# Patient Record
Sex: Female | Born: 1945 | Race: White | Hispanic: No
Health system: Southern US, Community
[De-identification: ages and names within clinical notes are randomized; demographics above are authoritative.]

## PROBLEM LIST (undated history)

## (undated) DIAGNOSIS — I4581 Long QT syndrome: Secondary | ICD-10-CM

## (undated) DIAGNOSIS — I10 Essential (primary) hypertension: Secondary | ICD-10-CM

## (undated) DIAGNOSIS — K219 Gastro-esophageal reflux disease without esophagitis: Secondary | ICD-10-CM

## (undated) DIAGNOSIS — I719 Aortic aneurysm of unspecified site, without rupture: Secondary | ICD-10-CM

## (undated) DIAGNOSIS — Z950 Presence of cardiac pacemaker: Secondary | ICD-10-CM

## (undated) DIAGNOSIS — E039 Hypothyroidism, unspecified: Secondary | ICD-10-CM

## (undated) DIAGNOSIS — I498 Other specified cardiac arrhythmias: Secondary | ICD-10-CM

## (undated) DIAGNOSIS — R001 Bradycardia, unspecified: Secondary | ICD-10-CM

## (undated) DIAGNOSIS — R7401 Elevation of levels of liver transaminase levels: Secondary | ICD-10-CM

## (undated) DIAGNOSIS — I099 Rheumatic heart disease, unspecified: Secondary | ICD-10-CM

## (undated) DIAGNOSIS — F419 Anxiety disorder, unspecified: Secondary | ICD-10-CM

## (undated) HISTORY — PX: CARDIAC ELECTROPHYSIOLOGY STUDY AND ABLATION: SHX1294

## (undated) HISTORY — DX: Gastro-esophageal reflux disease without esophagitis: K21.9

## (undated) HISTORY — DX: Elevation of levels of liver transaminase levels: R74.01

## (undated) HISTORY — PX: OOPHORECTOMY: SHX86

## (undated) HISTORY — PX: TONSILLECTOMY: SUR1361

## (undated) HISTORY — PX: SALPINGECTOMY: SHX328

## (undated) HISTORY — DX: Long QT syndrome: I45.81

## (undated) HISTORY — DX: Essential (primary) hypertension: I10

## (undated) HISTORY — DX: Hypothyroidism, unspecified: E03.9

## (undated) HISTORY — DX: Aortic aneurysm of unspecified site, without rupture: I71.9

## (undated) HISTORY — PX: CHOLECYSTECTOMY: SHX55

## (undated) HISTORY — DX: Other specified cardiac arrhythmias: I49.8

## (undated) HISTORY — DX: Bradycardia, unspecified: R00.1

## (undated) HISTORY — PX: APPENDECTOMY: SHX54

## (undated) HISTORY — DX: Rheumatic heart disease, unspecified: I09.9

## (undated) HISTORY — DX: Anxiety disorder, unspecified: F41.9

---

## 1999-10-20 HISTORY — PX: MITRAL VALVE REPLACEMENT: SHX147

## 2003-10-22 ENCOUNTER — Other Ambulatory Visit: Payer: Self-pay

## 2004-06-10 ENCOUNTER — Other Ambulatory Visit: Payer: Self-pay

## 2007-11-20 HISTORY — PX: PACEMAKER INSERTION: SHX728

## 2007-12-01 ENCOUNTER — Other Ambulatory Visit: Payer: Self-pay

## 2007-12-01 ENCOUNTER — Emergency Department: Payer: Self-pay | Admitting: Emergency Medicine

## 2007-12-13 ENCOUNTER — Inpatient Hospital Stay (HOSPITAL_COMMUNITY): Admission: EM | Admit: 2007-12-13 | Discharge: 2007-12-16 | Payer: Self-pay | Admitting: Emergency Medicine

## 2007-12-13 ENCOUNTER — Ambulatory Visit: Payer: Self-pay | Admitting: Cardiology

## 2007-12-14 ENCOUNTER — Encounter (INDEPENDENT_AMBULATORY_CARE_PROVIDER_SITE_OTHER): Payer: Self-pay | Admitting: Emergency Medicine

## 2007-12-21 ENCOUNTER — Ambulatory Visit: Payer: Self-pay | Admitting: Internal Medicine

## 2007-12-29 ENCOUNTER — Ambulatory Visit: Payer: Self-pay | Admitting: Internal Medicine

## 2008-01-31 ENCOUNTER — Ambulatory Visit: Payer: Self-pay | Admitting: Internal Medicine

## 2008-02-01 ENCOUNTER — Observation Stay (HOSPITAL_COMMUNITY): Admission: EM | Admit: 2008-02-01 | Discharge: 2008-02-03 | Payer: Self-pay | Admitting: Emergency Medicine

## 2008-02-01 ENCOUNTER — Ambulatory Visit: Payer: Self-pay | Admitting: Internal Medicine

## 2008-02-06 ENCOUNTER — Ambulatory Visit: Payer: Self-pay | Admitting: Internal Medicine

## 2008-02-08 ENCOUNTER — Ambulatory Visit: Payer: Self-pay

## 2008-02-10 ENCOUNTER — Ambulatory Visit: Payer: Self-pay | Admitting: Internal Medicine

## 2008-02-20 ENCOUNTER — Ambulatory Visit: Payer: Self-pay | Admitting: Internal Medicine

## 2008-07-09 ENCOUNTER — Ambulatory Visit: Payer: Self-pay | Admitting: Internal Medicine

## 2008-08-28 ENCOUNTER — Ambulatory Visit: Payer: Self-pay | Admitting: Internal Medicine

## 2008-08-28 LAB — CONVERTED CEMR LAB
Basophils Absolute: 0 10*3/uL (ref 0.0–0.1)
CO2: 31 meq/L (ref 19–32)
Calcium: 9.1 mg/dL (ref 8.4–10.5)
Chloride: 102 meq/L (ref 96–112)
Glucose, Bld: 82 mg/dL (ref 70–99)
Hemoglobin: 14 g/dL (ref 12.0–15.0)
INR: 2.8 — ABNORMAL HIGH (ref 0.8–1.0)
Lymphocytes Relative: 24.6 % (ref 12.0–46.0)
MCHC: 34.7 g/dL (ref 30.0–36.0)
Monocytes Relative: 9.6 % (ref 3.0–12.0)
Neutro Abs: 3.3 10*3/uL (ref 1.4–7.7)
Neutrophils Relative %: 65.6 % (ref 43.0–77.0)
Platelets: 151 10*3/uL (ref 150–400)
Potassium: 3.6 meq/L (ref 3.5–5.1)
Prothrombin Time: 29.9 s — ABNORMAL HIGH (ref 10.9–13.3)
RDW: 13.2 % (ref 11.5–14.6)
Sodium: 139 meq/L (ref 135–145)

## 2008-09-03 ENCOUNTER — Ambulatory Visit (HOSPITAL_COMMUNITY): Admission: RE | Admit: 2008-09-03 | Discharge: 2008-09-03 | Payer: Self-pay | Admitting: Internal Medicine

## 2008-09-03 ENCOUNTER — Encounter: Payer: Self-pay | Admitting: Cardiology

## 2008-09-03 ENCOUNTER — Ambulatory Visit: Payer: Self-pay | Admitting: Internal Medicine

## 2008-09-17 ENCOUNTER — Ambulatory Visit: Payer: Self-pay | Admitting: Internal Medicine

## 2008-10-19 DIAGNOSIS — Z45018 Encounter for adjustment and management of other part of cardiac pacemaker: Secondary | ICD-10-CM | POA: Insufficient documentation

## 2008-10-19 HISTORY — DX: Encounter for adjustment and management of other part of cardiac pacemaker: Z45.018

## 2008-11-28 ENCOUNTER — Encounter: Payer: Self-pay | Admitting: Internal Medicine

## 2010-06-16 ENCOUNTER — Encounter (INDEPENDENT_AMBULATORY_CARE_PROVIDER_SITE_OTHER): Payer: Self-pay | Admitting: *Deleted

## 2010-07-18 ENCOUNTER — Encounter: Payer: Self-pay | Admitting: Internal Medicine

## 2010-07-31 ENCOUNTER — Ambulatory Visit: Payer: Self-pay | Admitting: Internal Medicine

## 2010-07-31 ENCOUNTER — Encounter: Payer: Self-pay | Admitting: Cardiology

## 2010-07-31 DIAGNOSIS — I10 Essential (primary) hypertension: Secondary | ICD-10-CM

## 2010-07-31 DIAGNOSIS — Z950 Presence of cardiac pacemaker: Secondary | ICD-10-CM

## 2010-07-31 DIAGNOSIS — I509 Heart failure, unspecified: Secondary | ICD-10-CM | POA: Insufficient documentation

## 2010-07-31 DIAGNOSIS — R0602 Shortness of breath: Secondary | ICD-10-CM | POA: Insufficient documentation

## 2010-07-31 HISTORY — DX: Essential (primary) hypertension: I10

## 2010-07-31 HISTORY — DX: Heart failure, unspecified: I50.9

## 2010-07-31 HISTORY — DX: Presence of cardiac pacemaker: Z95.0

## 2010-07-31 HISTORY — DX: Shortness of breath: R06.02

## 2010-08-04 LAB — CONVERTED CEMR LAB
BUN: 9 mg/dL (ref 6–23)
Chloride: 102 meq/L (ref 96–112)
Glucose, Bld: 98 mg/dL (ref 70–99)
Potassium: 3.3 meq/L — ABNORMAL LOW (ref 3.5–5.3)
Pro B Natriuretic peptide (BNP): 35.1 pg/mL (ref 0.0–100.0)
Sodium: 140 meq/L (ref 135–145)
TSH: 2.906 microintl units/mL (ref 0.350–4.500)

## 2010-08-05 ENCOUNTER — Telehealth: Payer: Self-pay | Admitting: Internal Medicine

## 2010-08-14 ENCOUNTER — Encounter: Payer: Self-pay | Admitting: Internal Medicine

## 2010-08-14 ENCOUNTER — Ambulatory Visit: Payer: Self-pay

## 2010-10-30 ENCOUNTER — Telehealth (INDEPENDENT_AMBULATORY_CARE_PROVIDER_SITE_OTHER): Payer: Self-pay | Admitting: *Deleted

## 2010-11-13 ENCOUNTER — Ambulatory Visit: Admit: 2010-11-13 | Payer: Self-pay | Admitting: Internal Medicine

## 2010-11-18 NOTE — Progress Notes (Signed)
Summary: metoprolol difficulties   Phone Note Call from Patient   Caller: Patient Summary of Call: Patient could not tolerate the Metoprolol causing her to be really tired and could not get breath lying down, sitting down and just can't get comfortable.  She stopped the metoprolol and now put herself back on HCTZ.  BP reading 117/71 with heart rate of 74.  She can't tolerate the atenolol or metoprolol. What do you recommend? Initial call taken by: Bishop Dublin, CMA,  August 05, 2010 9:42 AM  Follow-up for Phone Call        try inderal la 60,  and if not tolerated try nadoldol 20 thanks steve Follow-up by: Nathen May, MD, [facility],  August 05, 2010 6:16 PM     Appended Document: metoprolol difficulties Notified patient need to start taking Inderal La 60 mg one tablet once daily.  She will go pick up at pharmacy.

## 2010-11-18 NOTE — Cardiovascular Report (Signed)
Summary: Certified Letter - Delivered  Certified Letter - Delivered   Imported By: Debby Freiberg 08/25/2010 14:30:53  _____________________________________________________________________  External Attachment:    Type:   Image     Comment:   External Document

## 2010-11-18 NOTE — Assessment & Plan Note (Signed)
Summary: 10:00 APPT/AMD  Medications Added WARFARIN SODIUM 7.5 MG TABS (WARFARIN SODIUM) take as directed AMBIEN 10 MG TABS (ZOLPIDEM TARTRATE) 1- 1 1/2 tablet once daily FUROSEMIDE 40 MG TABS (FUROSEMIDE) Take one tablet by mouth daily. METOPROLOL TARTRATE 25 MG TABS (METOPROLOL TARTRATE) Take one tablet by mouth twice a day      Allergies Added:   Visit Type:  Follow-up Primary Provider:  Dr. Laymond Purser  CC:  c/o shortness of breath.  .  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    She has been lost to followup.  And her old records from [facility] were reviewed. Echo cardiogram demonstrated normal prosthetic valve function in 2010.  She was seen last week again, no repeat echo was performed  Current Medications (verified): 1)  Levoxyl 50 Mcg Tabs (Levothyroxine Sodium) .Marland Kitchen.. 1 Tab By Mouth Once Daily 2)  Protonix 40 Mg Tbec (Pantoprazole Sodium) .Marland Kitchen.. 1 Tab By Mouth Once Daily 3)  Warfarin Sodium 7.5 Mg Tabs (Warfarin Sodium) .... Take As Directed 4)  Hydrochlorothiazide 25 Mg Tabs (Hydrochlorothiazide) .... Take One Tablet By Mouth Daily. 5)  Taztia Xt 300 Mg Xr24h-Cap (Diltiazem Hcl Er Beads) .Marland Kitchen.. 1 Tab By Mouth Once Daily 6)  Atacand 8 Mg Tabs (Candesartan Cilexetil) .... Take 1/2 Tab By Mouth Once Daily 7)  Klor-Con M20 20 Meq Cr-Tabs (Potassium Chloride Crys Cr) .Marland Kitchen.. 1 Tab By Mouth Once Daily 8)  Ambien 10 Mg Tabs (Zolpidem Tartrate) .Marland Kitchen.. 1- 1 1/2 Tablet Once Daily 9)  Furosemide 20 Mg Tabs (Furosemide) .... As Needed  Allergies (verified): 1)  ! Atenolol  Past History:  Past Medical History: Last updated: 12/10/2008 1. Rheumatic heart  disease with mitral valve replacement, tricuspid       regurgitation moderate-to-severe, significant left atrial       enlargement.  2. Atrial arrhythmias with a prior history of atrial flutter ablation  3. Hypertension.  4. Bradycardia status post pacemaker implantation.  5. Long QT.  6. Hypothyroidism  Past Surgical History: Last updated: 12/10/2008 Dual-chamber pacemaker implantation with rapid ventricular   pacing.  --- 2/09  Family History: Last updated: 12/10/2008 Really noncontributory to the patient's current medical   condition.   Vital Signs:  Patient profile:   65 year old female Height:      60 inches Weight:      152 pounds BMI:     29.79 Pulse rate:   94 / minute BP sitting:   138 / 74  (left arm) Cuff size:   regular  Vitals Entered By: Bishop Dublin, CMA (July 31, 2010 9:42 AM)  Physical Exam  General:  Well developed, well nourished,middle the older Caucasian female in no acute distress. Head:  normal HEENT Neck:  supple without thyromegaly; JVP 6-7 cm Chest Wall:  mild kyphosis with well-healed device pocket Lungs:  clear to auscultation Heart:  mechanical S1-2 to 3/6 systolic murmur no RV lift Abdomen:  soft nontender without hepatomegaly or HJR Msk:  Back normal, normal gait. Muscle strength and tone normal. Extremities:  no clubbing or cyanosis 1-2+ peripheral edema Neurologic:  grossly normal motor and sensory function Skin:  warm and dry Psych:  somewhat flat affect   PPM Specifications Following MD:  Sherryl Manges, MD     PPM Vendor:  [employer]     PPM Model Number:  ADDRL1     PPM Serial Number:  [id number] [facility] PPM DOI:  12/15/2007     PPM Implanting MD:  Sherryl Manges, MD  Lead 1    Location: RA     DOI: 12/15/2007     Model #: 4098     Serial #: [id number]     Status: active Lead 2    Location: RV     DOI: 12/15/2007     Model #: 6213     Serial #: [id number]     Status: active  Magnet Response Rate:  BOL 85 ERI  65  Indications:   Tachy-brady syndrome   PPM Follow Up Remote Check?  No Battery Voltage:  2.79 V     Battery Est. Longevity:  13.5 years     Pacer Dependent:  No       PPM Device Measurements Atrium  Amplitude: 2.0 mV, Impedance: 407 ohms,  Right Ventricle  Amplitude: 4.0 mV, Impedance: 388 ohms, Threshold: 0.625 V at 0.4 msec  Episodes MS Episodes:  270,754     Percent Mode Switch:  28.5%     Coumadin:  Yes Ventricular High Rate:  14     Atrial Pacing:  6.5%     Ventricular Pacing:  0.2%  Parameters Mode:  DDI     Lower Rate Limit:  60     Paced AV Delay:  240     Next Remote Date:  10/30/2010     Next Cardiology Appt Due:  07/20/2011 Tech Comments:  A-fib/flutter.  12 VHR episodes the longest 4:43 minutes, + coumadin.  Carelink transmissions every 3 months.  ROV 1 year with Dr. Graciela Husbands. Altha Harm, LPN  July 31, 2010 10:19 AM   Impression & Recommendations:  Problem # 1:  SHORTNESS OF BREATH (ICD-786.05) the patient had shortness of breath in the context of mitral valve disease previously normal left ventricular function and atrial arrhythmias with a with somewhat rapid ventricular response as detected by her device with a median heart rate of about 95. It is not clear to me whether this is probably congestive are not. She also suffers from obesity. It is likely multifactorial.  I will plan to increase her diuretics based on evidence of volume overload. We will check her BNP. We will also repeat her ultrasound looking for both evidence of right ventricular dysfunction left ventricular dysfunction Her updated medication list for this problem includes:    Hydrochlorothiazide 25 Mg Tabs (Hydrochlorothiazide) .Marland Kitchen... Take one tablet by mouth daily.    Taztia Xt 300 Mg Xr24h-cap (Diltiazem hcl er beads) .Marland Kitchen... 1 tab by mouth once daily    Atacand 8 Mg Tabs (Candesartan cilexetil) .Marland Kitchen... Take 1/2 tab by mouth once daily    Furosemide 40 Mg Tabs (Furosemide) .Marland Kitchen... Take one tablet by mouth daily.     Metoprolol Tartrate 25 Mg Tabs (Metoprolol tartrate) .Marland Kitchen... Take one tablet by mouth twice a day  Orders: Echocardiogram (Echo)  Her updated medication list for this problem includes:    Hydrochlorothiazide 25 Mg Tabs (Hydrochlorothiazide) .Marland Kitchen... Take one tablet by mouth daily.    Taztia Xt 300 Mg Xr24h-cap (Diltiazem hcl er beads) .Marland Kitchen... 1 tab by mouth once daily    Atacand 8 Mg Tabs (Candesartan cilexetil) .Marland Kitchen... Take 1/2 tab by mouth once daily    Furosemide 40 Mg Tabs (Furosemide) .Marland Kitchen... Take one tablet by mouth daily.  Metoprolol Tartrate 25 Mg Tabs (Metoprolol tartrate) .Marland Kitchen... Take one tablet by mouth twice a day  Problem # 2:  CONGESTIVE HEART FAILURE (ICD-428.0) as above.   Her updated medication list for this problem includes:    Warfarin Sodium 7.5 Mg Tabs (Warfarin sodium) .Marland Kitchen... Take as directed    Hydrochlorothiazide 25 Mg Tabs (Hydrochlorothiazide) .Marland Kitchen... Take one tablet by mouth daily.    Taztia Xt 300 Mg Xr24h-cap (Diltiazem hcl er beads) .Marland Kitchen... 1 tab by mouth once daily    Atacand 8 Mg Tabs (Candesartan cilexetil) .Marland Kitchen... Take 1/2 tab by mouth once daily    Furosemide 40 Mg Tabs (Furosemide) .Marland Kitchen... Take one tablet by mouth daily.    Metoprolol Tartrate 25 Mg Tabs (Metoprolol tartrate) .Marland Kitchen... 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 The first is can we further control heart rate. To that and we'll add metoprolol. I reviewed with her extensively her atenolol "allergy" history. It sounds like it was related to bradycardia which is now controlled by her pacemaker  The second is whether my continued for cardiomyopathy given the median heart rate in the high 90s. We'll check the echo as noted. The 30s who is whether she would be a candidate for ablation of the arrhythmia. She also has a history of atrial fibrillation. It may be worth having her seen by the with the team at  [facility] Her updated medication list for this problem includes:    Warfarin Sodium 7.5 Mg Tabs (Warfarin sodium) .Marland Kitchen... Take as directed    Metoprolol Tartrate 25 Mg Tabs (Metoprolol tartrate) .Marland Kitchen... 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 Take as directed    Taztia Xt 300 Mg Xr24h-cap (Diltiazem hcl er beads) .Marland Kitchen... 1 tab by mouth once daily    Metoprolol Tartrate 25 Mg Tabs (Metoprolol tartrate) .Marland Kitchen... Take one tablet by mouth twice a day  Other Orders: T-Basic Metabolic Panel [phone])  Patient Instructions: 1)  Your physician recommends that you have  lab work today:(BNP/BMP/TSH) 2)  Your physician has recommended you make the following change in your medication: INCREASE furosemide 40mg  daily, START metoprolol 25mg  two times a day  3)  Your physician has requested that you have an echocardiogram.  Echocardiography is a painless test that uses sound waves to create images of your heart. It provides your doctor with information about the size and shape of your heart and how well your heart's chambers and valves are working.  This procedure takes approximately one hour. There are no restrictions for this procedure. Prescriptions: METOPROLOL TARTRATE 25 MG TABS (METOPROLOL TARTRATE) Take one tablet by mouth twice a day  #60 x 6   Entered by:   Benedict Needy, RN   Authorized by:   Nathen May, MD, [facility]   Signed by:   Benedict Needy, RN on 07/31/2010   Method used:   Electronically to        [facility]  [address] [phone]* (retail)  [address]       [place], [place]   [address]       Ph: [phone] or [phone]       Fax: [phone]   RxID:   [id number] FUROSEMIDE 40 MG TABS (FUROSEMIDE) Take one tablet by mouth daily.  #30 x 6   Entered by:   Benedict Needy, RN   Authorized by:   Nathen May, MD, [facility]   Signed by:   Benedict Needy, RN on 07/31/2010   Method used:   Electronically to        [facility]  [address] [phone]* (retail)       [address]       [place], [place]  [address]       Ph: [phone] or [phone]       Fax: [phone]   RxID:   [id number]

## 2010-11-18 NOTE — Letter (Signed)
Summary: Device-Delinquent Check  [place]  [address], [place] [address]   Phone: [phone]  Fax: [phone]     June 16, 2010 MRN: [id number]   [facility] [address] [place], [place]  [address]   Dear Ms. Finck,  According to our records, you have not had your implanted device checked in the recommended period of time.  We are unable to determine appropriate device function without checking your device on a regular basis.  Please call our office to schedule an appointment, with Dr. Graciela Husbands,  as soon as possible.  If you are having your device checked by another physician, please call us so that we may update our records.  Thank you,  Altha Harm, LPN  June 16, 2010 4:21 PM  [facility]  certified mail

## 2010-11-19 ENCOUNTER — Telehealth: Payer: Self-pay | Admitting: Internal Medicine

## 2010-11-19 ENCOUNTER — Encounter: Payer: Self-pay | Admitting: Internal Medicine

## 2010-11-19 ENCOUNTER — Encounter (INDEPENDENT_AMBULATORY_CARE_PROVIDER_SITE_OTHER): Payer: PRIVATE HEALTH INSURANCE

## 2010-11-19 DIAGNOSIS — I495 Sick sinus syndrome: Secondary | ICD-10-CM

## 2010-11-20 NOTE — Progress Notes (Signed)
Summary: pacer check   Phone Note Call from Patient Call back at Home Phone [phone]   Caller: Patient Call For: Gunnar Fusi Summary of Call: pt never received her machine to check her pacer over the phone. She recently had a message on her machine wanting her to check her pacer but never received anything through the mail. please advise. Initial call taken by: Lysbeth Galas CMA,  October 30, 2010 3:37 PM  Follow-up for Phone Call        Transmitter reordered and appt. rescheduled 10/14/11, patient aware. Follow-up by: Altha Harm, LPN,  October 30, 2010 5:08 PM

## 2010-11-26 NOTE — Progress Notes (Signed)
Summary: pt has problems with bp   Phone Note Call from Patient   Caller: Patient [phone] Reason for Call: Talk to Nurse Summary of Call: pt having problems with bp Initial call taken by: Glynda Jaeger,  November 19, 2010 10:34 AM  Follow-up for Phone Call        Pt. states she went to see her PCP yesterday for high B/P. Pt's PCP recommended to take one Diltaizem 120 mg one time only. Pt's B/P went down to 90/59. According to pt. she was taken DiltAZEM 120 MG ONCE A DAY. Dr. Graciela Husbands d/c it because medication was making her heart rate to be low. Pt. states also her PCP mention to her that her intrinsic heart rate is low, and the pacer is pacing. Pt. states her heart rate sometime goes up to 80 or 90 beats/min now that she is not taken Ditiazem dailty. Pt. also sates she received a transmitter for her pacer. She does not know how to use it. Christy at [facility] recommneded for pt. to call [facility] for instructions how to use it at [phone]. Pt. aware. Follow-up by: Ollen Gross, RN, BSN,  November 19, 2010 11:30 AM

## 2010-12-04 ENCOUNTER — Telehealth: Payer: Self-pay | Admitting: Internal Medicine

## 2010-12-16 ENCOUNTER — Encounter: Payer: PRIVATE HEALTH INSURANCE | Admitting: Internal Medicine

## 2010-12-16 NOTE — Cardiovascular Report (Signed)
Summary: Office Visit Remote   Office Visit Remote   Imported By: Roderic Ovens 12/12/2010 11:18:57  _____________________________________________________________________  External Attachment:    Type:   Image     Comment:   External Document

## 2010-12-23 ENCOUNTER — Encounter: Payer: Self-pay | Admitting: Internal Medicine

## 2010-12-23 ENCOUNTER — Encounter (INDEPENDENT_AMBULATORY_CARE_PROVIDER_SITE_OTHER): Payer: Medicare Other | Admitting: Internal Medicine

## 2010-12-23 DIAGNOSIS — R0602 Shortness of breath: Secondary | ICD-10-CM

## 2010-12-23 DIAGNOSIS — I079 Rheumatic tricuspid valve disease, unspecified: Secondary | ICD-10-CM | POA: Insufficient documentation

## 2010-12-23 DIAGNOSIS — I4892 Unspecified atrial flutter: Secondary | ICD-10-CM

## 2010-12-23 DIAGNOSIS — I495 Sick sinus syndrome: Secondary | ICD-10-CM

## 2010-12-23 HISTORY — DX: Rheumatic tricuspid valve disease, unspecified: I07.9

## 2010-12-30 NOTE — Assessment & Plan Note (Signed)
Summary: PACER/Per Lela/AMD  Medications Added FUROSEMIDE 40 MG TABS (FUROSEMIDE) Take one tablet by mouth daily,prn      Allergies Added:   Visit Type:  Follow-up Primary Provider:  Dr. Laymond Purser  CC:  c/o fatigue in afternoon and back pain. 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  She has had a prior atrial flutter ablation with recurrent atrial arrhythmias ; recent echo (2011) demonstrated normal left ventricular function with now normal (as compared to moderate reduction) RV function and new moderate to severe tricuspid regurgitation..    She also had evidence at her last visit of atrial tachycardia for which beta blockers were prescribed. The previously identified "atenolol allergy" was apparently related to bradycardia.  , she simply spoke to her doctor at [facility]. This was discontinued. She remained on diltiazem. About 2 months ago she noted that her heart rate was slower. She was concerned.  Blood work at that visit showed K. 3.3 mEq/L         TSH    2.906 uIU/mL           B Natriuretic peptice 35.1 pg/mL                Current Medications (verified): 1)  Levoxyl 50 Mcg Tabs (Levothyroxine Sodium) .Marland Kitchen.. 1 Tab By Mouth Once Daily 2)  Protonix 40 Mg Tbec (Pantoprazole Sodium) .Marland Kitchen.. 1 Tab By Mouth Once Daily 3)  Warfarin Sodium 7.5 Mg Tabs (Warfarin Sodium) .... Take As Directed 4)  Hydrochlorothiazide 25 Mg Tabs (Hydrochlorothiazide) .... Take One Tablet By Mouth Daily. 5)  Atacand 8 Mg Tabs (Candesartan Cilexetil) .... Take 1/2 Tab By Mouth Once Daily 6)  Klor-Con M20 20 Meq Cr-Tabs (Potassium Chloride Crys Cr) .Marland Kitchen.. 1 Tab By Mouth Once Daily 7)  Ambien 10 Mg Tabs (Zolpidem Tartrate) .Marland Kitchen.. 1- 1 1/2 Tablet Once Daily 8)  Furosemide 40  Mg Tabs (Furosemide) .... Take One Tablet By Mouth Daily,prn  Allergies (verified): 1)  ! Atenolol  Past History:  Past Medical History: Last updated: 12/10/2008 1. Rheumatic heart disease with mitral valve replacement, tricuspid       regurgitation moderate-to-severe, significant left atrial       enlargement.  2. Atrial arrhythmias with a prior history of atrial flutter ablation  3. Hypertension.  4. Bradycardia status post pacemaker implantation.  5. Long QT.  6. Hypothyroidism  Past Surgical History: Last updated: 12/10/2008 Dual-chamber pacemaker implantation with rapid ventricular   pacing.  --- 2/09  Family History: Last updated: 12/10/2008 Really noncontributory to the patient's current medical   condition.   Vital Signs:  Patient profile:   65 year old female Height:      60 inches Weight:      149.75 pounds BMI:     29.35 Pulse rate:   68 / minute BP sitting:   128 / 68  (left arm) Cuff size:   large  Vitals Entered By: Lysbeth Galas CMA (December 23, 2010 9:28 AM)  Physical Exam  General:  The patient was alert and oriented in no acute distress. HEENT Normal.  Neck veins were flat, carotids were brisk.  Lungs were clear.  Heart sounds were regular with a total of 6 murmur at the left lower sternal border Abdomen was soft with active bowel  sounds. There is no clubbing cyanosis or edema. Skin Warm and dry    PPM Specifications Following MD:  Sherryl Manges, MD     PPM Vendor:  [employer]     PPM Model Number:  ADDRL1     PPM Serial Number:  [id number] PPM DOI:  12/15/2007     PPM Implanting MD:  Sherryl Manges, MD  Lead 1    Location: RA     DOI: 12/15/2007     Model #: 4098     Serial #: [id number]     Status: active Lead 2    Location: RV     DOI: 12/15/2007     Model #: 6213     Serial #: [id number]     Status: active  Magnet Response Rate:  BOL 85 ERI  65  Indications:  Tachy-brady syndrome   PPM Follow Up Pacer Dependent:  No       Episodes Coumadin:  Yes  Parameters Mode:  DDI     Lower Rate Limit:  60     Paced AV Delay:  240     Impression & Recommendations:  Problem # 1:  ATRIAL TACHYCARDIA   CL 320 MS (ICD-427.32) Or atrial tachycardia reverted to sinus rhythm about 2 months ago. She is holding sinus rhythm at this time.  she never consummated the beta blockers as she spoke to her physician at [facility]. I've asked her to follow up with him primarily for these issues as it very difficult to be involved in her multiple physicians engaged in the same problem management  The following medications were removed from the medication list:    Inderal La 60 Mg Xr24h-cap (Propranolol hcl) ..... One tablet once daily Her updated medication list for this problem includes:    Warfarin Sodium 7.5 Mg Tabs (Warfarin sodium) .Marland Kitchen... Take as directed  Problem # 2:  MITRAL STENOSIS, RHEUMATIC S/P VALVE REPLACEMENT (ICD-394.0) this is stable  Problem # 3:  SHORTNESS OF BREATH (ICD-786.05) This is relatively stable. The following medications were removed from the medication list:    Taztia Xt 300 Mg Xr24h-cap (Diltiazem hcl er beads) .Marland Kitchen... 1 tab by mouth once daily    Inderal La 60 Mg Xr24h-cap (Propranolol hcl) ..... One tablet once daily Her updated medication list for this problem includes:    Hydrochlorothiazide 25 Mg Tabs (Hydrochlorothiazide) .Marland Kitchen... Take one tablet by mouth daily.    Atacand 8 Mg Tabs (Candesartan cilexetil) .Marland Kitchen... Take 1/2 tab by mouth once daily    Furosemide 40 Mg Tabs (Furosemide) .Marland Kitchen... Take one tablet by mouth daily,prn  Problem # 4:  TRICUSPID REGURGITATION (ICD-397.0) This appears to be worse. Her right ventricular function appears to be better. It sounds like there are potentially 2 mechanisms contributing to this, the first woul pulmonary pressure issues and right ventricular dysfunction previously identified and now improved as well as the pacemaker lead. We will give the patient a copy of her echo. She'll  be following up with her physician at [facility]. The following medications were removed from the medication list:    Inderal La 60 Mg Xr24h-cap (Propranolol hcl) ..... One tablet once daily Her updated medication list for this problem includes:    Hydrochlorothiazide 25 Mg Tabs (Hydrochlorothiazide) .Marland Kitchen... Take one tablet by mouth daily.    Atacand 8 Mg Tabs (Candesartan cilexetil) .Marland Kitchen... Take 1/2 tab by mouth once daily    Furosemide 40 Mg Tabs (Furosemide) .Marland Kitchen... Take one tablet by mouth daily,prn  Problem # 5:  BRADYCARDIA (ICD-427.81) she is pacing currently at her lower rate limit in the atrium. She does not paced in the ventricle. The following medications were removed from the medication list:    Taztia Xt 300 Mg Xr24h-cap (Diltiazem hcl er beads) .Marland Kitchen... 1 tab by mouth once daily    Inderal La 60 Mg Xr24h-cap (Propranolol hcl) ..... One tablet once daily Her updated medication list for this problem includes:    Warfarin Sodium 7.5 Mg Tabs (Warfarin sodium) .Marland Kitchen... Take as directed  Problem # 6:  PACEMAKER, PERMANENT (ICD-V45.01) Device parameters and data were reviewed and no changes were made  Problem # 7:  UNSPECIFIED ESSENTIAL HYPERTENSION (ICD-401.9) BO well contorlled on atacacne and diuretics  The following medications were removed from the medication list:    Taztia Xt 300 Mg Xr24h-cap (Diltiazem hcl er beads) .Marland Kitchen... 1 tab by mouth once daily    Inderal La 60 Mg Xr24h-cap (Propranolol hcl) ..... One tablet once daily Her updated medication list for this problem includes:    Hydrochlorothiazide 25 Mg Tabs (Hydrochlorothiazide) .Marland Kitchen... Take one tablet by mouth daily.    Atacand 8 Mg Tabs (Candesartan cilexetil) .Marland Kitchen... Take 1/2 tab by mouth once daily    Furosemide 40 Mg Tabs (Furosemide) .Marland Kitchen... Take one tablet by mouth daily,prn  Patient Instructions: 1)  Your physician recommends that you schedule a follow-up appointment in: 1 year 2)  Your physician recommends that you continue on your  current medications as directed. Please refer to the Current Medication list given to you today.

## 2010-12-30 NOTE — Progress Notes (Signed)
Summary: Pacemaker   Phone Note Call from Patient Call back at Home Phone [phone]   Caller: Self Call For: [name] [name] Summary of Call: Pt states that her pulse is running at the speed that her pacemaker is set for.  She has questions about whether or not this is okay and is questioning whether this will cause the battery to burn out faster. Initial call taken by: Harlon Flor,  December 04, 2010 2:11 PM  Follow-up for Phone Call        left message for patient to call us . Follow-up by: Altha Harm, LPN,  December 05, 2010 8:31 AM

## 2011-03-03 NOTE — Assessment & Plan Note (Signed)
[facility]                                 ON-CALL NOTE   NAME:[name] [name], [name] [name]                     MRN:          [id number]  DATE:12/19/2007                            DOB:          1946-06-17    This dictation was cancelled by the dictator, as it was dictated on the  wrong patient.   PRIMARY CARDIOLOGIST:  Jesse Sans. Daleen Squibb, MD, [facility]   ELECTROPHYSIOLOGIST:  Duke Salvia, MD, [facility]   ATTENDING PHYSICIAN TODAY:  Bevelyn Buckles. Bensimhon, MD   I received a phone call from [name] [name] at [phone] secondary to  hypertension.  The patient normally takes Tiazac along with Diovan HCTZ  160/12.5 mg.  She stats that her blood pressure was fine and had been  doing well with the exception of recent admission to hospital in  [place].  At that time the patient was started on Tiazac at home.  She  also states that they did a number of tests at [facility] to include a  cardiac stress test and multiple labs, all of  which had come back  negative.  The patient was recently discharged approximately 3 days  prior to this phone call.   The patient has noticed despite taking her medications that her  hypertension remained, and she took her blood pressure prior to calling  secondary to some lightheadedness and dizziness, and was found to be  elevated at 185/105.  When I spoke with the patient, I asked her to  retake it and it was 179/102.  I went over other mediations with the  patient.  She states that she has multiple doses of Diovan 320/25, she  has 80/12.5, she has 160/12.5 and 150 mg, all of Diovan.  The patient  also took a dose of her Tiazac as well this morning.   I advised the patient to take a dose of 80/12.5 mg of Diovan this  evening and to rest tonight.  If her blood pressure remained elevated,  she is to give me a call back in approximately 3 hours.  The patient  also is taking Coumadin and I advised the patient to please call me back  if her blood  pressure remains elevated secondary to concerns about  bleeding with an elevated blood pressure.  She verbalized understanding.  The patient also is to have a follow-up appointment previously scheduled  on March 9 with Dr. Daleen Squibb.  I have called the office to leave a message  to have them call her to reschedule that appointment to be seen sooner  than later if her blood pressure remains elevated.   CANCELLED DICTATION      Bettey Mare. Lyman Bishop, NP  Electronically Signed      Bevelyn Buckles. Bensimhon, MD  Electronically Signed   KML/MedQ  DD: 12/19/2007  DT: 12/19/2007  Job #: [id number]

## 2011-03-03 NOTE — H&P (Signed)
NAME:  [name] [name], [name] [name]            ACCOUNT NO.:  [id number]   MEDICAL RECORD NO.:  [id number]          PATIENT TYPE:  INP   LOCATION:  6525                         FACILITY:  [facility]   PHYSICIAN:  Bevelyn Buckles. Bensimhon, MDDATE OF BIRTH:  03-26-1946   DATE OF ADMISSION:  02/01/2008  DATE OF DISCHARGE:                              HISTORY & PHYSICAL   PRIMARY CARE PHYSICIAN:  Dr. Maris Berger.   HISTORY:  Ms. Dymek is a 65 year old white female who called our  service at approximately 7 p.m. tonight secondary to tachypalpitations  associated with lightheadedness respectively.  She states she did not  take her diltiazem yesterday as prescribed.  She did see Dr. Graciela Husbands in  the office where he interrogated her pacemaker, which showed episodes of  most likely atrial tachycardias.  Her devices reprogrammed with a mode  switch at 140 with plans to track this and make a decision whether to  pursue further drug therapy or catheter ablation.  However since leaving  the office, the patient has described increased tachypalpitation which  had worsened throughout this afternoon.  She describes frequent skipping  at the beat, heart rate that goes really fast.  She began checking her  blood pressure.  It seems around 5 o'clock or 6 o'clock and her blood  pressure by a wrist device was reading approximately 220/120.  She  states that her heart rate has varied between the 70s and 140s.  With  this episode, she becomes very anxious and feels some lightheaded.   When I spoke with her on the phone, I asked that her blood pressure  device is ever being corroborated with a manual method.  She says that  no, but her blood pressure was never been that high before.  She was not  having any neurological deficits over the phone and I suggested 2  options, either that she comes to emergency room for further evaluation,  or that she take an additional Tiazac this evening.  Recheck her blood  pressure and pulse  with in an hour or two and if they had not improved  that she would need to come to the emergency room for further  evaluation.  I suggested the EMS for transport.  Apparently, the patient  drove to the hospital and prior to coming to the hospital she did take  an extra Tiazac.  On arrival to the emergency room, her blood pressure  is like 150/80 and pulse was 60.  While hooked up to the monitor in 3  hours, she continued to complaint of skipping beat, however, telemetry  continued to show normal sinus rhythm.  She was very concerned because  she did not understand why her pacemaker was not controlling the fast  heartbeat.  She was also concerned about the palpation in her neck that  her clothes when brushing her abdomen caused irritation, increased gas,  multiple general concerns.   PAST MEDICAL HISTORY:  Allergies include ATENOLOL with dystonic  reaction.   MEDICATIONS PRIOR TO ADMISSION:  1. Tiazac 240 mg daily.  2. Hydrochlorothiazide 25 daily.  3. Levothyroxine 50 mcg daily.  4. Ambien 10 nightly.  5. Protonix 40 mg nightly.  6. Clarinex 5 mg as needed.  7. Darvocet as needed.  8. Coumadin 5 mg 1-1/2 tablet daily.   Her history is notable for rheumatic heart disease and status post St.  Jude mitral valve replacement in 2002.  Her last echocardiogram on  December 14, 2007, showed EF of 55% to 65% without wall motion  abnormality, left atrial enlargement, moderate-to-severe TR.  She has a  history of brady-tachy, SVT, atrial fibrillation and atrial flutter.  She underwent an atrial flutter ablation in 2002, shows that the history  of hyperthyroidism.  Last TSH in February 2009, was within normal  limits.  She also has the history of hypertension, GERD.  She had a  Medtronic adaptor DDD pacer placed in 2009 by Dr. Graciela Husbands.   SOCIAL HISTORY:  She lives in [place] alone.  She is here with her  boyfriend.  She has 2 daughters.  She is a retired Neurosurgeon, on  disability.  Denies  tobacco, alcohol, drugs, specific diet or exercise  program.   FAMILY HISTORY:  Remained unchanged.   REVIEW OF SYSTEMS:  As above.  Other points are negative.   PHYSICAL EXAMINATION:  GENERAL:  Well-nourished, well-developed,  pleasant white female in no apparent distress.  VITAL SIGNS:  Blood pressure manually by myself was 146/70, her device,  right after I took her blood pressure, read 123/66, pulse was 60 and  regular, respirations 20 and regular.  HEENT:  Unremarkable.  NECK:  Supple without thyromegaly, adenopathy, JVD, or carotid bruits.  She does have a prominent carotid impulse.  HEART:  PMI not displaced.  Regular rate and rhythm.  Normal S1 and S2.  She does have a significant 2/3 TR murmur.  All pulses are symmetric and  intact without abdominal or femoral bruits.  LUNGS:  Clear to auscultation.  SKIN:  Integument.  ABDOMEN:  Soft.  Bowel sounds present without organomegaly, masses, or  tenderness.  EXTREMITIES:  Unremarkable.  MUSCULOSKELETAL AND NEURO:  Unremarkable.   Labs are pending.  EKG in the emergency room shows normal sinus rhythm,  possible LVH.  No old EKGs are available for comparison.   IMPRESSION:  1. Tachypalpitations associated with lightheadedness.  2. Possible hypertension.  However, unclear of the accuracy of her      home monitoring device.  3. Anxiety.  4. Possible port inside, into her cardiac history.   DISPOSITION:  Dr. Gala Romney reviewed the patient's history and spoke  with and examined the patient and agrees with the above.  We will bring  her in for observation for further evidence of tachypalpitations with  known history of SVT.  We will increase her Tiazac to 360 mg daily and  continue her other medications.  While she is in the emergency room, Dr.  Gala Romney takes considerable time, reassuring the patient in regards to  different types of dysrhythmias, her tricuspid regurgitation murmur and  why she has a prominent carotid  pulsations and tried to  reassure her in regards to her multiple general questions and concerns.  While she is in the hospital, we will try to also do some additional  teachings that she may have a better insight.  Dr. Graciela Husbands will review in  the morning to make further recommendations.      Joellyn Rued, PA-C      Bevelyn Buckles. Bensimhon, MD  Electronically Signed    EW/MEDQ  D:  02/01/2008  T:  02/02/2008  Job:  [id number]   cc:   Duke Salvia, MD, [facility]  Madolyn Frieze Jens Som, MD, [facility]  Rowena Sistasis

## 2011-03-03 NOTE — Assessment & Plan Note (Signed)
[facility]                         ELECTROPHYSIOLOGY OFFICE NOTE   NAME:Correa, Nadirah                     MRN:          [id number]  DATE:09/17/2008                            DOB:          [birth year]    Mrs. Giammona is seen following a recent cardioversion.  Unfortunately,  she did not hold sinus rhythm.   She has a history of rheumatic heart disease with biatrial enlargement  status post mitral valve replacement and her PACs have been quite  symptomatic.  She was on amiodarone in the past.  She underwent flutter  ablation in the past and this was done prior to [place] having known her.  I  do not know the details of this procedure, I think it was done at [facility].   In any case, she is quite symptomatic.  Her heart rate has been much  better controlled on the Tiazac.  She is concerned that her Atacand has  been associated with increasing frequency of her palpitations.   Her medications include Atacand 4, Tiazac 300, hydrochlorothiazide 25,  Protonix 40, Levoxyl, and potassium.   On examination today, her blood pressure is well controlled at 130/70,  her pulse was 72, her weight was 149.  Her lungs were clear.  Her heart  sounds were regular with a crisp mechanical S1.  The abdomen was soft.  The extremities had trace edema.   Interrogation of her pacemaker demonstrated she is in atrial  fibrillation about 30% of the time with a controlled ventricular  response.   Review of her echo demonstrated significant biatrial enlargement with an  LA dimension of 48 and the RA described as normal but with moderate-to-  severe tricuspid regurgitation in her echo at the time of her pacemaker  implantation.   IMPRESSION:  1. Rheumatic heart disease with mitral valve replacement, tricuspid      regurgitation moderate-to-severe, significant left atrial      enlargement.  2. Atrial arrhythmias with a prior history of atrial flutter ablation      at [facility]  now with recurrent atrial arrhythmias - symptomatic.  3. Hypertension.  4. Bradycardia status post pacemaker implantation.  5. Long QT.   Mrs. Klare has moderately symptomatic palpitations.  She has had a  prior atrial flutter ablation.  Her mechanism of her tachypalpitations  is not entirely clear, but with her atriapathy, I am  less sanguine that  we are going to be able to eliminate her atrial arrhythmias with a  catheter ablation.  Hence my initial approach would be to consider  antiarrhythmic drug therapy.  With QT prolongation, she is not a  candidate for Tikosyn.  With her atriapathy, I do not think she will be  candidate for anything else besides the amiodarone as I am not a big fan  of dronedarone at this point still.   We have reviewed the above.  She thinks that the Atacand may be  aggravating her palpitations and so we will discontinue the Atacand for  2 weeks and see what impact that has on her symptoms.  In the event that  there are none,  we will resume the Atacand to begin her on amiodarone at  200 mg twice daily, check her amiodarone labs including PFTs TSH, LFTs,  and plan to stop her Tiazac.   I will see her again in 6 weeks' time.     Duke Salvia, MD, [facility]  Electronically Signed    SCK/MedQ  DD: 09/17/2008  DT: 09/18/2008  Job #: [id number]

## 2011-03-03 NOTE — Assessment & Plan Note (Signed)
[facility]                         ELECTROPHYSIOLOGY OFFICE NOTE   NAME:[name] [name], [name] [name]                     MRN:          [id number]  DATE:02/20/2008                            DOB:          [birth year]    HISTORY OF PRESENT ILLNESS:  Ms. Minnis continues to have a variety of  complaints, the first is dyspnea which she thinks is now more related to  abdominal bloating and she is taking some gas pills with resolution.  She has had no significant peripheral edema.   She has had some spells also where she feels like she is shaking.  This  turned out to correlate in time with an episode of atrial fibrillation  detected by her monitor.  She had eaten some crackers and got better.  She wonders whether she is hypoglycemic.  There is a strong family  history of diabetes.   She has occasional peripheral edema.   MEDICATIONS:  1. Furosemide p.r.n.  2. Coumadin 5.  3. Synthroid 50.  4. Cardizem 240.  5. Prilosec.  6. HCTZ 25.  7. Ambien.  8. Potassium.   PHYSICAL EXAMINATION:  VITAL SIGNS:  Blood pressure 144/80, pulse 70.  LUNGS:  Clear.  HEART:  Sounds were regular with mechanical S1.  EXTREMITIES:  Without edema.   Interrogation of her pacemaker was utilized to try to identify  correlations less with the above.  Notably also, she is in sinus rhythm  at 99.9+ percent of the time.   IMPRESSION:  1. Dyspnea, probably noncardiac, improved with the use of de-gas      medications.  2. Atrial fibrillation, paroxysmal, with some degree of rapid      ventricular response.  3. Status post mechanical mitral valve.  4. Normal left ventricular function by Myoview scanning.  I am not      impressed that Ms. Slates has cardiac dyspnea.  There is no      evidence of right-sided volume overload, so I think the      likely contribution of pericardial fluid is sufficiently small.  At      this point, we will not pursue an      echo.  We will see her  again in one months' time to see if we can      do any further correlations between her arrhythmia and her symptoms      and re-program her device.     Duke Salvia, MD, [facility]  Electronically Signed    SCK/MedQ  DD: 02/20/2008  DT: 02/20/2008  Job #: [id number]

## 2011-03-03 NOTE — Discharge Summary (Signed)
NAME:  [name] [name], [name] [name]            ACCOUNT NO.:  [id number]   MEDICAL RECORD NO.:  [id number]          PATIENT TYPE:  OBV   LOCATION:  6525                         FACILITY:  [facility]   PHYSICIAN:  Maple Mirza, PA   DATE OF BIRTH:  09-09-1946   DATE OF ADMISSION:  02/01/2008  DATE OF DISCHARGE:  02/03/2008                               DISCHARGE SUMMARY   ALLERGIES:  SHE HAS ANAPHYLAXIS WITH ATENOLOL   FINAL DIAGNOSIS:  1. Admitted with tachy palpitations/lightheadedness.  2. Recurrent atrial tachycardia, paroxysmal, symptomatic.  3. Medtronic Adapta dual-chamber pacemaker implanted February 2009      with reprogramming from AAIR to DDDR to DDIR this admission.  4. Relative hypokalemia on admission.  Hydrochlorothiazide switched to      Maxzide at discharge.  5. History of fevers and flushing and chills.  Blood cultures sent and      they are pending.  However, the preliminary report shows negative      growth to date from over a 48-hour.  6. Active complaint of chronic abdominal pain with concurrent      constipation.  The patient also complains of abdominal bloating and      persistent gas formation.  The patient is on Protonix.   SECONDARY DIAGNOSES:  1. History of atrial arrhythmias.      a.     Atrial flutter ablated 2002.      b.     Paroxysmal atrial fibrillation.      c.     Long RP tachycardia noted in EP lab during pacemaker       implantation.      d.     Rate and rhythm control on Cardizem 360 mg daily.  2. History of rheumatic heart disease status post mitral valve      replacement, St. Jude mitral valve 2002 on chronic Coumadin      therapy.  3. Medtronic Adapta dual-chamber pacemaker implanted for tachybrady      syndrome/SVT February 2009.  4. Hypertension.  5. Treated hypothyroidism.  6 . Abdominal bloating and gas formation.  1. Chronic constipation bowel movements two to three times a week.  2. Insomnia.  The patient has been on Ambien for the last 6  years.  3. Moderate to severe tricuspid regurgitation   PROCEDURES:  1. No procedures this admission except for reprogramming the patient's      pacemaker.  The AV response was activated to address a very      prolonged PR delay.  This occurs with modest exertion secondary to      decremental AV conduction.  2. Blood cultures were sent.  The patient complaining of hot flashes,      night sweats and chills.  Preliminary culture results negative      after 48 hours.  The patient has been afebrile this admission.   BRIEF HISTORY:  Ms. [name] [name] is a 65 year old female admitted  April 15 complaining of tachy palpitation and lightheadedness.  For the  24-hour period prior to this complaint, she had not taken her diltiazem  having run out of it.  She has a history of atrial arrhythmias and is  possibly experiencing these.  She did see Dr. Graciela Husbands in the office  recently.  The device was interrogated and reprogrammed at that time.  However, her symptoms have become worse through the day preceding this  admission.  The patient's diltiazem was increased from 240-360 mg daily  on admission to help control atrial dysrhythmias.  On admission, she was  found to be mildly hypokalemic with a potassium of 3.4.  This was  replenished, and the patient's hydrochlorothiazide which was unopposed  with any potassium supplementation was changed to Maxzide.  Once again,  her diltiazem was increased from 240-360 mg daily.  The patient is  complaining also of chronic bloating and gaseousness with abdominal pain  noted on the left lower quadrant.  She also complains of flushing fevers  and chills which seemed to be subjective, but blood cultures were sent  and the results as above showed no growth after 48 hours.  The patient  has been afebrile to measurement here this hospitalization.  Dr. Graciela Husbands  saw the patient the morning after her admission.  He called for  interrogation of the device, and this was  reprogrammed for a prolonged  PR delay.  He also began the patient on Maxzide.  Because the patient  has an indwelling prosthetic valve, he had low suspicion for her  complaint of fevers, chills with obtaining blood work.  He also obtained  a magnesium which was 2.0.  During the overnight, April 16, the patient  did have a paroxysmal bout of atrial tachycardia.  The pacemaker  followed the ectopic rhythm.  The burst lasted about 1-2-3 seconds.  The  patient was sleeping at the time, but when she was awakened by nursing  staff, could feel palpitations.  With regard to the patient's  constipation, she says that eating prunes or drinking prune juice does  help facilitate bowel movements, although she claims she has two-to-  three a week.  Prior to discharge April 17, she was seen by Dr. Graciela Husbands.  He noted the recurrent atrial tachycardia with pacemaker tracking the  ectopic focus.  He reprogrammed the device from DDR to DDIR . He ordered  a Myoview scan as an outpatient and a GI evaluation.   DISCHARGE MEDICATIONS:  1. Maxzide 37.5/25 one tablet daily.  She is to stop taking      hydrochlorothiazide.  2. Diltiazem 360 mg daily.  A new dose.  3. Levothyroxine 50 mcg daily.  4. Protonix 40 mg daily.  5. Coumadin 7.5 mg daily except Wednesday 10 mg.  6. Ambien 10 mg 1/2 tablet in the early evening before retiring and      1/2 tablet when she awakens in the middle of the night.  7. Tylenol 325 mg 1-2 tablets as needed.  8. Excedrin migraine as needed.  9. ProAir one inhalation as needed.   FOLLOW-UP APPOINTMENTS:  1. To see Dr. Leone Payor at [facility] at [facility],      [place], 3 o'clock on Monday April 20.  2. She also follows up with [facility], [address] stress test Wednesday, April 22, 8 o'clock.  She is asked      to eat nothing after midnight Tuesday April 21.  She is to see Dr.      Graciela Husbands, Monday, May 4 at 8:45.  she is to bring her  insurance card  to Dr. Marvell Fuller office.   LABORATORY DATA:  This admission:  Complete blood count April 15, white  cells 7.6, hemoglobin 15.1, hematocrit 45.6 platelets 215.  Serum  electrolytes:  Sodium 139, potassium 3.4.  This was replenished,  chloride 102,  carbonate 26, BUN is 11, creatinine 0.73 and glucose 92, protime on  admission 32, INR 3.0, alkaline phosphatase 104, SGOT 26, SGPT 16, total  cholesterol 175, triglycerides 123, HDL cholesterol 33, LDL cholesterol  117.  Thyroid-stimulating hormone mildly elevated 6.889.  T3 32.5 within  normal limits.  T4 1.82 mildly elevated.      Maple Mirza, PA     GM/MEDQ  D:  02/03/2008  T:  02/03/2008  Job:  [id number]   cc:   Duke Salvia, MD, [facility]  Iva Boop, MD,FACG  Dr. Maris Berger, [place]

## 2011-03-03 NOTE — Cardiovascular Report (Signed)
NAME:  WALLIS, SPIZZIRRI            ACCOUNT NO.:  [id number]   MEDICAL RECORD NO.:  [id number]         PATIENT TYPE:  COIB   LOCATION:                               FACILITY:  [facility]   PHYSICIAN:  Bevelyn Buckles. Bensimhon, MDDATE OF BIRTH:  [birth year]   DATE OF PROCEDURE:  DATE OF DISCHARGE:                            CARDIAC CATHETERIZATION   PATIENT IDENTIFICATION:  Ms. Nilson is a very pleasant 65 year old  woman with a history of rheumatic heart disease status post mitral valve  replacement.  She has had persistent atrial arrhythmias.  She was seen  by Dr. Graciela Husbands and set up for a TE-guided cardioversion for her atrial  flutter.  TE was requested due to subtherapeutic INR several weeks ago.   DESCRIPTION OF THE PROCEDURE:  The risks and indication of the procedure  explained.  Consent was signed and placed on the chart.  TE showed  normal LV function with normal function of the mitral valve prosthesis.  There was severe tricuspid regurgitation.  There is no left atrial  appendage clot.   After further sedation was achieved by anesthesia, direct current  cardioversion was performed using a single biphasic 150-joule  synchronized shock with prompt conversion to an atrial paced rhythm.  There is no apparent complications.      Bevelyn Buckles. Bensimhon, MD  Electronically Signed     DRB/MEDQ  D:  09/03/2008  T:  09/04/2008  Job:  [id number]

## 2011-03-03 NOTE — Assessment & Plan Note (Signed)
[facility]                                 ON-CALL NOTE   NAME:[name] [name], [name] [name]                     MRN:          [id number]  DATE:02/01/2008                            DOB:          [birth year]    PRIMARY CARDIOLOGIST:  Dr. Graciela Husbands.   SUMMARY AND HISTORY:  Ms. Kaman calls on February 01, 2008 at  approximately 6 p.m. stating that since her pacemaker was interrogated  and reprogrammed yesterday she has continued to have intermittent tachy  palpitations today and especially this afternoon she feels that they are  worse and they are associated with lightheadedness.  She states that  sometimes her heart rate has been up to 140.  She just started checking  her blood pressure this afternoon and she states her blood pressure was  over 200/100, and she states that that is unusual for her.  She denies  any chest discomfort, shortness of breath or neurological symptoms.   I gave Ms. Santino 2 options; 1 to present to the [facility] Emergency  Room for further evaluation of her possible hypertension and her tachy  palpitations; or that she could try taking a Tiazac 240 mg that she has  at home and to rest and reevaluate her heart rate and blood pressure in  approximately 1-2 hours, given the understanding if they remain  elevated, that she would need to come to the emergency room for further  evaluation.  Ms. Skaff elects to proceed to [facility] Emergency Room  at this time.  I have instructed her the safest possible mode of  transportation would be to call 911 for transportation.  I have informed  her that I will notify the emergency room of her pending arrival so that  they may call us on her arrival.      Joellyn Rued, PA-C  Electronically Signed      Bevelyn Buckles. Bensimhon, MD  Electronically Signed   EW/MedQ  DD: 02/01/2008  DT: 02/01/2008  Job #: [id number]

## 2011-03-03 NOTE — Discharge Summary (Signed)
NAME:  [name] [name], CAVENDISH            ACCOUNT NO.:  [id number]   MEDICAL RECORD NO.:  [id number]          PATIENT TYPE:  INP   LOCATION:  3707                         FACILITY:  [facility]   PHYSICIAN:  Theodore Demark, PA-C   DATE OF BIRTH:  July 01, 1946   DATE OF ADMISSION:  12/13/2007  DATE OF DISCHARGE:  12/16/2007                               DISCHARGE SUMMARY   PROCEDURES:  1. 2-D echocardiogram.  2. Insertion of a Medtronic Adapta dual lead permanent pacemaker.   PRIMARY FINAL DISCHARGE DIAGNOSIS:  Tachy-brady syndrome.   SECONDARY DIAGNOSES:  1. Paroxysmal atrial fibrillation.  2. Status post St. Jude mitral valve replacement secondary to      rheumatic heart disease.  3. History of atrial flutter status post ablation at [place].  4. Hypertension.  5. Hypothyroidism.  6. Chronic anticoagulation with Coumadin.  7. Allergy or intolerance to atenolol.  8. Preserved left ventricular function with an EF of 55-65%, no wall      motion abnormalities and peak arterial pressure 52 by      echocardiogram this admission with moderate to severe tricuspid      regurgitation in mitral valve area by pressure half-time 4.49 cm2,      mean transmittal gradient 3 mmHg.   TIME OF DISCHARGE:  Thirty-nine minutes.   HOSPITAL COURSE:  [name] [name] is a 65 year old female who had a mitral  valve replacement in 2002 secondary to rheumatic heart disease.  At  approximately the same time, she had a heart catheterization which  showed no obstructive disease and atrial flutter with ablation.  She has  done well since then.  However, over the last 2 months.  She would have  episodic tachy palpitations.  She was symptomatic with these.  There was  concern for paroxysmal atrial fibrillation with tachy-brady syndrome, so  she was admitted for further evaluation.   Her INR was 1.5 on admission and she was started on heparin until her  INR was therapeutic at two.  She was in sinus rhythm on admission with  a  heart rate in the 50s and a QTC of almost 500.  She was continued on her  home dose of Cardizem.  However, overnight she was going into a  junctional rhythm with prolonged pauses greater than 3 seconds,  ventricular escape beats and she was symptomatic with these with  dizziness and weakness.  An EP consult was called.   Dr. Graciela Husbands saw Ms. Vear Clock and contacted her physicians at [facility].  It was agreed that a permanent pacemaker was the best option and  on December 15, 2007, a Medtronic Adapta dual lead permanent pacemaker  in DDD mode was implanted.  She tolerated the procedure well.   The next day, her INR was therapeutic at 2.6.  Her chest x-ray is  pending, but if this is without significant abnormality, she is  tentatively considered stable for discharge with close outpatient follow-  up.   DISCHARGE INSTRUCTIONS:  Her activity level is to be increased  gradually.  She is to increase her arm motion per activity sheet  instructions.  She  is to call our office for any problems with her  incision.  She has a follow-up appoint with Dr. Graciela Husbands on March 2 at 9:45  and is to get a wound check and pacer check in the pacer clinic on March  16 at 9:40.  She is to see Dr. Graciela Husbands again on June 2, 10:30.  She is to  see Dr. Laymond Purser as needed.  She is to follow up with her physicians at  [facility] as scheduled.   DISCHARGE MEDICATIONS:  1. Tiazac 240 mg daily.  2. HCTZ 25 mg a day.  3. Levothyroxine 50 mcg daily  4. Ambien 10 mg q.h.s.  5. Protonix 40 mg a day.  6. Clarinex 5 mg daily p.r.n.  7. Darvocet p.r.n.  8. Coumadin 5 mg 1/2 tablet daily or as directed.      Theodore Demark, PA-C     RB/MEDQ  D:  12/16/2007  T:  12/17/2007  Job:  [id number]   cc:   Nicholes Rough, [place] Dr. Maris Berger  [facility], [facility] Dr. Freida Busman Henderlighter

## 2011-03-03 NOTE — Discharge Summary (Signed)
NAME:  [name] [name], [name] [name]            ACCOUNT NO.:  [id number]   MEDICAL RECORD NO.:  [id number]          PATIENT TYPE:  INP   LOCATION:  3707                         FACILITY:  [facility]   PHYSICIAN:  Thomas C. Wall, MD, FACCDATE OF BIRTH:  May 10, 1946   DATE OF ADMISSION:  12/13/2007  DATE OF DISCHARGE:  12/16/2007                               DISCHARGE SUMMARY   ALLERGIES:  This patient has allergy to ATENOLOL, reaction seems like  anaphylaxis.   TIME TO PREPARE THIS AND DISCUSSION WITH THE FAMILY:  Greater than 40  minutes.   FINAL DIAGNOSES:  1. Discharging day #1, status post implant of a Medtronic ADAPTA L      dual-chamber permanent pacemaker.  2. The patient had pace-terminated supraventricular tachycardia during      the procedure.  3. On telemetry this admission, prolonged pauses with absent atrial      impulse (asystole), some greater than 3 seconds, some up to 5      seconds.   PLAN:  1. If the SVT returns, the patient will need followup with Dr. Graciela Husbands      for SVT ablation.  2. Pacemaker implanted for tachybrady syndrome.   SECONDARY DIAGNOSES:  1. History of mitral regurgitation, status post mechanical mitral      valve replacement.  2. Hypertension.  3. Gastroesophageal reflux disease.  4. History of atrial fibrillation/atrial flutter, status post ablation      of atrial flutter, 2002.  5. Hypothyroidism.  6. Previous amiodarone therapy discontinued after ablation procedure.  7. History of rheumatic heart disease.   PROCEDURE:  December 15, 2007, implant of a dual-chamber Medtronic  ADAPTA pacemaker.   HISTORY AND PHYSICAL:  Ms. Bricco is a 65 year old female.  She has a  history of rheumatic heart disease.  She had her mitral valve replaced.  She has a history of atrial flutter; this was ablated.  She also has a  history of atrial fibrillation.  At the time of the ablation, the  patient was taken off amiodarone.  The patient also has a history of  hypertension and hypothyroidism.   The patient was admitted with complaints of palpitations and dizziness.  Over the past 2 months, she has had increasing episodes of very fast  rate, probably as many as 3-4 times a week.  She does occasionally get  chest pain and lightheaded with a fast rate.  She gets especially dizzy  when her heart rate is greater than 140-150.  The patient has had  numerous left heart catheterizations with no obstructive coronary  disease.   During her time here at [facility] in the emergency room, the patient  has had normal sinus rhythm in the 50s and 60s and no complaints.   HOSPITAL COURSE:  The patient was admitted to [facility],  February 24, with palpitations, dizziness and chest pain with fast heart  rates.  She was taking Cardizem 240 mg at home.  She was admitted to  [facility] and started on IV Cardizem at 2 mg per hour.  He was  also started on IV heparin for a  subtherapeutic INR.  Her cardiac  enzymes were also cycled and they were negative x3.  She had a 2-D  echocardiogram on February 25 which showed ejection fraction of 55% to  65%, no left ventricular regional wall motion abnormalities, no evidence  of aortic stenosis.  The aortic root was normal size.  There was a  bileaflet mechanical mitral valve prosthesis with trivial mitral  regurgitation, right ventricular systolic function mildly reduced, no  pericardial effusion.  She was found on telemetry, February 25, to be  bradycardic with pauses greater than 3 seconds, some up to 5-7 seconds;  Cardizem was stopped, Electrophysiology consult obtained and the plans  for implant of dual-chamber pacemaker made for February 26, when this  was implanted.  She has had no postprocedural complications.  After the  implantation, her Cardizem was restarted at 120 mg after 12 hours to go  up to 240 mg.  Of note, during the procedure, the patient had SVT which  was A-paced terminated.  The plan  is that if she has recurrent  tachycardia even on Cardizem, that she would present for SVT ablation  with [facility] [place].   DISCHARGE MEDICATIONS:  The patient was on the following medication:  1. Tiazac 240 mg daily.  2. Hydrochlorothiazide 25 mg daily.  3. Levothyroxine 50 mcg daily.  4. Ambien 10 mg at bedtime.  5. Protonix 40 mg daily.  6. Clarinex 5 mg as needed.  7. Darvocet as needed.  8. Coumadin 5 mg one and one-half tablet daily.   FOLLOWUP:  1. She has close followup.  She will present to Dr. Laymond Purser' office      on Monday, March 2, to check her pro time.  2. She will see Dr. Graciela Husbands on that time as well, March 2 at 9:45, to      check her incision, [facility], [facility] [address], Monday, March 16, at 9:40 to recheck her incision.  3. See Dr. Graciela Husbands to reinterrogate the device, Tuesday, June 2, at      10:30.   BLOOD WORK THIS ADMISSION:  On the day of discharge, hemoglobin 13.9,  hematocrit 41.1, platelets are 129,000 and white cell count is 4.7.  Serum electrolytes this admission:  Sodium 137, potassium 3.4, chloride  103, bicarbonate 26, BUN 6, creatinine 0.73, glucose is 79.  Her pro  time on the day of discharge is 28.4, INR 2.6.  Alkaline phosphatase  this admission 67, SGOT 24, SGPT is 19.  I study 0.02 then 0.02 then  0.02.  TSH this admission 3.852.      Maple Mirza, PA      Maisie Fus C. Daleen Squibb, MD, [facility]  Electronically Signed   GM/MEDQ  D:  12/16/2007  T:  12/17/2007  Job:  [id number]   cc:   Duke Salvia, MD, [facility], [place] Sistasis, Rowena MD  [place], [place] Hinderliter, Hessie Diener MD

## 2011-03-03 NOTE — Assessment & Plan Note (Signed)
[name] [name]                         ELECTROPHYSIOLOGY OFFICE NOTE   NAME:Kryder, Colin                     MRN:          [id number]  DATE:12/21/2007                            DOB:          1945-12-20    Mrs. [name] [name] is seen for pacemaker implantation for tachybrady  syndrome, in the context of prior mitral valve surgery, with mitral  valve replacement and a history of flutter ablation and PAF.  She also  had a pace-terminable supraventricular tachycardia, identified during  the EP study; which was not terminated by ventricular pacing,  interestingly.   In any case, she has a little bit of oozing on her incision.  I held  pressure on that for 3-4 minutes.  She remains on her Coumadin.   She would like to establish her care with [place], and I will arrange for her  to see Dr. Olga Millers to follow her for her mitral valve disease,  and I will continue to follow her for arrhythmic issues.     Duke Salvia, MD, [facility]  Electronically Signed    SCK/MedQ  DD: 12/21/2007  DT: 12/21/2007  Job #: [id number]

## 2011-03-03 NOTE — Progress Notes (Signed)
[facility]                  [facility] OFFICE NOTE   NAME:[name] [name]                     MRN:          [id number]  DATE:01/31/2008                            DOB:          [birth year]    [name] [name] was seen following pacemaker implantation for tachybrady  syndrome, in the context of mitral valve replacement and known atrial  tachycardia, history of atrial flutter, prior ablation and atrial  fibrillation.   She is doing pretty well.  She notes some increase in heart rate  activity with exertion.   CURRENT MEDICATIONS:  Her current medications include Coumadin,  Synthroid, Cardizem, Prilosec, hydrochlorothiazide.   EXAMINATION:  VITAL SIGNS:  Her blood pressure is 156/80, her pulse is  72.  Heart sounds are regular.  EXTREMITIES:  Without edema.   Interrogation of her pacemaker demonstrated normal pacemaker function  with her Medtronic device.  She is 56% atrial paced.  She has multiple  episodes of likely atrial tachycardia, states that probably 30% to 40%  of her heartbeats are faster than the 140 bend.  Her device is re-  programmed today to DDDR with mode switch at 140.  We will need to track  and see how much of this that she is having, what it is that we collect  in terms of electrograms and then make a decision as to whether we  pursue further drug therapy and/or catheter ablation.   We will plan to see her again in 2 months' time.     Duke Salvia, MD, [facility]  Electronically Signed    SCK/MedQ  DD: 01/31/2008  DT: 01/31/2008  Job #: [id number]

## 2011-03-03 NOTE — Op Note (Signed)
[name] [name], [name] [name]            ACCOUNT NO.:  [id number]   MEDICAL RECORD NO.:  [id number]          PATIENT TYPE:  INP   LOCATION:  3707                         FACILITY:  [facility]   PHYSICIAN:  Duke Salvia, MD, FACCDATE OF BIRTH:  06-23-1946   DATE OF PROCEDURE:  12/15/2007  DATE OF DISCHARGE:                               OPERATIVE REPORT   PREOPERATIVE DIAGNOSIS:  Tachybrady syndrome with recurrent presyncope.   POSTOPERATIVE DIAGNOSIS:  1. Tachybrady syndrome with recurrent presyncope.  2. Identified one-to-one long RP tachycardia terminated with atrial      pacing, and not terminated with ventricular pacing.   PROCEDURE:  Dual-chamber pacemaker implantation with rapid ventricular  pacing.   DESCRIPTION OF PROCEDURE:  Following obtaining informed consent, the  patient was brought to the electrophysiology laboratory and placed on  the fluoroscopic table in the supine position.  After routine prep and  draping of the left upper chest, lidocaine was infiltrated in the  prepectoral subclavicular region.  Incision was made and carried down to  a layer of the prepectoral fascia using electrocautery, and sharp  dissection.  A pocket was formed, similarly, hemostasis was obtained.   Thereafter attention was turned to gain access to the extrathoracic left  subclavian vein which was accomplished with mild difficulty, and because  of that; I ended up to double wiring.  Sequentially a 7-French sheath  was placed which was then passed.  A Medtronic [id number], a 52-cm, active  fixation, ventricular lead and a 5076, a 45-cm fixation atrial lead,  serial number [id number] in the V and [id number] in the a.  The  ventricular lead was marked with a tie.  Under fluoroscopic guidance it  was manipulated to the right ventricular septum where the bipolar R wave  was 12.1 with a pacing impedance of 1309 ohms, and a threshold of 0.9 V  at 0.5 msec.  Current of threshold was 1.7 MA.  There was  diaphragmatic  pacing at 10 volts in the current of injury was brisk pacing threshold  ultimately decreased.   The bipolar P-wave was 2 mV with the pacing impedance of 798 ohms, and a  threshold of 1 V at 0.4 msec.  The current of injury was, again, brisk;  and there was no diaphragmatic pacing with 10 volts.  Once the leads  were place.  We tried to terminate the aforementioned long RP  tachycardia.  Ventricular pacing had no impact on the tachycardia.  Atrial pacing resulted in termination of the tachycardia with a very  prolonged sinus pause of greater than 5 seconds.  We then began atrial  pacing and gradually the sinus rate resumed.   The leads were then attached to a Medtronic Adapta pulse generator  serial number [id number].  Ventricular pacing then pacing with  intrinsic conduction were identified.  The pocket was copiously  irrigated with antibiotic containing saline solution.  Because the  patient's INR was on the way up from 1.7 to 2.3 today, and not knowing  where it would be tomorrow, I ended up using Surgicel prophylactically  on the anterior posterior and cephalad aspects of  the pocket.   The leads of the pulse generator were then placed in the pocket and  secured with prepectoral fascia, and the wound was closed in two layers  in a normal fashion.  The wound was washed and dried, and a benzoin and  Steri-Strip dressing was applied.  Needle counts, sponge counts, and  instrument counts were correct at the end of procedure.   The patient tolerated the procedure without apparent complication.      Duke Salvia, MD, [facility]  Electronically Signed     SCK/MEDQ  D:  12/15/2007  T:  12/16/2007  Job:  [id number]   cc:   [address] [place]   [place]  [id number] Andrey Campanile, MD

## 2011-03-03 NOTE — Letter (Signed)
July 09, 2008    Shelle Iron, MD  [address] [place], [place] [address]   RE:  OREOLUWA, GILMER  MRN:  [id number]  /  DOB:  Nov 30, 1945   Dear Dr. Laymond Purser,   Mrs. Driver comes in today bringing with her a plethora of data from  testing that has been done over the last 6 months.  She has been having  problems with shortness of breath, some cough, and some pleuritic chest  pain.  She saw Dr. Emogene Morgan last week who thought that she was in  atrial fibrillation.  She had with her a report from studies done in  August describing a triangular-shaped consolidation in the right lower  lobe and a 4.1 ascending aortic aneurysm.  She does not recall therapy  directed at the former diagnostics directed at the latter.  She does  complain of the shortness of breath as noted and I should note that she  had an antecedent sinus infection.   Her current medications include Atacand 8 just started by Dr.  Emogene Morgan, hydrochlorothiazide 25, Cartia 300, Coumadin, Protonix, and  Levoxyl.   PHYSICAL EXAMINATION:  VITAL SIGNS:  Her blood pressure is 142/83 with a  pulse of 87, although at home her blood pressure has been in 90s/60s  with some lightheadedness.  LUNGS:  Clear to my examination.  There may be some few crackles on  pacer of the right.  HEART:  Sounds were regular.  NECK:  Veins were flat.  EXTREMITIES:  Without edema, although apparently they have been swollen.   Interrogation of her Medtronic adaptive pulse generator demonstrates an  R-wave of 5.6 with impedance of 460 and threshold at 0.75 of 0.4.  She  was then out of rhythm for the last 3 weeks or so and attempts were made  to pace terminate her atrial flutter and get her back into sinus rhythm,  which was unsuccessful.   IMPRESSION:  1. Atrial arrhythmias - recurrent now with flutter notably previous      flutter ablation and also atrial tachycardia as well as      fibrillation.  2. Rheumatic heart disease with  moderate to severe tricuspid      regurgitation, bileaflet mechanical mitral valve prosthesis.  3. Right lower lobe consolidation, question cause, without fever.  4. Some dizziness on Atacand.   Dr. Laymond Purser,  Mrs. Doucet probably needs to be cardioverted as this  may be contributing to some of her shortness of breath.  I will plan to  do that but I would like first to have some clarity on what the process  is in her right lower lung that seems to be associated with some  pleuritic pain, but no fever.  I have asked her to follow up with you  about that.  I will afford to hearing from her once when she has a plan  for that.   Thanks very much for allowing me to participate in her care.    Sincerely,      Duke Salvia, MD, [facility]  Electronically Signed    SCK/MedQ  DD: 07/09/2008  DT: 07/10/2008  Job #: [id number]   CC:    Andrey Campanile, MD

## 2011-03-03 NOTE — Assessment & Plan Note (Signed)
[facility]                         GASTROENTEROLOGY OFFICE NOTE   NAME:[name], [name]                     MRN:          [id number]  DATE:02/06/2008                            DOB:          05-22-1946    REQUESTING PHYSICIAN:  Duke Salvia, MD, [facility].   REASON FOR CONSULTATION:  Chronic abdominal pain, bloating,  constipation.   ASSESSMENT:  This 65 year old white woman with chronic problems of  bloating, diffuse abdominal pain, and constipation difficulties.  This  sounds most like constipation-predominant irritable bowel syndrome, but  it has been a long time since she has had a colonoscopy, so colorectal  neoplasia ought to be excluded as well as diverticular disease.  The  situation is complicated by mitral valve replacement, need for Coumadin,  and inability to come off anticoagulation except for a very short period  of time.  This causes an increased risk of a possible embolic event  related to holding Coumadin.   RECOMMENDATIONS/PLANS:  1. MiraLax on a daily basis, start out twice a day and then see if she      can go to once a day.  2. Schedule colonoscopy for reasons as outlined above.  Risks,      benefits and indications described to the patient, and she      understands and agrees to proceed, including the difficulties with      requiring a Lovenox window, but I think that is the most sensible      approach, and we will request Dr. Odessa Fleming opinion about this and      coordinate that through the Coumadin clinic.  3. Further plans pending that.  I suspect she will have an improvement      in her bloating and constipation symptoms once the MiraLax works,      and I would anticipate an improvement in upper abdominal      discomfort, as she seems to get relief of that when she defecates.      I think the primary problem is the constipation.  Should that not      prove to be the case, an upper GI endoscopy could be needed.   HISTORY:   A 64 year old white woman with problems as outlined above for  years and years.  She moves her bowels about twice a week but only when  she drinks hot coffee, eats hot prunes, and takes a suppository at  times.  She gets relief of the symptoms described above when she has  that period.  She feels full and bloated after meals but definitely  feels better when she has had a bowel movement.  She has had some sort  of pressure in the substernal area, persistent for a few months.  Cardiac workup has been negative.  She does not describe any rectal  bleeding, melena, or hematochezia.  She has had a colonoscopy as well as  an upper GI endoscopy in [place] over 25 years ago and then a  subsequent upper endoscopy in [place] within the past few years.  She had previously been followed for many things in [place] but has  been moving her care to [place] due to the distance.   PAST MEDICAL HISTORY:  1. Recurrent atrial tachycardia, treated with Medtronic dual-chamber      pacemaker in February, 2009.  2. Chronic recurrent abdominal pain, constipation, gaseousness, and a      diagnosis of irritable bowel syndrome.  3. 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 Jude      mitral valve, 2002, on chronic Coumadin.  4. Atrial flutter ablation in 2002.  5. Hypertension.  6. Hypothyroidism.  7. Chronic insomnia.  8. Moderate-to-severe tricuspid regurgitation.  9. Past history of ulcers versus gastritis 25 years ago.  10.Anxiety.  11.Additional surgical history includes cholecystectomy in 1995, an      oophorectomy in 1984, and tonsillectomy at age 10.   ALLERGIES:  She is allergic to ATENOLOL.   MEDICATIONS:  1. Levoxyl 50 mcg daily.  2. Cartia XT 360 mg daily.  3. Triamterene/HCTZ 37.5/12.5 daily.  4. Protonix 40 mg daily.  5. Coumadin as directed.  6. Rectal suppositories p.r.n.  7. Ambien p.r.n. at bedtime.   FAMILY HISTORY:  Sister and brother have had colon  polyps.  A brother  has had Crohn's disease.  There is diabetes, heart disease, irritable  bowel syndrome.   SOCIAL HISTORY:  She is disabled.  She lists that she is single, two  daughters.  Drinks two caffeinated beverages a day.  No drug use.  No  smokeless tobacco.   REVIEW OF SYSTEMS:  All other systems are negative.   PHYSICAL EXAMINATION:  A well-developed, petite white woman.  She is in  no acute distress.  Height 5 feet.  Weight 144 pounds.  Blood pressure 144/70, pulse 64 and  regular.  HEENT:  Eyes are anicteric.  ENT shows denture (she recently had her  teeth pulled and dentures placed, apparently not placed on a Lovenox  window).  Otherwise, no oral lesions.  NECK:  Supple.  No thyromegaly or mass.  CHEST:  Clear, resonant.  HEART:  S1 and S2.  There is a metallic S1 heard.  I do not hear a  murmur.  There is no jugular venous distention.  She has a regular rate  and rhythm.  There is a pacemaker in the left upper chest.  ABDOMEN:  Soft and nontender, no organomegaly or mass.  Bowel sounds are  present.  RECTAL:  Deferred until colonoscopy.  LYMPHATIC:  No supraclavicular or adenopathy detected.  EXTREMITIES:  No peripheral edema, clubbing or cyanosis.  SKIN:  Tanned, warm, and dry.  No acute rash.  NEURO:  Cranial nerves II-XII intact.  Grossly nonfocal.  PSYCH:  Appropriate mood and affect.   I reviewed the recent hospital discharge summary.  She had a normal CBC  on her April 15-17 admission.  She had a mildly elevated TSH but normal  T3.  TSH was 6.889.  Her INR was 3.  LFTs normal.   I appreciate the opportunity to care for this patient.     Iva Boop, MD,FACG  Electronically Signed    CEG/MedQ  DD: 02/06/2008  DT: 02/06/2008  Job #: [id number]   cc:   Duke Salvia, MD, [facility]  Maris Berger, M.D.

## 2011-03-03 NOTE — Assessment & Plan Note (Signed)
[facility]                                 ON-CALL NOTE   NAME:Grayson, Lattie                     MRN:          [id number]  DATE:12/19/2007                            DOB:          Oct 20, 1945    PATIENT NAME:  [name] [name].  DOB:  10/29/1945   PRIMARY CARDIOLOGIST:  Maisie Fus C. Daleen Squibb, MD, [facility]   ELECTROPHYSIOLOGIST:  Duke Salvia, MD, [facility]   ATTENDING PHYSICIAN:  Bevelyn Buckles. Bensimhon, MD   I received a phone call from [facility] concerning her blood  pressure.  Her blood pressure was found to be elevated at 185/105 per  home BP device.  The patient was quite anxious.  A friend who was with  her made the phone call, Mr. Thresa Ross.  The patient was concerned  that she needed to take an additional dose of her medication.  The  patient also took a Xanax and she states that she often has panic  attacks and Xanax tends to help, but she was concerned about her blood  pressure.  The patient does take HCTZ 25 mg in the morning along with  Tiazac.  I have advised the patient to take additional 25 mg if HCTZ  tonight,  to rest and allow her Xanax to help her as well, and to call  me back if her blood pressure remained elevated.   I called the patient back approximately one hour later to check her  blood pressure.  Mr. Donata Duff was with her and he took her blood pressure  again for me.  Her blood pressure had come down to 156/78.  The patient  was feeling much better.  I advised the patient to rest overnight, to  recheck her blood pressure in the morning, and if it remained elevated,  to please call our office.      Bettey Mare. Lyman Bishop, NP  Electronically Signed      Bevelyn Buckles. Bensimhon, MD  Electronically Signed   KML/MedQ  DD: 12/19/2007  DT: 12/19/2007  Job #: [id number]

## 2011-03-03 NOTE — Assessment & Plan Note (Signed)
[facility] OFFICE NOTE   NAME:[name] [name], [name] [name]                     MRN:          [id number]  DATE:02/10/2008                            DOB:          02-08-1946    [name] [name] is a pleasant 65 year old female who has a history of  mitral valve replacement in 2002 secondary to a history of rheumatic  fever, history of atrial fibrillation/ flutter status post ablation,  atrial arrhythmias, status post pacemaker placement, and hypothyroidism  who presents for evaluation of dyspnea.  She was recently admitted to  [facility] in February.  At that time, she was admitted with  SVT but was also having bradycardia.  She had an echocardiogram at that  time that showed an ejection fraction of 55-65%, a bileaflet mechanical  mitral valve prosthesis with trivial mitral regurgitation, and mildly  reduced RV function.  She had a pacemaker placed at that time.  She was  readmitted to [facility] on February 01, 2008 due to recurrent  tachy palpitations.  At that time, her Cardizem was increased from 240  to 360.  She was also scheduled to have a Myoview which performed on  February 08, 2008.  Her LV function was normal with an estimated ejection  fraction of 75%.  There was no ischemia or scar.  Since then, she  continues to have dyspnea.  She describes it feeling as she can't take  a deep breath.  This does not change with the supine position nor does  it change with exertion.  It is present continuously.  She wonders  whether it  may be a bloated feeling.  She has not had chest pain or  syncope.  She does state that she occasionally has mild pedal edema, but  this improves overnight.   MEDICATIONS:  1. Maxzide 25 mg daily.  2. Levoxyl 50 mcg daily.  3. Ambien 12.5 mg daily.  4. Coumadin.  5. Protonix.  6. Provera p.r.n.  7. Albuterol p.r.n.  8. Cardizem 360 mg p.o. daily.   PHYSICAL EXAMINATION:  VITAL  SIGNS:  Blood pressure of 151/80, pulse 80.  She weighs 143 pounds.  HEENT:  Normal.  NECK:  Supple.  CHEST:  Clear.  CARDIOVASCULAR:  Regular rate and rhythm.  There is a crisp mechanical  valve sound and a 2/6 systolic murmur.  ABDOMEN:  No tenderness.  EXTREMITIES:  No edema.   DIAGNOSES:  1. Dyspnea.  The etiology of this is unclear.  Note, her recent      echocardiogram showed normal functioning mitral valve and normal      left ventricular function by report.  Her Myoview is normal.  I do      not find her significantly volume overloaded on examination.  We      will not pursue this further at this point.  I have asked her to      follow up with her primary care physician concerning this issue.  2. Status post mitral valve replacement.  She will continue on      Coumadin.  We will arrange for to be followed in our Coumadin      clinic at her request.  3. Hypothyroidism.  This is being managed per her primary care      physician.  4. Hypertension.  Blood pressure appears to be reasonable at present,      and this will need to be tracked and her medications can be      adjusted as indicated.  5. History of tachy palpitations/atrial arrhythmias.  She will      continue on the Cardizem and she will follow up with Dr. Graciela Husbands for      further management of this issue.  This has been scheduled for Feb 20, 2008 by her report.  6. History of anxiety.     Madolyn Frieze Jens Som, MD, [facility]  Electronically Signed    BSC/MedQ  DD: 02/10/2008  DT: 02/10/2008  Job #: [id number]

## 2011-03-03 NOTE — H&P (Signed)
NAME:  [name] [name], [name] [name]            ACCOUNT NO.:  [id number]   MEDICAL RECORD NO.:  [id number]          PATIENT TYPE:  EMS   LOCATION:  [facility]                         FACILITY:  [facility]   PHYSICIAN:  Vernice Jefferson, MD          DATE OF BIRTH:  Jun 07, 1946   DATE OF ADMISSION:  12/13/2007  DATE OF DISCHARGE:                              HISTORY & PHYSICAL   CHIEF COMPLAINT:  Palpitations and dizziness.   HISTORY OF PRESENT ILLNESS:  The patient is a 65 year old white female  with history of mitral valve replacement secondary to rheumatic heart  disease, status post atrial flutter ablation with paroxysmal atrial  fibrillation according to her outside physician notes at [facility],  hypertension, hypothyroidism, who comes in with complaints of  palpitations and dizziness.  She reports that over the past 2 months,  she has had increasing episodes of a very fast heart rate.  She reports  probably as many as 3-4 times a week.  With these fast heart rates, the  patient noticed that she does occasionally get chest pain and  occasionally gets lightheaded.  Especially gets dizzy when her heart  rate goes from 140-150.  The patient has had a previous left heart  catheterization numerous times with no obstructive coronary disease.  The last one was in 2002.  Since she has been in the ED, the patient has  had a normal sinus rhythm in the 50-60s and has had no complaints.   PAST MEDICAL HISTORY:  1. Status post mitral valve replacement (St. Jude) in 2002 with a      history of left ventricular systolic function.  2. Paroxysmal atrial fibrillation.  3. History of atrial flutter status post flutter ablation.  4. Hypertension.  5. Hypothyroidism.   SOCIAL HISTORY:  She has 2 daughters.  She lives in [place].  Nonsmoker.  Nondrinker.  Not an IV drug user.  Former Neurosurgeon, now on  disability.   FAMILY HISTORY:  Really noncontributory to the patient's current medical  condition.   CURRENT MEDICATIONS:  1.  Coumadin.  2. Protonix.  3. Hydrochlorothiazide.  4. Levothyroxine.  5. Ambien.  6. Darvocet.   ALLERGIES:  ATENOLOL.  The patient did not have an allergic reaction,  but did have a dystonic reaction reportedly.   REVIEW OF SYSTEMS:  Negative 11 point review of systems except for the  ones dictated above in the HPI.   PHYSICAL EXAMINATION:  VITAL SIGNS:  Blood pressure 173/47, heart rate  55, temperature 98.2, respirations 20.  GENERAL:  Well-developed, well-nourished white female in no acute  distress.  HEENT:  Moist mucous membranes.  Sclerae are clear.  No conjunctival  pallor.  NECK:  Supple with full range of motion.  No jugular venous distention.  CARDIOVASCULAR:  Regular rate and rhythm with a good mechanical click.  No murmurs.  LUNGS:  Clear to auscultation bilaterally.  No wheezes, rales or  rhonchi.  ABDOMEN:  Soft, nontender and nondistended.  Normoactive bowel sounds.  EXTREMITIES:  No peripheral edema.  Pulses are 2+ bilaterally.  NEURO:  Alert and oriented x 3.  Cranial nerves II-XII are grossly  intact.   DIAGNOSTICS:  1. Chest x-ray demonstrates cardiomegaly without any edema.  No acute      infiltrates.  2. EKG here demonstrates a sinus bradycardia with occasional PACs and      nonspecific ST/T wave abnormality.  QT interval is prolonged to      almost 500.   LABORATORY DATA:  Hemoglobin 16.3, BUN 9, creatinine 1.0, PT/INR 1.5,  cardiac biomarkers are negative.   IMPRESSION:  1. Symptomatic paroxysmal atrial fibrillation.  2. Status post mitral valve replacement on Coumadin, but      subtherapeutic on her current dosing.  3. Hypothyroidism.   PLAN:  The patient needs to be further evaluated for tachy-brady  syndrome and may consider rhythm control with possible pacemaker  placement.  Given her symptoms, she states are more apparent when she  becomes bradycardic, I think this may be an option for her.  We will  hold Coumadin now and just initiate  heparin for her mitral valve and  cycle biomarkers.  Given her prior negative catheterization, I doubt  that her chest pain is an acute coronary syndrome, probably more likely  just due to the increased rate.  We will check a TSH in the morning as  well.      Vernice Jefferson, MD  Electronically Signed     JT/MEDQ  D:  12/13/2007  T:  12/14/2007  Job:  [id number]

## 2011-03-26 ENCOUNTER — Encounter: Payer: PRIVATE HEALTH INSURANCE | Admitting: *Deleted

## 2011-03-31 ENCOUNTER — Encounter: Payer: Self-pay | Admitting: *Deleted

## 2011-04-06 ENCOUNTER — Encounter: Payer: Self-pay | Admitting: Internal Medicine

## 2011-05-07 ENCOUNTER — Encounter: Payer: Self-pay | Admitting: Internal Medicine

## 2011-07-10 LAB — CBC
HCT: 41.1
HCT: 44.5
HCT: 44.6
Hemoglobin: 13.6
Hemoglobin: 13.9
Hemoglobin: 15
Hemoglobin: 15.1 — ABNORMAL HIGH
MCHC: 33.8
MCHC: 34
RBC: 4.52
RBC: 4.7
RBC: 5.05
RDW: 13.9
WBC: 5.3

## 2011-07-10 LAB — COMPREHENSIVE METABOLIC PANEL
ALT: 19
Albumin: 3.7
Alkaline Phosphatase: 67
BUN: 6
Chloride: 103
Glucose, Bld: 79
Potassium: 3.4 — ABNORMAL LOW
Sodium: 137
Total Bilirubin: 1
Total Protein: 6.3

## 2011-07-10 LAB — PROTIME-INR
INR: 1.8 — ABNORMAL HIGH
INR: 2.3 — ABNORMAL HIGH
INR: 2.6 — ABNORMAL HIGH
Prothrombin Time: 21.6 — ABNORMAL HIGH
Prothrombin Time: 26.3 — ABNORMAL HIGH

## 2011-07-10 LAB — LIPID PANEL
Cholesterol: 180
HDL: 33 — ABNORMAL LOW
LDL Cholesterol: 128 — ABNORMAL HIGH
Total CHOL/HDL Ratio: 5.5
Triglycerides: 94

## 2011-07-10 LAB — I-STAT 8, (EC8 V) (CONVERTED LAB)
Acid-Base Excess: 4 — ABNORMAL HIGH
Bicarbonate: 25.8 — ABNORMAL HIGH
HCT: 48 — ABNORMAL HIGH
Operator id: 294501
Sodium: 139
TCO2: 27
pCO2, Ven: 30.8 — ABNORMAL LOW

## 2011-07-10 LAB — POCT CARDIAC MARKERS
Myoglobin, poc: 39.8
Operator id: 294501
Troponin i, poc: <0.05

## 2011-07-10 LAB — DIFFERENTIAL
Eosinophils Relative: 0
Lymphocytes Relative: 27
Monocytes Absolute: 0.4
Monocytes Relative: 9
Neutro Abs: 3.3

## 2011-07-10 LAB — CK TOTAL AND CKMB (NOT AT ARMC): CK, MB: 1.2

## 2011-07-10 LAB — CARDIAC PANEL(CRET KIN+CKTOT+MB+TROPI)
Relative Index: INVALID
Total CK: 60
Troponin I: 0.02

## 2011-07-10 LAB — TROPONIN I: Troponin I: 0.02

## 2011-07-10 LAB — HEPARIN LEVEL (UNFRACTIONATED): Heparin Unfractionated: 0.67

## 2011-07-14 LAB — CBC
HCT: 45.6
Hemoglobin: 15.1 — ABNORMAL HIGH
MCV: 88.8
RDW: 14.6

## 2011-07-14 LAB — LIPID PANEL
HDL: 33 — ABNORMAL LOW
Triglycerides: 123
VLDL: 25

## 2011-07-14 LAB — COMPREHENSIVE METABOLIC PANEL
Alkaline Phosphatase: 104
BUN: 11
Chloride: 102
Glucose, Bld: 92
Potassium: 3.4 — ABNORMAL LOW
Total Bilirubin: 0.6
Total Protein: 7.6

## 2011-07-14 LAB — CULTURE, BLOOD (ROUTINE X 2)

## 2011-07-14 LAB — PROTIME-INR
Prothrombin Time: 32 — ABNORMAL HIGH
Prothrombin Time: 33.4 — ABNORMAL HIGH
Prothrombin Time: 40.7 — ABNORMAL HIGH

## 2011-07-14 LAB — T4, FREE: Free T4: 1.82 — ABNORMAL HIGH

## 2011-07-21 LAB — PROTIME-INR: INR: 2.4 — ABNORMAL HIGH

## 2011-07-21 LAB — BASIC METABOLIC PANEL
BUN: 11
Calcium: 9.6
GFR calc non Af Amer: >60
Glucose, Bld: 104 — ABNORMAL HIGH
Potassium: 3.9

## 2011-07-21 LAB — CBC
HCT: 43.2
Platelets: 165
RDW: 14.2

## 2011-11-06 ENCOUNTER — Encounter: Payer: Self-pay | Admitting: *Deleted

## 2011-11-20 DIAGNOSIS — I219 Acute myocardial infarction, unspecified: Secondary | ICD-10-CM

## 2011-11-20 HISTORY — DX: Acute myocardial infarction, unspecified: I21.9

## 2012-02-23 ENCOUNTER — Telehealth: Payer: Self-pay | Admitting: Internal Medicine

## 2012-02-23 NOTE — Telephone Encounter (Signed)
02-23-12 pt had rtn mail past due letter dated 11-06-11, called pt 02-12-12 906am, n/a, unable to leave message, called contact number and its not working, called again 02-23-12 @ 1013am still n/a and unable to leave message/mt

## 2012-04-15 ENCOUNTER — Telehealth: Payer: Self-pay | Admitting: Internal Medicine

## 2012-04-15 NOTE — Telephone Encounter (Signed)
04-15-12 lmm @413pm  for pt to set up past due pacer ck/mt

## 2012-06-14 LAB — PACEMAKER DEVICE OBSERVATION

## 2012-06-15 ENCOUNTER — Telehealth: Payer: Self-pay | Admitting: Internal Medicine

## 2012-06-15 NOTE — Telephone Encounter (Signed)
Pt has been having device checked in [place], yesterday she was told to have an ablation, her ov notes states ahr suspended and wants to talk to nurse

## 2012-06-15 NOTE — Telephone Encounter (Signed)
Paper work received. I have left a message for the patient to call.

## 2012-06-15 NOTE — Telephone Encounter (Signed)
F/u   Patient will be faxing over test results that she had done on yesterday . Asking for a call back to review.

## 2012-06-16 NOTE — Telephone Encounter (Signed)
F/u   Pt would like to speak with Nurse HM again to ask more questions. She can be reached after 3pm

## 2012-06-16 NOTE — Telephone Encounter (Signed)
I spoke with the patient. She states that she has been followed by a cardiologist at [facility]. She was seen by a Dr. Johnney Killian (?) on Tuesday this week. She was told that they had suspended her graphics in order to preserve the life her PPM battery. The patient also states that she was told she would need another ablation and is being referred to someone in [facility]. She states she does not want another ablation done. She also explained she sees a doctor in [place] for the same thing and has for many years. It is quite unclear to me what the patient is wanting [place] to help her with. I advised I would forward her interrogation paperwork to the device clinic so they may call and try to explain some things to her. I stated that may ease her mind some, but other than that, she is very vague on what she wants from Dr. Graciela Husbands. I will forward to Kristin/ Gunnar Fusi in the device clinic to document once they have spoken with her. She is agreeable with speaking with the device clinic.

## 2012-06-17 NOTE — Telephone Encounter (Signed)
Spoke w/pt and answered questions. Pt is calling [facility] to make appt with her dr in regards to atrial flutter.

## 2012-06-27 ENCOUNTER — Encounter: Payer: Self-pay | Admitting: Internal Medicine

## 2014-06-08 DIAGNOSIS — I519 Heart disease, unspecified: Secondary | ICD-10-CM

## 2014-06-08 DIAGNOSIS — I2510 Atherosclerotic heart disease of native coronary artery without angina pectoris: Secondary | ICD-10-CM

## 2014-06-08 HISTORY — DX: Heart disease, unspecified: I51.9

## 2014-06-08 HISTORY — DX: Atherosclerotic heart disease of native coronary artery without angina pectoris: I25.10

## 2015-07-27 DIAGNOSIS — Z7901 Long term (current) use of anticoagulants: Secondary | ICD-10-CM | POA: Diagnosis not present

## 2015-07-27 DIAGNOSIS — Z952 Presence of prosthetic heart valve: Secondary | ICD-10-CM | POA: Diagnosis not present

## 2015-08-22 DIAGNOSIS — H2512 Age-related nuclear cataract, left eye: Secondary | ICD-10-CM | POA: Diagnosis not present

## 2015-08-22 DIAGNOSIS — H2511 Age-related nuclear cataract, right eye: Secondary | ICD-10-CM | POA: Diagnosis not present

## 2015-08-22 DIAGNOSIS — H18412 Arcus senilis, left eye: Secondary | ICD-10-CM | POA: Diagnosis not present

## 2015-08-22 DIAGNOSIS — H18411 Arcus senilis, right eye: Secondary | ICD-10-CM | POA: Diagnosis not present

## 2015-08-27 DIAGNOSIS — R69 Illness, unspecified: Secondary | ICD-10-CM | POA: Diagnosis not present

## 2015-08-27 DIAGNOSIS — E039 Hypothyroidism, unspecified: Secondary | ICD-10-CM | POA: Diagnosis not present

## 2015-08-27 DIAGNOSIS — G4700 Insomnia, unspecified: Secondary | ICD-10-CM | POA: Diagnosis not present

## 2015-08-27 DIAGNOSIS — I10 Essential (primary) hypertension: Secondary | ICD-10-CM | POA: Diagnosis not present

## 2015-09-09 DIAGNOSIS — Z952 Presence of prosthetic heart valve: Secondary | ICD-10-CM | POA: Diagnosis not present

## 2015-09-09 DIAGNOSIS — Z7901 Long term (current) use of anticoagulants: Secondary | ICD-10-CM | POA: Diagnosis not present

## 2015-09-11 DIAGNOSIS — I2510 Atherosclerotic heart disease of native coronary artery without angina pectoris: Secondary | ICD-10-CM | POA: Diagnosis not present

## 2015-09-11 DIAGNOSIS — R011 Cardiac murmur, unspecified: Secondary | ICD-10-CM | POA: Diagnosis not present

## 2015-09-11 DIAGNOSIS — I10 Essential (primary) hypertension: Secondary | ICD-10-CM | POA: Diagnosis not present

## 2015-09-11 DIAGNOSIS — Z8679 Personal history of other diseases of the circulatory system: Secondary | ICD-10-CM | POA: Diagnosis not present

## 2015-09-11 DIAGNOSIS — I4891 Unspecified atrial fibrillation: Secondary | ICD-10-CM | POA: Diagnosis not present

## 2015-09-11 DIAGNOSIS — I712 Thoracic aortic aneurysm, without rupture: Secondary | ICD-10-CM | POA: Diagnosis not present

## 2015-09-11 DIAGNOSIS — I110 Hypertensive heart disease with heart failure: Secondary | ICD-10-CM | POA: Diagnosis not present

## 2015-09-11 DIAGNOSIS — E876 Hypokalemia: Secondary | ICD-10-CM | POA: Diagnosis not present

## 2015-09-11 DIAGNOSIS — I509 Heart failure, unspecified: Secondary | ICD-10-CM | POA: Diagnosis not present

## 2015-09-11 DIAGNOSIS — R0600 Dyspnea, unspecified: Secondary | ICD-10-CM | POA: Diagnosis not present

## 2015-09-11 DIAGNOSIS — I350 Nonrheumatic aortic (valve) stenosis: Secondary | ICD-10-CM | POA: Diagnosis not present

## 2015-09-11 DIAGNOSIS — I480 Paroxysmal atrial fibrillation: Secondary | ICD-10-CM | POA: Diagnosis not present

## 2015-09-11 DIAGNOSIS — R079 Chest pain, unspecified: Secondary | ICD-10-CM | POA: Diagnosis not present

## 2015-09-11 DIAGNOSIS — Z7901 Long term (current) use of anticoagulants: Secondary | ICD-10-CM | POA: Diagnosis not present

## 2015-09-11 DIAGNOSIS — Z79899 Other long term (current) drug therapy: Secondary | ICD-10-CM | POA: Diagnosis not present

## 2015-09-11 DIAGNOSIS — R0789 Other chest pain: Secondary | ICD-10-CM | POA: Diagnosis not present

## 2015-09-12 DIAGNOSIS — R072 Precordial pain: Secondary | ICD-10-CM | POA: Diagnosis not present

## 2015-09-12 DIAGNOSIS — I25110 Atherosclerotic heart disease of native coronary artery with unstable angina pectoris: Secondary | ICD-10-CM | POA: Diagnosis not present

## 2015-09-12 DIAGNOSIS — I480 Paroxysmal atrial fibrillation: Secondary | ICD-10-CM | POA: Diagnosis not present

## 2015-09-12 DIAGNOSIS — Z952 Presence of prosthetic heart valve: Secondary | ICD-10-CM | POA: Diagnosis not present

## 2015-09-12 DIAGNOSIS — Z955 Presence of coronary angioplasty implant and graft: Secondary | ICD-10-CM | POA: Diagnosis not present

## 2015-09-12 DIAGNOSIS — I10 Essential (primary) hypertension: Secondary | ICD-10-CM | POA: Diagnosis not present

## 2015-09-12 DIAGNOSIS — R0789 Other chest pain: Secondary | ICD-10-CM | POA: Diagnosis not present

## 2015-09-12 DIAGNOSIS — I2510 Atherosclerotic heart disease of native coronary artery without angina pectoris: Secondary | ICD-10-CM | POA: Diagnosis not present

## 2015-09-13 DIAGNOSIS — I082 Rheumatic disorders of both aortic and tricuspid valves: Secondary | ICD-10-CM | POA: Diagnosis not present

## 2015-09-13 DIAGNOSIS — R0789 Other chest pain: Secondary | ICD-10-CM | POA: Diagnosis not present

## 2015-09-13 DIAGNOSIS — I480 Paroxysmal atrial fibrillation: Secondary | ICD-10-CM | POA: Diagnosis not present

## 2015-09-13 DIAGNOSIS — Z950 Presence of cardiac pacemaker: Secondary | ICD-10-CM | POA: Diagnosis not present

## 2015-09-13 DIAGNOSIS — R079 Chest pain, unspecified: Secondary | ICD-10-CM | POA: Diagnosis not present

## 2015-09-13 DIAGNOSIS — I10 Essential (primary) hypertension: Secondary | ICD-10-CM | POA: Diagnosis not present

## 2015-09-13 DIAGNOSIS — E876 Hypokalemia: Secondary | ICD-10-CM | POA: Diagnosis not present

## 2015-09-13 DIAGNOSIS — Z955 Presence of coronary angioplasty implant and graft: Secondary | ICD-10-CM | POA: Diagnosis not present

## 2015-09-13 DIAGNOSIS — I25110 Atherosclerotic heart disease of native coronary artery with unstable angina pectoris: Secondary | ICD-10-CM | POA: Diagnosis not present

## 2015-09-13 DIAGNOSIS — I517 Cardiomegaly: Secondary | ICD-10-CM | POA: Diagnosis not present

## 2015-09-13 DIAGNOSIS — I501 Left ventricular failure: Secondary | ICD-10-CM | POA: Diagnosis not present

## 2015-09-13 DIAGNOSIS — I2510 Atherosclerotic heart disease of native coronary artery without angina pectoris: Secondary | ICD-10-CM | POA: Diagnosis not present

## 2015-09-13 HISTORY — PX: CORONARY ANGIOPLASTY WITH STENT PLACEMENT: SHX49

## 2015-09-14 DIAGNOSIS — I428 Other cardiomyopathies: Secondary | ICD-10-CM | POA: Diagnosis not present

## 2015-09-14 DIAGNOSIS — I10 Essential (primary) hypertension: Secondary | ICD-10-CM | POA: Diagnosis not present

## 2015-09-14 DIAGNOSIS — R072 Precordial pain: Secondary | ICD-10-CM | POA: Diagnosis not present

## 2015-09-14 DIAGNOSIS — I350 Nonrheumatic aortic (valve) stenosis: Secondary | ICD-10-CM | POA: Diagnosis not present

## 2015-09-14 DIAGNOSIS — I480 Paroxysmal atrial fibrillation: Secondary | ICD-10-CM | POA: Diagnosis not present

## 2015-09-14 DIAGNOSIS — I25118 Atherosclerotic heart disease of native coronary artery with other forms of angina pectoris: Secondary | ICD-10-CM | POA: Diagnosis not present

## 2015-09-14 DIAGNOSIS — I482 Chronic atrial fibrillation: Secondary | ICD-10-CM | POA: Diagnosis not present

## 2015-09-14 DIAGNOSIS — Z952 Presence of prosthetic heart valve: Secondary | ICD-10-CM | POA: Diagnosis not present

## 2015-09-14 DIAGNOSIS — Z955 Presence of coronary angioplasty implant and graft: Secondary | ICD-10-CM | POA: Diagnosis not present

## 2015-09-14 DIAGNOSIS — I25110 Atherosclerotic heart disease of native coronary artery with unstable angina pectoris: Secondary | ICD-10-CM | POA: Diagnosis not present

## 2015-09-20 DIAGNOSIS — I519 Heart disease, unspecified: Secondary | ICD-10-CM | POA: Diagnosis not present

## 2015-09-20 DIAGNOSIS — I099 Rheumatic heart disease, unspecified: Secondary | ICD-10-CM | POA: Diagnosis not present

## 2015-09-20 DIAGNOSIS — I10 Essential (primary) hypertension: Secondary | ICD-10-CM | POA: Diagnosis not present

## 2015-09-20 DIAGNOSIS — I480 Paroxysmal atrial fibrillation: Secondary | ICD-10-CM | POA: Diagnosis not present

## 2015-09-20 DIAGNOSIS — I2510 Atherosclerotic heart disease of native coronary artery without angina pectoris: Secondary | ICD-10-CM | POA: Diagnosis not present

## 2015-10-17 DIAGNOSIS — H2510 Age-related nuclear cataract, unspecified eye: Secondary | ICD-10-CM | POA: Insufficient documentation

## 2015-10-17 HISTORY — DX: Age-related nuclear cataract, unspecified eye: H25.10

## 2015-10-23 DIAGNOSIS — E039 Hypothyroidism, unspecified: Secondary | ICD-10-CM | POA: Diagnosis not present

## 2015-10-23 DIAGNOSIS — I509 Heart failure, unspecified: Secondary | ICD-10-CM | POA: Diagnosis not present

## 2015-10-23 DIAGNOSIS — Z950 Presence of cardiac pacemaker: Secondary | ICD-10-CM | POA: Diagnosis not present

## 2015-10-23 DIAGNOSIS — I252 Old myocardial infarction: Secondary | ICD-10-CM | POA: Diagnosis not present

## 2015-10-23 DIAGNOSIS — I25810 Atherosclerosis of coronary artery bypass graft(s) without angina pectoris: Secondary | ICD-10-CM | POA: Diagnosis not present

## 2015-10-23 DIAGNOSIS — Z79899 Other long term (current) drug therapy: Secondary | ICD-10-CM | POA: Diagnosis not present

## 2015-10-23 DIAGNOSIS — I110 Hypertensive heart disease with heart failure: Secondary | ICD-10-CM | POA: Diagnosis not present

## 2015-10-23 DIAGNOSIS — H2511 Age-related nuclear cataract, right eye: Secondary | ICD-10-CM | POA: Diagnosis not present

## 2015-10-23 DIAGNOSIS — I4891 Unspecified atrial fibrillation: Secondary | ICD-10-CM | POA: Diagnosis not present

## 2015-10-23 DIAGNOSIS — Z7901 Long term (current) use of anticoagulants: Secondary | ICD-10-CM | POA: Diagnosis not present

## 2015-10-24 DIAGNOSIS — H2512 Age-related nuclear cataract, left eye: Secondary | ICD-10-CM | POA: Diagnosis not present

## 2015-10-31 DIAGNOSIS — H2511 Age-related nuclear cataract, right eye: Secondary | ICD-10-CM | POA: Diagnosis not present

## 2015-11-14 DIAGNOSIS — Z7901 Long term (current) use of anticoagulants: Secondary | ICD-10-CM | POA: Diagnosis not present

## 2015-11-14 DIAGNOSIS — Z952 Presence of prosthetic heart valve: Secondary | ICD-10-CM | POA: Diagnosis not present

## 2015-11-15 DIAGNOSIS — I498 Other specified cardiac arrhythmias: Secondary | ICD-10-CM | POA: Diagnosis not present

## 2015-11-15 DIAGNOSIS — Z45010 Encounter for checking and testing of cardiac pacemaker pulse generator [battery]: Secondary | ICD-10-CM | POA: Diagnosis not present

## 2015-11-27 DIAGNOSIS — I252 Old myocardial infarction: Secondary | ICD-10-CM | POA: Diagnosis not present

## 2015-11-27 DIAGNOSIS — E039 Hypothyroidism, unspecified: Secondary | ICD-10-CM | POA: Diagnosis not present

## 2015-11-27 DIAGNOSIS — Z888 Allergy status to other drugs, medicaments and biological substances status: Secondary | ICD-10-CM | POA: Diagnosis not present

## 2015-11-27 DIAGNOSIS — M1990 Unspecified osteoarthritis, unspecified site: Secondary | ICD-10-CM | POA: Diagnosis not present

## 2015-11-27 DIAGNOSIS — I110 Hypertensive heart disease with heart failure: Secondary | ICD-10-CM | POA: Diagnosis not present

## 2015-11-27 DIAGNOSIS — I509 Heart failure, unspecified: Secondary | ICD-10-CM | POA: Diagnosis not present

## 2015-11-27 DIAGNOSIS — Z7901 Long term (current) use of anticoagulants: Secondary | ICD-10-CM | POA: Diagnosis not present

## 2015-11-27 DIAGNOSIS — H2512 Age-related nuclear cataract, left eye: Secondary | ICD-10-CM | POA: Diagnosis not present

## 2015-11-27 DIAGNOSIS — Z79899 Other long term (current) drug therapy: Secondary | ICD-10-CM | POA: Diagnosis not present

## 2015-11-27 DIAGNOSIS — I25810 Atherosclerosis of coronary artery bypass graft(s) without angina pectoris: Secondary | ICD-10-CM | POA: Diagnosis not present

## 2015-11-28 DIAGNOSIS — H25043 Posterior subcapsular polar age-related cataract, bilateral: Secondary | ICD-10-CM | POA: Diagnosis not present

## 2015-12-04 DIAGNOSIS — N390 Urinary tract infection, site not specified: Secondary | ICD-10-CM | POA: Diagnosis not present

## 2015-12-04 DIAGNOSIS — H25043 Posterior subcapsular polar age-related cataract, bilateral: Secondary | ICD-10-CM | POA: Diagnosis not present

## 2016-01-06 DIAGNOSIS — E7800 Pure hypercholesterolemia, unspecified: Secondary | ICD-10-CM | POA: Diagnosis not present

## 2016-01-06 DIAGNOSIS — R69 Illness, unspecified: Secondary | ICD-10-CM | POA: Diagnosis not present

## 2016-01-06 DIAGNOSIS — K219 Gastro-esophageal reflux disease without esophagitis: Secondary | ICD-10-CM | POA: Diagnosis not present

## 2016-01-06 DIAGNOSIS — I10 Essential (primary) hypertension: Secondary | ICD-10-CM | POA: Diagnosis not present

## 2016-01-12 DIAGNOSIS — Z952 Presence of prosthetic heart valve: Secondary | ICD-10-CM | POA: Diagnosis not present

## 2016-01-12 DIAGNOSIS — Z7901 Long term (current) use of anticoagulants: Secondary | ICD-10-CM | POA: Diagnosis not present

## 2016-03-09 DIAGNOSIS — Z952 Presence of prosthetic heart valve: Secondary | ICD-10-CM | POA: Diagnosis not present

## 2016-03-09 DIAGNOSIS — Z7901 Long term (current) use of anticoagulants: Secondary | ICD-10-CM | POA: Diagnosis not present

## 2016-05-04 DIAGNOSIS — Z950 Presence of cardiac pacemaker: Secondary | ICD-10-CM | POA: Diagnosis not present

## 2016-05-18 DIAGNOSIS — Z952 Presence of prosthetic heart valve: Secondary | ICD-10-CM | POA: Diagnosis not present

## 2016-05-18 DIAGNOSIS — Z7901 Long term (current) use of anticoagulants: Secondary | ICD-10-CM | POA: Diagnosis not present

## 2016-07-03 DIAGNOSIS — Z7901 Long term (current) use of anticoagulants: Secondary | ICD-10-CM | POA: Diagnosis not present

## 2016-07-03 DIAGNOSIS — Z952 Presence of prosthetic heart valve: Secondary | ICD-10-CM | POA: Diagnosis not present

## 2016-07-20 DIAGNOSIS — Z7901 Long term (current) use of anticoagulants: Secondary | ICD-10-CM | POA: Diagnosis not present

## 2016-07-20 DIAGNOSIS — G4700 Insomnia, unspecified: Secondary | ICD-10-CM | POA: Diagnosis not present

## 2016-07-20 DIAGNOSIS — Z952 Presence of prosthetic heart valve: Secondary | ICD-10-CM | POA: Diagnosis not present

## 2016-07-20 DIAGNOSIS — Z9181 History of falling: Secondary | ICD-10-CM | POA: Diagnosis not present

## 2016-07-20 DIAGNOSIS — E039 Hypothyroidism, unspecified: Secondary | ICD-10-CM | POA: Diagnosis not present

## 2016-07-20 DIAGNOSIS — R69 Illness, unspecified: Secondary | ICD-10-CM | POA: Diagnosis not present

## 2016-07-20 DIAGNOSIS — Z79899 Other long term (current) drug therapy: Secondary | ICD-10-CM | POA: Diagnosis not present

## 2016-07-20 DIAGNOSIS — Z1389 Encounter for screening for other disorder: Secondary | ICD-10-CM | POA: Diagnosis not present

## 2016-07-20 DIAGNOSIS — K219 Gastro-esophageal reflux disease without esophagitis: Secondary | ICD-10-CM | POA: Diagnosis not present

## 2016-08-03 DIAGNOSIS — Z950 Presence of cardiac pacemaker: Secondary | ICD-10-CM | POA: Diagnosis not present

## 2016-08-17 DIAGNOSIS — Z952 Presence of prosthetic heart valve: Secondary | ICD-10-CM | POA: Diagnosis not present

## 2016-08-17 DIAGNOSIS — Z7901 Long term (current) use of anticoagulants: Secondary | ICD-10-CM | POA: Diagnosis not present

## 2016-09-02 DIAGNOSIS — J329 Chronic sinusitis, unspecified: Secondary | ICD-10-CM | POA: Diagnosis not present

## 2016-09-29 DIAGNOSIS — Z7901 Long term (current) use of anticoagulants: Secondary | ICD-10-CM | POA: Diagnosis not present

## 2016-09-29 DIAGNOSIS — Z952 Presence of prosthetic heart valve: Secondary | ICD-10-CM | POA: Diagnosis not present

## 2016-11-05 DIAGNOSIS — Z7901 Long term (current) use of anticoagulants: Secondary | ICD-10-CM | POA: Diagnosis not present

## 2016-11-05 DIAGNOSIS — Z952 Presence of prosthetic heart valve: Secondary | ICD-10-CM | POA: Diagnosis not present

## 2016-11-09 DIAGNOSIS — Z950 Presence of cardiac pacemaker: Secondary | ICD-10-CM | POA: Diagnosis not present

## 2016-12-01 DIAGNOSIS — I110 Hypertensive heart disease with heart failure: Secondary | ICD-10-CM | POA: Diagnosis not present

## 2016-12-01 DIAGNOSIS — K219 Gastro-esophageal reflux disease without esophagitis: Secondary | ICD-10-CM | POA: Diagnosis not present

## 2016-12-01 DIAGNOSIS — Z0000 Encounter for general adult medical examination without abnormal findings: Secondary | ICD-10-CM | POA: Diagnosis not present

## 2016-12-01 DIAGNOSIS — Z7901 Long term (current) use of anticoagulants: Secondary | ICD-10-CM | POA: Diagnosis not present

## 2016-12-01 DIAGNOSIS — I509 Heart failure, unspecified: Secondary | ICD-10-CM | POA: Diagnosis not present

## 2016-12-01 DIAGNOSIS — I4891 Unspecified atrial fibrillation: Secondary | ICD-10-CM | POA: Diagnosis not present

## 2016-12-01 DIAGNOSIS — E876 Hypokalemia: Secondary | ICD-10-CM | POA: Diagnosis not present

## 2016-12-01 DIAGNOSIS — E7800 Pure hypercholesterolemia, unspecified: Secondary | ICD-10-CM | POA: Diagnosis not present

## 2016-12-01 DIAGNOSIS — G4700 Insomnia, unspecified: Secondary | ICD-10-CM | POA: Diagnosis not present

## 2016-12-01 DIAGNOSIS — I712 Thoracic aortic aneurysm, without rupture: Secondary | ICD-10-CM | POA: Diagnosis not present

## 2016-12-01 DIAGNOSIS — R69 Illness, unspecified: Secondary | ICD-10-CM | POA: Diagnosis not present

## 2017-01-04 DIAGNOSIS — Z952 Presence of prosthetic heart valve: Secondary | ICD-10-CM | POA: Diagnosis not present

## 2017-01-04 DIAGNOSIS — Z7901 Long term (current) use of anticoagulants: Secondary | ICD-10-CM | POA: Diagnosis not present

## 2017-01-07 DIAGNOSIS — M8580 Other specified disorders of bone density and structure, unspecified site: Secondary | ICD-10-CM | POA: Diagnosis not present

## 2017-01-07 DIAGNOSIS — I10 Essential (primary) hypertension: Secondary | ICD-10-CM | POA: Diagnosis not present

## 2017-01-07 DIAGNOSIS — R69 Illness, unspecified: Secondary | ICD-10-CM | POA: Diagnosis not present

## 2017-01-07 DIAGNOSIS — Z79899 Other long term (current) drug therapy: Secondary | ICD-10-CM | POA: Diagnosis not present

## 2017-01-07 DIAGNOSIS — Z952 Presence of prosthetic heart valve: Secondary | ICD-10-CM | POA: Diagnosis not present

## 2017-01-07 DIAGNOSIS — G4700 Insomnia, unspecified: Secondary | ICD-10-CM | POA: Diagnosis not present

## 2017-01-07 DIAGNOSIS — E039 Hypothyroidism, unspecified: Secondary | ICD-10-CM | POA: Diagnosis not present

## 2017-02-08 DIAGNOSIS — Z950 Presence of cardiac pacemaker: Secondary | ICD-10-CM | POA: Diagnosis not present

## 2017-02-15 DIAGNOSIS — Z7901 Long term (current) use of anticoagulants: Secondary | ICD-10-CM | POA: Diagnosis not present

## 2017-02-15 DIAGNOSIS — Z952 Presence of prosthetic heart valve: Secondary | ICD-10-CM | POA: Diagnosis not present

## 2017-03-22 DIAGNOSIS — Z952 Presence of prosthetic heart valve: Secondary | ICD-10-CM | POA: Diagnosis not present

## 2017-03-22 DIAGNOSIS — Z7901 Long term (current) use of anticoagulants: Secondary | ICD-10-CM | POA: Diagnosis not present

## 2017-04-02 DIAGNOSIS — J329 Chronic sinusitis, unspecified: Secondary | ICD-10-CM | POA: Diagnosis not present

## 2017-04-02 DIAGNOSIS — Z6829 Body mass index (BMI) 29.0-29.9, adult: Secondary | ICD-10-CM | POA: Diagnosis not present

## 2017-04-25 DIAGNOSIS — Z7901 Long term (current) use of anticoagulants: Secondary | ICD-10-CM | POA: Diagnosis not present

## 2017-04-25 DIAGNOSIS — Z952 Presence of prosthetic heart valve: Secondary | ICD-10-CM | POA: Diagnosis not present

## 2017-05-25 DIAGNOSIS — Z7901 Long term (current) use of anticoagulants: Secondary | ICD-10-CM | POA: Diagnosis not present

## 2017-05-25 DIAGNOSIS — Z952 Presence of prosthetic heart valve: Secondary | ICD-10-CM | POA: Diagnosis not present

## 2017-06-28 DIAGNOSIS — Z79899 Other long term (current) drug therapy: Secondary | ICD-10-CM | POA: Diagnosis not present

## 2017-06-28 DIAGNOSIS — K219 Gastro-esophageal reflux disease without esophagitis: Secondary | ICD-10-CM | POA: Diagnosis not present

## 2017-06-28 DIAGNOSIS — G4700 Insomnia, unspecified: Secondary | ICD-10-CM | POA: Diagnosis not present

## 2017-06-28 DIAGNOSIS — Z0000 Encounter for general adult medical examination without abnormal findings: Secondary | ICD-10-CM | POA: Diagnosis not present

## 2017-06-28 DIAGNOSIS — Z7901 Long term (current) use of anticoagulants: Secondary | ICD-10-CM | POA: Diagnosis not present

## 2017-06-28 DIAGNOSIS — Z6828 Body mass index (BMI) 28.0-28.9, adult: Secondary | ICD-10-CM | POA: Diagnosis not present

## 2017-06-28 DIAGNOSIS — E039 Hypothyroidism, unspecified: Secondary | ICD-10-CM | POA: Diagnosis not present

## 2017-06-28 DIAGNOSIS — R69 Illness, unspecified: Secondary | ICD-10-CM | POA: Diagnosis not present

## 2017-07-06 DIAGNOSIS — Z952 Presence of prosthetic heart valve: Secondary | ICD-10-CM | POA: Diagnosis not present

## 2017-07-06 DIAGNOSIS — Z7901 Long term (current) use of anticoagulants: Secondary | ICD-10-CM | POA: Diagnosis not present

## 2017-07-07 DIAGNOSIS — Z6828 Body mass index (BMI) 28.0-28.9, adult: Secondary | ICD-10-CM | POA: Diagnosis not present

## 2017-07-07 DIAGNOSIS — J0100 Acute maxillary sinusitis, unspecified: Secondary | ICD-10-CM | POA: Diagnosis not present

## 2017-07-16 DIAGNOSIS — Z6828 Body mass index (BMI) 28.0-28.9, adult: Secondary | ICD-10-CM | POA: Diagnosis not present

## 2017-07-16 DIAGNOSIS — J40 Bronchitis, not specified as acute or chronic: Secondary | ICD-10-CM | POA: Diagnosis not present

## 2017-07-16 DIAGNOSIS — I10 Essential (primary) hypertension: Secondary | ICD-10-CM | POA: Diagnosis not present

## 2017-08-12 DIAGNOSIS — Z7901 Long term (current) use of anticoagulants: Secondary | ICD-10-CM | POA: Diagnosis not present

## 2017-08-12 DIAGNOSIS — Z952 Presence of prosthetic heart valve: Secondary | ICD-10-CM | POA: Diagnosis not present

## 2017-11-15 DIAGNOSIS — Z952 Presence of prosthetic heart valve: Secondary | ICD-10-CM | POA: Diagnosis not present

## 2017-11-15 DIAGNOSIS — Z7901 Long term (current) use of anticoagulants: Secondary | ICD-10-CM | POA: Diagnosis not present

## 2017-12-13 DIAGNOSIS — Z7901 Long term (current) use of anticoagulants: Secondary | ICD-10-CM | POA: Diagnosis not present

## 2017-12-13 DIAGNOSIS — I719 Aortic aneurysm of unspecified site, without rupture: Secondary | ICD-10-CM | POA: Diagnosis not present

## 2017-12-13 DIAGNOSIS — Z6828 Body mass index (BMI) 28.0-28.9, adult: Secondary | ICD-10-CM | POA: Diagnosis not present

## 2017-12-13 DIAGNOSIS — G4700 Insomnia, unspecified: Secondary | ICD-10-CM | POA: Diagnosis not present

## 2017-12-13 DIAGNOSIS — R69 Illness, unspecified: Secondary | ICD-10-CM | POA: Diagnosis not present

## 2017-12-13 DIAGNOSIS — Z79899 Other long term (current) drug therapy: Secondary | ICD-10-CM | POA: Diagnosis not present

## 2017-12-13 DIAGNOSIS — E039 Hypothyroidism, unspecified: Secondary | ICD-10-CM | POA: Diagnosis not present

## 2017-12-13 DIAGNOSIS — I10 Essential (primary) hypertension: Secondary | ICD-10-CM | POA: Diagnosis not present

## 2017-12-13 DIAGNOSIS — Z950 Presence of cardiac pacemaker: Secondary | ICD-10-CM | POA: Diagnosis not present

## 2017-12-13 DIAGNOSIS — M545 Low back pain: Secondary | ICD-10-CM | POA: Diagnosis not present

## 2017-12-27 DIAGNOSIS — Z7901 Long term (current) use of anticoagulants: Secondary | ICD-10-CM | POA: Diagnosis not present

## 2017-12-27 DIAGNOSIS — Z952 Presence of prosthetic heart valve: Secondary | ICD-10-CM | POA: Diagnosis not present

## 2017-12-28 DIAGNOSIS — Z950 Presence of cardiac pacemaker: Secondary | ICD-10-CM | POA: Diagnosis not present

## 2018-01-12 DIAGNOSIS — I712 Thoracic aortic aneurysm, without rupture: Secondary | ICD-10-CM | POA: Diagnosis not present

## 2018-01-24 DIAGNOSIS — Z6827 Body mass index (BMI) 27.0-27.9, adult: Secondary | ICD-10-CM | POA: Diagnosis not present

## 2018-01-24 DIAGNOSIS — M546 Pain in thoracic spine: Secondary | ICD-10-CM | POA: Diagnosis not present

## 2018-01-24 DIAGNOSIS — S22078A Other fracture of T9-T10 vertebra, initial encounter for closed fracture: Secondary | ICD-10-CM | POA: Diagnosis not present

## 2018-02-18 DIAGNOSIS — Z952 Presence of prosthetic heart valve: Secondary | ICD-10-CM | POA: Diagnosis not present

## 2018-02-18 DIAGNOSIS — Z7901 Long term (current) use of anticoagulants: Secondary | ICD-10-CM | POA: Diagnosis not present

## 2018-02-22 DIAGNOSIS — R0781 Pleurodynia: Secondary | ICD-10-CM | POA: Diagnosis not present

## 2018-02-22 DIAGNOSIS — R05 Cough: Secondary | ICD-10-CM | POA: Diagnosis not present

## 2018-02-22 DIAGNOSIS — R0602 Shortness of breath: Secondary | ICD-10-CM | POA: Diagnosis not present

## 2018-02-22 DIAGNOSIS — Z7952 Long term (current) use of systemic steroids: Secondary | ICD-10-CM | POA: Diagnosis not present

## 2018-02-22 DIAGNOSIS — I252 Old myocardial infarction: Secondary | ICD-10-CM | POA: Diagnosis not present

## 2018-02-22 DIAGNOSIS — M546 Pain in thoracic spine: Secondary | ICD-10-CM | POA: Diagnosis not present

## 2018-02-22 DIAGNOSIS — Z79899 Other long term (current) drug therapy: Secondary | ICD-10-CM | POA: Diagnosis not present

## 2018-02-22 DIAGNOSIS — Z950 Presence of cardiac pacemaker: Secondary | ICD-10-CM | POA: Diagnosis not present

## 2018-03-07 DIAGNOSIS — Z6827 Body mass index (BMI) 27.0-27.9, adult: Secondary | ICD-10-CM | POA: Diagnosis not present

## 2018-03-07 DIAGNOSIS — M546 Pain in thoracic spine: Secondary | ICD-10-CM | POA: Diagnosis not present

## 2018-03-07 DIAGNOSIS — S22079A Unspecified fracture of T9-T10 vertebra, initial encounter for closed fracture: Secondary | ICD-10-CM | POA: Diagnosis not present

## 2018-03-07 DIAGNOSIS — M8000XS Age-related osteoporosis with current pathological fracture, unspecified site, sequela: Secondary | ICD-10-CM | POA: Diagnosis not present

## 2018-03-25 DIAGNOSIS — Z7901 Long term (current) use of anticoagulants: Secondary | ICD-10-CM | POA: Diagnosis not present

## 2018-03-25 DIAGNOSIS — Z952 Presence of prosthetic heart valve: Secondary | ICD-10-CM | POA: Diagnosis not present

## 2018-05-24 DIAGNOSIS — R69 Illness, unspecified: Secondary | ICD-10-CM | POA: Diagnosis not present

## 2018-05-24 DIAGNOSIS — Z6827 Body mass index (BMI) 27.0-27.9, adult: Secondary | ICD-10-CM | POA: Diagnosis not present

## 2018-05-24 DIAGNOSIS — R300 Dysuria: Secondary | ICD-10-CM | POA: Diagnosis not present

## 2018-05-29 DIAGNOSIS — Z952 Presence of prosthetic heart valve: Secondary | ICD-10-CM | POA: Diagnosis not present

## 2018-05-29 DIAGNOSIS — Z7901 Long term (current) use of anticoagulants: Secondary | ICD-10-CM | POA: Diagnosis not present

## 2018-07-14 DIAGNOSIS — E039 Hypothyroidism, unspecified: Secondary | ICD-10-CM | POA: Diagnosis not present

## 2018-07-14 DIAGNOSIS — Z6826 Body mass index (BMI) 26.0-26.9, adult: Secondary | ICD-10-CM | POA: Diagnosis not present

## 2018-07-14 DIAGNOSIS — I10 Essential (primary) hypertension: Secondary | ICD-10-CM | POA: Diagnosis not present

## 2018-07-14 DIAGNOSIS — Z0000 Encounter for general adult medical examination without abnormal findings: Secondary | ICD-10-CM | POA: Diagnosis not present

## 2018-07-14 DIAGNOSIS — Z79899 Other long term (current) drug therapy: Secondary | ICD-10-CM | POA: Diagnosis not present

## 2018-07-14 DIAGNOSIS — Z7901 Long term (current) use of anticoagulants: Secondary | ICD-10-CM | POA: Diagnosis not present

## 2018-07-14 DIAGNOSIS — Z950 Presence of cardiac pacemaker: Secondary | ICD-10-CM | POA: Diagnosis not present

## 2018-07-14 DIAGNOSIS — K219 Gastro-esophageal reflux disease without esophagitis: Secondary | ICD-10-CM | POA: Diagnosis not present

## 2018-09-19 DIAGNOSIS — Z6828 Body mass index (BMI) 28.0-28.9, adult: Secondary | ICD-10-CM | POA: Diagnosis not present

## 2018-09-19 DIAGNOSIS — Z23 Encounter for immunization: Secondary | ICD-10-CM | POA: Diagnosis not present

## 2018-09-19 DIAGNOSIS — L259 Unspecified contact dermatitis, unspecified cause: Secondary | ICD-10-CM | POA: Diagnosis not present

## 2018-09-19 DIAGNOSIS — Z2821 Immunization not carried out because of patient refusal: Secondary | ICD-10-CM | POA: Diagnosis not present

## 2018-10-06 DIAGNOSIS — J329 Chronic sinusitis, unspecified: Secondary | ICD-10-CM | POA: Diagnosis not present

## 2018-10-06 DIAGNOSIS — J40 Bronchitis, not specified as acute or chronic: Secondary | ICD-10-CM | POA: Diagnosis not present

## 2018-12-12 DIAGNOSIS — I10 Essential (primary) hypertension: Secondary | ICD-10-CM | POA: Diagnosis not present

## 2018-12-12 DIAGNOSIS — Z79899 Other long term (current) drug therapy: Secondary | ICD-10-CM | POA: Diagnosis not present

## 2018-12-12 DIAGNOSIS — G4700 Insomnia, unspecified: Secondary | ICD-10-CM | POA: Diagnosis not present

## 2018-12-12 DIAGNOSIS — E039 Hypothyroidism, unspecified: Secondary | ICD-10-CM | POA: Diagnosis not present

## 2018-12-12 DIAGNOSIS — Z7901 Long term (current) use of anticoagulants: Secondary | ICD-10-CM | POA: Diagnosis not present

## 2018-12-12 DIAGNOSIS — Z6827 Body mass index (BMI) 27.0-27.9, adult: Secondary | ICD-10-CM | POA: Diagnosis not present

## 2019-04-04 DIAGNOSIS — Z6827 Body mass index (BMI) 27.0-27.9, adult: Secondary | ICD-10-CM | POA: Diagnosis not present

## 2019-04-04 DIAGNOSIS — B029 Zoster without complications: Secondary | ICD-10-CM | POA: Diagnosis not present

## 2019-08-31 DIAGNOSIS — Z0000 Encounter for general adult medical examination without abnormal findings: Secondary | ICD-10-CM | POA: Diagnosis not present

## 2019-08-31 DIAGNOSIS — Z2821 Immunization not carried out because of patient refusal: Secondary | ICD-10-CM | POA: Diagnosis not present

## 2019-08-31 DIAGNOSIS — Z6828 Body mass index (BMI) 28.0-28.9, adult: Secondary | ICD-10-CM | POA: Diagnosis not present

## 2019-08-31 DIAGNOSIS — Z7901 Long term (current) use of anticoagulants: Secondary | ICD-10-CM | POA: Diagnosis not present

## 2019-08-31 DIAGNOSIS — Z1331 Encounter for screening for depression: Secondary | ICD-10-CM | POA: Diagnosis not present

## 2019-08-31 DIAGNOSIS — I10 Essential (primary) hypertension: Secondary | ICD-10-CM | POA: Diagnosis not present

## 2019-08-31 DIAGNOSIS — R69 Illness, unspecified: Secondary | ICD-10-CM | POA: Diagnosis not present

## 2019-09-28 DIAGNOSIS — H52209 Unspecified astigmatism, unspecified eye: Secondary | ICD-10-CM | POA: Diagnosis not present

## 2019-09-28 DIAGNOSIS — H52223 Regular astigmatism, bilateral: Secondary | ICD-10-CM | POA: Diagnosis not present

## 2019-10-09 DIAGNOSIS — J329 Chronic sinusitis, unspecified: Secondary | ICD-10-CM | POA: Diagnosis not present

## 2019-10-09 DIAGNOSIS — Z6828 Body mass index (BMI) 28.0-28.9, adult: Secondary | ICD-10-CM | POA: Diagnosis not present

## 2019-10-24 DIAGNOSIS — J40 Bronchitis, not specified as acute or chronic: Secondary | ICD-10-CM | POA: Diagnosis not present

## 2019-11-09 DIAGNOSIS — Z952 Presence of prosthetic heart valve: Secondary | ICD-10-CM | POA: Diagnosis not present

## 2019-11-09 DIAGNOSIS — Z7901 Long term (current) use of anticoagulants: Secondary | ICD-10-CM | POA: Diagnosis not present

## 2019-11-15 DIAGNOSIS — J40 Bronchitis, not specified as acute or chronic: Secondary | ICD-10-CM | POA: Diagnosis not present

## 2019-12-06 DIAGNOSIS — I719 Aortic aneurysm of unspecified site, without rupture: Secondary | ICD-10-CM | POA: Diagnosis not present

## 2019-12-06 DIAGNOSIS — Z952 Presence of prosthetic heart valve: Secondary | ICD-10-CM | POA: Diagnosis not present

## 2019-12-06 DIAGNOSIS — R05 Cough: Secondary | ICD-10-CM | POA: Diagnosis not present

## 2019-12-09 DIAGNOSIS — Z7901 Long term (current) use of anticoagulants: Secondary | ICD-10-CM | POA: Diagnosis not present

## 2019-12-09 DIAGNOSIS — Z952 Presence of prosthetic heart valve: Secondary | ICD-10-CM | POA: Diagnosis not present

## 2019-12-11 DIAGNOSIS — I700 Atherosclerosis of aorta: Secondary | ICD-10-CM | POA: Diagnosis not present

## 2019-12-11 DIAGNOSIS — J90 Pleural effusion, not elsewhere classified: Secondary | ICD-10-CM | POA: Diagnosis not present

## 2019-12-11 DIAGNOSIS — I712 Thoracic aortic aneurysm, without rupture: Secondary | ICD-10-CM | POA: Diagnosis not present

## 2019-12-12 DIAGNOSIS — E782 Mixed hyperlipidemia: Secondary | ICD-10-CM | POA: Insufficient documentation

## 2019-12-12 HISTORY — DX: Mixed hyperlipidemia: E78.2

## 2019-12-20 DIAGNOSIS — I420 Dilated cardiomyopathy: Secondary | ICD-10-CM | POA: Diagnosis not present

## 2019-12-20 DIAGNOSIS — I2510 Atherosclerotic heart disease of native coronary artery without angina pectoris: Secondary | ICD-10-CM | POA: Diagnosis not present

## 2019-12-20 DIAGNOSIS — Z952 Presence of prosthetic heart valve: Secondary | ICD-10-CM | POA: Diagnosis not present

## 2019-12-20 DIAGNOSIS — I350 Nonrheumatic aortic (valve) stenosis: Secondary | ICD-10-CM | POA: Diagnosis not present

## 2019-12-20 DIAGNOSIS — R0602 Shortness of breath: Secondary | ICD-10-CM | POA: Diagnosis not present

## 2020-01-08 DIAGNOSIS — Z7901 Long term (current) use of anticoagulants: Secondary | ICD-10-CM | POA: Diagnosis not present

## 2020-01-08 DIAGNOSIS — Z952 Presence of prosthetic heart valve: Secondary | ICD-10-CM | POA: Diagnosis not present

## 2020-02-07 DIAGNOSIS — Z7901 Long term (current) use of anticoagulants: Secondary | ICD-10-CM | POA: Diagnosis not present

## 2020-02-07 DIAGNOSIS — Z952 Presence of prosthetic heart valve: Secondary | ICD-10-CM | POA: Diagnosis not present

## 2020-02-16 DIAGNOSIS — E039 Hypothyroidism, unspecified: Secondary | ICD-10-CM | POA: Diagnosis not present

## 2020-02-16 DIAGNOSIS — I10 Essential (primary) hypertension: Secondary | ICD-10-CM | POA: Diagnosis not present

## 2020-03-08 DIAGNOSIS — Z952 Presence of prosthetic heart valve: Secondary | ICD-10-CM | POA: Diagnosis not present

## 2020-03-08 DIAGNOSIS — Z7901 Long term (current) use of anticoagulants: Secondary | ICD-10-CM | POA: Diagnosis not present

## 2020-04-07 DIAGNOSIS — Z952 Presence of prosthetic heart valve: Secondary | ICD-10-CM | POA: Diagnosis not present

## 2020-04-07 DIAGNOSIS — Z7901 Long term (current) use of anticoagulants: Secondary | ICD-10-CM | POA: Diagnosis not present

## 2020-05-07 DIAGNOSIS — Z952 Presence of prosthetic heart valve: Secondary | ICD-10-CM | POA: Diagnosis not present

## 2020-05-07 DIAGNOSIS — Z7901 Long term (current) use of anticoagulants: Secondary | ICD-10-CM | POA: Diagnosis not present

## 2020-06-03 DIAGNOSIS — I10 Essential (primary) hypertension: Secondary | ICD-10-CM | POA: Diagnosis not present

## 2020-06-03 DIAGNOSIS — J40 Bronchitis, not specified as acute or chronic: Secondary | ICD-10-CM | POA: Diagnosis not present

## 2020-06-03 DIAGNOSIS — E039 Hypothyroidism, unspecified: Secondary | ICD-10-CM | POA: Diagnosis not present

## 2020-06-03 DIAGNOSIS — Z79899 Other long term (current) drug therapy: Secondary | ICD-10-CM | POA: Diagnosis not present

## 2020-07-31 DIAGNOSIS — N3091 Cystitis, unspecified with hematuria: Secondary | ICD-10-CM | POA: Diagnosis not present

## 2020-07-31 DIAGNOSIS — K5909 Other constipation: Secondary | ICD-10-CM | POA: Diagnosis not present

## 2020-08-20 DIAGNOSIS — Z7901 Long term (current) use of anticoagulants: Secondary | ICD-10-CM | POA: Diagnosis not present

## 2020-08-20 DIAGNOSIS — Z952 Presence of prosthetic heart valve: Secondary | ICD-10-CM | POA: Diagnosis not present

## 2020-10-18 DIAGNOSIS — Z952 Presence of prosthetic heart valve: Secondary | ICD-10-CM | POA: Diagnosis not present

## 2020-10-18 DIAGNOSIS — Z7901 Long term (current) use of anticoagulants: Secondary | ICD-10-CM | POA: Diagnosis not present

## 2020-12-06 DIAGNOSIS — M62838 Other muscle spasm: Secondary | ICD-10-CM | POA: Diagnosis not present

## 2020-12-06 DIAGNOSIS — E039 Hypothyroidism, unspecified: Secondary | ICD-10-CM | POA: Diagnosis not present

## 2020-12-06 DIAGNOSIS — Z0000 Encounter for general adult medical examination without abnormal findings: Secondary | ICD-10-CM | POA: Diagnosis not present

## 2020-12-06 DIAGNOSIS — Z7901 Long term (current) use of anticoagulants: Secondary | ICD-10-CM | POA: Diagnosis not present

## 2020-12-06 DIAGNOSIS — Z952 Presence of prosthetic heart valve: Secondary | ICD-10-CM | POA: Diagnosis not present

## 2020-12-06 DIAGNOSIS — Z79899 Other long term (current) drug therapy: Secondary | ICD-10-CM | POA: Diagnosis not present

## 2020-12-16 ENCOUNTER — Encounter: Payer: Self-pay | Admitting: General Practice

## 2020-12-28 DIAGNOSIS — J80 Acute respiratory distress syndrome: Secondary | ICD-10-CM | POA: Diagnosis not present

## 2020-12-28 DIAGNOSIS — I350 Nonrheumatic aortic (valve) stenosis: Secondary | ICD-10-CM | POA: Diagnosis not present

## 2020-12-28 DIAGNOSIS — R0609 Other forms of dyspnea: Secondary | ICD-10-CM | POA: Diagnosis not present

## 2020-12-28 DIAGNOSIS — I509 Heart failure, unspecified: Secondary | ICD-10-CM | POA: Diagnosis not present

## 2020-12-28 DIAGNOSIS — I25118 Atherosclerotic heart disease of native coronary artery with other forms of angina pectoris: Secondary | ICD-10-CM | POA: Diagnosis not present

## 2020-12-28 DIAGNOSIS — R0789 Other chest pain: Secondary | ICD-10-CM | POA: Diagnosis not present

## 2020-12-28 DIAGNOSIS — Z952 Presence of prosthetic heart valve: Secondary | ICD-10-CM | POA: Diagnosis not present

## 2020-12-28 DIAGNOSIS — I208 Other forms of angina pectoris: Secondary | ICD-10-CM | POA: Diagnosis not present

## 2020-12-28 DIAGNOSIS — Z954 Presence of other heart-valve replacement: Secondary | ICD-10-CM | POA: Diagnosis not present

## 2020-12-28 DIAGNOSIS — Z8619 Personal history of other infectious and parasitic diseases: Secondary | ICD-10-CM | POA: Diagnosis not present

## 2020-12-28 DIAGNOSIS — R069 Unspecified abnormalities of breathing: Secondary | ICD-10-CM | POA: Diagnosis not present

## 2020-12-28 DIAGNOSIS — Z8616 Personal history of COVID-19: Secondary | ICD-10-CM | POA: Diagnosis not present

## 2020-12-28 DIAGNOSIS — Z955 Presence of coronary angioplasty implant and graft: Secondary | ICD-10-CM | POA: Diagnosis not present

## 2020-12-28 DIAGNOSIS — I517 Cardiomegaly: Secondary | ICD-10-CM | POA: Diagnosis not present

## 2020-12-28 DIAGNOSIS — E039 Hypothyroidism, unspecified: Secondary | ICD-10-CM | POA: Diagnosis not present

## 2020-12-28 DIAGNOSIS — I252 Old myocardial infarction: Secondary | ICD-10-CM | POA: Diagnosis not present

## 2020-12-28 DIAGNOSIS — I444 Left anterior fascicular block: Secondary | ICD-10-CM | POA: Diagnosis not present

## 2020-12-28 DIAGNOSIS — E785 Hyperlipidemia, unspecified: Secondary | ICD-10-CM | POA: Diagnosis not present

## 2020-12-28 DIAGNOSIS — R1310 Dysphagia, unspecified: Secondary | ICD-10-CM | POA: Diagnosis not present

## 2020-12-28 DIAGNOSIS — R609 Edema, unspecified: Secondary | ICD-10-CM | POA: Diagnosis not present

## 2020-12-28 DIAGNOSIS — R0602 Shortness of breath: Secondary | ICD-10-CM | POA: Diagnosis not present

## 2020-12-28 DIAGNOSIS — K219 Gastro-esophageal reflux disease without esophagitis: Secondary | ICD-10-CM | POA: Diagnosis not present

## 2020-12-28 DIAGNOSIS — R0902 Hypoxemia: Secondary | ICD-10-CM | POA: Diagnosis not present

## 2020-12-28 DIAGNOSIS — Z950 Presence of cardiac pacemaker: Secondary | ICD-10-CM | POA: Diagnosis not present

## 2020-12-28 DIAGNOSIS — I4510 Unspecified right bundle-branch block: Secondary | ICD-10-CM | POA: Diagnosis not present

## 2020-12-28 DIAGNOSIS — I110 Hypertensive heart disease with heart failure: Secondary | ICD-10-CM | POA: Diagnosis not present

## 2020-12-28 DIAGNOSIS — I4892 Unspecified atrial flutter: Secondary | ICD-10-CM | POA: Diagnosis not present

## 2020-12-28 DIAGNOSIS — Z7901 Long term (current) use of anticoagulants: Secondary | ICD-10-CM | POA: Diagnosis not present

## 2020-12-31 DIAGNOSIS — Z952 Presence of prosthetic heart valve: Secondary | ICD-10-CM | POA: Diagnosis not present

## 2020-12-31 DIAGNOSIS — Z7901 Long term (current) use of anticoagulants: Secondary | ICD-10-CM | POA: Diagnosis not present

## 2021-01-07 DIAGNOSIS — Z7901 Long term (current) use of anticoagulants: Secondary | ICD-10-CM | POA: Diagnosis not present

## 2021-01-07 DIAGNOSIS — Z09 Encounter for follow-up examination after completed treatment for conditions other than malignant neoplasm: Secondary | ICD-10-CM | POA: Diagnosis not present

## 2021-01-07 DIAGNOSIS — I509 Heart failure, unspecified: Secondary | ICD-10-CM | POA: Diagnosis not present

## 2021-01-07 DIAGNOSIS — R9430 Abnormal result of cardiovascular function study, unspecified: Secondary | ICD-10-CM | POA: Diagnosis not present

## 2021-01-14 ENCOUNTER — Ambulatory Visit: Payer: Medicare Other | Admitting: Cardiology

## 2021-01-14 DIAGNOSIS — U071 COVID-19: Secondary | ICD-10-CM | POA: Diagnosis not present

## 2021-01-14 DIAGNOSIS — Z7901 Long term (current) use of anticoagulants: Secondary | ICD-10-CM | POA: Diagnosis not present

## 2021-01-14 DIAGNOSIS — I2510 Atherosclerotic heart disease of native coronary artery without angina pectoris: Secondary | ICD-10-CM | POA: Diagnosis not present

## 2021-01-14 DIAGNOSIS — Z950 Presence of cardiac pacemaker: Secondary | ICD-10-CM | POA: Diagnosis not present

## 2021-01-14 DIAGNOSIS — I350 Nonrheumatic aortic (valve) stenosis: Secondary | ICD-10-CM | POA: Diagnosis not present

## 2021-01-14 DIAGNOSIS — E782 Mixed hyperlipidemia: Secondary | ICD-10-CM | POA: Diagnosis not present

## 2021-01-14 DIAGNOSIS — I420 Dilated cardiomyopathy: Secondary | ICD-10-CM | POA: Diagnosis not present

## 2021-01-14 DIAGNOSIS — Z952 Presence of prosthetic heart valve: Secondary | ICD-10-CM | POA: Diagnosis not present

## 2021-01-14 DIAGNOSIS — Z789 Other specified health status: Secondary | ICD-10-CM | POA: Diagnosis not present

## 2021-01-14 DIAGNOSIS — I509 Heart failure, unspecified: Secondary | ICD-10-CM | POA: Diagnosis not present

## 2021-01-16 DIAGNOSIS — K5909 Other constipation: Secondary | ICD-10-CM | POA: Diagnosis not present

## 2021-01-23 DIAGNOSIS — I509 Heart failure, unspecified: Secondary | ICD-10-CM | POA: Diagnosis not present

## 2021-01-30 DIAGNOSIS — I420 Dilated cardiomyopathy: Secondary | ICD-10-CM | POA: Diagnosis not present

## 2021-01-30 DIAGNOSIS — I350 Nonrheumatic aortic (valve) stenosis: Secondary | ICD-10-CM | POA: Diagnosis not present

## 2021-01-30 DIAGNOSIS — Z952 Presence of prosthetic heart valve: Secondary | ICD-10-CM | POA: Diagnosis not present

## 2021-01-30 DIAGNOSIS — I2510 Atherosclerotic heart disease of native coronary artery without angina pectoris: Secondary | ICD-10-CM | POA: Diagnosis not present

## 2021-01-30 DIAGNOSIS — I509 Heart failure, unspecified: Secondary | ICD-10-CM | POA: Diagnosis not present

## 2021-01-30 DIAGNOSIS — Z7901 Long term (current) use of anticoagulants: Secondary | ICD-10-CM | POA: Diagnosis not present

## 2021-01-30 DIAGNOSIS — Z45018 Encounter for adjustment and management of other part of cardiac pacemaker: Secondary | ICD-10-CM | POA: Diagnosis not present

## 2021-02-02 DIAGNOSIS — Z952 Presence of prosthetic heart valve: Secondary | ICD-10-CM | POA: Diagnosis not present

## 2021-02-02 DIAGNOSIS — Z7901 Long term (current) use of anticoagulants: Secondary | ICD-10-CM | POA: Diagnosis not present

## 2021-02-13 DIAGNOSIS — E871 Hypo-osmolality and hyponatremia: Secondary | ICD-10-CM | POA: Diagnosis not present

## 2021-03-07 DIAGNOSIS — R0789 Other chest pain: Secondary | ICD-10-CM | POA: Diagnosis not present

## 2021-03-07 DIAGNOSIS — I130 Hypertensive heart and chronic kidney disease with heart failure and stage 1 through stage 4 chronic kidney disease, or unspecified chronic kidney disease: Secondary | ICD-10-CM | POA: Diagnosis not present

## 2021-03-07 DIAGNOSIS — Z882 Allergy status to sulfonamides status: Secondary | ICD-10-CM | POA: Diagnosis not present

## 2021-03-07 DIAGNOSIS — M40204 Unspecified kyphosis, thoracic region: Secondary | ICD-10-CM | POA: Diagnosis not present

## 2021-03-07 DIAGNOSIS — I509 Heart failure, unspecified: Secondary | ICD-10-CM | POA: Diagnosis not present

## 2021-03-07 DIAGNOSIS — I350 Nonrheumatic aortic (valve) stenosis: Secondary | ICD-10-CM | POA: Diagnosis not present

## 2021-03-07 DIAGNOSIS — R0600 Dyspnea, unspecified: Secondary | ICD-10-CM | POA: Diagnosis not present

## 2021-03-07 DIAGNOSIS — I352 Nonrheumatic aortic (valve) stenosis with insufficiency: Secondary | ICD-10-CM | POA: Diagnosis not present

## 2021-03-07 DIAGNOSIS — I517 Cardiomegaly: Secondary | ICD-10-CM | POA: Diagnosis not present

## 2021-03-07 DIAGNOSIS — I110 Hypertensive heart disease with heart failure: Secondary | ICD-10-CM | POA: Diagnosis not present

## 2021-03-07 DIAGNOSIS — Z955 Presence of coronary angioplasty implant and graft: Secondary | ICD-10-CM | POA: Diagnosis not present

## 2021-03-07 DIAGNOSIS — I099 Rheumatic heart disease, unspecified: Secondary | ICD-10-CM | POA: Diagnosis not present

## 2021-03-07 DIAGNOSIS — I359 Nonrheumatic aortic valve disorder, unspecified: Secondary | ICD-10-CM | POA: Diagnosis not present

## 2021-03-07 DIAGNOSIS — R0602 Shortness of breath: Secondary | ICD-10-CM | POA: Diagnosis not present

## 2021-03-07 DIAGNOSIS — M549 Dorsalgia, unspecified: Secondary | ICD-10-CM | POA: Diagnosis not present

## 2021-03-07 DIAGNOSIS — I2510 Atherosclerotic heart disease of native coronary artery without angina pectoris: Secondary | ICD-10-CM | POA: Diagnosis not present

## 2021-03-07 DIAGNOSIS — I060 Rheumatic aortic stenosis: Secondary | ICD-10-CM | POA: Diagnosis not present

## 2021-03-07 DIAGNOSIS — I252 Old myocardial infarction: Secondary | ICD-10-CM | POA: Diagnosis not present

## 2021-03-07 DIAGNOSIS — I5023 Acute on chronic systolic (congestive) heart failure: Secondary | ICD-10-CM | POA: Diagnosis not present

## 2021-03-07 DIAGNOSIS — I519 Heart disease, unspecified: Secondary | ICD-10-CM | POA: Diagnosis not present

## 2021-03-07 DIAGNOSIS — I480 Paroxysmal atrial fibrillation: Secondary | ICD-10-CM | POA: Diagnosis not present

## 2021-03-07 DIAGNOSIS — E785 Hyperlipidemia, unspecified: Secondary | ICD-10-CM | POA: Diagnosis not present

## 2021-03-07 DIAGNOSIS — I219 Acute myocardial infarction, unspecified: Secondary | ICD-10-CM | POA: Diagnosis not present

## 2021-03-07 DIAGNOSIS — Z952 Presence of prosthetic heart valve: Secondary | ICD-10-CM | POA: Diagnosis not present

## 2021-03-07 DIAGNOSIS — E079 Disorder of thyroid, unspecified: Secondary | ICD-10-CM | POA: Diagnosis not present

## 2021-03-07 DIAGNOSIS — J811 Chronic pulmonary edema: Secondary | ICD-10-CM | POA: Diagnosis not present

## 2021-03-07 DIAGNOSIS — I5021 Acute systolic (congestive) heart failure: Secondary | ICD-10-CM | POA: Diagnosis not present

## 2021-03-07 DIAGNOSIS — R778 Other specified abnormalities of plasma proteins: Secondary | ICD-10-CM | POA: Diagnosis not present

## 2021-03-07 DIAGNOSIS — Z20822 Contact with and (suspected) exposure to covid-19: Secondary | ICD-10-CM | POA: Diagnosis not present

## 2021-03-07 DIAGNOSIS — R54 Age-related physical debility: Secondary | ICD-10-CM | POA: Diagnosis not present

## 2021-03-07 DIAGNOSIS — I77810 Thoracic aortic ectasia: Secondary | ICD-10-CM | POA: Diagnosis not present

## 2021-03-07 DIAGNOSIS — I5A Non-ischemic myocardial injury (non-traumatic): Secondary | ICD-10-CM | POA: Diagnosis not present

## 2021-03-07 DIAGNOSIS — G8929 Other chronic pain: Secondary | ICD-10-CM | POA: Diagnosis not present

## 2021-03-07 DIAGNOSIS — N189 Chronic kidney disease, unspecified: Secondary | ICD-10-CM | POA: Diagnosis not present

## 2021-03-07 DIAGNOSIS — Z954 Presence of other heart-valve replacement: Secondary | ICD-10-CM | POA: Diagnosis not present

## 2021-03-07 DIAGNOSIS — I700 Atherosclerosis of aorta: Secondary | ICD-10-CM | POA: Diagnosis not present

## 2021-03-07 DIAGNOSIS — K219 Gastro-esophageal reflux disease without esophagitis: Secondary | ICD-10-CM | POA: Diagnosis not present

## 2021-03-07 DIAGNOSIS — G4700 Insomnia, unspecified: Secondary | ICD-10-CM | POA: Diagnosis not present

## 2021-03-07 DIAGNOSIS — Z7901 Long term (current) use of anticoagulants: Secondary | ICD-10-CM | POA: Diagnosis not present

## 2021-03-07 DIAGNOSIS — I5020 Unspecified systolic (congestive) heart failure: Secondary | ICD-10-CM | POA: Diagnosis not present

## 2021-03-07 DIAGNOSIS — E039 Hypothyroidism, unspecified: Secondary | ICD-10-CM | POA: Diagnosis not present

## 2021-03-07 DIAGNOSIS — M7989 Other specified soft tissue disorders: Secondary | ICD-10-CM | POA: Diagnosis not present

## 2021-03-14 DIAGNOSIS — Z952 Presence of prosthetic heart valve: Secondary | ICD-10-CM | POA: Diagnosis not present

## 2021-03-31 ENCOUNTER — Encounter: Payer: Self-pay | Admitting: *Deleted

## 2021-03-31 ENCOUNTER — Encounter: Payer: Self-pay | Admitting: Cardiology

## 2021-04-03 DIAGNOSIS — Z7901 Long term (current) use of anticoagulants: Secondary | ICD-10-CM | POA: Diagnosis not present

## 2021-04-03 DIAGNOSIS — Z952 Presence of prosthetic heart valve: Secondary | ICD-10-CM | POA: Diagnosis not present

## 2021-05-03 DIAGNOSIS — Z952 Presence of prosthetic heart valve: Secondary | ICD-10-CM | POA: Diagnosis not present

## 2021-05-03 DIAGNOSIS — Z7901 Long term (current) use of anticoagulants: Secondary | ICD-10-CM | POA: Diagnosis not present

## 2021-05-13 DIAGNOSIS — R001 Bradycardia, unspecified: Secondary | ICD-10-CM | POA: Insufficient documentation

## 2021-05-13 DIAGNOSIS — F419 Anxiety disorder, unspecified: Secondary | ICD-10-CM | POA: Insufficient documentation

## 2021-05-13 DIAGNOSIS — K219 Gastro-esophageal reflux disease without esophagitis: Secondary | ICD-10-CM | POA: Insufficient documentation

## 2021-05-13 DIAGNOSIS — I498 Other specified cardiac arrhythmias: Secondary | ICD-10-CM | POA: Insufficient documentation

## 2021-05-13 DIAGNOSIS — I4581 Long QT syndrome: Secondary | ICD-10-CM | POA: Insufficient documentation

## 2021-05-13 DIAGNOSIS — I712 Thoracic aortic aneurysm, without rupture, unspecified: Secondary | ICD-10-CM | POA: Insufficient documentation

## 2021-05-13 DIAGNOSIS — I719 Aortic aneurysm of unspecified site, without rupture: Secondary | ICD-10-CM | POA: Insufficient documentation

## 2021-05-13 DIAGNOSIS — I099 Rheumatic heart disease, unspecified: Secondary | ICD-10-CM | POA: Insufficient documentation

## 2021-05-13 DIAGNOSIS — E039 Hypothyroidism, unspecified: Secondary | ICD-10-CM | POA: Insufficient documentation

## 2021-05-13 HISTORY — DX: Thoracic aortic aneurysm, without rupture, unspecified: I71.20

## 2021-05-16 ENCOUNTER — Ambulatory Visit: Payer: Medicare Other | Admitting: Cardiology

## 2021-05-26 ENCOUNTER — Ambulatory Visit: Payer: Medicare Other | Admitting: Cardiology

## 2021-06-19 DIAGNOSIS — Z7901 Long term (current) use of anticoagulants: Secondary | ICD-10-CM | POA: Diagnosis not present

## 2021-06-19 DIAGNOSIS — E785 Hyperlipidemia, unspecified: Secondary | ICD-10-CM | POA: Diagnosis not present

## 2021-06-19 DIAGNOSIS — E039 Hypothyroidism, unspecified: Secondary | ICD-10-CM | POA: Diagnosis not present

## 2021-06-19 DIAGNOSIS — I10 Essential (primary) hypertension: Secondary | ICD-10-CM | POA: Diagnosis not present

## 2021-06-23 DIAGNOSIS — Z952 Presence of prosthetic heart valve: Secondary | ICD-10-CM | POA: Diagnosis not present

## 2021-06-23 DIAGNOSIS — Z7901 Long term (current) use of anticoagulants: Secondary | ICD-10-CM | POA: Diagnosis not present

## 2021-07-21 DIAGNOSIS — I060 Rheumatic aortic stenosis: Secondary | ICD-10-CM | POA: Diagnosis not present

## 2021-07-21 DIAGNOSIS — J90 Pleural effusion, not elsewhere classified: Secondary | ICD-10-CM | POA: Diagnosis not present

## 2021-07-21 DIAGNOSIS — Z87891 Personal history of nicotine dependence: Secondary | ICD-10-CM | POA: Diagnosis not present

## 2021-07-21 DIAGNOSIS — I272 Pulmonary hypertension, unspecified: Secondary | ICD-10-CM | POA: Diagnosis not present

## 2021-07-21 DIAGNOSIS — Z79899 Other long term (current) drug therapy: Secondary | ICD-10-CM | POA: Diagnosis not present

## 2021-07-21 DIAGNOSIS — I252 Old myocardial infarction: Secondary | ICD-10-CM | POA: Diagnosis not present

## 2021-07-21 DIAGNOSIS — I700 Atherosclerosis of aorta: Secondary | ICD-10-CM | POA: Diagnosis not present

## 2021-07-21 DIAGNOSIS — I517 Cardiomegaly: Secondary | ICD-10-CM | POA: Diagnosis not present

## 2021-07-21 DIAGNOSIS — I110 Hypertensive heart disease with heart failure: Secondary | ICD-10-CM | POA: Diagnosis not present

## 2021-07-21 DIAGNOSIS — J9811 Atelectasis: Secondary | ICD-10-CM | POA: Diagnosis not present

## 2021-07-21 DIAGNOSIS — Z952 Presence of prosthetic heart valve: Secondary | ICD-10-CM | POA: Diagnosis not present

## 2021-07-21 DIAGNOSIS — I7121 Aneurysm of the ascending aorta, without rupture: Secondary | ICD-10-CM | POA: Diagnosis not present

## 2021-07-21 DIAGNOSIS — Z955 Presence of coronary angioplasty implant and graft: Secondary | ICD-10-CM | POA: Diagnosis not present

## 2021-07-21 DIAGNOSIS — I2510 Atherosclerotic heart disease of native coronary artery without angina pectoris: Secondary | ICD-10-CM | POA: Diagnosis not present

## 2021-07-21 DIAGNOSIS — M47814 Spondylosis without myelopathy or radiculopathy, thoracic region: Secondary | ICD-10-CM | POA: Diagnosis not present

## 2021-07-21 DIAGNOSIS — Z20822 Contact with and (suspected) exposure to covid-19: Secondary | ICD-10-CM | POA: Diagnosis not present

## 2021-07-21 DIAGNOSIS — R109 Unspecified abdominal pain: Secondary | ICD-10-CM | POA: Diagnosis not present

## 2021-07-21 DIAGNOSIS — N3289 Other specified disorders of bladder: Secondary | ICD-10-CM | POA: Diagnosis not present

## 2021-07-21 DIAGNOSIS — I5022 Chronic systolic (congestive) heart failure: Secondary | ICD-10-CM | POA: Diagnosis not present

## 2021-07-21 DIAGNOSIS — Z7901 Long term (current) use of anticoagulants: Secondary | ICD-10-CM | POA: Diagnosis not present

## 2021-07-21 DIAGNOSIS — M47816 Spondylosis without myelopathy or radiculopathy, lumbar region: Secondary | ICD-10-CM | POA: Diagnosis not present

## 2021-07-21 DIAGNOSIS — J449 Chronic obstructive pulmonary disease, unspecified: Secondary | ICD-10-CM | POA: Diagnosis not present

## 2021-07-21 DIAGNOSIS — I088 Other rheumatic multiple valve diseases: Secondary | ICD-10-CM | POA: Diagnosis not present

## 2021-07-21 DIAGNOSIS — J984 Other disorders of lung: Secondary | ICD-10-CM | POA: Diagnosis not present

## 2021-07-21 DIAGNOSIS — J45901 Unspecified asthma with (acute) exacerbation: Secondary | ICD-10-CM | POA: Diagnosis not present

## 2021-07-21 DIAGNOSIS — R0902 Hypoxemia: Secondary | ICD-10-CM | POA: Diagnosis not present

## 2021-07-21 DIAGNOSIS — E039 Hypothyroidism, unspecified: Secondary | ICD-10-CM | POA: Diagnosis not present

## 2021-07-21 DIAGNOSIS — I959 Hypotension, unspecified: Secondary | ICD-10-CM | POA: Diagnosis not present

## 2021-07-21 DIAGNOSIS — I480 Paroxysmal atrial fibrillation: Secondary | ICD-10-CM | POA: Diagnosis not present

## 2021-07-21 DIAGNOSIS — R0602 Shortness of breath: Secondary | ICD-10-CM | POA: Diagnosis not present

## 2021-07-21 DIAGNOSIS — E785 Hyperlipidemia, unspecified: Secondary | ICD-10-CM | POA: Diagnosis not present

## 2021-07-21 DIAGNOSIS — Z950 Presence of cardiac pacemaker: Secondary | ICD-10-CM | POA: Diagnosis not present

## 2021-07-22 DIAGNOSIS — I083 Combined rheumatic disorders of mitral, aortic and tricuspid valves: Secondary | ICD-10-CM | POA: Diagnosis not present

## 2021-07-22 DIAGNOSIS — J984 Other disorders of lung: Secondary | ICD-10-CM | POA: Diagnosis not present

## 2021-07-22 DIAGNOSIS — J90 Pleural effusion, not elsewhere classified: Secondary | ICD-10-CM | POA: Diagnosis not present

## 2021-07-22 DIAGNOSIS — I480 Paroxysmal atrial fibrillation: Secondary | ICD-10-CM | POA: Diagnosis not present

## 2021-07-22 DIAGNOSIS — M47816 Spondylosis without myelopathy or radiculopathy, lumbar region: Secondary | ICD-10-CM | POA: Diagnosis not present

## 2021-07-22 DIAGNOSIS — R0902 Hypoxemia: Secondary | ICD-10-CM | POA: Diagnosis not present

## 2021-07-22 DIAGNOSIS — N3289 Other specified disorders of bladder: Secondary | ICD-10-CM | POA: Diagnosis not present

## 2021-07-22 DIAGNOSIS — I110 Hypertensive heart disease with heart failure: Secondary | ICD-10-CM | POA: Diagnosis not present

## 2021-07-22 DIAGNOSIS — Z950 Presence of cardiac pacemaker: Secondary | ICD-10-CM | POA: Diagnosis not present

## 2021-07-22 DIAGNOSIS — R0602 Shortness of breath: Secondary | ICD-10-CM | POA: Diagnosis not present

## 2021-07-22 DIAGNOSIS — M47814 Spondylosis without myelopathy or radiculopathy, thoracic region: Secondary | ICD-10-CM | POA: Diagnosis not present

## 2021-07-22 DIAGNOSIS — Z7901 Long term (current) use of anticoagulants: Secondary | ICD-10-CM | POA: Diagnosis not present

## 2021-07-22 DIAGNOSIS — I509 Heart failure, unspecified: Secondary | ICD-10-CM | POA: Diagnosis not present

## 2021-07-22 DIAGNOSIS — I350 Nonrheumatic aortic (valve) stenosis: Secondary | ICD-10-CM | POA: Diagnosis not present

## 2021-07-22 DIAGNOSIS — I2510 Atherosclerotic heart disease of native coronary artery without angina pectoris: Secondary | ICD-10-CM | POA: Diagnosis not present

## 2021-07-22 DIAGNOSIS — E039 Hypothyroidism, unspecified: Secondary | ICD-10-CM | POA: Diagnosis not present

## 2021-07-23 DIAGNOSIS — Z87891 Personal history of nicotine dependence: Secondary | ICD-10-CM | POA: Diagnosis not present

## 2021-07-23 DIAGNOSIS — I2510 Atherosclerotic heart disease of native coronary artery without angina pectoris: Secondary | ICD-10-CM | POA: Diagnosis not present

## 2021-07-23 DIAGNOSIS — Z954 Presence of other heart-valve replacement: Secondary | ICD-10-CM | POA: Diagnosis not present

## 2021-07-23 DIAGNOSIS — J449 Chronic obstructive pulmonary disease, unspecified: Secondary | ICD-10-CM | POA: Diagnosis not present

## 2021-07-23 DIAGNOSIS — I252 Old myocardial infarction: Secondary | ICD-10-CM | POA: Diagnosis not present

## 2021-07-23 DIAGNOSIS — I350 Nonrheumatic aortic (valve) stenosis: Secondary | ICD-10-CM | POA: Diagnosis not present

## 2021-07-23 DIAGNOSIS — K219 Gastro-esophageal reflux disease without esophagitis: Secondary | ICD-10-CM | POA: Diagnosis not present

## 2021-07-23 DIAGNOSIS — Z955 Presence of coronary angioplasty implant and graft: Secondary | ICD-10-CM | POA: Diagnosis not present

## 2021-07-23 DIAGNOSIS — I77819 Aortic ectasia, unspecified site: Secondary | ICD-10-CM | POA: Diagnosis not present

## 2021-07-23 DIAGNOSIS — Z950 Presence of cardiac pacemaker: Secondary | ICD-10-CM | POA: Diagnosis not present

## 2021-07-23 DIAGNOSIS — Z7901 Long term (current) use of anticoagulants: Secondary | ICD-10-CM | POA: Diagnosis not present

## 2021-07-23 DIAGNOSIS — I5022 Chronic systolic (congestive) heart failure: Secondary | ICD-10-CM | POA: Diagnosis not present

## 2021-07-23 DIAGNOSIS — R0902 Hypoxemia: Secondary | ICD-10-CM | POA: Diagnosis not present

## 2021-07-29 DIAGNOSIS — Z7901 Long term (current) use of anticoagulants: Secondary | ICD-10-CM | POA: Diagnosis not present

## 2021-07-29 DIAGNOSIS — Z952 Presence of prosthetic heart valve: Secondary | ICD-10-CM | POA: Diagnosis not present

## 2021-07-29 DIAGNOSIS — E782 Mixed hyperlipidemia: Secondary | ICD-10-CM | POA: Diagnosis not present

## 2021-07-29 DIAGNOSIS — I2510 Atherosclerotic heart disease of native coronary artery without angina pectoris: Secondary | ICD-10-CM | POA: Diagnosis not present

## 2021-07-29 DIAGNOSIS — I5022 Chronic systolic (congestive) heart failure: Secondary | ICD-10-CM | POA: Diagnosis not present

## 2021-07-29 DIAGNOSIS — Z45018 Encounter for adjustment and management of other part of cardiac pacemaker: Secondary | ICD-10-CM | POA: Diagnosis not present

## 2021-07-29 DIAGNOSIS — I350 Nonrheumatic aortic (valve) stenosis: Secondary | ICD-10-CM | POA: Diagnosis not present

## 2021-08-17 DIAGNOSIS — Z7901 Long term (current) use of anticoagulants: Secondary | ICD-10-CM | POA: Diagnosis not present

## 2021-08-17 DIAGNOSIS — Z952 Presence of prosthetic heart valve: Secondary | ICD-10-CM | POA: Diagnosis not present

## 2021-08-20 ENCOUNTER — Other Ambulatory Visit: Payer: Self-pay

## 2021-08-20 ENCOUNTER — Ambulatory Visit
Admission: RE | Admit: 2021-08-20 | Discharge: 2021-08-20 | Disposition: A | Discharge status: Home / Self Care | Payer: Self-pay | Source: Ambulatory Visit | Attending: Cardiovascular Disease | Admitting: Cardiovascular Disease

## 2021-08-20 DIAGNOSIS — I350 Nonrheumatic aortic (valve) stenosis: Secondary | ICD-10-CM

## 2021-08-22 ENCOUNTER — Other Ambulatory Visit: Payer: Self-pay

## 2021-08-22 ENCOUNTER — Encounter: Payer: Self-pay | Admitting: Cardiovascular Disease

## 2021-08-22 ENCOUNTER — Ambulatory Visit: Payer: Medicare Other | Admitting: Cardiovascular Disease

## 2021-08-22 VITALS — BP 128/52 | HR 68 | Ht 58.5 in | Wt 123.0 lb

## 2021-08-22 DIAGNOSIS — I7121 Aneurysm of the ascending aorta, without rupture: Secondary | ICD-10-CM | POA: Diagnosis not present

## 2021-08-22 DIAGNOSIS — I060 Rheumatic aortic stenosis: Secondary | ICD-10-CM

## 2021-08-22 DIAGNOSIS — I350 Nonrheumatic aortic (valve) stenosis: Secondary | ICD-10-CM | POA: Diagnosis not present

## 2021-08-22 DIAGNOSIS — Z952 Presence of prosthetic heart valve: Secondary | ICD-10-CM | POA: Diagnosis not present

## 2021-08-22 NOTE — Progress Notes (Addendum)
Cardiology Office Note:    Date:  08/24/2021   ID:  [name] [name], DOB 06/02/1946, MRN [id number]  PCP:  Ronita Hipps, MD   [facility] Providers Cardiologist:  None     Referring MD: Ronita Hipps, MD   Chief Complaint  Patient presents with   Shortness of Breath    History of Present Illness:    [name] [name] is a 75 y.o. female presents for evaluation of critical aortic stenosis, referred by Dr Quillian Quince.  She had rheumatic fever at age 9 and went on to develop rheumatic heart disease. She ultimately underwent mechanical mitral valve replacement approximately 20 years ago at [facility]. She has tolerated long-term warfarin therapy well without any recent bleeding problems. She underwent permanent pacemaker placement (dual-chamber) in 2009 by Dr Caryl Comes.  The patient is here with family members today.  She complains of shortness of breath and fatigue with low-level activity.  She denies orthopnea, PND, or leg swelling.  She describes progressive shortness of breath and generalized fatigue over the past year, now with limitation during low-level activities.  She can only walk short distances without feeling breathless.  The patient's recent echocardiogram from July 22, 2021 demonstrates severe global LV systolic dysfunction with LVEF 30 to 35%, severe calcification and restriction of the aortic valve leaflets, mild to moderate aortic valve insufficiency, and critical aortic stenosis with a mean transvalvular gradient of 53 mmHg, peak systolic velocity of 4.5 m/s, and calculated aortic valve area less than 0.5 cm.  She actually underwent TAVR evaluation at [facility], but did not follow through with completing her work-up there.  She had CT angiography studies done earlier this year.  She appears to have access for transfemoral TAVR.  The patient reports a remote bleeding problem on chronic oral anticoagulation but has not had problems in many years.  She seems to be tolerating  warfarin well.  Past Medical History:  Diagnosis Date   Anxiety    Aortic aneurysm [facility])    Atrial arrhythmia    With prior history of atrial flutter ablation   Bradycardia    s/p pacemaker implantation   CONGESTIVE HEART FAILURE 07/31/2010   Qualifier: Diagnosis of  By: Caryl Comes, MD, Remus Blake    GERD (gastroesophageal reflux disease)    Hypertension    Hypothyroidism    Long Q-T syndrome    Myocardial infarct ([place]) 11/20/2011   Stent placement for 100% blockage of LAD   PACEMAKER, 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   Qualifier: Diagnosis of  By: Caryl Comes, MD, Remus Blake    Rheumatic heart disease    With mitral valve replacement, tricuspid regurgitation moderate to severe, significant left atrial enlargment   Shortness of breath 07/31/2010   Qualifier: Diagnosis of  By: Caryl Comes, MD, Remus Blake    TRICUSPID REGURGITATION 12/23/2010   Qualifier: Diagnosis of  By: Caryl Comes, MD, Remus Blake    Unspecified essential hypertension 07/31/2010   Qualifier: Diagnosis of  By: Caryl Comes, MD, [facility], Mack Guise     Past Surgical History:  Procedure Laterality Date   APPENDECTOMY     CARDIAC ELECTROPHYSIOLOGY STUDY AND ABLATION     Stent and pacemaker placement   CHOLECYSTECTOMY     CORONARY ANGIOPLASTY WITH STENT PLACEMENT Left 09/13/2015   Dr. Gwenith Spitz at [facility], LAD stent   [place]  11/20/2007   Dual chamber pacemaker implantation with rapid ventricular pacing   SALPINGECTOMY Bilateral    TONSILLECTOMY  Current Medications: Current Meds  Medication Sig   albuterol (VENTOLIN HFA) 108 (90 Base) MCG/ACT inhaler Inhale 1 puff into the lungs every 6 (six) hours as needed for shortness of breath.   ALPRAZolam (XANAX) 1 MG tablet Take 1 mg by mouth in the morning and at bedtime.   atorvastatin (LIPITOR) 80 MG tablet Take 80 mg by mouth daily.   baclofen (LIORESAL) 20 MG tablet Take 1 tablet by mouth every 8 (eight) hours as needed  for muscle spasms.   candesartan (ATACAND) 8 MG tablet Take 8 mg by mouth daily.   enoxaparin (LOVENOX) 60 MG/0.6ML injection Inject 60 mg into the skin every 12 (twelve) hours.   furosemide (LASIX) 40 MG tablet Take 40 mg by mouth daily as needed for edema or fluid.   hydrochlorothiazide 25 MG tablet Take 25 mg by mouth daily.     levothyroxine (SYNTHROID) 75 MCG tablet Take by mouth.   nitroGLYCERIN (NITROSTAT) 0.4 MG SL tablet Place 0.4 mg under the tongue every 5 (five) minutes as needed for chest pain.   potassium chloride SA (K-DUR,KLOR-CON) 20 MEQ tablet Take 20 mEq by mouth daily.     warfarin (COUMADIN) 2 MG tablet Take 2 mg by mouth daily. Take daily in addition to the 5mg    warfarin (COUMADIN) 5 MG tablet Take 5 mg by mouth daily.   zolpidem (AMBIEN) 10 MG tablet Take 10 mg by mouth as needed for sleep.     Allergies:   Atenolol and Metoprolol succinate [metoprolol]   Social History   Socioeconomic History   Marital status: Single    Spouse name: Not on file   Number of children: Not on file   Years of education: Not on file   Highest education level: Not on file  Occupational History   Not on file  Tobacco Use   Smoking status: Never   Smokeless tobacco: Not on file  Substance and Sexual Activity   Alcohol use: Never   Drug use: Never   Sexual activity: Not on file  Other Topics Concern   Not on file  Social History Narrative   Not on file   Social Determinants of Health   Financial Resource Strain: Not on file  Food Insecurity: Not on file  Transportation Needs: Not on file  Physical Activity: Not on file  Stress: Not on file  Social Connections: Not on file     Family History: The patient's family history includes Heart attack in her paternal grandmother; Heart disease in her father; Leukemia in her mother; Stroke in her maternal grandmother and mother.  ROS:   Please see the history of present illness.    Positive for back pain, fatigue. All other  systems reviewed and are negative.  EKGs/Labs/Other Studies Reviewed:    The following studies were reviewed today: Echo: SUMMARY  The left ventricular size is normal.  There is moderate concentric left ventricular hypertrophy.  Left ventricular systolic function is moderately reduced.  LV ejection fraction = 35-40%.  Abnormal (paradoxical) septal motion consistent with RV pacemaker.  There are regional wall motion abnormalities as specified below.  There is severe hypokinesis and thinning of the anterior,  anteroseptal, inferoseptal territory from mid to apex.  The right ventricle is normal in size and function.  The left atrium is severely dilated.  Diffuse thickening of the aortic valve with restricted cusp opening.  There is critically severe aortic stenosis.  There is moderate aortic regurgitation.  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  There is moderate tricuspid regurgitation. Moderate pulmonary  hypertension.  The aortic Sinus(es) of Valsalva are borderline dilated.  The IVC is normal in size with an inspiratory collapse of greater then  50%, suggesting normal right atrial pressure.  There is no pericardial effusion.  Compared to prior study dated 12/30/20, AS is now critically severe.   -  FINDINGS:  LEFT VENTRICLE  The left ventricular size is normal. Upper septal hypertrophy (sigmoid  septum), normal variant. There is moderate concentric left ventricular  hypertrophy. Left ventricular systolic function is moderately reduced.  LV ejection fraction = 35-40%. LV Global L Strain =-8.3%. There is  severe [Grade III] diastolic dysfunction [restrictive filling  pattern], with elevated left atrial pressure. Mitral inflow  deceleration timeAbnormal<150 msec. Consistent with restrictive  physiology.. Abnormal (paradoxical) septal motion consistent with RV  pacemaker. There are regional wall motion abnormalities as specified  below. There is severe hypokinesis and thinning of  the anterior,  anteroseptal, inferoseptal territory from mid to apex.   -  RIGHT VENTRICLE  The right ventricle is normal in size and function.   LEFT ATRIUM  The left atrium is severely dilated. LAVI 111.0 ml/m2.   RIGHT ATRIUM  Right atrial size is normal.  -  AORTIC VALVE  The aortic valve is trileaflet. Diffuse thickening of the aortic valve  with restricted cusp opening. There is critically severe aortic  stenosis. There is moderate aortic regurgitation. The calculated  aortic valve area using the continuity equation is 0.4 cm2. Aortic  valve mean pressure gradient is 53 mmHg. The peak aortic valve  velocity 449 cm/s.  -  MITRAL VALVE  There is no mitral regurgitation noted. The mean gradient across the  mitral valve is 3.5 mmHg. The heart rate for the mean mitral valve  gradient is 61 BPM. 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  -  TRICUSPID VALVE  Structurally normal tricuspid valve. There is moderate tricuspid  regurgitation. Estimated right ventricular systolic pressure is 50  mmHg. Moderate pulmonary hypertension.  -  PULMONIC VALVE  Structurally normal pulmonic valve. Trace pulmonic valvular  regurgitation.  -  ARTERIES  The aortic Sinus(es) of Valsalva are borderline dilated. The ascending  aorta is normal size. Mild pulmonary artery dilation. Measures 3.2 cm.   -  VENOUS  Pulmonary venous flow pattern not well visualized. The IVC is normal  in size with an inspiratory collapse of greater then 50%, suggesting  normal right atrial pressure.  -  EFFUSION  There is no pericardial effusion.  -  -   MMode/2D Measurements & Calculations  IVSd: 1.7 cm       LA dim: 6.0 cm    ESV(MOD-sp4):  Ao sinus diam:  LVIDd: 5.2 cm      EDV(MOD-sp4):     75.2 ml        3.8 cm  LVPWd: 1.3 cm      121.0 ml          EDV(MOD-sp2):  LVIDs: 3.7 cm                        169.0 ml                                       ESV(MOD-sp2):  113.0 ml           _______________________________________________________________________  asc Aorta Diam:    LVOT diam: 2.0 cm SV(MOD-sp4):   EF A4C: 37.9 %  3.6 cm                               45.8 ml                                       SI(MOD-sp4):                                       29.8 ml/m2          _______________________________________________________________________  LA area A2:        LA area A4:       LA ESV (BP):   LA ESV Index  40.9 cm2           41.2 cm2          171.0 ml       (A2C): 109.1 ml/m2           _______________________________________________________________________  LA ESV Index       LA ESV Index (BP):LA vol:        LA vol index:  (A4C): 111.0 ml/m2 111.0 ml/m2       183.4 ml       119.4 ml/m2           _______________________________________________________________________  SV A4C: 45.8 ml   Doppler Measurements & Calculations  MV E max vel:       MV V2 max:       MV P1/2t max IN:[id number]):  138.0 cm/sec        166.8 cm/sec     162.4 cm/sec     45.3 ml  MV A max vel:       MV max PG:       MV P1/2t:        Ao V2 max:  59.2 cm/sec         11.1 mmHg        58.5 msec        491.2 cm/sec  MV E/A: 2.3         MV V2 mean:      MV dec time:     Ao max PG:  Med Peak E' Vel:    77.0 cm/sec      0.12 sec         96.5 mmHg  4.8 cm/sec          MV mean PG:                       Ao V2 mean:  Lat Peak E' Vel:    3.0 mmHg                          352.9 cm/sec  12.5 cm/sec         MV V2 VTI:                        Ao mean PG:  E/Lat E`: 11.0      37.9 cm  57.4 mmHg  E/Med E`: 28.8      MVA(P1/2t):                       Ao V2 VTI:                      3.8 cm2                           144.2 cm                      MVA(VTI): 1.2 cm2                 AVA (VTI):                                                        0.31 cm2          _______________________________________________________________________  AI max vel:         LV V1 VTI:       TR max  vel:      RAP systole:  401.4 cm/sec        14.7 cm          342.1 cm/sec     3.0 mmHg  AI dec slope:                        TR max PG:  335.9 cm/sec2                        46.8 mmHg  AI P1/2t:                            RVSP(TR):  350.0 msec                           49.8 mmHg           _______________________________________________________________________  AS Dimensionless    AVAi(VTI)        SV index(LVOT):  Index (VTI): 0.10   cm^2/m^2:                                       29.5 ml/m2                      0.20 cm2   CTA Chest/Abd/Pelvis ([facility] study) - 03/09/21:  Great vessels: The great vessels are moderately tortuous, however normal in caliber and patent.   Thoracic aorta: Ectasia of the ascending thoracic aorta measuring up to 4 x 3.7 cm. The ascending thoracic aorta is normal in caliber. Atherosclerotic calcifications of the aortic valve and thoracic aorta.   Pulmonary arteries/veins: Main pulmonary artery is enlarged measuring up to 4 cm. Limited evaluation of the pulmonary veins due to contrast bolus.   Heart: Left atrial and left ventricular enlargement. Aortic valve and mitral valve calcifications. Left chest wall AICD with leads in the right atrium and right ventricle. Coronary atherosclerosis. No pericardial effusion.   Abdominal aorta: Atherosclerotic calcifications of the abdominal aorta which  is normal in caliber. Celiac, SMA and IMA are patent with mild ostial narrowing due to atherosclerosis. Single bilateral renal arteries with moderate left and mild right ostial narrowing due to atherosclerosis.   Right common iliac artery: 7.27mm  Right external iliac artery: 6.23mm  Right common femoral artery: 8.4 mm.  Right profunda femoral and SFA: patent  Left common iliac artery: 9.29mm  Left external iliac artery: 7.48mm  Left common femoral artery: 8.1 mm  Left profunda femoral/SFA: patent    Cardiac CTA (03/09/21 - [facility]): Impressions:  - Calcified trileaflet aortic valve  with restricted leaflet mobility.  Pre-TAVR measurements as below  - Well seated mechanical mitral valve with normal leaflet mobility  - Severely reduced left ventricular systolic function with aneurysmal  anteroapical segments  - Coronary artery disease without evidence of significant stenosis  including mid-LAD stent that appears patent  - Dilated ascending aorta and main pulmonary artery    Indication: 75 year old woman with history of rheumatic mitral  regurgitation s/p mechanical MVR and now with severe aortic stenosis;  pre-TAVR evaluation  Contrast Used (ml): 75   Study Quality: good   Findings:  AORTIC MEASUREMENTS:   Measurement phase: 100 msec  Aortic annular major and orthogonal minor axis (mm) - 27.7 x 19.7 (mean  23.3)  Aortic annular perimeter (mm) - 76  Aortic annular area (square cm) - 4.3  Aortic annular calcification: Mild calcification at the right and left  coronary cusps  Sub-annular calcification: No calcification     Right coronary sinus of valsalva (mm) - 30  Left coronary sinus of valsalva (mm) - 33.2  Non-coronary sinus of valsalva (mm) - 35.1   Mean sinotubular junction diameter (mm): 31.7  Sinotubular junction height from annular plane (mm): 21   Distance from base of right coronary artery to annular plane (mm) - 14.4  Distance from base of left coronary artery to annular plane (mm) - 11.7   Optimal fluoroscopic projection angle - LAO 7 CAU 4   Aortic Valve:    Trileaflet aortic valve with Moderate calcification with restricted  leaflet mobility.    Mitral Valve:  A 27 mm [employer] Jude bileaflet mechanical valve is well seated in the mitral  position. There is normal excursion of the mechanical valve leaflets.   Coronary Arteries:  The coronary arteries arise in a normal position. There is co-dominance.   Left main: The left main coronary artery is a large size vessel that  bifurcates into the LAD and LCx. There is no evidence of plaque or   stenosis.   LAD: The left anterior descending artery is a normal size vessel that  gives off 3 diagonal branches. There is eccentric calcified plaque in the  proximal and mid segments without evidence of significant stenosis. The  mid LAD stent appears patent.   LCX: The left circumflex artery is a normal size vessel that gives off 2  obtuse marginal branches and a posterior lateral branch. There is  eccentric calcified plaque in the proximal and mid segments including  proximal OM1. There is no evidence of significant stenosis.   RCA: The right coronary artery is a dominant vessel that gives off a PDA  and PL branches. There is diffuse eccentric calcified plaque without  evidence of significant stenosis.    Cardiac Chambers:  The left ventricle is qualitatively normal in size with severely reduced  systolic function. The mid to apical septal, apical, and apical inferior  segments are thin, dyskinetic, and aneurysmal.  The right ventricle is qualitatively normal in size with probably normal  systolic function.  The left and right atria are qualitatively dilated in size.  Device leads are visualized terminating in the right atrium and right  ventricle.   Pulmonary veins:  2 left and 2 right pulmonary veins return normally to the left atrium.   Great Vessels:  The proximal ascending aorta is mildly dilated measuring 4.2 cm in maximal  dimension. The main pulmonary artery is dilated.    EKG:  EKG is ordered today.  The ekg ordered today demonstrates AV sequential pacing  Recent Labs: 08/22/2021: BUN 18; Creatinine, Ser 1.25; Potassium 3.8; Sodium 143  Recent Lipid Panel    Component Value Date/Time   CHOL  02/02/2008 0435    175        ATP III CLASSIFICATION:  <200     mg/dL   Desirable  200-239  mg/dL   Borderline High  >=240    mg/dL   High   TRIG 123 02/02/2008 0435   HDL 33 (L) 02/02/2008 0435   CHOLHDL 5.3 02/02/2008 0435   VLDL 25 02/02/2008 0435   LDLCALC (H)  02/02/2008 0435    117        Total Cholesterol/HDL:CHD Risk Coronary Heart Disease Risk Table                     Men   Women  1/2 Average Risk   3.4   3.3     Risk Assessment/Calculations:    CHA2DS2-VASc Score = 5   This indicates a 7.2% annual risk of stroke. The patient's score is based upon: CHF History: 1 HTN History: 1 Diabetes History: 0 Stroke History: 0 Vascular Disease History: 1 Age Score: 1 Gender Score: 1         Physical Exam:    VS:  BP (!) 128/52   Pulse 68   Ht 4' 10.5" (1.486 m)   Wt 123 lb (55.8 kg)   SpO2 90%   BMI 25.27 kg/m     Wt Readings from Last 3 Encounters:  08/22/21 123 lb (55.8 kg)  03/14/21 119 lb (54 kg)  12/23/10 149 lb 12 oz (67.9 kg)     GEN:  Well nourished, well developed in no acute distress HEENT: Normal NECK: No JVD; No carotid bruits LYMPHATICS: No lymphadenopathy CARDIAC: RRR, 3/6 harsh systolic murmur at the RUSB, 2/6 diastolic decrescendo murmur RESPIRATORY:  Clear to auscultation without rales, wheezing or rhonchi  ABDOMEN: Soft, non-tender, non-distended MUSCULOSKELETAL:  No edema; No deformity  SKIN: Warm and dry NEUROLOGIC:  Alert and oriented x 3 PSYCHIATRIC:  Normal affect   ________________________  STS score Procedure: Isolated AVR Risk of Mortality: 4.169% Renal Failure: 2.054% Permanent Stroke: 0.725% Prolonged Ventilation: 13.176% DSW Infection: 0.143% Reoperation: 4.706% Morbidity or Mortality: 17.897% Short Length of Stay: 14.359% Long Length of Stay: 9.243%  ASSESSMENT:    1. Rheumatic aortic stenosis   2. Aneurysm of ascending aorta without rupture   3. H/O mitral valve replacement with mechanical valve    PLAN:    In order of problems listed above:  The patient has severe, stage D1 aortic stenosis.  I personally reviewed her echo images with findings outlined above in the HPI.  She has very severe aortic stenosis with New York Heart Association functional class III  symptoms of exertional dyspnea.  Outside CTA studies suggest that she has suitable access for transfemoral TAVR.  The natural history of aortic  stenosis is reviewed with the patient and her family members at length today.  TAVR would be a reasonable treatment option in this patient with previous cardiac surgery.  Further work-up will include updated CTA studies with a gated cardiac scan as well as a CTA of the chest, abdomen, and pelvis.  Cardiac catheterization will need to be considered.  However, in this patient with mechanical mitral prosthesis, it might be reasonable to defer her catheterization until the TAVR procedure is done.  This way she would only need to interrupt anticoagulation once.  Of note, the patient is not having any anginal chest pain.  All of this is reviewed in detail with the patient and her family members today.  I demonstrated a procedural animation of a transfemoral TAVR and reviewed the procedural steps, potential complications, and expected recovery with them.  We also discussed the findings of ascending aortic aneurysm which appears to be less than 4.5 cm in maximal diameter and will not require any surgical intervention at this time. As above, no indication for aortic surgery at this time. Continued surveillance is appropriate. Continue warfarin. Will need bridging with heparin at the time of TAVR (mechanical mitral valve, hx rheumatic disease, atrial fibrillation).    Medication Adjustments/Labs and Tests Ordered: Current medicines are reviewed at length with the patient today.  Concerns regarding medicines are outlined above.  Orders Placed This Encounter  Procedures   Basic metabolic panel   EKG 12-Lead    No orders of the defined types were placed in this encounter.   Patient Instructions  Medication Instructions:  Your physician recommends that you continue on your current medications as directed. Please refer to the Current Medication list given to you  today.  *If you need a refill on your cardiac medications before your next appointment, please call your pharmacy*   Lab Work: BMET today   If you have labs (blood work) drawn today and your tests are completely normal, you will receive your results only by: MyChart Message (if you have MyChart) OR A paper copy in the mail If you have any lab test that is abnormal or we need to change your treatment, we will call you to review the results.   Testing/Procedures: None   Follow-Up:  The structural team will in contact with next steps :1}    Other Instructions     Signed, Tonny Bollman, MD  08/24/2021 5:07 PM    [facility]

## 2021-08-22 NOTE — Patient Instructions (Signed)
Medication Instructions:  Your physician recommends that you continue on your current medications as directed. Please refer to the Current Medication list given to you today.  *If you need a refill on your cardiac medications before your next appointment, please call your pharmacy*   Lab Work: BMET today   If you have labs (blood work) drawn today and your tests are completely normal, you will receive your results only by: MyChart Message (if you have MyChart) OR A paper copy in the mail If you have any lab test that is abnormal or we need to change your treatment, we will call you to review the results.   Testing/Procedures: None   Follow-Up:  The structural team will in contact with next steps :1}    Other Instructions

## 2021-08-23 LAB — BASIC METABOLIC PANEL
BUN/Creatinine Ratio: 14 (ref 12–28)
BUN: 18 mg/dL (ref 8–27)
CO2: 27 mmol/L (ref 20–29)
Calcium: 9.1 mg/dL (ref 8.7–10.3)
Chloride: 100 mmol/L (ref 96–106)
Creatinine, Ser: 1.25 mg/dL — ABNORMAL HIGH (ref 0.57–1.00)
Glucose: 83 mg/dL (ref 70–99)
Potassium: 3.8 mmol/L (ref 3.5–5.2)
Sodium: 143 mmol/L (ref 134–144)
eGFR: 45 mL/min/{1.73_m2} — ABNORMAL LOW (ref >59)

## 2021-08-24 ENCOUNTER — Encounter: Payer: Self-pay | Admitting: Cardiovascular Disease

## 2021-08-26 ENCOUNTER — Emergency Department (HOSPITAL_COMMUNITY): Payer: Medicare Other

## 2021-08-26 ENCOUNTER — Inpatient Hospital Stay (HOSPITAL_COMMUNITY)
Admission: EM | Admit: 2021-08-26 | Discharge: 2021-08-30 | DRG: 193 | Disposition: A | Discharge status: Home / Self Care | Payer: Medicare Other | Attending: Student | Admitting: Student

## 2021-08-26 ENCOUNTER — Encounter (HOSPITAL_COMMUNITY): Payer: Self-pay | Admitting: Family Medicine

## 2021-08-26 ENCOUNTER — Other Ambulatory Visit: Payer: Self-pay

## 2021-08-26 DIAGNOSIS — Z950 Presence of cardiac pacemaker: Secondary | ICD-10-CM | POA: Diagnosis present

## 2021-08-26 DIAGNOSIS — R7989 Other specified abnormal findings of blood chemistry: Secondary | ICD-10-CM | POA: Diagnosis not present

## 2021-08-26 DIAGNOSIS — M549 Dorsalgia, unspecified: Secondary | ICD-10-CM | POA: Diagnosis present

## 2021-08-26 DIAGNOSIS — I499 Cardiac arrhythmia, unspecified: Secondary | ICD-10-CM | POA: Diagnosis not present

## 2021-08-26 DIAGNOSIS — R791 Abnormal coagulation profile: Secondary | ICD-10-CM | POA: Diagnosis not present

## 2021-08-26 DIAGNOSIS — I252 Old myocardial infarction: Secondary | ICD-10-CM

## 2021-08-26 DIAGNOSIS — I10 Essential (primary) hypertension: Secondary | ICD-10-CM | POA: Diagnosis present

## 2021-08-26 DIAGNOSIS — I719 Aortic aneurysm of unspecified site, without rupture: Secondary | ICD-10-CM | POA: Diagnosis present

## 2021-08-26 DIAGNOSIS — J09X2 Influenza due to identified novel influenza A virus with other respiratory manifestations: Secondary | ICD-10-CM | POA: Diagnosis not present

## 2021-08-26 DIAGNOSIS — I498 Other specified cardiac arrhythmias: Secondary | ICD-10-CM | POA: Diagnosis present

## 2021-08-26 DIAGNOSIS — N179 Acute kidney failure, unspecified: Secondary | ICD-10-CM | POA: Diagnosis not present

## 2021-08-26 DIAGNOSIS — J101 Influenza due to other identified influenza virus with other respiratory manifestations: Secondary | ICD-10-CM

## 2021-08-26 DIAGNOSIS — Z806 Family history of leukemia: Secondary | ICD-10-CM

## 2021-08-26 DIAGNOSIS — R0902 Hypoxemia: Secondary | ICD-10-CM

## 2021-08-26 DIAGNOSIS — F419 Anxiety disorder, unspecified: Secondary | ICD-10-CM | POA: Diagnosis present

## 2021-08-26 DIAGNOSIS — I071 Rheumatic tricuspid insufficiency: Secondary | ICD-10-CM | POA: Diagnosis not present

## 2021-08-26 DIAGNOSIS — I082 Rheumatic disorders of both aortic and tricuspid valves: Secondary | ICD-10-CM | POA: Diagnosis not present

## 2021-08-26 DIAGNOSIS — Z955 Presence of coronary angioplasty implant and graft: Secondary | ICD-10-CM

## 2021-08-26 DIAGNOSIS — K219 Gastro-esophageal reflux disease without esophagitis: Secondary | ICD-10-CM | POA: Diagnosis present

## 2021-08-26 DIAGNOSIS — Z8249 Family history of ischemic heart disease and other diseases of the circulatory system: Secondary | ICD-10-CM

## 2021-08-26 DIAGNOSIS — I472 Ventricular tachycardia, unspecified: Secondary | ICD-10-CM | POA: Diagnosis not present

## 2021-08-26 DIAGNOSIS — Z888 Allergy status to other drugs, medicaments and biological substances status: Secondary | ICD-10-CM

## 2021-08-26 DIAGNOSIS — F22 Delusional disorders: Secondary | ICD-10-CM | POA: Diagnosis not present

## 2021-08-26 DIAGNOSIS — I495 Sick sinus syndrome: Secondary | ICD-10-CM | POA: Diagnosis present

## 2021-08-26 DIAGNOSIS — I4510 Unspecified right bundle-branch block: Secondary | ICD-10-CM | POA: Diagnosis present

## 2021-08-26 DIAGNOSIS — E876 Hypokalemia: Secondary | ICD-10-CM | POA: Diagnosis present

## 2021-08-26 DIAGNOSIS — R0602 Shortness of breath: Secondary | ICD-10-CM | POA: Diagnosis not present

## 2021-08-26 DIAGNOSIS — I4892 Unspecified atrial flutter: Secondary | ICD-10-CM | POA: Diagnosis not present

## 2021-08-26 DIAGNOSIS — J9601 Acute respiratory failure with hypoxia: Secondary | ICD-10-CM | POA: Diagnosis present

## 2021-08-26 DIAGNOSIS — Z7901 Long term (current) use of anticoagulants: Secondary | ICD-10-CM

## 2021-08-26 DIAGNOSIS — E440 Moderate protein-calorie malnutrition: Secondary | ICD-10-CM | POA: Diagnosis not present

## 2021-08-26 DIAGNOSIS — Z79899 Other long term (current) drug therapy: Secondary | ICD-10-CM

## 2021-08-26 DIAGNOSIS — N1831 Chronic kidney disease, stage 3a: Secondary | ICD-10-CM | POA: Diagnosis present

## 2021-08-26 DIAGNOSIS — I5023 Acute on chronic systolic (congestive) heart failure: Secondary | ICD-10-CM | POA: Diagnosis not present

## 2021-08-26 DIAGNOSIS — I517 Cardiomegaly: Secondary | ICD-10-CM | POA: Diagnosis not present

## 2021-08-26 DIAGNOSIS — E871 Hypo-osmolality and hyponatremia: Secondary | ICD-10-CM | POA: Diagnosis present

## 2021-08-26 DIAGNOSIS — E039 Hypothyroidism, unspecified: Secondary | ICD-10-CM | POA: Diagnosis not present

## 2021-08-26 DIAGNOSIS — Z20822 Contact with and (suspected) exposure to covid-19: Secondary | ICD-10-CM | POA: Diagnosis not present

## 2021-08-26 DIAGNOSIS — I959 Hypotension, unspecified: Secondary | ICD-10-CM | POA: Diagnosis not present

## 2021-08-26 DIAGNOSIS — I130 Hypertensive heart and chronic kidney disease with heart failure and stage 1 through stage 4 chronic kidney disease, or unspecified chronic kidney disease: Secondary | ICD-10-CM | POA: Diagnosis present

## 2021-08-26 DIAGNOSIS — I2721 Secondary pulmonary arterial hypertension: Secondary | ICD-10-CM | POA: Diagnosis not present

## 2021-08-26 DIAGNOSIS — N183 Chronic kidney disease, stage 3 unspecified: Secondary | ICD-10-CM

## 2021-08-26 DIAGNOSIS — D61818 Other pancytopenia: Secondary | ICD-10-CM | POA: Diagnosis not present

## 2021-08-26 DIAGNOSIS — Z743 Need for continuous supervision: Secondary | ICD-10-CM | POA: Diagnosis not present

## 2021-08-26 DIAGNOSIS — I480 Paroxysmal atrial fibrillation: Secondary | ICD-10-CM | POA: Diagnosis present

## 2021-08-26 DIAGNOSIS — Z952 Presence of prosthetic heart valve: Secondary | ICD-10-CM

## 2021-08-26 DIAGNOSIS — R0789 Other chest pain: Secondary | ICD-10-CM | POA: Diagnosis not present

## 2021-08-26 DIAGNOSIS — I248 Other forms of acute ischemic heart disease: Secondary | ICD-10-CM | POA: Diagnosis not present

## 2021-08-26 DIAGNOSIS — I350 Nonrheumatic aortic (valve) stenosis: Secondary | ICD-10-CM

## 2021-08-26 DIAGNOSIS — I2510 Atherosclerotic heart disease of native coronary artery without angina pectoris: Secondary | ICD-10-CM | POA: Diagnosis present

## 2021-08-26 DIAGNOSIS — I471 Supraventricular tachycardia: Secondary | ICD-10-CM | POA: Diagnosis present

## 2021-08-26 DIAGNOSIS — I509 Heart failure, unspecified: Secondary | ICD-10-CM | POA: Diagnosis not present

## 2021-08-26 DIAGNOSIS — Z5320 Procedure and treatment not carried out because of patient's decision for unspecified reasons: Secondary | ICD-10-CM | POA: Diagnosis not present

## 2021-08-26 DIAGNOSIS — R6889 Other general symptoms and signs: Secondary | ICD-10-CM | POA: Diagnosis not present

## 2021-08-26 DIAGNOSIS — Z9189 Other specified personal risk factors, not elsewhere classified: Secondary | ICD-10-CM | POA: Diagnosis not present

## 2021-08-26 DIAGNOSIS — G8929 Other chronic pain: Secondary | ICD-10-CM | POA: Diagnosis present

## 2021-08-26 DIAGNOSIS — I351 Nonrheumatic aortic (valve) insufficiency: Secondary | ICD-10-CM | POA: Diagnosis not present

## 2021-08-26 DIAGNOSIS — I110 Hypertensive heart disease with heart failure: Secondary | ICD-10-CM | POA: Diagnosis not present

## 2021-08-26 DIAGNOSIS — Z6824 Body mass index (BMI) 24.0-24.9, adult: Secondary | ICD-10-CM

## 2021-08-26 DIAGNOSIS — Z79890 Hormone replacement therapy (postmenopausal): Secondary | ICD-10-CM

## 2021-08-26 DIAGNOSIS — R079 Chest pain, unspecified: Secondary | ICD-10-CM | POA: Diagnosis not present

## 2021-08-26 DIAGNOSIS — Z823 Family history of stroke: Secondary | ICD-10-CM

## 2021-08-26 HISTORY — DX: Chronic kidney disease, stage 3 unspecified: N18.30

## 2021-08-26 HISTORY — DX: Nonrheumatic aortic (valve) stenosis: I35.0

## 2021-08-26 HISTORY — DX: Hypoxemia: R09.02

## 2021-08-26 HISTORY — DX: Acute on chronic systolic (congestive) heart failure: I50.23

## 2021-08-26 HISTORY — DX: Influenza due to other identified influenza virus with other respiratory manifestations: J10.1

## 2021-08-26 HISTORY — DX: Long term (current) use of anticoagulants: Z79.01

## 2021-08-26 LAB — PROTIME-INR
INR: 3.8 — ABNORMAL HIGH (ref 0.8–1.2)
Prothrombin Time: 37.4 seconds — ABNORMAL HIGH (ref 11.4–15.2)

## 2021-08-26 LAB — BASIC METABOLIC PANEL
Anion gap: 13 (ref 5–15)
BUN: 34 mg/dL — ABNORMAL HIGH (ref 8–23)
CO2: 24 mmol/L (ref 22–32)
Calcium: 8.6 mg/dL — ABNORMAL LOW (ref 8.9–10.3)
Chloride: 93 mmol/L — ABNORMAL LOW (ref 98–111)
Creatinine, Ser: 1.4 mg/dL — ABNORMAL HIGH (ref 0.44–1.00)
GFR, Estimated: 39 mL/min — ABNORMAL LOW (ref >60)
Glucose, Bld: 124 mg/dL — ABNORMAL HIGH (ref 70–99)
Potassium: 3.2 mmol/L — ABNORMAL LOW (ref 3.5–5.1)
Sodium: 130 mmol/L — ABNORMAL LOW (ref 135–145)

## 2021-08-26 LAB — TROPONIN I (HIGH SENSITIVITY): Troponin I (High Sensitivity): 41 ng/L — ABNORMAL HIGH (ref <18)

## 2021-08-26 LAB — CBC WITH DIFFERENTIAL/PLATELET
Abs Immature Granulocytes: 0.01 10*3/uL (ref 0.00–0.07)
Basophils Absolute: 0 10*3/uL (ref 0.0–0.1)
Basophils Relative: 0 %
Eosinophils Absolute: 0 10*3/uL (ref 0.0–0.5)
Eosinophils Relative: 0 %
HCT: 39.8 % (ref 36.0–46.0)
Hemoglobin: 12.3 g/dL (ref 12.0–15.0)
Immature Granulocytes: 0 %
Lymphocytes Relative: 12 %
Lymphs Abs: 0.4 10*3/uL — ABNORMAL LOW (ref 0.7–4.0)
MCH: 27.8 pg (ref 26.0–34.0)
MCHC: 30.9 g/dL (ref 30.0–36.0)
MCV: 90 fL (ref 80.0–100.0)
Monocytes Absolute: 0.6 10*3/uL (ref 0.1–1.0)
Monocytes Relative: 16 %
Neutro Abs: 2.5 10*3/uL (ref 1.7–7.7)
Neutrophils Relative %: 72 %
Platelets: 111 10*3/uL — ABNORMAL LOW (ref 150–400)
RBC: 4.42 MIL/uL (ref 3.87–5.11)
RDW: 15.8 % — ABNORMAL HIGH (ref 11.5–15.5)
WBC: 3.4 10*3/uL — ABNORMAL LOW (ref 4.0–10.5)
nRBC: 0 % (ref 0.0–0.2)

## 2021-08-26 LAB — RESP PANEL BY RT-PCR (FLU A&B, COVID) ARPGX2
Influenza A by PCR: POSITIVE — AB
Influenza B by PCR: NEGATIVE
SARS Coronavirus 2 by RT PCR: NEGATIVE

## 2021-08-26 LAB — BRAIN NATRIURETIC PEPTIDE: B Natriuretic Peptide: 1295.2 pg/mL — ABNORMAL HIGH (ref 0.0–100.0)

## 2021-08-26 NOTE — ED Triage Notes (Signed)
Pt BIB EMS due to SOB. Pt got diagnosed with the flu today. Pt is also having chest pain. Pt states her pacemaker fired multiple times. Pt received albuterol via EMS. Pt was statting at 83% RA and is on 4L statting at 93%

## 2021-08-26 NOTE — Progress Notes (Signed)
ANTICOAGULATION CONSULT NOTE - Initial Consult  Pharmacy Consult for Warfarin  Indication: St Jude Mechanical MVR, history of afib/flutter  Allergies  Allergen Reactions   Atenolol     REACTION: low pulse   Metoprolol Succinate [Metoprolol]     Patient Measurements: Height: 4\' 10"  (147.3 cm) Weight: 54.4 kg (120 lb) IBW/kg (Calculated) : 40.9  Vital Signs: Temp: 99 F (37.2 C) (11/08 2139) Temp Source: Oral (11/08 2139) BP: 112/58 (11/08 2345) Pulse Rate: 78 (11/08 2345)  Labs: Recent Labs    08/26/21 2149  HGB 12.3  HCT 39.8  PLT 111*  LABPROT 37.4*  INR 3.8*  CREATININE 1.40*  TROPONINIHS 41*    Estimated Creatinine Clearance: 25.8 mL/min (A) (by C-G formula based on SCr of 1.4 mg/dL (H)).   Medical History: Past Medical History:  Diagnosis Date   Anxiety    Aortic aneurysm [facility])    Atrial arrhythmia    With prior history of atrial flutter ablation   Bradycardia    s/p pacemaker implantation   CONGESTIVE HEART FAILURE 07/31/2010   Qualifier: Diagnosis of  By: 08/02/2010, MD, Graciela Husbands    GERD (gastroesophageal reflux disease)    Hypertension    Hypothyroidism    Long Q-T syndrome    Myocardial infarct (HCC) 11/20/2011   Stent placement for 100% blockage of LAD   PACEMAKER, 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   Qualifier: Diagnosis of  By: 08/02/2010, MD, Graciela Husbands    Rheumatic heart disease    With mitral valve replacement, tricuspid regurgitation moderate to severe, significant left atrial enlargment   Shortness of breath 07/31/2010   Qualifier: Diagnosis of  By: 08/02/2010, MD, Graciela Husbands    TRICUSPID REGURGITATION 12/23/2010   Qualifier: Diagnosis of  By: 02/22/2011, MD, Graciela Husbands    Unspecified essential hypertension 07/31/2010   Qualifier: Diagnosis of  By: 08/02/2010, MD, Graciela Husbands      Assessment: 75 y/o F with significant cardiac history presents to the ED with shortness of breath. She is under consideration for TAVR. On  warfarin PTA for mechanical MVR and history of afib/flutter. INR is above goal at 3.8. Hgb good. Plts 111.   Goal of Therapy:  INR 2.5-3.5  Monitor platelets by anticoagulation protocol: Yes   Plan:  No warfarin tonight Daily PT/INR Resume warfarin as INR allows  67, PharmD, BCPS Clinical Pharmacist Phone: [phone]

## 2021-08-26 NOTE — H&P (Addendum)
History and Physical    [name] [name] LFY:[id number] DOB: 1946/09/10 DOA: 08/26/2021  PCP: Marylen Ponto, MD   Patient coming from: Home  Chief Complaint: SOB, cough, chest pressure.  HPI: [name] [name] is a 75 y.o. female with medical history significant for CKD3 aortic stenosis, a-fib on coumadin, HTN, HFrEF who presents with chest pressure and shortness of breath.  She reports that for the last 4 days she has had cough with chest congestion and increasing shortness of breath.  She also reports having fever at home.  Went to her PCP today and was diagnosed with influenza and started on Tamiflu of which she took 1 dose.  After going home she had worsening shortness of breath and chest tightness in the substernal region so 911 was called.  She was found to be hypoxic with oxygen saturation in the mid 80s and was started on oxygen by nasal cannula.  She reports she saw her cardiac surgeon last week for aortic valve stenosis evaluation and discussion of possible surgical intervention in the near future.  She has not been around any known sick contacts.  She has not taken any over-the-counter cough or cold medications.  She did take Tylenol for fever the last few days intermittently. Denies tobacco alcohol or illicit drug use  ED Course: Patient has had tachypnea with a T-max of 99 degrees.  Chest x-ray shows cardiomegaly with vascular congestion.  BNP is elevated at 1095.2 troponin elevated at 41.  Sodium 130 potassium 3.2 chloride 93 bicarb 24 glucose 124 creatinine 1.40 BUN 34 calcium 8.6 CBC unremarkable.  INR 3.8.  Cardiology was consulted to assist with medication management with severe aortic stenosis.  Hospitalist service asked to admit for further management  Review of Systems:  General: Reports subjective fever, chills. Denies weight loss, night sweats.  Denies dizziness. Reports decreased appetite HENT: Denies head trauma, headache, denies change in hearing, tinnitus.  Denies nasa  bleeding.  Denies sore throat.  Denies difficulty swallowing Eyes: Denies blurry vision, pain in eye, drainage.  Denies discoloration of eyes. Neck: Denies pain.  Denies swelling.  Denies pain with movement. Cardiovascular: Reports chest pressure and palpitations. Has chronic edema.  Denies orthopnea Respiratory: reports shortness of breath, cough.  Denies wheezing.  Denies sputum production Gastrointestinal: Denies abdominal pain, swelling.  Denies nausea, vomiting, diarrhea.  Denies melena.  Denies hematemesis. Musculoskeletal: Denies limitation of movement.  Denies deformity or swelling.  Denies pain.  Denies arthralgias or myalgias. Genitourinary: Denies pelvic pain.  Denies urinary frequency or hesitancy.  Denies dysuria.  Skin: Denies rash.  Denies petechiae, purpura, ecchymosis. Neurological: Denies syncope.  Denies seizure activity.  Denies slurred speech, drooping face.  Denies visual change. Psychiatric: Denies depression, anxiety. Denies hallucinations.  Past Medical History:  Diagnosis Date   Anxiety    Aortic aneurysm [facility])    Atrial arrhythmia    With prior history of atrial flutter ablation   Bradycardia    s/p pacemaker implantation   CONGESTIVE HEART FAILURE 07/31/2010   Qualifier: Diagnosis of  By: Graciela Husbands, MD, Susie Cassette    GERD (gastroesophageal reflux disease)    Hypertension    Hypothyroidism    Long Q-T syndrome    Myocardial infarct (HCC) 11/20/2011   Stent placement for 100% blockage of LAD   PACEMAKER, 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   Qualifier: Diagnosis of  By: Graciela Husbands, MD, Susie Cassette    Rheumatic heart disease    With mitral valve replacement, tricuspid regurgitation moderate to severe,  significant left atrial enlargment   Shortness of breath 07/31/2010   Qualifier: Diagnosis of  By: Caryl Comes, MD, Remus Blake    TRICUSPID REGURGITATION 12/23/2010   Qualifier: Diagnosis of  By: Caryl Comes, MD, Remus Blake    Unspecified essential  hypertension 07/31/2010   Qualifier: Diagnosis of  By: Caryl Comes, MD, [facility], Mack Guise     Past Surgical History:  Procedure Laterality Date   APPENDECTOMY     CARDIAC ELECTROPHYSIOLOGY STUDY AND ABLATION     Stent and pacemaker placement   CHOLECYSTECTOMY     CORONARY ANGIOPLASTY WITH STENT PLACEMENT Left 09/13/2015   Dr. Gwenith Spitz at [facility], LAD stent   [place]  11/20/2007   Dual chamber pacemaker implantation with rapid ventricular pacing   SALPINGECTOMY Bilateral    TONSILLECTOMY      Social History  reports that she has never smoked. She has never used smokeless tobacco. She reports that she does not drink alcohol and does not use drugs.  Allergies  Allergen Reactions   Atenolol     REACTION: low pulse   Metoprolol Succinate [Metoprolol]     Family History  Problem Relation Age of Onset   Leukemia Mother    Stroke Mother    Heart disease Father    Stroke Maternal Grandmother    Heart attack Paternal Grandmother      Prior to Admission medications   Medication Sig Start Date End Date Taking? Authorizing Provider  albuterol (VENTOLIN HFA) 108 (90 Base) MCG/ACT inhaler Inhale 1 puff into the lungs every 6 (six) hours as needed for shortness of breath. 03/21/14   [provider]  ALPRAZolam Duanne Moron) 1 MG tablet Take 1 mg by mouth in the morning and at bedtime.    [provider]  atorvastatin (LIPITOR) 80 MG tablet Take 80 mg by mouth daily. 11/27/11   [provider]  baclofen (LIORESAL) 20 MG tablet Take 1 tablet by mouth every 8 (eight) hours as needed for muscle spasms. 01/03/21   [provider]  candesartan (ATACAND) 8 MG tablet Take 8 mg by mouth daily. 01/06/13   [provider]  DILT-XR 240 MG 24 hr capsule Take 240 mg by mouth 2 (two) times daily. 07/20/21   [provider]  enoxaparin (LOVENOX) 60 MG/0.6ML injection Inject 60 mg into the skin every 12 (twelve) hours.    [provider]  furosemide (LASIX) 40 MG tablet Take 40 mg by mouth daily as needed for edema or fluid.    [provider]  hydrochlorothiazide 25 MG tablet Take 25 mg by mouth daily.      [provider]  levothyroxine (SYNTHROID) 75 MCG tablet Take 75 mcg by mouth daily. 03/12/21 04/11/21  [provider]  levothyroxine (SYNTHROID) 75 MCG tablet Take by mouth. 12/07/20   [provider]  nitroGLYCERIN (NITROSTAT) 0.4 MG SL tablet Place 0.4 mg under the tongue every 5 (five) minutes as needed for chest pain. 01/14/21   [provider]  potassium chloride SA (K-DUR,KLOR-CON) 20 MEQ tablet Take 20 mEq by mouth daily.      [provider]  warfarin (COUMADIN) 2 MG tablet Take 2 mg by mouth daily. Take daily in addition to the 5mg     [provider]  warfarin (COUMADIN) 5 MG tablet Take 5 mg by mouth daily. 05/09/14   [provider]  zolpidem (AMBIEN) 10 MG tablet Take 10 mg by mouth as needed for  sleep. 11/27/11   [provider]    Physical Exam: Vitals:   08/26/21 2200 08/26/21 2215 08/26/21 2230 08/26/21 2315  BP: (!) 133/56 91/63 (!) 118/41 117/61  Pulse: (!) 43 75 65 88  Resp: (!) 21 (!) 23 (!) 24 (!) 21  Temp:      TempSrc:      SpO2: 98% 93% 92% 93%  Weight:      Height:        Constitutional: NAD, calm, comfortable Vitals:   08/26/21 2200 08/26/21 2215 08/26/21 2230 08/26/21 2315  BP: (!) 133/56 91/63 (!) 118/41 117/61  Pulse: (!) 43 75 65 88  Resp: (!) 21 (!) 23 (!) 24 (!) 21  Temp:      TempSrc:      SpO2: 98% 93% 92% 93%  Weight:      Height:       General: WDWN, Alert and oriented x3.  Eyes: EOMI, PERRL, conjunctivae normal.  Sclera nonicteric HENT:  [place]/AT, external ears normal.  Nares patent without epistasis.  Mucous membranes are moist. Normal dentition.  Neck: Soft, normal range of motion, supple, no masses, no thyromegaly.  Trachea midline Respiratory: clear to auscultation bilaterally,  no wheezing, no crackles. Normal respiratory effort. No accessory muscle use.  Cardiovascular: Irregular rhythm with normal rate. 4/6 systolic murmurs. No rubs / gallops. Has lower extremity edema on right more than left. 2+ pedal pulses. JVD present Abdomen: Soft, no tenderness, nondistended, no rebound or guarding.  No masses palpated. Bowel sounds normoactive Musculoskeletal: FROM. no cyanosis. No joint deformity upper and lower extremities. Normal muscle tone.  Skin: Warm, dry, intact no rashes, lesions, ulcers. No induration Neurologic: CN 2-12 grossly intact.  Normal speech.  Sensation intact,. Strength 5/5 in all extremities.   Psychiatric: Normal judgment and insight.  Normal mood.    Labs on Admission: I have personally reviewed following labs and imaging studies  CBC: Recent Labs  Lab 08/26/21 2149  WBC 3.4*  NEUTROABS 2.5  HGB 12.3  HCT 39.8  MCV 90.0  PLT 111*    Basic Metabolic Panel: Recent Labs  Lab 08/22/21 1340 08/26/21 2149  NA 143 130*  K 3.8 3.2*  CL 100 93*  CO2 27 24  GLUCOSE 83 124*  BUN 18 34*  CREATININE 1.25* 1.40*  CALCIUM 9.1 8.6*    GFR: Estimated Creatinine Clearance: 25.8 mL/min (A) (by C-G formula based on SCr of 1.4 mg/dL (H)).  Liver Function Tests: No results for input(s): AST, ALT, ALKPHOS, BILITOT, PROT, ALBUMIN in the last 168 hours.  Urine analysis: No results found for: COLORURINE, APPEARANCEUR, [place], [place], GLUCOSEU, [place], [place], [place], PROTEINUR, UROBILINOGEN, NITRITE, LEUKOCYTESUR  Radiological Exams on Admission: DG Chest Port 1 View  Result Date: 08/26/2021 CLINICAL DATA:  Shortness of breath. EXAM: PORTABLE CHEST 1 VIEW COMPARISON:  Chest radiograph dated 07/21/2021. FINDINGS: Cardiomegaly with vascular congestion. Bilateral mid to lower lung field densities may represent atelectasis, or vascular congestion. Developing infiltrate is not excluded clinical correlation recommended no pneumothorax. Median  sternotomy wires and mechanical cardiac valve. Atherosclerotic calcification of the aorta. Left pectoral pacemaker device. No acute osseous pathology. IMPRESSION: Cardiomegaly with vascular congestion. Developing infiltrate is not excluded. Electronically Signed   By: Anner Crete M.D.   On: 08/26/2021 23:11    EKG: Independently reviewed.  EKG shows atrial fibrillation with LVH by voltage criteria.  No acute ST elevation or depression.  Pacer noted.  QTc 437  Assessment/Plan Principal Problem:   Influenza A Ms.  [name] is admitted to cardiac telemetry floor.  She took first dose of Tamiflu today at 5 PM.  Continue Tamiflu twice daily for 5 days Albuterol MDI as needed for shortness of breath, cough, wheeze Incentive spirometer every 2 hours while awake for pulmonary toilet  Active Problems:   Acute on chronic HFrEF (heart failure with reduced ejection fraction)  Diurese with Lasix 40 mg IV twice daily monitor daily weights. Monitor I&Os Pt had echocardiogram with cardiology recently that showed an EF of 35 to 40% with generalized hypokinesis and aortic stenosis Cardiology been consulted by the ER physician to evaluate and help with fluid management and medication adjustments    Hypoxemia Secondary to influenza.  Oxygen by nasal cannula to keep O2 sat between 92 to 96%    Aortic stenosis Chronic.  Patient is been seen by cardiothoracic surgery and was planning on valve replacement in the near future    Essential hypertension Continue diltiazem HCTZ and ARB therapy.  Monitor blood pressure     Hypokalemia Supplemental potassium given tonight and continue daily potassium.  Recheck electrolytes in am.      CKD 3 Stable. Check renal function with labs in am    Atrial arrhythmia Chronic.  Pacemaker in place    PACEMAKER, PERMANENT Chronic    Anticoagulant long-term use INR supratherapeutic at 3.8 today.  Pharmacy consulted for Coumadin management.  Hold Coumadin at this time.   Recheck INR in morning    DVT prophylaxis: Pt is on coumadin for anticoagulation. INR supratherapeutic today and pharmacy consulted.  Code Status:   Full Code  Family Communication:  Diagnosis and plan discussed with patient.  She verbalized understanding agrees with plan.  Further recommendations to follow as clinical indicated Disposition Plan:   Patient is from:  Home  Anticipated DC to:  Home  Anticipated DC date:  Anticipate 2 midnight or more stay in the hospital  Consults called:  Cardiology consulted by ER physician  Admission status:  Inpatient   Claudean Severance Ogechi Kuehnel MD [facility]  How to contact the [facility] Attending or Consulting provider 7A - 7P or covering provider during after hours 7P -7A, for this patient?   Check the care team in [facility] and look for a) attending/consulting [facility] provider listed and b) the [facility] team listed Log into [email or web address] and use [place]'s universal password to access. If you do not have the password, please contact the hospital operator. Locate the [facility] provider you are looking for under [facility] and page to a number that you can be directly reached. If you still have difficulty reaching the provider, please page the [facility] (Director on Call) for the Hospitalists listed on amion for assistance.  08/26/2021, 11:43 PM

## 2021-08-26 NOTE — ED Provider Notes (Signed)
[facility] EMERGENCY DEPARTMENT Provider Note   CSN: [id number] Arrival date & time: 08/26/21  2136     History Chief Complaint  Patient presents with   Shortness of Breath    [name] [name] is a 75 y.o. female.  75 yo F with a chief complaints of cough shortness of breath fever going on for about 4 days now.  Saw her family doctor today was diagnosed with the flu.  Was started on Tamiflu.  Unfortunately the patient's difficulty breathing is worsened and she called 911.  Found to be hypoxic with a oxygen saturation in the mid 80s placed on 2 L of oxygen with improvement.  The history is provided by the patient.  Shortness of Breath Severity:  Moderate Onset quality:  Gradual Duration:  4 days Timing:  Constant Progression:  Worsening Chronicity:  New Associated symptoms: cough and fever   Associated symptoms: no chest pain, no headaches, no vomiting and no wheezing       Past Medical History:  Diagnosis Date   Anxiety    Aortic aneurysm (HCC)    Atrial arrhythmia    With prior history of atrial flutter ablation   Bradycardia    s/p pacemaker implantation   CONGESTIVE HEART FAILURE 07/31/2010   Qualifier: Diagnosis of  By: Graciela Husbands, MD, Susie Cassette    GERD (gastroesophageal reflux disease)    Hypertension    Hypothyroidism    Long Q-T syndrome    Myocardial infarct (HCC) 11/20/2011   Stent placement for 100% blockage of LAD   PACEMAKER, 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   Qualifier: Diagnosis of  By: Graciela Husbands, MD, Susie Cassette    Rheumatic heart disease    With mitral valve replacement, tricuspid regurgitation moderate to severe, significant left atrial enlargment   TRICUSPID REGURGITATION 12/23/2010   Qualifier: Diagnosis of  By: Graciela Husbands, MD, Susie Cassette    Unspecified essential hypertension 07/31/2010   Qualifier: Diagnosis of  By: Graciela Husbands, MD, Susie Cassette     Patient Active Problem List   Diagnosis Date Noted   Influenza  A 08/26/2021   Acute on chronic HFrEF (heart failure with reduced ejection fraction) (HCC) 08/26/2021   Hypoxemia 08/26/2021   Aortic stenosis 08/26/2021   Anticoagulant long-term use 08/26/2021   CKD (chronic kidney disease) stage 3, GFR 30-59 ml/min (HCC) 08/26/2021   Anxiety 05/13/2021   Aortic aneurysm (HCC) 05/13/2021   Atrial arrhythmia 05/13/2021   Bradycardia 05/13/2021   GERD (gastroesophageal reflux disease) 05/13/2021   Hypothyroidism 05/13/2021   Long Q-T syndrome 05/13/2021   Rheumatic heart disease 05/13/2021   Myocardial infarct (HCC) 11/20/2011   TRICUSPID REGURGITATION 12/23/2010   Essential hypertension 07/31/2010   CONGESTIVE HEART FAILURE 07/31/2010   SHORTNESS OF BREATH 07/31/2010   PACEMAKER, 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 Josefa Half at [facility], LAD stent   OOPHORECTOMY     PACEMAKER INSERTION  11/20/2007   Dual chamber pacemaker implantation with rapid ventricular pacing   SALPINGECTOMY Bilateral    TONSILLECTOMY       OB History   No obstetric history on file.     Family History  Problem Relation Age of Onset   Leukemia Mother    Stroke Mother    Heart  disease Father    Stroke Maternal Grandmother    Heart attack Paternal Grandmother     Social History   Tobacco Use   Smoking status: Never   Smokeless tobacco: Never  Substance Use Topics   Alcohol use: Never   Drug use: Never    Home Medications Prior to Admission medications   Medication Sig Start Date End Date Taking? Authorizing Provider  albuterol (VENTOLIN HFA) 108 (90 Base) MCG/ACT inhaler Inhale 1 puff into the lungs every 6 (six) hours as needed for shortness of breath. 03/21/14   [provider]  ALPRAZolam Duanne Moron) 1 MG tablet Take 1 mg by  mouth in the morning and at bedtime.    [provider]  atorvastatin (LIPITOR) 80 MG tablet Take 80 mg by mouth daily. 11/27/11   [provider]  baclofen (LIORESAL) 20 MG tablet Take 1 tablet by mouth every 8 (eight) hours as needed for muscle spasms. 01/03/21   [provider]  candesartan (ATACAND) 8 MG tablet Take 8 mg by mouth daily. 01/06/13   [provider]  DILT-XR 240 MG 24 hr capsule Take 240 mg by mouth 2 (two) times daily. 07/20/21   [provider]  enoxaparin (LOVENOX) 60 MG/0.6ML injection Inject 60 mg into the skin every 12 (twelve) hours.    [provider]  furosemide (LASIX) 40 MG tablet Take 40 mg by mouth daily as needed for edema or fluid.    [provider]  hydrochlorothiazide 25 MG tablet Take 25 mg by mouth daily.      [provider]  levothyroxine (SYNTHROID) 75 MCG tablet Take 75 mcg by mouth daily. 03/12/21 04/11/21  [provider]  levothyroxine (SYNTHROID) 75 MCG tablet Take by mouth. 12/07/20   [provider]  nitroGLYCERIN (NITROSTAT) 0.4 MG SL tablet Place 0.4 mg under the tongue every 5 (five) minutes as needed for chest pain. 01/14/21   [provider]  potassium chloride SA (K-DUR,KLOR-CON) 20 MEQ tablet Take 20 mEq by mouth daily.      [provider]  warfarin (COUMADIN) 2 MG tablet Take 2 mg by mouth daily. Take daily in addition to the 5mg     [provider]  warfarin (COUMADIN) 5 MG tablet Take 5 mg by mouth daily. 05/09/14   [provider]  zolpidem (AMBIEN) 10 MG tablet Take 10 mg by mouth as needed for sleep. 11/27/11   [provider]    Allergies    Atenolol and Metoprolol succinate [metoprolol]  Review of Systems   Review of Systems  Constitutional:  Positive for chills and fever.  HENT:  Negative for congestion and rhinorrhea.   Eyes:  Negative for redness and visual disturbance.  Respiratory:  Positive for cough  and shortness of breath. Negative for wheezing.   Cardiovascular:  Negative for chest pain and palpitations.  Gastrointestinal:  Negative for nausea and vomiting.  Genitourinary:  Negative for dysuria and urgency.  Musculoskeletal:  Negative for arthralgias and myalgias.  Skin:  Negative for pallor and wound.  Neurological:  Negative for dizziness and headaches.   Physical Exam Updated Vital Signs BP (!) 102/55 (BP Location: Right Arm)   Pulse 64   Temp 98.3 F (36.8 C) (Oral)   Resp 18   Ht 4\' 10"  (1.473 m)   Wt 54.4 kg   SpO2 95%   BMI 25.08 kg/m   Physical Exam Vitals and nursing note reviewed.  Constitutional:      General: She  is not in acute distress.    Appearance: She is well-developed. She is not diaphoretic.  HENT:     Head: Normocephalic and atraumatic.  Eyes:     Pupils: Pupils are equal, round, and reactive to light.  Neck:     Vascular: JVD (to the angle of the jaw) present.  Cardiovascular:     Rate and Rhythm: Normal rate and regular rhythm.     Heart sounds: No murmur heard.   No friction rub. No gallop.  Pulmonary:     Effort: Pulmonary effort is normal.     Breath sounds: Examination of the right-upper field reveals rhonchi. Examination of the left-upper field reveals rhonchi. Examination of the right-middle field reveals rhonchi. Examination of the left-middle field reveals rhonchi. Examination of the right-lower field reveals rhonchi. Examination of the left-lower field reveals rhonchi. Rhonchi present. No wheezing or rales.  Abdominal:     General: There is no distension.     Palpations: Abdomen is soft.     Tenderness: There is no abdominal tenderness.  Musculoskeletal:        General: No tenderness.     Cervical back: Normal range of motion and neck supple.  Skin:    General: Skin is warm and dry.  Neurological:     Mental Status: She is alert and oriented to person, place, and time.  Psychiatric:        Behavior: Behavior normal.    ED  Results / Procedures / Treatments   Labs (all labs ordered are listed, but only abnormal results are displayed) Labs Reviewed  RESP PANEL BY RT-PCR (FLU A&B, COVID) ARPGX2 - Abnormal; Notable for the following components:      Result Value   Influenza A by PCR POSITIVE (*)    All other components within normal limits  CBC WITH DIFFERENTIAL/PLATELET - Abnormal; Notable for the following components:   WBC 3.4 (*)    RDW 15.8 (*)    Platelets 111 (*)    Lymphs Abs 0.4 (*)    All other components within normal limits  BASIC METABOLIC PANEL - Abnormal; Notable for the following components:   Sodium 130 (*)    Potassium 3.2 (*)    Chloride 93 (*)    Glucose, Bld 124 (*)    BUN 34 (*)    Creatinine, Ser 1.40 (*)    Calcium 8.6 (*)    GFR, Estimated 39 (*)    All other components within normal limits  BRAIN NATRIURETIC PEPTIDE - Abnormal; Notable for the following components:   B Natriuretic Peptide 1,295.2 (*)    All other components within normal limits  PROTIME-INR - Abnormal; Notable for the following components:   Prothrombin Time 37.4 (*)    INR 3.8 (*)    All other components within normal limits  PROTIME-INR - Abnormal; Notable for the following components:   Prothrombin Time 38.6 (*)    INR 4.0 (*)    All other components within normal limits  BASIC METABOLIC PANEL - Abnormal; Notable for the following components:   Sodium 131 (*)    Potassium 3.2 (*)    Chloride 94 (*)    Glucose, Bld 117 (*)    BUN 32 (*)    Creatinine, Ser 1.37 (*)    Calcium 8.2 (*)    GFR, Estimated 41 (*)    All other components within normal limits  CBC - Abnormal; Notable for the following components:   WBC 2.9 (*)  Hemoglobin 11.4 (*)    HCT 35.2 (*)    RDW 15.7 (*)    Platelets 96 (*)    All other components within normal limits  TROPONIN I (HIGH SENSITIVITY) - Abnormal; Notable for the following components:   Troponin I (High Sensitivity) 41 (*)    All other components within normal  limits  TROPONIN I (HIGH SENSITIVITY) - Abnormal; Notable for the following components:   Troponin I (High Sensitivity) 41 (*)    All other components within normal limits  MAGNESIUM    EKG EKG Interpretation  Date/Time:  Tuesday August 26 2021 21:43:41 EST Ventricular Rate:  93 PR Interval:    QRS Duration: 134 QT Interval:  444 QTC Calculation: 437 R Axis:   45 Text Interpretation: Atrial fibrillation Ventricular tachycardia, unsustained Aberrant conduction of SV complex(es) LVH with IVCD and secondary repol abnrm Anterior ST elevation, probably due to LVH frequent pvc Confirmed by Deno Etienne [phone]) on 08/26/2021 9:51:25 PM  Radiology DG Chest Port 1 View  Result Date: 08/26/2021 CLINICAL DATA:  Shortness of breath. EXAM: PORTABLE CHEST 1 VIEW COMPARISON:  Chest radiograph dated 07/21/2021. FINDINGS: Cardiomegaly with vascular congestion. Bilateral mid to lower lung field densities may represent atelectasis, or vascular congestion. Developing infiltrate is not excluded clinical correlation recommended no pneumothorax. Median sternotomy wires and mechanical cardiac valve. Atherosclerotic calcification of the aorta. Left pectoral pacemaker device. No acute osseous pathology. IMPRESSION: Cardiomegaly with vascular congestion. Developing infiltrate is not excluded. Electronically Signed   By: Anner Crete M.D.   On: 08/26/2021 23:11    Procedures Procedures   Medications Ordered in ED Medications  atorvastatin (LIPITOR) tablet 80 mg (80 mg Oral Given 08/27/21 0847)  nitroGLYCERIN (NITROSTAT) SL tablet 0.4 mg (has no administration in time range)  ALPRAZolam (XANAX) tablet 1 mg (1 mg Oral Given 08/27/21 0151)  levothyroxine (SYNTHROID) tablet 75 mcg (75 mcg Oral Given 08/27/21 0647)  albuterol (PROVENTIL) (2.5 MG/3ML) 0.083% nebulizer solution 2.5 mg (has no administration in time range)  furosemide (LASIX) injection 40 mg (40 mg Intravenous Given 08/27/21 0850)  sodium chloride  flush (NS) 0.9 % injection 3 mL (3 mLs Intravenous Given 08/27/21 0851)  sodium chloride flush (NS) 0.9 % injection 3 mL (3 mLs Intravenous Given 08/27/21 0152)  0.9 %  sodium chloride infusion (has no administration in time range)  acetaminophen (TYLENOL) tablet 650 mg (650 mg Oral Given 08/27/21 0848)    Or  acetaminophen (TYLENOL) suppository 650 mg ( Rectal See Alternative 08/27/21 0848)  ondansetron (ZOFRAN) tablet 4 mg (has no administration in time range)    Or  ondansetron (ZOFRAN) injection 4 mg (has no administration in time range)  oseltamivir (TAMIFLU) capsule 30 mg (30 mg Oral Given 08/27/21 0205)  feeding supplement (ENSURE ENLIVE / ENSURE PLUS) liquid 237 mL (237 mLs Oral Given 08/27/21 1453)  benzonatate (TESSALON) capsule 200 mg (200 mg Oral Given 08/27/21 0846)  baclofen (LIORESAL) tablet 10 mg (has no administration in time range)  potassium chloride SA (KLOR-CON) CR tablet 40 mEq (40 mEq Oral Given 08/27/21 0850)  pantoprazole (PROTONIX) EC tablet 40 mg (40 mg Oral Given 08/27/21 1143)  potassium chloride SA (KLOR-CON) CR tablet 40 mEq (40 mEq Oral Given 08/27/21 0151)    ED Course  I have reviewed the triage vital signs and the nursing notes.  Pertinent labs & imaging results that were available during my care of the patient were reviewed by me and considered in my medical decision making (see chart for  details).    MDM Rules/Calculators/A&P                           75 yo F with a significant past medical history of severe aortic stenosis comes in with a chief complaints of shortness of breath cough and fever.  Was diagnosed with influenza this morning started on Tamiflu.  Worsening difficulty breathing newly hypoxic arrived by EMS.  Will obtain blood work chest x-ray discussed with medicine.  I did discuss the case with the cardiology fellow, Dr. Conley Canal will come and evaluate the patient.   Discussed with medicine for admission.  CRITICAL CARE Performed by: Cecilio Asper   Total critical care time: 35 minutes  Critical care time was exclusive of separately billable procedures and treating other patients.  Critical care was necessary to treat or prevent imminent or life-threatening deterioration.  Critical care was time spent personally by me on the following activities: development of treatment plan with patient and/or surrogate as well as nursing, discussions with consultants, evaluation of patient's response to treatment, examination of patient, obtaining history from patient or surrogate, ordering and performing treatments and interventions, ordering and review of laboratory studies, ordering and review of radiographic studies, pulse oximetry and re-evaluation of patient's condition.  The patients results and plan were reviewed and discussed.   Any x-rays performed were independently reviewed by myself.   Differential diagnosis were considered with the presenting HPI.  Medications  atorvastatin (LIPITOR) tablet 80 mg (80 mg Oral Given 08/27/21 0847)  nitroGLYCERIN (NITROSTAT) SL tablet 0.4 mg (has no administration in time range)  ALPRAZolam (XANAX) tablet 1 mg (1 mg Oral Given 08/27/21 0151)  levothyroxine (SYNTHROID) tablet 75 mcg (75 mcg Oral Given 08/27/21 0647)  albuterol (PROVENTIL) (2.5 MG/3ML) 0.083% nebulizer solution 2.5 mg (has no administration in time range)  furosemide (LASIX) injection 40 mg (40 mg Intravenous Given 08/27/21 0850)  sodium chloride flush (NS) 0.9 % injection 3 mL (3 mLs Intravenous Given 08/27/21 0851)  sodium chloride flush (NS) 0.9 % injection 3 mL (3 mLs Intravenous Given 08/27/21 0152)  0.9 %  sodium chloride infusion (has no administration in time range)  acetaminophen (TYLENOL) tablet 650 mg (650 mg Oral Given 08/27/21 0848)    Or  acetaminophen (TYLENOL) suppository 650 mg ( Rectal See Alternative 08/27/21 0848)  ondansetron (ZOFRAN) tablet 4 mg (has no administration in time range)    Or  ondansetron  (ZOFRAN) injection 4 mg (has no administration in time range)  oseltamivir (TAMIFLU) capsule 30 mg (30 mg Oral Given 08/27/21 0205)  feeding supplement (ENSURE ENLIVE / ENSURE PLUS) liquid 237 mL (237 mLs Oral Given 08/27/21 1453)  benzonatate (TESSALON) capsule 200 mg (200 mg Oral Given 08/27/21 0846)  baclofen (LIORESAL) tablet 10 mg (has no administration in time range)  potassium chloride SA (KLOR-CON) CR tablet 40 mEq (40 mEq Oral Given 08/27/21 0850)  pantoprazole (PROTONIX) EC tablet 40 mg (40 mg Oral Given 08/27/21 1143)  potassium chloride SA (KLOR-CON) CR tablet 40 mEq (40 mEq Oral Given 08/27/21 0151)    Vitals:   08/27/21 0106 08/27/21 0338 08/27/21 0804 08/27/21 1132  BP: 126/61 (!) 95/47 (!) 97/55 (!) 102/55  Pulse: 72 66 63 64  Resp: 20 20 20 18   Temp: 98.9 F (37.2 C) 98.9 F (37.2 C) 99.2 F (37.3 C) 98.3 F (36.8 C)  TempSrc: Oral Oral Oral Oral  SpO2: 93% 91% 97% 95%  Weight:  Height:        Final diagnoses:  Influenza due to identified novel influenza A virus with other respiratory manifestations  Acute respiratory failure with hypoxia ([place])  Acute on chronic systolic congestive heart failure ([place])    Admission/ observation were discussed with the admitting physician, patient and/or family and they are comfortable with the plan.    Final Clinical Impression(s) / ED Diagnoses Final diagnoses:  Influenza due to identified novel influenza A virus with other respiratory manifestations  Acute respiratory failure with hypoxia ([place])  Acute on chronic systolic congestive heart failure [facility])    Rx / DC Orders ED Discharge Orders     None        Deno Etienne, DO 08/27/21 1503

## 2021-08-27 ENCOUNTER — Encounter (HOSPITAL_COMMUNITY): Payer: Self-pay | Admitting: Family Medicine

## 2021-08-27 DIAGNOSIS — Z7901 Long term (current) use of anticoagulants: Secondary | ICD-10-CM

## 2021-08-27 DIAGNOSIS — I071 Rheumatic tricuspid insufficiency: Secondary | ICD-10-CM

## 2021-08-27 DIAGNOSIS — R5381 Other malaise: Secondary | ICD-10-CM

## 2021-08-27 DIAGNOSIS — I10 Essential (primary) hypertension: Secondary | ICD-10-CM

## 2021-08-27 DIAGNOSIS — Z952 Presence of prosthetic heart valve: Secondary | ICD-10-CM

## 2021-08-27 DIAGNOSIS — Z950 Presence of cardiac pacemaker: Secondary | ICD-10-CM

## 2021-08-27 DIAGNOSIS — I959 Hypotension, unspecified: Secondary | ICD-10-CM

## 2021-08-27 DIAGNOSIS — J9601 Acute respiratory failure with hypoxia: Secondary | ICD-10-CM

## 2021-08-27 DIAGNOSIS — I5023 Acute on chronic systolic (congestive) heart failure: Secondary | ICD-10-CM | POA: Diagnosis not present

## 2021-08-27 DIAGNOSIS — J101 Influenza due to other identified influenza virus with other respiratory manifestations: Secondary | ICD-10-CM | POA: Diagnosis not present

## 2021-08-27 DIAGNOSIS — I350 Nonrheumatic aortic (valve) stenosis: Secondary | ICD-10-CM

## 2021-08-27 DIAGNOSIS — I351 Nonrheumatic aortic (valve) insufficiency: Secondary | ICD-10-CM

## 2021-08-27 DIAGNOSIS — I480 Paroxysmal atrial fibrillation: Secondary | ICD-10-CM

## 2021-08-27 DIAGNOSIS — E876 Hypokalemia: Secondary | ICD-10-CM

## 2021-08-27 DIAGNOSIS — E871 Hypo-osmolality and hyponatremia: Secondary | ICD-10-CM

## 2021-08-27 DIAGNOSIS — N1831 Chronic kidney disease, stage 3a: Secondary | ICD-10-CM

## 2021-08-27 LAB — CBC
HCT: 35.2 % — ABNORMAL LOW (ref 36.0–46.0)
Hemoglobin: 11.4 g/dL — ABNORMAL LOW (ref 12.0–15.0)
MCH: 29 pg (ref 26.0–34.0)
MCHC: 32.4 g/dL (ref 30.0–36.0)
MCV: 89.6 fL (ref 80.0–100.0)
Platelets: 96 10*3/uL — ABNORMAL LOW (ref 150–400)
RBC: 3.93 MIL/uL (ref 3.87–5.11)
RDW: 15.7 % — ABNORMAL HIGH (ref 11.5–15.5)
WBC: 2.9 10*3/uL — ABNORMAL LOW (ref 4.0–10.5)
nRBC: 0 % (ref 0.0–0.2)

## 2021-08-27 LAB — BASIC METABOLIC PANEL
Anion gap: 10 (ref 5–15)
BUN: 32 mg/dL — ABNORMAL HIGH (ref 8–23)
CO2: 27 mmol/L (ref 22–32)
Calcium: 8.2 mg/dL — ABNORMAL LOW (ref 8.9–10.3)
Chloride: 94 mmol/L — ABNORMAL LOW (ref 98–111)
Creatinine, Ser: 1.37 mg/dL — ABNORMAL HIGH (ref 0.44–1.00)
GFR, Estimated: 41 mL/min — ABNORMAL LOW (ref >60)
Glucose, Bld: 117 mg/dL — ABNORMAL HIGH (ref 70–99)
Potassium: 3.2 mmol/L — ABNORMAL LOW (ref 3.5–5.1)
Sodium: 131 mmol/L — ABNORMAL LOW (ref 135–145)

## 2021-08-27 LAB — PROTIME-INR
INR: 4 — ABNORMAL HIGH (ref 0.8–1.2)
Prothrombin Time: 38.6 seconds — ABNORMAL HIGH (ref 11.4–15.2)

## 2021-08-27 LAB — MAGNESIUM: Magnesium: 1.9 mg/dL (ref 1.7–2.4)

## 2021-08-27 LAB — TROPONIN I (HIGH SENSITIVITY): Troponin I (High Sensitivity): 41 ng/L — ABNORMAL HIGH (ref <18)

## 2021-08-27 MED ORDER — ALPRAZOLAM 0.5 MG PO TABS
1.0000 mg | ORAL_TABLET | Freq: Two times a day (BID) | ORAL | Status: DC | PRN
  Administered 2021-08-27 – 2021-08-29 (×4): 1 mg via ORAL
  Filled 2021-08-27 (×5): qty 2

## 2021-08-27 MED ORDER — ENSURE ENLIVE PO LIQD
237.0000 mL | Freq: Two times a day (BID) | ORAL | Status: DC
  Administered 2021-08-27 – 2021-08-28 (×4): 237 mL via ORAL

## 2021-08-27 MED ORDER — SODIUM CHLORIDE 0.9% FLUSH
3.0000 mL | Freq: Two times a day (BID) | INTRAVENOUS | Status: DC
  Administered 2021-08-27 – 2021-08-30 (×5): 3 mL via INTRAVENOUS

## 2021-08-27 MED ORDER — DILTIAZEM HCL ER COATED BEADS 120 MG PO CP24: 120.0000 mg | ORAL_CAPSULE | Freq: Two times a day (BID) | ORAL | Status: DC

## 2021-08-27 MED ORDER — DILTIAZEM HCL ER COATED BEADS 240 MG PO CP24
240.0000 mg | ORAL_CAPSULE | Freq: Two times a day (BID) | ORAL | Status: DC
  Administered 2021-08-27: 240 mg via ORAL
  Filled 2021-08-27 (×2): qty 1
  Filled 2021-08-27 (×2): qty 2

## 2021-08-27 MED ORDER — POTASSIUM CHLORIDE CRYS ER 20 MEQ PO TBCR
40.0000 meq | EXTENDED_RELEASE_TABLET | Freq: Every day | ORAL | Status: DC
  Administered 2021-08-27: 40 meq via ORAL
  Filled 2021-08-27 (×2): qty 2

## 2021-08-27 MED ORDER — HYDROCHLOROTHIAZIDE 25 MG PO TABS: 25.0000 mg | ORAL_TABLET | Freq: Every day | ORAL | Status: DC

## 2021-08-27 MED ORDER — FUROSEMIDE 10 MG/ML IJ SOLN
40.0000 mg | Freq: Two times a day (BID) | INTRAMUSCULAR | Status: AC
  Administered 2021-08-27 – 2021-08-28 (×4): 40 mg via INTRAVENOUS
  Filled 2021-08-27 (×4): qty 4

## 2021-08-27 MED ORDER — BACLOFEN 10 MG PO TABS
10.0000 mg | ORAL_TABLET | Freq: Two times a day (BID) | ORAL | Status: DC | PRN
  Administered 2021-08-28: 10 mg via ORAL
  Filled 2021-08-27 (×3): qty 1

## 2021-08-27 MED ORDER — SODIUM CHLORIDE 0.9% FLUSH
3.0000 mL | INTRAVENOUS | Status: DC | PRN
  Administered 2021-08-27: 3 mL via INTRAVENOUS

## 2021-08-27 MED ORDER — IRBESARTAN 150 MG PO TABS
75.0000 mg | ORAL_TABLET | Freq: Every day | ORAL | Status: DC
  Filled 2021-08-27: qty 1

## 2021-08-27 MED ORDER — OSELTAMIVIR PHOSPHATE 30 MG PO CAPS
30.0000 mg | ORAL_CAPSULE | Freq: Every day | ORAL | Status: DC
  Administered 2021-08-27 – 2021-08-30 (×3): 30 mg via ORAL
  Filled 2021-08-27 (×5): qty 1

## 2021-08-27 MED ORDER — PANTOPRAZOLE SODIUM 40 MG PO TBEC
40.0000 mg | DELAYED_RELEASE_TABLET | Freq: Every day | ORAL | Status: DC
  Administered 2021-08-27 – 2021-08-30 (×4): 40 mg via ORAL
  Filled 2021-08-27 (×4): qty 1

## 2021-08-27 MED ORDER — NITROGLYCERIN 0.4 MG SL SUBL: 0.4000 mg | SUBLINGUAL_TABLET | SUBLINGUAL | Status: DC | PRN

## 2021-08-27 MED ORDER — ONDANSETRON HCL 4 MG/2ML IJ SOLN: 4.0000 mg | Freq: Four times a day (QID) | INTRAMUSCULAR | Status: DC | PRN

## 2021-08-27 MED ORDER — ALBUTEROL SULFATE (2.5 MG/3ML) 0.083% IN NEBU
2.5000 mg | INHALATION_SOLUTION | RESPIRATORY_TRACT | Status: DC | PRN
  Administered 2021-08-28 – 2021-08-30 (×3): 2.5 mg via RESPIRATORY_TRACT
  Filled 2021-08-27 (×3): qty 3

## 2021-08-27 MED ORDER — ADULT MULTIVITAMIN W/MINERALS CH
1.0000 | ORAL_TABLET | Freq: Every day | ORAL | Status: DC
  Administered 2021-08-27 – 2021-08-28 (×2): 1 via ORAL
  Filled 2021-08-27 (×4): qty 1

## 2021-08-27 MED ORDER — ACETAMINOPHEN 325 MG PO TABS
650.0000 mg | ORAL_TABLET | Freq: Four times a day (QID) | ORAL | Status: DC | PRN
  Administered 2021-08-27: 650 mg via ORAL
  Filled 2021-08-27 (×2): qty 2

## 2021-08-27 MED ORDER — ATORVASTATIN CALCIUM 80 MG PO TABS
80.0000 mg | ORAL_TABLET | Freq: Every day | ORAL | Status: DC
  Administered 2021-08-27 – 2021-08-29 (×3): 80 mg via ORAL
  Filled 2021-08-27 (×5): qty 1

## 2021-08-27 MED ORDER — ONDANSETRON HCL 4 MG PO TABS: 4.0000 mg | ORAL_TABLET | Freq: Four times a day (QID) | ORAL | Status: DC | PRN

## 2021-08-27 MED ORDER — BENZONATATE 100 MG PO CAPS
200.0000 mg | ORAL_CAPSULE | Freq: Three times a day (TID) | ORAL | Status: DC | PRN
  Administered 2021-08-27 – 2021-08-28 (×5): 200 mg via ORAL
  Filled 2021-08-27 (×5): qty 2

## 2021-08-27 MED ORDER — POTASSIUM CHLORIDE CRYS ER 20 MEQ PO TBCR
40.0000 meq | EXTENDED_RELEASE_TABLET | Freq: Once | ORAL | Status: AC
  Administered 2021-08-27: 40 meq via ORAL
  Filled 2021-08-27: qty 2

## 2021-08-27 MED ORDER — SODIUM CHLORIDE 0.9 % IV SOLN: 250.0000 mL | INTRAVENOUS | Status: DC | PRN

## 2021-08-27 MED ORDER — POTASSIUM CHLORIDE CRYS ER 20 MEQ PO TBCR: 20.0000 meq | EXTENDED_RELEASE_TABLET | Freq: Every day | ORAL | Status: DC

## 2021-08-27 MED ORDER — LEVOTHYROXINE SODIUM 75 MCG PO TABS
75.0000 ug | ORAL_TABLET | Freq: Every day | ORAL | Status: DC
Start: 2021-08-27 — End: 2021-08-30
  Administered 2021-08-27 – 2021-08-30 (×4): 75 ug via ORAL
  Filled 2021-08-27 (×6): qty 1

## 2021-08-27 MED ORDER — ACETAMINOPHEN 650 MG RE SUPP: 650.0000 mg | Freq: Four times a day (QID) | RECTAL | Status: DC | PRN

## 2021-08-27 NOTE — Progress Notes (Signed)
SATURATION QUALIFICATIONS: (This note is used to comply with regulatory documentation for home oxygen)  Patient Saturations on Room Air at Rest = 83%  Patient Saturations on Room Air while Ambulating = 86%  Patient Saturations on 3 Liters of oxygen while Ambulating = 90%  Please briefly explain why patient needs home oxygen: Pt's sats decrease to 80s% when on RA but remains >/= 90% on 3L of O2.   Raymond Gurney, PT, DPT Acute Rehabilitation Services  Pager: [phone] Office: [phone]

## 2021-08-27 NOTE — Progress Notes (Signed)
ANTICOAGULATION CONSULT NOTE - Follow Up Consult  Pharmacy Consult for Coumadin Indication: Mech MVR + afib,  Allergies  Allergen Reactions   Atenolol     REACTION: low pulse   Metoprolol Succinate [Metoprolol]     Patient Measurements: Height: 4\' 10"  (147.3 cm) Weight: 54.4 kg (120 lb) IBW/kg (Calculated) : 40.9  Vital Signs: Temp: 99.2 F (37.3 C) (11/09 0804) Temp Source: Oral (11/09 0804) BP: 97/55 (11/09 0804) Pulse Rate: 63 (11/09 0804)  Labs: Recent Labs    08/26/21 2149 08/27/21 0001 08/27/21 0155  HGB 12.3  --  11.4*  HCT 39.8  --  35.2*  PLT 111*  --  96*  LABPROT 37.4*  --  38.6*  INR 3.8*  --  4.0*  CREATININE 1.40*  --  1.37*  TROPONINIHS 41* 41*  --     Estimated Creatinine Clearance: 26.3 mL/min (A) (by C-G formula based on SCr of 1.37 mg/dL (H)).    Assessment: Anticoag: Mech MVR + afib, INR 4 up today. (INR goal 2.5-3.5)  - Hgb 12.3>11.4. Plts only 96 - PTA Coumadin 7mg /d, admit INR 3.8  Goal of Therapy:  INR 2.5-3.5 Monitor platelets by anticoagulation protocol: Yes   Plan:  No warfarin tonight Daily PT/INR Resume warfarin as INR declines   [name] [name] S. 13/09/22, PharmD, BCPS Clinical Staff Pharmacist [email or web address]  , [name] 08/27/2021,10:27 AM

## 2021-08-27 NOTE — Consult Note (Signed)
Cardiology Consultation:   Patient ID: [name] [name] MRN: [id number]; DOB: 05-Sep-1946  Admit date: 08/26/2021 Date of Consult: 08/27/2021  PCP:  Ronita Hipps, MD   [place] Providers Cardiologist:  None        Patient Profile:   [name] [name] is a 75 y.o. 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 Deno Etienne.  History of Present Illness:   [name] [name] reports that for the last few days she has had worsening cough with associated shortness of breath.  She further endorses just feeling generally unwell and had a subjective fever at home.  Yesterday she went to her PCP and was found to be flu positive and was started on Tamiflu.  She returned home but given the severity of her symptoms she called EMS who found her to be hypoxic.  She was brought in for evaluation.  Of note the patient has known Multi valvular disease secondary to rheumatic heart disease.  She was last seen in cardiology clinic on 08/22/2021 and during that visit the patient and her cardiologist elected to pursue TAVR evaluation.  On exam today the patient endorses SOB, nonproductive cough but with chest congestion, fevers, general malaise, and chest tightness.  In the ED her VS were T 90 56F, HR 89, BP 128/67, RR 23, satting 93% on 4 L [place].  Labs were notable for leukopenia 3.4, thrombocytopenia 111, sodium 130, potassium 3.2, BUN 34, creatinine 1.4, INR 3.8, troponin 41, BNP 1295.  EKG showed frequent NSVT and PVCs.  Based on the patient's hypoxia and electrolyte derangements, she was admitted to family medicine for influenza treatment.  Cardiology is consulted regarding her aortic stenosis.   Past Medical History:  Diagnosis Date   Anxiety    Aortic aneurysm [facility])    Atrial arrhythmia    With prior  history of atrial flutter ablation   Bradycardia    s/p pacemaker implantation   CONGESTIVE HEART FAILURE 07/31/2010   Qualifier: Diagnosis of  By: Caryl Comes, MD, Remus Blake    GERD (gastroesophageal reflux disease)    Hypertension    Hypothyroidism    Long Q-T syndrome    Myocardial infarct ([place]) 11/20/2011   Stent placement for 100% blockage of LAD   PACEMAKER, 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   Qualifier: Diagnosis of  By: Caryl Comes, MD, Remus Blake    Rheumatic heart disease    With mitral valve replacement, tricuspid regurgitation moderate to severe, significant left atrial enlargment   Shortness of breath 07/31/2010   Qualifier: Diagnosis of  By: Caryl Comes, MD, Remus Blake    TRICUSPID REGURGITATION 12/23/2010   Qualifier: Diagnosis of  By: Caryl Comes, MD, Remus Blake    Unspecified essential hypertension 07/31/2010   Qualifier: Diagnosis of  By: Caryl Comes, MD, [facility], Mack Guise     Past Surgical History:  Procedure Laterality Date   APPENDECTOMY     CARDIAC ELECTROPHYSIOLOGY STUDY AND ABLATION     Stent and pacemaker placement   CHOLECYSTECTOMY     CORONARY ANGIOPLASTY WITH STENT PLACEMENT Left 09/13/2015   Dr. Gwenith Spitz at [facility], LAD stent   [place]  11/20/2007   Dual chamber pacemaker implantation with rapid ventricular pacing   SALPINGECTOMY Bilateral    TONSILLECTOMY  Home Medications:  Prior to Admission medications   Medication Sig Start Date End Date Taking? Authorizing Provider  albuterol (VENTOLIN HFA) 108 (90 Base) MCG/ACT inhaler Inhale 1 puff into the lungs every 6 (six) hours as needed for shortness of breath. 03/21/14   [provider]  ALPRAZolam Duanne Moron) 1 MG tablet Take 1 mg by mouth in the morning and at bedtime.    [provider]  atorvastatin (LIPITOR) 80 MG tablet Take 80 mg by mouth daily. 11/27/11   [provider]  baclofen (LIORESAL) 20 MG tablet Take 1 tablet by mouth every  8 (eight) hours as needed for muscle spasms. 01/03/21   [provider]  candesartan (ATACAND) 8 MG tablet Take 8 mg by mouth daily. 01/06/13   [provider]  DILT-XR 240 MG 24 hr capsule Take 240 mg by mouth 2 (two) times daily. 07/20/21   [provider]  enoxaparin (LOVENOX) 60 MG/0.6ML injection Inject 60 mg into the skin every 12 (twelve) hours.    [provider]  furosemide (LASIX) 40 MG tablet Take 40 mg by mouth daily as needed for edema or fluid.    [provider]  hydrochlorothiazide 25 MG tablet Take 25 mg by mouth daily.      [provider]  levothyroxine (SYNTHROID) 75 MCG tablet Take 75 mcg by mouth daily. 03/12/21 04/11/21  [provider]  levothyroxine (SYNTHROID) 75 MCG tablet Take by mouth. 12/07/20   [provider]  nitroGLYCERIN (NITROSTAT) 0.4 MG SL tablet Place 0.4 mg under the tongue every 5 (five) minutes as needed for chest pain. 01/14/21   [provider]  potassium chloride SA (K-DUR,KLOR-CON) 20 MEQ tablet Take 20 mEq by mouth daily.      [provider]  warfarin (COUMADIN) 2 MG tablet Take 2 mg by mouth daily. Take daily in addition to the 5mg     [provider]  warfarin (COUMADIN) 5 MG tablet Take 5 mg by mouth daily. 05/09/14   [provider]  zolpidem (AMBIEN) 10 MG tablet Take 10 mg by mouth as needed for sleep. 11/27/11   [provider]    Inpatient Medications: Scheduled Meds:  atorvastatin  80 mg Oral Daily   diltiazem  240 mg Oral BID   feeding supplement  237 mL Oral BID BM   furosemide  40 mg Intravenous BID   hydrochlorothiazide  25 mg Oral Daily   irbesartan  75 mg Oral Daily   levothyroxine  75 mcg Oral Daily   oseltamivir  30 mg Oral Daily   potassium chloride SA  20 mEq Oral Daily   sodium chloride flush  3 mL Intravenous Q12H   Continuous Infusions:  sodium chloride     PRN Meds: sodium chloride, acetaminophen **OR**  acetaminophen, albuterol, ALPRAZolam, baclofen, benzonatate, nitroGLYCERIN, ondansetron **OR** ondansetron (ZOFRAN) IV, sodium chloride flush  Allergies:    Allergies  Allergen Reactions   Atenolol     REACTION: low pulse   Metoprolol Succinate [Metoprolol]     Social History:   Social History   Socioeconomic History   Marital status: Single    Spouse name: Not on file   Number of children: Not on file   Years of education: Not on file   Highest education level: Not on file  Occupational History   Not on file  Tobacco Use   Smoking status: Never   Smokeless tobacco: Never  Substance and Sexual Activity   Alcohol use: Never  Drug use: Never   Sexual activity: Not on file  Other Topics Concern   Not on file  Social History Narrative   Not on file   Social Determinants of Health   Financial Resource Strain: Not on file  Food Insecurity: Not on file  Transportation Needs: Not on file  Physical Activity: Not on file  Stress: Not on file  Social Connections: Not on file  Intimate Partner Violence: Not on file    Family History:    Family History  Problem Relation Age of Onset   Leukemia Mother    Stroke Mother    Heart disease Father    Stroke Maternal Grandmother    Heart attack Paternal Grandmother      ROS:  Please see the history of present illness.   All other ROS reviewed and negative.     Physical Exam/Data:   Vitals:   08/26/21 2330 08/26/21 2345 08/27/21 0106 08/27/21 0338  BP: (!) 119/58 (!) 112/58 126/61 (!) 95/47  Pulse: 63 78 72 66  Resp: (!) 24 (!) 23 20 20   Temp:   98.9 F (37.2 C) 98.9 F (37.2 C)  TempSrc:   Oral Oral  SpO2: 93% 92% 93% 91%  Weight:      Height:       No intake or output data in the 24 hours ending 08/27/21 0644 Last 3 Weights 08/26/2021 08/22/2021 03/14/2021  Weight (lbs) 120 lb 123 lb 119 lb  Weight (kg) 54.432 kg 55.792 kg 53.978 kg     Body mass index is 25.08 kg/m.  General: Chronically ill-appearing  female in mild distress HEENT: normal, atraumatic, normocephalic Neck: JVD to mandible Vascular: Distal pulses 2+ bilaterally Cardiac: Mechanical click, RRR; systolic murmur heard best at LUSB, no rubs or gallops Lungs: Diffuse rhonchi and bronchial breath sounds bilaterally, faint expiratory wheezes, no rales Abd: soft, nontender, no hepatomegaly  Ext: no edema Musculoskeletal:  No deformities, BUE and BLE strength normal and equal Skin: warm and dry, varicose veins on legs Neuro: Grossly normal Psych:  Normal affect   EKG:  The EKG was personally reviewed and demonstrates:        Telemetry:  Telemetry was personally reviewed and demonstrates: Frequent ectopy with occasional pacing  Relevant CV Studies:  TTE 08/14/10:  Study Conclusions    - Left ventricle: The cavity size was normal. There was mild     concentric hypertrophy. Systolic function was normal. The     estimated ejection fraction was in the range of 55% to 65%. Wall     motion was normal; there were no regional wall motion     abnormalities.   - Aortic valve: Mild to moderate regurgitation. Peak gradient: 55mm     Hg (S). Valve area: 1.5cm^2(VTI).   - Mitral valve: Bio-Prosthetic mitral valve. No regurgitation. Mean     gradient: 57mm Hg (D).   - Left atrium: The atrium was moderately dilated.   - Right ventricle: Systolic function was normal.   - Tricuspid valve: Moderate to severe regurgitation.   - Pulmonary arteries: Systolic pressure was elevated consistent with     at least mild to moderate pulmonary HTN. PA peak pressure: 50mm Hg     (S).   Laboratory Data:  High Sensitivity Troponin:   Recent Labs  Lab 08/26/21 2149 08/27/21 0001  TROPONINIHS 41* 41*     Chemistry Recent Labs  Lab 08/22/21 1340 08/26/21 2149 08/27/21 0155  NA 143 130* 131*  K 3.8 3.2*  3.2*  CL 100 93* 94*  CO2 27 24 27   GLUCOSE 83 124* 117*  BUN 18 34* 32*  CREATININE 1.25* 1.40* 1.37*  CALCIUM 9.1 8.6* 8.2*   GFRNONAA  --  39* 41*  ANIONGAP  --  13 10    No results for input(s): PROT, ALBUMIN, AST, ALT, ALKPHOS, BILITOT in the last 168 hours. Lipids No results for input(s): CHOL, TRIG, HDL, LABVLDL, LDLCALC, CHOLHDL in the last 168 hours.  Hematology Recent Labs  Lab 08/26/21 2149 08/27/21 0155  WBC 3.4* 2.9*  RBC 4.42 3.93  HGB 12.3 11.4*  HCT 39.8 35.2*  MCV 90.0 89.6  MCH 27.8 29.0  MCHC 30.9 32.4  RDW 15.8* 15.7*  PLT 111* 96*   Thyroid No results for input(s): TSH, FREET4 in the last 168 hours.  BNP Recent Labs  Lab 08/26/21 2149  BNP 1,295.2*    DDimer No results for input(s): DDIMER in the last 168 hours.   Radiology/Studies:  DG Chest Port 1 View  Result Date: 08/26/2021 CLINICAL DATA:  Shortness of breath. EXAM: PORTABLE CHEST 1 VIEW COMPARISON:  Chest radiograph dated 07/21/2021. FINDINGS: Cardiomegaly with vascular congestion. Bilateral mid to lower lung field densities may represent atelectasis, or vascular congestion. Developing infiltrate is not excluded clinical correlation recommended no pneumothorax. Median sternotomy wires and mechanical cardiac valve. Atherosclerotic calcification of the aorta. Left pectoral pacemaker device. No acute osseous pathology. IMPRESSION: Cardiomegaly with vascular congestion. Developing infiltrate is not excluded. Electronically Signed   By: Anner Crete M.D.   On: 08/26/2021 23:11     Assessment and Plan:   [name] [name] is a 75 y.o. 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 Deno Etienne.  #Severe AS #Moderate AR :: Patient has known severe AS and is currently being worked up for TAVR.  She currently has URI symptoms and this tested positive for influenza A.  Although her severe aortic stenosis is not helping, I do not  believe that her AAS is contributing to her clinical picture at this point in time.  She actually appears euvolemic on my physical exam.  Her elevated JVD is the result of severe TR and not necessarily volume overload.  Furthermore, I hear no crackles in her lungs only bronchial breath sounds and rhonchi.  I do agree that ultimately she needs her aortic valve replaced, but this hospitalization as she recovers from the flu and an AKI is not the appropriate setting for this.  Additionally, I would not subject her to prolonged pre-TAVR imaging studies either given that she is actively fighting influenza.  I recommend having the patient follow-up with cardiology upon discharge to ensure that her previously scheduled TAVR evaluation can continue when she feels better.  #Rheumatic MV Disease s/p MVR :: INR was slightly supratherapeutic at 3.8 on admission.  For mechanical mitral valve with goal INR to be 2.5-3.5.  Please follow-up on daily INRs and treat accordingly. -Daily INR while in-house -Continue warfarin per pharmacy   Risk Assessment/Risk Scores:                For questions or updates, please contact [place] Please consult [email or web address] for contact info under    Signed, Hershal Coria, MD  08/27/2021 6:44 AM

## 2021-08-27 NOTE — Evaluation (Signed)
Physical Therapy Evaluation Patient Details Name: [name] [name] MRN: [id number] DOB: 10-26-1945 Today's Date: 08/27/2021  History of Present Illness  Pt is a 75 y/o female admitted 08/26/2021 for influenza treatment (including hypoxia) and evaluation of severe aortic stenosis PMH includes rheumatic MV/AV disease s/p mechanical MVR (on warfarin), moderate AR, mod-severe TR, PPM, hypothyroidism, aortic aneurysm, and GERD   Clinical Impression  Pt presents with condition above and deficits mentioned below, see PT Problem List. PTA, she was independent, using furniture for support with mobility intermittently, and living alone in a mobile home with 5 STE. Currently, pt displays balance deficits, decreased activity tolerance/aerobic endurance, and mild lower extremity weakness. Pt needed x1 standing rest break to ambulate a household distance this date, needing UE support more consistently as she fatigued. At baseline, pt reports she can walk as long as she wants without fatigue. Pt would benefit from working with mobility specialists to improve her endurance. Expect pt will quickly return to her baseline as her endurance improved. Pt would benefit from further acute PT to maximize her return to baseline. See General Comments below in regards to SpO2 levels during session.     Recommendations for follow up therapy are one component of a multi-disciplinary discharge planning process, led by the attending physician.  Recommendations may be updated based on patient status, additional functional criteria and insurance authorization.  Follow Up Recommendations No PT follow up    Assistance Recommended at Discharge Intermittent Supervision/Assistance  Functional Status Assessment Patient has had a recent decline in their functional status and demonstrates the ability to make significant improvements in function in a reasonable and predictable amount of time.  Equipment Recommendations  None recommended  by PT    Recommendations for Other Services       Precautions / Restrictions Precautions Precautions: Other (comment);Fall Precaution Comments: watch 02 Restrictions Weight Bearing Restrictions: No      Mobility  Bed Mobility Overal bed mobility: Modified Independent             General bed mobility comments: Pt able to transition supine > sit L EOB with use of bed rails, HOB elevated, without assistance.    Transfers Overall transfer level: Needs assistance Equipment used: None Transfers: Sit to/from Stand Sit to Stand: Supervision           General transfer comment: Supervision for safety, no LOB.    Ambulation/Gait Ambulation/Gait assistance: Min guard Gait Distance (Feet): 150 Feet Assistive device: 1 person hand held assist;None Gait Pattern/deviations: Step-through pattern;Decreased stride length;Narrow base of support Gait velocity: reduced Gait velocity interpretation: <1.8 ft/sec, indicate of risk for recurrent falls   General Gait Details: Pt alternating between no UE support and 1 UE support on wall rail or furniture, more so as she fatigued. x1 standing rest break due to feeling "weak". Mild unsteadiness with poor narrow feet placement intermittently, but no LOB, min guard for safety.  Stairs            Wheelchair Mobility    Modified Rankin (Stroke Patients Only)       Balance Overall balance assessment: Mild deficits observed, not formally tested                                           Pertinent Vitals/Pain Pain Assessment: No/denies pain    Home Living Family/patient expects to be discharged to:: Private residence Living  Arrangements: Alone Available Help at Discharge: Family;Available PRN/intermittently Type of Home: Mobile home Home Access: Stairs to enter Entrance Stairs-Rails: Right;Left;Can reach both Entrance Stairs-Number of Steps: 5   Home Layout: One level Home Equipment: None Additional  Comments: low energy, often sleeps in recliner, used to enjoy gardening    Prior Function Prior Level of Function : Independent/Modified Independent             Mobility Comments: "sometimes I furniture surf" ADLs Comments: indpendent     Hand Dominance   Dominant Hand: Right    Extremity/Trunk Assessment   Upper Extremity Assessment Upper Extremity Assessment: Defer to OT evaluation    Lower Extremity Assessment Lower Extremity Assessment: Generalized weakness (MMT scores of 4 to 5 grossly; denies numbness/tingling)    Cervical / Trunk Assessment Cervical / Trunk Assessment: Kyphotic  Communication   Communication: No difficulties  Cognition Arousal/Alertness: Awake/alert Behavior During Therapy: WFL for tasks assessed/performed Overall Cognitive Status: Within Functional Limits for tasks assessed                                          General Comments General comments (skin integrity, edema, etc.): SpO2 as low as 83% on RA at rest, as low as 86% on RA with gait, >/= 90% on 3L O2 via [place] with gait and at rest    Exercises     Assessment/Plan    PT Assessment Patient needs continued PT services  PT Problem List Decreased strength;Decreased activity tolerance;Decreased balance;Decreased mobility;Cardiopulmonary status limiting activity       PT Treatment Interventions DME instruction;Gait training;Stair training;Functional mobility training;Therapeutic activities;Therapeutic exercise;Balance training;Neuromuscular re-education;Patient/family education    PT Goals (Current goals can be found in the Care Plan section)  Acute Rehab PT Goals Patient Stated Goal: to improve PT Goal Formulation: With patient Time For Goal Achievement: 09/10/21 Potential to Achieve Goals: Good    Frequency Min 3X/week   Barriers to discharge        Co-evaluation               AM-PAC PT "6 Clicks" Mobility  Outcome Measure Help needed turning from  your back to your side while in a flat bed without using bedrails?: None Help needed moving from lying on your back to sitting on the side of a flat bed without using bedrails?: None Help needed moving to and from a bed to a chair (including a wheelchair)?: A Little Help needed standing up from a chair using your arms (e.g., wheelchair or bedside chair)?: A Little Help needed to walk in hospital room?: A Little Help needed climbing 3-5 steps with a railing? : A Little 6 Click Score: 20    End of Session Equipment Utilized During Treatment: Oxygen Activity Tolerance: Patient tolerated treatment well Patient left: in chair;with call bell/phone within reach;Other (comment) (pt verbalizing understanding to call for nursing if need to get back to bed but able to transfer recliner <> commode as needed without nursing) Nurse Communication: Mobility status;Other (comment) (sats) PT Visit Diagnosis: Unsteadiness on feet (R26.81);Other abnormalities of gait and mobility (R26.89);Muscle weakness (generalized) (M62.81);Difficulty in walking, not elsewhere classified (R26.2)    Time: 7989-2119 PT Time Calculation (min) (ACUTE ONLY): 19 min   Charges:   PT Evaluation $PT Eval Moderate Complexity: 1 Mod          Raymond Gurney, PT, DPT Acute Rehabilitation Services  Pager: [phone] Office: [phone]   Henrene Dodge Pettis 08/27/2021, 4:08 PM

## 2021-08-27 NOTE — Progress Notes (Addendum)
PROGRESS NOTE  [name] [name] [id number] DOB: 1946/08/18   PCP: Ronita Hipps, MD  Patient is from: Home.  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 fib, SSS/PPM, RBBB and CKD-3 presenting with chest pressure, shortness of breath, cough, congestion and fever, and admitted for acute hypoxic RF due to influenza A infection and acute on chronic systolic CHF.  Initially seen at PCP office and diagnosed with influenza A and sent home on Tamiflu but symptoms gotten worse and called EMS.    On admission, CXR with cardiomegaly and vascular congestion.  INR 3.8.  BNP 1295.  Troponin 41.  Started on IV Lasix and Tamiflu.  Cardiology consulted.  Subjective: Seen and examined earlier this morning.  No major events overnight of this morning.  Soft blood pressure this morning.  Feels better from breathing standpoint.  She denies chest pain.  Reports cough.  She denies GI or UTI symptoms.  Objective: Vitals:   08/27/21 0106 08/27/21 0338 08/27/21 0804 08/27/21 1132  BP: 126/61 (!) 95/47 (!) 97/55 (!) 102/55  Pulse: 72 66 63 64  Resp: 20 20 20 18   Temp: 98.9 F (37.2 C) 98.9 F (37.2 C) 99.2 F (37.3 C) 98.3 F (36.8 C)  TempSrc: Oral Oral Oral Oral  SpO2: 93% 91% 97% 95%  Weight:      Height:        Intake/Output Summary (Last 24 hours) at 08/27/2021 1151 Last data filed at 08/27/2021 0800 Gross per 24 hour  Intake 118 ml  Output --  Net 118 ml   Filed Weights   08/26/21 2138  Weight: 54.4 kg    Examination:  GENERAL: No apparent distress.  Nontoxic. HEENT: MMM.  Vision and hearing grossly intact.  NECK: Supple.  No apparent JVD.  RESP: 91% on 4 L.  No IWOB.  Rhonchi bilaterally. CVS:  RRR.  2/6 SEM over RUSB.   Mechanical heart sound over LLSB.  ABD/GI/GU: BS+. Abd soft, NTND.  MSK/EXT:  Moves extremities. No apparent deformity.  Trace edema bilaterally. SKIN: no apparent skin lesion or wound NEURO: Awake, alert and oriented appropriately.  No apparent focal neuro deficit. PSYCH: Calm. Normal affect.   Procedures:  None  Microbiology summarized: COVID-19 PCR negative. Influenza A PCR positive.  Assessment & Plan: Acute respiratory failure with hypoxia due to influenza A infection and CHF exacerbation: Reportedly desaturated at PCP office and was started on nasal cannula.  Currently requiring 4 L to maintain saturation in the low 90s. -Continue Tamiflu and IV diuretics -Nebulizers -Wean oxygen, IS, PT/OT and ambulatory saturation  Influenza A infection: -Management as above.  Acute on chronic systolic CHF: TTE with LVEF of 30 to 35% (from 25% previously).  BNP elevated to 1300.  CXR with vascular congestion and cardiomegaly.  She has edema bilaterally.  Started on IV Lasix 40 mg twice daily.  I&O was not captured.  Still with significant oxygen requirement and BLE edema. -Cardiology following. -Continue IV Lasix 40 mg twice daily -Hold HCTZ and ARB given soft blood pressures. -Monitor fluid status, renal functions and electrolytes. -Sodium and fluid restriction   Elevated troponin/history of CAD/remote stenting-has chest pressure on presentation likely due to the above.  Mildly elevated troponin at  41x2 suggests demand ischemia versus ACS. -Manage CHF and influenza as above.  Severe aortic stenosis -Undergoing evaluation for TAVR outpatient.  Severe tricuspid valve regurgitation: She might be preload dependent. -Cautious with cardiac meds and diuretics   Hypotension/history of essential hypertension: Was hypotensive to 95/47 earlier this morning. 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 fib: Rate controlled.  On Cardizem and warfarin at home -Continue Cardizem CD.  Has history of BB intolerance -Warfarin per pharmacy -Pharmacy managing warfarin  Hyponatremia/hypokalemia-likely due to HCTZ -Discontinued HCTZ. -IV Lasix as above -Replace K  CKD-3A/azotemia: Relatively stable. Recent Labs    08/22/21 1340 08/26/21 2149 08/27/21 0155  BUN 18 34* 32*  CREATININE 1.25* 1.40* 1.37*  -Continue monitoring  History of SSS/PPM -Per cardiology.  Pancytopenia: likely reactive process -Continue monitoring   Physical deconditioning -PT/OT eval  Body mass index is 25.08 kg/m.         DVT prophylaxis:  Patient with supratherapeutic INR on warfarin.  Code Status: Full code Family Communication: Patient and/or RN. Available if any question.  Level of care: Telemetry Cardiac Status is: Inpatient  Remains inpatient appropriate because: Acute hypoxic respiratory failure, acute on chronic systolic CHF requiring IV fluid    Consultants:  Cardiology   Sch Meds:  Scheduled Meds:  atorvastatin  80 mg Oral Daily   feeding supplement  237 mL Oral BID BM   furosemide  40 mg Intravenous BID   levothyroxine  75 mcg Oral Daily   oseltamivir  30 mg Oral Daily   pantoprazole  40 mg Oral Daily   potassium chloride SA  40 mEq Oral Daily   sodium chloride flush  3 mL Intravenous Q12H   Continuous Infusions:  sodium chloride     PRN Meds:.sodium chloride, acetaminophen **OR** acetaminophen, albuterol, ALPRAZolam, baclofen, benzonatate, nitroGLYCERIN, ondansetron **OR** ondansetron (ZOFRAN) IV, sodium chloride flush  Antimicrobials: Anti-infectives (From admission, onward)    Start     Dose/Rate Route Frequency Ordered Stop   08/27/21 0200  oseltamivir (TAMIFLU) capsule 30 mg       Note to Pharmacy: Tamiflu 30 mg daily for CrCl <  30 mL/min   30 mg Oral Daily 08/27/21 0105 09/01/21 0959        I have personally  reviewed the following labs and images: CBC: Recent Labs  Lab 08/26/21 2149 08/27/21 0155  WBC 3.4* 2.9*  NEUTROABS 2.5  --   HGB 12.3 11.4*  HCT 39.8 35.2*  MCV 90.0 89.6  PLT 111* 96*   BMP &GFR Recent Labs  Lab 08/22/21 1340 08/26/21 2149 08/27/21 0155  NA 143 130* 131*  K 3.8 3.2* 3.2*  CL 100 93* 94*  CO2 27 24 27   GLUCOSE 83 124* 117*  BUN 18 34* 32*  CREATININE 1.25* 1.40* 1.37*  CALCIUM 9.1 8.6* 8.2*  MG  --   --  1.9   Estimated Creatinine Clearance: 26.3 mL/min (A) (by C-G formula based on SCr of 1.37 mg/dL (H)). Liver & Pancreas: No results for input(s): AST, ALT, ALKPHOS, BILITOT, PROT, ALBUMIN in the last 168 hours. No results for input(s): LIPASE, AMYLASE in the last 168 hours. No results for input(s): AMMONIA in the last 168 hours. Diabetic: No results for input(s): HGBA1C in the last 72 hours. No results for input(s): GLUCAP in the last 168 hours. Cardiac Enzymes: No results for input(s): CKTOTAL, CKMB, CKMBINDEX, TROPONINI in the last  168 hours. No results for input(s): PROBNP in the last 8760 hours. Coagulation Profile: Recent Labs  Lab 08/26/21 2149 08/27/21 0155  INR 3.8* 4.0*   Thyroid Function Tests: No results for input(s): TSH, T4TOTAL, FREET4, T3FREE, THYROIDAB in the last 72 hours. Lipid Profile: No results for input(s): CHOL, HDL, LDLCALC, TRIG, CHOLHDL, LDLDIRECT in the last 72 hours. Anemia Panel: No results for input(s): VITAMINB12, FOLATE, FERRITIN, TIBC, IRON, RETICCTPCT in the last 72 hours. Urine analysis: No results found for: COLORURINE, APPEARANCEUR, LABSPEC, PHURINE, GLUCOSEU, HGBUR, BILIRUBINUR, KETONESUR, PROTEINUR, UROBILINOGEN, NITRITE, LEUKOCYTESUR Sepsis Labs: Invalid input(s): PROCALCITONIN, [place]  Microbiology: Recent Results (from the past 240 hour(s))  Resp Panel by RT-PCR (Flu A&B, Covid) Nasopharyngeal Swab     Status: Abnormal   Collection Time: 08/26/21  9:49 PM   Specimen: Nasopharyngeal Swab;  Nasopharyngeal(NP) swabs in vial transport medium  Result Value Ref Range Status   SARS Coronavirus 2 by RT PCR NEGATIVE NEGATIVE Final    Comment: (NOTE) SARS-CoV-2 target nucleic acids are NOT DETECTED.  The SARS-CoV-2 RNA is generally detectable in upper respiratory specimens during the acute phase of infection. The lowest concentration of SARS-CoV-2 viral copies this assay can detect is 138 copies/mL. A negative result does not preclude SARS-Cov-2 infection and should not be used as the sole basis for treatment or other patient management decisions. A negative result may occur with  improper specimen collection/handling, submission of specimen other than nasopharyngeal swab, presence of viral mutation(s) within the areas targeted by this assay, and inadequate number of viral copies(<138 copies/mL). A negative result must be combined with clinical observations, patient history, and epidemiological information. The expected result is Negative.  Fact Sheet for Patients:  EntrepreneurPulse.com.au  Fact Sheet for Healthcare Providers:  IncredibleEmployment.be  This test is no t yet approved or cleared by the [place] FDA and  has been authorized for detection and/or diagnosis of SARS-CoV-2 by FDA under an Emergency Use Authorization (EUA). This EUA will remain  in effect (meaning this test can be used) for the duration of the COVID-19 declaration under Section 564(b)(1) of the Act, 21 U.S.C.section 360bbb-3(b)(1), unless the authorization is terminated  or revoked sooner.       Influenza A by PCR POSITIVE (A) NEGATIVE Final   Influenza B by PCR NEGATIVE NEGATIVE Final    Comment: (NOTE) The Xpert Xpress SARS-CoV-2/FLU/RSV plus assay is intended as an aid in the diagnosis of influenza from Nasopharyngeal swab specimens and should not be used as a sole basis for treatment. Nasal washings and aspirates are unacceptable for Xpert Xpress  SARS-CoV-2/FLU/RSV testing.  Fact Sheet for Patients: EntrepreneurPulse.com.au  Fact Sheet for Healthcare Providers: IncredibleEmployment.be  This test is not yet approved or cleared by the [place] FDA and has been authorized for detection and/or diagnosis of SARS-CoV-2 by FDA under an Emergency Use Authorization (EUA). This EUA will remain in effect (meaning this test can be used) for the duration of the COVID-19 declaration under Section 564(b)(1) of the Act, 21 U.S.C. section 360bbb-3(b)(1), unless the authorization is terminated or revoked.  Performed at [facility], [place] [address], [place], [place] [address]     Radiology Studies: DG Chest Port 1 View  Result Date: 08/26/2021 CLINICAL DATA:  Shortness of breath. EXAM: PORTABLE CHEST 1 VIEW COMPARISON:  Chest radiograph dated 07/21/2021. FINDINGS: Cardiomegaly with vascular congestion. Bilateral mid to lower lung field densities may represent atelectasis, or vascular congestion. Developing infiltrate is not excluded clinical correlation recommended no pneumothorax. Median  sternotomy wires and mechanical cardiac valve. Atherosclerotic calcification of the aorta. Left pectoral pacemaker device. No acute osseous pathology. IMPRESSION: Cardiomegaly with vascular congestion. Developing infiltrate is not excluded. Electronically Signed   By: Anner Crete M.D.   On: 08/26/2021 23:11    55 minutes with more than 50% spent in reviewing records, counseling patient/family and coordinating care.   Adelma Bowdoin T. Grygla  If 7PM-7AM, please contact night-coverage [email or web address] 08/27/2021, 11:51 AM

## 2021-08-27 NOTE — Progress Notes (Signed)
[facility] night cross cover note:  I was contacted by RN who conveyed request for prn antitussive.  Per my chart review, this appears to be a 75 year old female admitted earlier this evening for influenza A infection along with acute on chronic systolic heart failure.  I subsequently placed orders for prn Tessalon Perles as well as flutter valve.     Newton Pigg, DO Hospitalist

## 2021-08-27 NOTE — Progress Notes (Addendum)
Initial Nutrition Assessment  DOCUMENTATION CODES:   Non-severe (moderate) malnutrition in context of chronic illness  INTERVENTION:  - Continue Ensure Enlive po BID, each supplement provides 350 kcal and 20 grams of protein  - MVI with minerals daily  - Spoke with RN about monitoring swallowing difficulty at mealtime. RN placed SLP eval   NUTRITION DIAGNOSIS:   Moderate Malnutrition related to chronic illness (CHF and CKD3) as evidenced by mild fat depletion, moderate muscle depletion.  GOAL:   Patient will meet greater than or equal to 90% of their needs  MONITOR:   PO intake, Supplement acceptance, Diet advancement, Labs, Weight trends  REASON FOR ASSESSMENT:   Malnutrition Screening Tool    ASSESSMENT:   Pt admitted with acute hypoxic respiratory failure secondary to influenza. PMH includes CKDIII, aortic stenosis, afib, HTN, and CHF.  Pt sitting in chair having just eaten breakfast. Observed about 50-75% of meal completion. She states that since having COVID last year, her taste has changed. Pt lives alone, shops and prepares her own meals. She only eats when she is hungry and these foods typically include fresh vegetables, tomato sandwiches and spaghetti but does not prefer meats except chicken. She reports having some difficulty swallowing, that food gets stuck in her chest when eating. Spoke with pt about taking small bites of food and sips of water. Reached out to RN with concerns over swallowing difficulty and asked her to continue to monitor symptoms during mealtime. She denies wanting modified diet and that she is finding foods that she can tolerate well. Spoke with her about continuing Ensure and she is amenable.  She states that her a weight before COVID was around 133 lbs and that lost some weight but is uncertain how much. Limited weight history documentation noted, however recently documented weights appear consistent.  Medications: lasix, synthroid, protonix,  KCl   Labs: sodium 131, potassium 3.2, BUN 32, Cr 1.37  NUTRITION - FOCUSED PHYSICAL EXAM:  Flowsheet Row Most Recent Value  Orbital Region Moderate depletion  Upper Arm Region No depletion  Thoracic and Lumbar Region Mild depletion  Buccal Region Mild depletion  Temple Region Moderate depletion  Clavicle Bone Region Moderate depletion  [L sided packmaker]  Clavicle and Acromion Bone Region Moderate depletion  Scapular Bone Region Mild depletion  Dorsal Hand Moderate depletion  Patellar Region Moderate depletion  Anterior Thigh Region Moderate depletion  Posterior Calf Region Moderate depletion  Edema (RD Assessment) Mild  Hair Reviewed  Eyes Reviewed  Mouth Reviewed  [cracks in tongue]  Skin Reviewed  Nails Reviewed       Diet Order:   Diet Order             Diet Heart Room service appropriate? Yes; Fluid consistency: Thin  Diet effective now                   EDUCATION NEEDS:   Education needs have been addressed  Skin:  Skin Assessment: Reviewed RN Assessment  Last BM:  08/26/21  Height:   Ht Readings from Last 1 Encounters:  08/26/21 4\' 10"  (1.473 m)    Weight:   Wt Readings from Last 1 Encounters:  08/26/21 54.4 kg    BMI:  Body mass index is 25.08 kg/m.  Estimated Nutritional Needs:   Kcal:  1500-1700  Protein:  75-85g  Fluid:  >1.5L   13/08/22, RDN, LDN Clinical Nutrition

## 2021-08-27 NOTE — Evaluation (Signed)
Occupational Therapy Evaluation Patient Details Name: [name] [name] MRN: [id number] DOB: 26-Oct-1945 Today's Date: 08/27/2021   History of Present Illness Pt is a 75 y/o female admitted 08/26/2021 for influenza treatment (including hypoxia) and evaluation of severe aortic stenosis PMH includes rheumatic MV/AV disease s/p mechanical MVR (on warfarin), moderate AR, mod-severe TR, PPM, hypothyroidism, aortic aneurysm, and GERD   Clinical Impression   Pt is typically independent in ADL and mobility. Enjoys road trips with her sisters and gardening. TOday she was initially min guard and progressed to supervision for LB and UB ADL, toilet transfer and peri care and extended sink level grooming.  Pt on 5L SpO2 via [place] when OT arrived. Dropped to 74% on RA, able to maintain >90% on 4L during ADL activity. Pt will benefit from OT in the acute setting with focus on energy conservation education and education on shower chair (vs 3 in 1) for safety while bathing.      Recommendations for follow up therapy are one component of a multi-disciplinary discharge planning process, led by the attending physician.  Recommendations may be updated based on patient status, additional functional criteria and insurance authorization.   Follow Up Recommendations  No OT follow up    Assistance Recommended at Discharge Intermittent Supervision/Assistance  Functional Status Assessment  Patient has had a recent decline in their functional status and demonstrates the ability to make significant improvements in function in a reasonable and predictable amount of time.  Equipment Recommendations  Tub/shower seat;BSC/3in1 (either/or - DME to be used as a shower seat)    Recommendations for Other Services PT consult     Precautions / Restrictions Precautions Precaution Comments: watch 02 Restrictions Weight Bearing Restrictions: No      Mobility Bed Mobility               General bed mobility comments:  sitting on EOB at beginning of session, and finished in recliner    Transfers Overall transfer level: Needs assistance Equipment used: None Transfers: Sit to/from Stand Sit to Stand: Min guard;Supervision           General transfer comment: progressed from min guard to supervision      Balance Overall balance assessment: Mild deficits observed, not formally tested                                         ADL either performed or assessed with clinical judgement   ADL Overall ADL's : Needs assistance/impaired Eating/Feeding: Independent   Grooming: Wash/dry hands;Wash/dry face;Oral care;Supervision/safety;Standing Grooming Details (indicate cue type and reason): sink level, assist to open toothpaste, no DOE, on 5L O2 via [place] able to maintain >90% for all activites - reports fatigue Upper Body Bathing: Supervision/ safety Upper Body Bathing Details (indicate cue type and reason): fatigues quickly, will benefit from BSC/shower chair Lower Body Bathing: Min guard;Sit to/from stand   Upper Body Dressing : Modified independent   Lower Body Dressing: Min guard Lower Body Dressing Details (indicate cue type and reason): able to don/doff socks Toilet Transfer: Min guard;Ambulation;Regular Teacher, adult education Details (indicate cue type and reason): occasionally reaching out for external support from environment Toileting- Architect and Hygiene: Supervision/safety;Sit to/from stand   Tub/ Shower Transfer: Min guard;Tub transfer (big garden tub with shower in it)   Functional mobility during ADLs: Min guard;Supervision/safety General ADL Comments: decreased activity tolerance     Vision  Baseline Vision/History: 1 Wears glasses Ability to See in Adequate Light: 0 Adequate Patient Visual Report: No change from baseline Vision Assessment?: No apparent visual deficits     Perception     Praxis      Pertinent Vitals/Pain Pain Assessment: No/denies  pain (hx of chronic back pain from previous fx)     Hand Dominance Right   Extremity/Trunk Assessment Upper Extremity Assessment Upper Extremity Assessment: Generalized weakness   Lower Extremity Assessment Lower Extremity Assessment: Defer to PT evaluation   Cervical / Trunk Assessment Cervical / Trunk Assessment: Kyphotic   Communication Communication Communication: No difficulties   Cognition Arousal/Alertness: Awake/alert Behavior During Therapy: WFL for tasks assessed/performed Overall Cognitive Status: Within Functional Limits for tasks assessed                                 General Comments: eager to please and prove she can do things, so moves quickly - but not impulsive, just determined to be independent     General Comments  Pt on 5L SpO2 via [place] when OT arrived. Dropped to 74% on RA, able to maintain >90% on 4L during ADL activity    Exercises     Shoulder Instructions      Home Living Family/patient expects to be discharged to:: Private residence Living Arrangements: Alone Available Help at Discharge: Family;Available PRN/intermittently Type of Home: Mobile home Home Access: Stairs to enter Entrance Stairs-Number of Steps: 5 Entrance Stairs-Rails: Right;Left;Can reach both Home Layout: One level     Bathroom Shower/Tub: Tub/shower unit (BIG GARDEN TUB)   Bathroom Toilet: Standard     Home Equipment: None   Additional Comments: low energy, often sleeps in recliner, used to enjoy gardening      Prior Functioning/Environment Prior Level of Function : Independent/Modified Independent             Mobility Comments: "sometimes I furniture surf" ADLs Comments: indpendent        OT Problem List: Decreased activity tolerance;Cardiopulmonary status limiting activity      OT Treatment/Interventions: Self-care/ADL training;Energy conservation;Patient/family education;DME and/or AE instruction    OT Goals(Current goals can be  found in the care plan section) Acute Rehab OT Goals Patient Stated Goal: Get back to gardening OT Goal Formulation: With patient Time For Goal Achievement: 09/10/21 Potential to Achieve Goals: Good  OT Frequency: Min 2X/week   Barriers to D/C:            Co-evaluation              AM-PAC OT "6 Clicks" Daily Activity     Outcome Measure Help from another person eating meals?: None Help from another person taking care of personal grooming?: None Help from another person toileting, which includes using toliet, bedpan, or urinal?: None Help from another person bathing (including washing, rinsing, drying)?: A Little Help from another person to put on and taking off regular upper body clothing?: None Help from another person to put on and taking off regular lower body clothing?: A Little 6 Click Score: 22   End of Session Equipment Utilized During Treatment: Gait belt;Oxygen (4-5L) Nurse Communication: Mobility status;Other (comment) (SpO2)  Activity Tolerance: Patient tolerated treatment well Patient left: in chair;with chair alarm set;with call bell/phone within reach  OT Visit Diagnosis: History of falling (Z91.81);Muscle weakness (generalized) (M62.81)                Time: 4098-1191 OT  Time Calculation (min): 34 min Charges:  OT General Charges $OT Visit: 1 Visit OT Evaluation $OT Eval Moderate Complexity: 1 Mod OT Treatments $Self Care/Home Management : 8-22 mins  Nyoka Cowden OTR/L Acute Rehabilitation Services Pager: [phone] Office: [phone]  Evern Bio Nastacia Raybuck 08/27/2021, 10:24 AM

## 2021-08-27 NOTE — Progress Notes (Addendum)
Progress Note  Patient Name: [name] [name] Date of Encounter: 08/27/2021  [facility] Cardiologist: has seen multiple cardiologists, now wants to follow with [facility], has seen Dr. Burt Knack for TAVR workup  Subjective   Chest discomfort overnight that worsened with nitro. She desats to the 70% with ambulation.  Inpatient Medications    Scheduled Meds:  atorvastatin  80 mg Oral Daily   diltiazem  120 mg Oral BID   feeding supplement  237 mL Oral BID BM   furosemide  40 mg Intravenous BID   levothyroxine  75 mcg Oral Daily   oseltamivir  30 mg Oral Daily   pantoprazole  40 mg Oral Daily   potassium chloride SA  40 mEq Oral Daily   sodium chloride flush  3 mL Intravenous Q12H   Continuous Infusions:  sodium chloride     PRN Meds: sodium chloride, acetaminophen **OR** acetaminophen, albuterol, ALPRAZolam, baclofen, benzonatate, nitroGLYCERIN, ondansetron **OR** ondansetron (ZOFRAN) IV, sodium chloride flush   Vital Signs    Vitals:   08/26/21 2345 08/27/21 0106 08/27/21 0338 08/27/21 0804  BP: (!) 112/58 126/61 (!) 95/47 (!) 97/55  Pulse: 78 72 66 63  Resp: (!) 23 20 20 20   Temp:  98.9 F (37.2 C) 98.9 F (37.2 C) 99.2 F (37.3 C)  TempSrc:  Oral Oral Oral  SpO2: 92% 93% 91% 97%  Weight:      Height:       No intake or output data in the 24 hours ending 08/27/21 0919 Last 3 Weights 08/26/2021 08/22/2021 03/14/2021  Weight (lbs) 120 lb 123 lb 119 lb  Weight (kg) 54.432 kg 55.792 kg 53.978 kg      Telemetry    Sinus brady in the 50s, episodes of A-V pacing - Personally Reviewed  ECG    No new tracings - Personally Reviewed  Physical Exam   GEN: mild respiratory distress Neck: + JVD Cardiac: regular rhythm regular rate, 4/6 systolic murmur  Respiratory: coarse, rhonchus GI: Soft, nontender, non-distended  MS: mild B LE edema with chronic skin changes Neuro:  Nonfocal  Psych: Normal affect   Labs    High Sensitivity Troponin:   Recent Labs  Lab  08/26/21 2149 08/27/21 0001  TROPONINIHS 41* 41*     Chemistry Recent Labs  Lab 08/22/21 1340 08/26/21 2149 08/27/21 0155  NA 143 130* 131*  K 3.8 3.2* 3.2*  CL 100 93* 94*  CO2 27 24 27   GLUCOSE 83 124* 117*  BUN 18 34* 32*  CREATININE 1.25* 1.40* 1.37*  CALCIUM 9.1 8.6* 8.2*  MG  --   --  1.9  GFRNONAA  --  39* 41*  ANIONGAP  --  13 10    Lipids No results for input(s): CHOL, TRIG, HDL, LABVLDL, LDLCALC, CHOLHDL in the last 168 hours.  Hematology Recent Labs  Lab 08/26/21 2149 08/27/21 0155  WBC 3.4* 2.9*  RBC 4.42 3.93  HGB 12.3 11.4*  HCT 39.8 35.2*  MCV 90.0 89.6  MCH 27.8 29.0  MCHC 30.9 32.4  RDW 15.8* 15.7*  PLT 111* 96*   Thyroid No results for input(s): TSH, FREET4 in the last 168 hours.  BNP Recent Labs  Lab 08/26/21 2149  BNP 1,295.2*    DDimer No results for input(s): DDIMER in the last 168 hours.   Radiology    DG Chest Port 1 View  Result Date: 08/26/2021 CLINICAL DATA:  Shortness of breath. EXAM: PORTABLE CHEST 1 VIEW COMPARISON:  Chest radiograph dated 07/21/2021.  FINDINGS: Cardiomegaly with vascular congestion. Bilateral mid to lower lung field densities may represent atelectasis, or vascular congestion. Developing infiltrate is not excluded clinical correlation recommended no pneumothorax. Median sternotomy wires and mechanical cardiac valve. Atherosclerotic calcification of the aorta. Left pectoral pacemaker device. No acute osseous pathology. IMPRESSION: Cardiomegaly with vascular congestion. Developing infiltrate is not excluded. Electronically Signed   By: Elgie Collard M.D.   On: 08/26/2021 23:11    Cardiac Studies   Echo 08/20/21: From OSH - report unavailable  Patient Profile     75 y.o. 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  Prior  cardiologists: Dr. Andrey Campanile [facility]) Dr. Tollie Pizza [facility])  Assessment & Plan    Severe aortic stenosis - undergoing TAVR workup   Hx of mechanical mitral valve Chronic coumadin therapy - INR 2.5-3.5 - INR at admission 3.8 - will need to follow - appreciate help from pharmacy   Acute on chronic systolic heart failure -LVEF 07/22/21 was estimated at 30-35%, in May 2022 LVEF was 25% - severe TR - BNP 1295 - CXR with vascular congestion - appeared euvolemic to cards fellow overnight - was started on 40 mg IV lasix BID by primary, no I&Os charted - GDMT at home includes candesartan and PRN lasix - she is on cardizem 240 mg BID, listed in home meds - she states she is intolerant to all BB - will keep cardizem for now - strict I&Os and daily weights - she states she has not urinated more with IV lasix   CKD - sCr 1.37- baseline may be 1.25 - CrCl 26.3 ml/min - follow with IV lasix   CAD with prior stenting - MI 20-30 years ago with stent in 2013 with BMS - L/R HC planned as part of TAVR workup - hs troponin 41 --> 41 - low suspicion for ACS process a the cause of her chest discomfrot   Atrial fibrillation RBBB Hx of SSS - on chronic coumadin - 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 Graciela Husbands) - hx of prior atrial flutter ablation with recurrent atrial arrhythmias, atrial tachycardia - she does not know who follows her PPM - I will message our device clinic to see if we can get recent downloads   Influenza A Acute respiratory failure - supportive care per primary -tamiflu     For questions or updates, please contact [facility] Please consult [email or web address] for contact info under        Signed, Marcelino Duster, PA  08/27/2021, 9:19 AM    History and all data above reviewed.  Patient seen earlier today by the covering cardiologist.  I agree with the findings as above.   All available labs, radiology testing, previous records reviewed. Agree with documented  assessment and plan. I have discontinued the Cardizem with the reduced EF.  I will talk to her further about the contraindication to beta blocker.  Currently she is in NSR and we will look at her device to understand the atrial fib burden.  I am not sure that amiodarone would be a good long term choice.  Will follow up in the AM.   Rollene Rotunda  7:17 PM  08/27/2021

## 2021-08-27 NOTE — Progress Notes (Signed)
TRH night cross cover note:  RN also conveyed patient's request for resumption of home prn baclofen in setting of chronic back pain.  There is currently a prn for Tylenol, but the patient conveys that, historically speaking, Tylenol has been ineffective in managing her chronic back pain, prompting her request for resumption of home prn baclofen.  Per chart review, it appears that the patient has a history of stage IIIa chronic kidney disease, with presenting serum creatinine of 1.4 noted to be stable relative to baseline.  in the setting of CKD and plan for interval IV diuresis , will be cautious with baclofen dosing, while closely monitoring ensuing trend in renal function.  Consequently, relative to her home dose of baclofen 20 mg p.o. 3 times daily as needed, I have ordered baclofen 10 mg p.o. twice daily as needed.     Newton Pigg, DO Hospitalist

## 2021-08-28 DIAGNOSIS — J101 Influenza due to other identified influenza virus with other respiratory manifestations: Secondary | ICD-10-CM | POA: Diagnosis not present

## 2021-08-28 DIAGNOSIS — J9601 Acute respiratory failure with hypoxia: Secondary | ICD-10-CM | POA: Diagnosis not present

## 2021-08-28 DIAGNOSIS — E440 Moderate protein-calorie malnutrition: Secondary | ICD-10-CM

## 2021-08-28 DIAGNOSIS — I5023 Acute on chronic systolic (congestive) heart failure: Secondary | ICD-10-CM | POA: Diagnosis not present

## 2021-08-28 DIAGNOSIS — Z7901 Long term (current) use of anticoagulants: Secondary | ICD-10-CM | POA: Diagnosis not present

## 2021-08-28 HISTORY — DX: Moderate protein-calorie malnutrition: E44.0

## 2021-08-28 LAB — RENAL FUNCTION PANEL
Albumin: 3.8 g/dL (ref 3.5–5.0)
Anion gap: 8 (ref 5–15)
BUN: 28 mg/dL — ABNORMAL HIGH (ref 8–23)
CO2: 31 mmol/L (ref 22–32)
Calcium: 8.5 mg/dL — ABNORMAL LOW (ref 8.9–10.3)
Chloride: 95 mmol/L — ABNORMAL LOW (ref 98–111)
Creatinine, Ser: 1.18 mg/dL — ABNORMAL HIGH (ref 0.44–1.00)
GFR, Estimated: 48 mL/min — ABNORMAL LOW (ref >60)
Glucose, Bld: 111 mg/dL — ABNORMAL HIGH (ref 70–99)
Phosphorus: 2.6 mg/dL (ref 2.5–4.6)
Potassium: 3.5 mmol/L (ref 3.5–5.1)
Sodium: 134 mmol/L — ABNORMAL LOW (ref 135–145)

## 2021-08-28 LAB — CBC
HCT: 37.8 % (ref 36.0–46.0)
Hemoglobin: 12 g/dL (ref 12.0–15.0)
MCH: 28.6 pg (ref 26.0–34.0)
MCHC: 31.7 g/dL (ref 30.0–36.0)
MCV: 90.2 fL (ref 80.0–100.0)
Platelets: 104 10*3/uL — ABNORMAL LOW (ref 150–400)
RBC: 4.19 MIL/uL (ref 3.87–5.11)
RDW: 16 % — ABNORMAL HIGH (ref 11.5–15.5)
WBC: 3.5 10*3/uL — ABNORMAL LOW (ref 4.0–10.5)
nRBC: 0 % (ref 0.0–0.2)

## 2021-08-28 LAB — PROTIME-INR
INR: 3.9 — ABNORMAL HIGH (ref 0.8–1.2)
Prothrombin Time: 38.4 seconds — ABNORMAL HIGH (ref 11.4–15.2)

## 2021-08-28 LAB — MAGNESIUM: Magnesium: 1.9 mg/dL (ref 1.7–2.4)

## 2021-08-28 MED ORDER — WARFARIN - PHARMACIST DOSING INPATIENT: Freq: Every day | Status: DC

## 2021-08-28 MED ORDER — POTASSIUM CHLORIDE CRYS ER 20 MEQ PO TBCR
40.0000 meq | EXTENDED_RELEASE_TABLET | Freq: Every day | ORAL | Status: DC
  Administered 2021-08-28 – 2021-08-29 (×2): 40 meq via ORAL
  Filled 2021-08-28 (×3): qty 2

## 2021-08-28 MED ORDER — POTASSIUM CHLORIDE CRYS ER 20 MEQ PO TBCR
40.0000 meq | EXTENDED_RELEASE_TABLET | Freq: Once | ORAL | Status: AC
  Administered 2021-08-28: 40 meq via ORAL
  Filled 2021-08-28: qty 2

## 2021-08-28 MED ORDER — GUAIFENESIN ER 600 MG PO TB12
600.0000 mg | ORAL_TABLET | Freq: Two times a day (BID) | ORAL | Status: DC
  Administered 2021-08-28: 600 mg via ORAL
  Filled 2021-08-28 (×5): qty 1

## 2021-08-28 MED ORDER — GUAIFENESIN-DM 100-10 MG/5ML PO SYRP
5.0000 mL | ORAL_SOLUTION | ORAL | Status: DC | PRN
  Administered 2021-08-28 – 2021-08-29 (×3): 5 mL via ORAL
  Filled 2021-08-28 (×3): qty 5

## 2021-08-28 MED ORDER — PHENOL 1.4 % MT LIQD: 1.0000 | OROMUCOSAL | Status: DC | PRN

## 2021-08-28 MED ORDER — FUROSEMIDE 10 MG/ML IJ SOLN
40.0000 mg | Freq: Two times a day (BID) | INTRAMUSCULAR | Status: DC
  Administered 2021-08-28 – 2021-08-30 (×2): 40 mg via INTRAVENOUS
  Filled 2021-08-28 (×3): qty 4

## 2021-08-28 NOTE — Progress Notes (Signed)
ANTICOAGULATION CONSULT NOTE - Follow Up Consult  Pharmacy Consult for Coumadin Indication: Mech MVR + afib,  Allergies  Allergen Reactions   Atenolol     REACTION: low pulse   Metoprolol Succinate [Metoprolol]     Patient Measurements: Height: 4\' 10"  (147.3 cm) Weight: 53.5 kg (117 lb 15.1 oz) IBW/kg (Calculated) : 40.9  Vital Signs: Temp: 97.7 F (36.5 C) (11/10 0829) Temp Source: Oral (11/10 0829) BP: 118/51 (11/10 0829) Pulse Rate: 62 (11/10 0424)  Labs: Recent Labs    08/26/21 2149 08/27/21 0001 08/27/21 0155 08/28/21 0109  HGB 12.3  --  11.4* 12.0  HCT 39.8  --  35.2* 37.8  PLT 111*  --  96* 104*  LABPROT 37.4*  --  38.6* 38.4*  INR 3.8*  --  4.0* 3.9*  CREATININE 1.40*  --  1.37* 1.18*  TROPONINIHS 41* 41*  --   --      Estimated Creatinine Clearance: 30.3 mL/min (A) (by C-G formula based on SCr of 1.18 mg/dL (H)).    Assessment: 75 y/o F with significant cardiac history presents to the ED with shortness of breath. She is under consideration for TAVR. 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  INR today remains supratherapeutic at 3.9 (down from 4 yesterday with held dose). Hgb 12, plt 104. No s/sx of bleeding.   PTA Coumadin 7mg /d, admit INR 3.8  Goal of Therapy:  INR 2.5-3.5 Monitor platelets by anticoagulation protocol: Yes   Plan:  No warfarin tonight to allow INR to drift down further Daily PT/INR  67, PharmD, BCCCP Clinical Pharmacist  Phone: [phone] 08/28/2021 9:40 AM  Please check AMION for all [facility] Pharmacy phone numbers After 10:00 PM, call Main Pharmacy [phone]

## 2021-08-28 NOTE — Progress Notes (Addendum)
Progress Note  Patient Name: [name] [name] Date of Encounter: 08/28/2021  Primary Cardiologist:   None   Subjective   She feels like she is "dry."  She thinks her breathing is better.  She is on 4l and at 97%.  No pain.   Inpatient Medications    Scheduled Meds:  atorvastatin  80 mg Oral Daily   feeding supplement  237 mL Oral BID BM   furosemide  40 mg Intravenous BID   guaiFENesin  600 mg Oral BID   levothyroxine  75 mcg Oral Daily   multivitamin with minerals  1 tablet Oral Daily   oseltamivir  30 mg Oral Daily   pantoprazole  40 mg Oral Daily   potassium chloride SA  40 mEq Oral Daily   sodium chloride flush  3 mL Intravenous Q12H   Warfarin - Pharmacist Dosing Inpatient   Does not apply q1600   Continuous Infusions:  sodium chloride     PRN Meds: sodium chloride, acetaminophen **OR** acetaminophen, albuterol, ALPRAZolam, baclofen, guaiFENesin-dextromethorphan, nitroGLYCERIN, ondansetron **OR** ondansetron (ZOFRAN) IV, phenol, sodium chloride flush   Vital Signs    Vitals:   08/27/21 2311 08/28/21 0424 08/28/21 0829 08/28/21 1138  BP: (!) 127/50 (!) 128/111 (!) 118/51 (!) 111/47  Pulse: 72 62  72  Resp: 18 18 17 15   Temp: 98.3 F (36.8 C) 98.6 F (37 C) 97.7 F (36.5 C) 97.8 F (36.6 C)  TempSrc: Oral Oral Oral Oral  SpO2: 94% 96%  97%  Weight:  53.5 kg    Height:        Intake/Output Summary (Last 24 hours) at 08/28/2021 1521 Last data filed at 08/28/2021 1412 Gross per 24 hour  Intake 236 ml  Output 1500 ml  Net -1264 ml   Filed Weights   08/26/21 2138 08/28/21 0424  Weight: 54.4 kg 53.5 kg    Telemetry    AV pacing demand - Personally Reviewed  ECG    NA - Personally Reviewed  Physical Exam   GEN: No acute distress.   Neck: No  JVD Cardiac: RRR, mechanical S2, 3/6 systolic murmur radiating out the outflow tract and mid to late peaking, no diastolic murmurs, rubs, or gallops.  Respiratory:     Decreased breath sounds with  coarse crackles GI: Soft, nontender, non-distended  MS: No  edema; No deformity. Neuro:  Nonfocal  Psych: Normal affect   Labs    Chemistry Recent Labs  Lab 08/26/21 2149 08/27/21 0155 08/28/21 0109  NA 130* 131* 134*  K 3.2* 3.2* 3.5  CL 93* 94* 95*  CO2 24 27 31   GLUCOSE 124* 117* 111*  BUN 34* 32* 28*  CREATININE 1.40* 1.37* 1.18*  CALCIUM 8.6* 8.2* 8.5*  ALBUMIN  --   --  3.8  GFRNONAA 39* 41* 48*  ANIONGAP 13 10 8      Hematology Recent Labs  Lab 08/26/21 2149 08/27/21 0155 08/28/21 0109  WBC 3.4* 2.9* 3.5*  RBC 4.42 3.93 4.19  HGB 12.3 11.4* 12.0  HCT 39.8 35.2* 37.8  MCV 90.0 89.6 90.2  MCH 27.8 29.0 28.6  MCHC 30.9 32.4 31.7  RDW 15.8* 15.7* 16.0*  PLT 111* 96* 104*    Cardiac EnzymesNo results for input(s): TROPONINI in the last 168 hours. No results for input(s): TROPIPOC in the last 168 hours.   BNP Recent Labs  Lab 08/26/21 2149  BNP 1,295.2*     DDimer No results for input(s): DDIMER in the last 168 hours.  Radiology    DG Chest Port 1 View  Result Date: 08/26/2021 CLINICAL DATA:  Shortness of breath. EXAM: PORTABLE CHEST 1 VIEW COMPARISON:  Chest radiograph dated 07/21/2021. FINDINGS: Cardiomegaly with vascular congestion. Bilateral mid to lower lung field densities may represent atelectasis, or vascular congestion. Developing infiltrate is not excluded clinical correlation recommended no pneumothorax. Median sternotomy wires and mechanical cardiac valve. Atherosclerotic calcification of the aorta. Left pectoral pacemaker device. No acute osseous pathology. IMPRESSION: Cardiomegaly with vascular congestion. Developing infiltrate is not excluded. Electronically Signed   By: Anner Crete M.D.   On: 08/26/2021 23:11    Cardiac Studies   NA  Patient Profile     75 y.o. female  75 y.o. 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   Prior cardiologists: Dr. Frankey Poot [facility]) Dr. Bishop [facility])   Assessment & Plan    SEVERE AS:  I will discuss with Dr. Burt Knack tomorrow the timing of TAVR.  Certainly she will need to be over the acute influenza event.   ACUTE ON CHRONIC SYSTOLIC HF:  0000000 EF 30 - 35%.   Net negative 2046 cc.  I would like to give Lasix    CKD:  Creatinine is OK.  Follow.  Switch back to PO Lasix in the morning.   CAD:  No evidence of active ischemia.     ATRIAL FIB:  I talked with her again and she reports that she is absolutely intolerant of beta blockers.  She will likely need to be back on her Cardizem when her BP allows.    MECHANICAL MV:  INR 3.9.  Warfarin per pharmcy.     For questions or updates, please contact [place] Please consult [email or web address] for contact info under Cardiology/STEMI.   Signed, Minus Breeding, MD  08/28/2021, 3:21 PM

## 2021-08-28 NOTE — Progress Notes (Signed)
SATURATION QUALIFICATIONS: (This note is used to comply with regulatory documentation for home oxygen)  Patient Saturations on Room Air at Rest = 93%  Patient Saturations on Room Air while Ambulating = 86%  Patient Saturations on 2 Liters of oxygen while Ambulating = 94%  Please briefly explain why patient needs home oxygen: Pt requires supplemental oxygen to maintain SpO2 >/ 88% with activity.  [name] [name], SPT

## 2021-08-28 NOTE — Evaluation (Signed)
Clinical/Bedside Swallow Evaluation Patient Details  Name: [name] [name] MRN: [id number] Date of Birth: [birth year]  Today's Date: 08/28/2021 Time: SLP Start Time (ACUTE ONLY): 1049 SLP Stop Time (ACUTE ONLY): 1112 SLP Time Calculation (min) (ACUTE ONLY): 23 min  Past Medical History:  Past Medical History:  Diagnosis Date   Anxiety    Aortic aneurysm (HCC)    Atrial arrhythmia    With prior history of atrial flutter ablation   Bradycardia    s/p pacemaker implantation   CONGESTIVE HEART FAILURE 07/31/2010   Qualifier: Diagnosis of  By: Graciela Husbands, MD, Susie Cassette    GERD (gastroesophageal reflux disease)    Hypertension    Hypothyroidism    Long Q-T syndrome    Myocardial infarct (HCC) 11/20/2011   Stent placement for 100% blockage of LAD   PACEMAKER, 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   Qualifier: Diagnosis of  By: Graciela Husbands, MD, Susie Cassette    Rheumatic heart disease    With mitral valve replacement, tricuspid regurgitation moderate to severe, significant left atrial enlargment   TRICUSPID REGURGITATION 12/23/2010   Qualifier: Diagnosis of  By: Graciela Husbands, MD, Susie Cassette    Unspecified essential hypertension 07/31/2010   Qualifier: Diagnosis of  By: Graciela Husbands, MD, [facility], Ty Hilts    Past Surgical History:  Past Surgical History:  Procedure Laterality Date   APPENDECTOMY     CARDIAC ELECTROPHYSIOLOGY STUDY AND ABLATION     Stent and pacemaker placement   CHOLECYSTECTOMY     CORONARY ANGIOPLASTY WITH STENT PLACEMENT Left 09/13/2015   Dr. Josefa Half at [facility], LAD stent   OOPHORECTOMY     PACEMAKER INSERTION  11/20/2007   Dual chamber pacemaker implantation with rapid ventricular pacing   SALPINGECTOMY Bilateral    TONSILLECTOMY     HPI:  Pt is a 75 y/o female admitted 08/26/2021 for influenza treatment (including hypoxia) and evaluation of severe aortic stenosis. SLP consulted due to pt's report to RD that "food gets stuck in her chest when eating". 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 Seen by SLP at [facility] in March, 2022 with normal oropharyngeal swallow function and symptoms of esophageal dysphagia.    Assessment / Plan / Recommendation  Clinical Impression  Pt was seen for bedside swallow evaluation. Pt provided inconsistent and variable reports of dysphagia. Pt initially reported that "for as long as (she) can remember she has been having the sensation of needles/a straight pin being stuck in her throat and stabbing her. She stated that it started on the right side, but that it is now bilateral. Per the pt, she cannot catch her breath when this occurs, and that this occurs when she gets hot, her mouth gets dry, or when she wakes up in the middle of the night. She also inconsistently reported that food/liquid goes "down my windpipe" and frequently stated and solids and liquids "get stuck" sometimes when she swallows. Pt identified the mid chest as the site of the location. Pt subsequently stated that she needs an enema, has a "tight butt" and that food gets stuck and won't go down/come out.   Oral mechanism exam was [facility] and she presented with full dentures. A single cough was noted during the evaluation, but this did not appear to correlate with trials, and her oropharyngeal swallow mechanism appeared to be [facility]. Pt's current diet of regular texture solids with thin liquids will be continued. Considering pt's reported symptoms, the  possibility of completing a modified barium swallow study to rule out aspiration was discussed. Pt's report of esophageal symptoms was also discussed as well as tests (e.g., esophagram) to further assess esophageal function. However, pt stated, "it's not so bad; I didn't have any problems since I've been here", and she expressed that she would rather not have her oropharyngeal/esophageal swallow mechanism evaluated further  during this admission. Pt stated that she will follow up with her PCP for an outpatient assessment if she subsequently desires. SLP will sign off.  SLP Visit Diagnosis: Dysphagia, unspecified (R13.10)    Aspiration Risk  Mild aspiration risk    Diet Recommendation Regular;Thin liquid   Liquid Administration via: Cup;Straw Medication Administration: Whole meds with puree Supervision: Patient able to self feed Compensations: Minimize environmental distractions Postural Changes: Seated upright at 90 degrees    Other  Recommendations Oral Care Recommendations: Oral care BID    Recommendations for follow up therapy are one component of a multi-disciplinary discharge planning process, led by the attending physician.  Recommendations may be updated based on patient status, additional functional criteria and insurance authorization.  Follow up Recommendations  (Outpatient MBS if pt desires)      Assistance Recommended at Discharge    Functional Status Assessment    Frequency and Duration            Prognosis Prognosis for Safe Diet Advancement: Good      Swallow Study   General Date of Onset: 08/27/21 HPI: Pt is a 75 y/o female admitted 08/26/2021 for influenza treatment (including hypoxia) and evaluation of severe aortic stenosis. SLP consulted due to pt's report to RD that "food gets stuck in her chest when eating". 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 Seen by SLP at [facility] in March, 2022 with normal oropharyngeal swallow function and symptoms of esophageal dysphagia. Type of Study: Bedside Swallow Evaluation Previous Swallow Assessment: none Diet Prior to this Study: Regular;Thin liquids Temperature Spikes Noted: No Respiratory Status: Nasal cannula History of Recent Intubation: No Behavior/Cognition: Alert;Cooperative;Pleasant mood Oral Cavity Assessment: Within  Functional Limits Oral Care Completed by SLP: No Oral Cavity - Dentition: Dentures, bottom;Dentures, top Vision: Functional for self-feeding Self-Feeding Abilities: Able to feed self Patient Positioning: Upright in bed;Postural control adequate for testing Baseline Vocal Quality: Normal Volitional Cough: Strong Volitional Swallow: Able to elicit    Oral/Motor/Sensory Function Overall Oral Motor/Sensory Function: Within functional limits   Ice Chips Ice chips: Within functional limits Presentation: Spoon   Thin Liquid Thin Liquid: Within functional limits Presentation: Straw    Nectar Thick Nectar Thick Liquid: Not tested   Honey Thick Honey Thick Liquid: Not tested   Puree Puree: Within functional limits Presentation: Spoon   Solid     Solid: Within functional limits Presentation: [place] [address]. Hardin Negus, [place], [place] Office number [phone] Pager [place] 08/28/2021,12:03 PM

## 2021-08-28 NOTE — Progress Notes (Signed)
Physical Therapy Treatment Patient Details Name: [name] [name] MRN: [id number] DOB: 11-Nov-1945 Today's Date: 08/28/2021   History of Present Illness Pt is a 75 y/o female admitted 08/26/2021 for influenza treatment (including hypoxia) and evaluation of severe aortic stenosis. 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    PT Comments    Pt is progressing well with mobility. Today's session focused on gait training and pt education. Ambulated in hall with supervision and several rest breaks due to fatigue and SOB. SpO2 down to 86% on RA during ambulation, returned to 90s on 2L. Discussed using a rollator at home to conserve energy but pt declined. Pt would benefit from further acute care PT services to continue to increase strength, mobility, and activity tolerance before returning home.   Recommendations for follow up therapy are one component of a multi-disciplinary discharge planning process, led by the attending physician.  Recommendations may be updated based on patient status, additional functional criteria and insurance authorization.  Follow Up Recommendations  No PT follow up     Assistance Recommended at Discharge Intermittent Supervision/Assistance  Equipment Recommendations  None recommended by PT    Recommendations for Other Services       Precautions / Restrictions Precautions Precautions: Other (comment);Fall Precaution Comments: watch 02 Restrictions Weight Bearing Restrictions: No     Mobility  Bed Mobility Overal bed mobility: Modified Independent             General bed mobility comments: supine to sit EOB with HOB elevated and use of bedrails, without assistance    Transfers Overall transfer level: Needs assistance Equipment used: None Transfers: Sit to/from Stand Sit to Stand: Supervision           General transfer comment: Able to  stand from EOB and BSC with no LOB    Ambulation/Gait Ambulation/Gait assistance: Supervision Gait Distance (Feet): 274 Feet Assistive device: None Gait Pattern/deviations: Step-through pattern;Decreased stride length;Narrow base of support Gait velocity: reduced     General Gait Details: Pt used 1 UE support on wall rail or furniture. Pt reported she felt much better using the wall rail when asked if she could walk without holding on. Several standing rest breaks, supervision for safety   Stairs             Wheelchair Mobility    Modified Rankin (Stroke Patients Only)       Balance Overall balance assessment: Needs assistance   Sitting balance-Leahy Scale: Normal Sitting balance - Comments: Able to sit EOB independently     Standing balance-Leahy Scale: Fair Standing balance comment: Pt able to static stand with no UE support; reaches for walls/furniture when ambulating                            Cognition Arousal/Alertness: Awake/alert Behavior During Therapy: WFL for tasks assessed/performed Overall Cognitive Status: Impaired/Different from baseline Area of Impairment: Attention;Memory                   Current Attention Level: Sustained Memory: Decreased short-term memory         General Comments: eager to parrtcipate, moves quickly, continually focused on "feeling dehydrated"        Exercises      General Comments General comments (skin integrity, edema, etc.): SpO2 down to 86% on RA; in 90s on 2L during activity and rest. Educated pt on  energy conservation and provided handout.      Pertinent Vitals/Pain Pain Assessment: No/denies pain    Home Living                          Prior Function            PT Goals (current goals can now be found in the care plan section) Acute Rehab PT Goals Patient Stated Goal: to improve PT Goal Formulation: With patient Time For Goal Achievement: 09/10/21 Potential to  Achieve Goals: Good Progress towards PT goals: Progressing toward goals    Frequency    Min 3X/week      PT Plan Current plan remains appropriate    Co-evaluation              AM-PAC PT "6 Clicks" Mobility   Outcome Measure  Help needed turning from your back to your side while in a flat bed without using bedrails?: None Help needed moving from lying on your back to sitting on the side of a flat bed without using bedrails?: A Little Help needed moving to and from a bed to a chair (including a wheelchair)?: A Little Help needed standing up from a chair using your arms (e.g., wheelchair or bedside chair)?: A Little Help needed to walk in hospital room?: A Little Help needed climbing 3-5 steps with a railing? : A Little 6 Click Score: 19    End of Session Equipment Utilized During Treatment: Oxygen Activity Tolerance: Patient tolerated treatment well Patient left: in chair;with nursing/sitter in room Nurse Communication: Mobility status PT Visit Diagnosis: Unsteadiness on feet (R26.81);Other abnormalities of gait and mobility (R26.89);Muscle weakness (generalized) (M62.81);Difficulty in walking, not elsewhere classified (R26.2)     Time: 6433-2951 PT Time Calculation (min) (ACUTE ONLY): 36 min  Charges:  $Gait Training: 8-22 mins $Therapeutic Exercise: 8-22 mins                     Daryel November, SPT    Daryel November 08/28/2021, 5:01 PM

## 2021-08-28 NOTE — Progress Notes (Signed)
PROGRESS NOTE  [name] [name] [id number] DOB: 1946/05/07   PCP: Marylen Ponto, MD  Patient is from: Home.  DOA: 08/26/2021 LOS: 2  Chief complaints:  Chief Complaint  Patient presents with   Shortness of Breath     Brief Narrative / Interim history: 75 year old F with PMH of systolic CHF, sev AVS undergoing eval for TAVR, sev TVR, rheumatic MVS s/p mech MVR on warfarin, CAD with prior stenting, A. fib, SSS/PPM, RBBB and CKD-3 presenting with chest pressure, shortness of breath, cough, congestion and fever, and admitted for acute hypoxic RF due to influenza A infection and acute on chronic systolic CHF.  Initially seen at PCP office and diagnosed with influenza A and sent home on Tamiflu but symptoms gotten worse and called EMS.    On admission, CXR with cardiomegaly and vascular congestion.  INR 3.8.  BNP 1295.  Troponin 41.  Started on IV Lasix and Tamiflu.  Cardiology consulted and following  Subjective: Seen and examined earlier this morning.  No major events overnight of this morning.  Continues to complain dry cough.  She says she "quit breathing".  She says she is winded easily.  She denies chest pain, GI or UTI symptoms.  Patient's sister at bedside.  Objective: Vitals:   08/27/21 2311 08/28/21 0424 08/28/21 0829 08/28/21 1138  BP: (!) 127/50 (!) 128/111 (!) 118/51 (!) 111/47  Pulse: 72 62  72  Resp: 18 18 17 15   Temp: 98.3 F (36.8 C) 98.6 F (37 C) 97.7 F (36.5 C) 97.8 F (36.6 C)  TempSrc: Oral Oral Oral Oral  SpO2: 94% 96%  97%  Weight:  53.5 kg    Height:        Intake/Output Summary (Last 24 hours) at 08/28/2021 1422 Last data filed at 08/28/2021 1412 Gross per 24 hour  Intake 236 ml  Output 2100 ml  Net -1864 ml   Filed Weights   08/26/21 2138 08/28/21 0424  Weight: 54.4 kg 53.5 kg    Examination:  GENERAL: No apparent distress.  Nontoxic. HEENT: MMM.  Vision and hearing grossly intact.  NECK: Supple.  No apparent JVD.  RESP: 96% on 4  L.  No IWOB.  Rhonchi bilaterally. CVS: RRR.  2/6 SEM over RUSB.  Mechanical heart sound over LLSB. ABD/GI/GU: BS+. Abd soft, NTND.  MSK/EXT:  Moves extremities. No apparent deformity.  Trace edema bilaterally. SKIN: no apparent skin lesion or wound NEURO: Awake and alert. Oriented appropriately.  No apparent focal neuro deficit. PSYCH: Calm. Normal affect.   Procedures:  None  Microbiology summarized: COVID-19 PCR negative. Influenza A PCR positive.  Assessment & Plan: Acute respiratory failure with hypoxia due to influenza A infection and CHF exacerbation: Desaturated to 83% on RA at rest.  Currently requiring 4 L to maintain saturation in the low 90s. -Continue Tamiflu and IV diuretics -Nebulizers, mucolytic's, antitussive IS, PT, OT and ambulatory saturation assessment -Requested RN to give IS to patient.   Influenza A infection: -Continue Tamiflu.  Acute on chronic systolic CHF/moderate PAH: TTE on 10/4 with LVEF of 30 to 35% (from 25% previously), RWMA, severe hypokinesis, sev LAE, critically severe AS,  mod AR, mod TR and mod PAH.  BNP elevated to 1300.  CXR with vascular congestion and cardiomegaly.  She has edema bilaterally.  Started on IV Lasix 40 mg twice daily.  I&O was not completely captured but she had 1.3 L UOP during the day yesterday.  Still with significant oxygen requirement and BLE edema. -Cardiology  following. -Continue IV Lasix 40 mg twice daily -Continue holding HCTZ and ARB given soft blood pressures. -Monitor fluid status, renal functions and electrolytes. -Sodium and fluid restriction   Elevated troponin/history of CAD/remote stenting-has chest pressure on presentation likely due to the above.  TTE as above.  Mildly elevated troponin at 41x2 suggests demand ischemia versus ACS. -Manage CHF and influenza as above.  Severe aortic stenosis -Undergoing evaluation for TAVR outpatient.  Moderate tricuspid valve regurgitation: She might be preload  dependent. 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 fib: Rate controlled.  On Cardizem and warfarin at home.  INR 3.9. -Continue Cardizem CD.  Has history of BB intolerance -Warfarin on hold in the setting of supratherapeutic INR -Pharmacy managing warfarin  Hyponatremia/hypokalemia-likely due to HCTZ.  Improved. -Discontinued HCTZ. -IV Lasix as above -P.o. KCl 40 mEq x 2  CKD-3A/azotemia: Renal function improved. Recent Labs    08/22/21 1340 08/26/21 2149 08/27/21 0155 08/28/21 0109  BUN 18 34* 32* 28*  CREATININE 1.25* 1.40* 1.37* 1.18*  -Continue monitoring  History of SSS/PPM -Per cardiology.  Pancytopenia: likely reactive process.  Improving. -Continue monitoring   Physical deconditioning -PT/OT eval  Moderate malnutrition Body mass index is 24.65 kg/m. Nutrition Problem: Moderate Malnutrition Etiology: chronic illness (CHF and CKD3) Signs/Symptoms: mild fat depletion, moderate muscle depletion Interventions: Ensure Enlive (each supplement provides 350kcal and 20 grams of protein)   DVT prophylaxis:  Patient with supratherapeutic INR on warfarin.  Code Status: Full code Family Communication: Updated patient's sister at bedside. Level of care: Telemetry Cardiac Status is: Inpatient  Remains inpatient appropriate because: Acute hypoxic respiratory failure, acute on chronic systolic CHF requiring IV Lasix    Consultants:  Cardiology   Sch Meds:  Scheduled Meds:  atorvastatin  80 mg Oral Daily   feeding supplement  237 mL Oral BID BM   guaiFENesin  600 mg Oral BID   levothyroxine  75 mcg Oral Daily   multivitamin with minerals  1 tablet Oral Daily   oseltamivir  30 mg Oral Daily   pantoprazole  40 mg Oral  Daily   potassium chloride SA  40 mEq Oral Daily   sodium chloride flush  3 mL Intravenous Q12H   Warfarin - Pharmacist Dosing Inpatient   Does not apply q1600   Continuous Infusions:  sodium chloride     PRN Meds:.sodium chloride, acetaminophen **OR** acetaminophen, albuterol, ALPRAZolam, baclofen, guaiFENesin-dextromethorphan, nitroGLYCERIN, ondansetron **OR** ondansetron (ZOFRAN) IV, phenol, sodium chloride flush  Antimicrobials: Anti-infectives (From admission, onward)    Start     Dose/Rate Route Frequency Ordered Stop   08/27/21 0200  oseltamivir (TAMIFLU) capsule 30 mg       Note to Pharmacy: Tamiflu 30 mg daily for CrCl <  30 mL/min   30 mg Oral Daily 08/27/21 0105 09/01/21 0959        I have personally reviewed the following labs and images: CBC: Recent Labs  Lab 08/26/21 2149 08/27/21 0155 08/28/21 0109  WBC 3.4* 2.9* 3.5*  NEUTROABS 2.5  --   --   HGB 12.3 11.4* 12.0  HCT 39.8 35.2* 37.8  MCV 90.0 89.6 90.2  PLT 111* 96* 104*   BMP &GFR Recent Labs  Lab 08/22/21 1340 08/26/21 2149 08/27/21 0155 08/28/21 0109  NA 143 130* 131* 134*  K 3.8 3.2*  3.2* 3.5  CL 100 93* 94* 95*  CO2 27 24 27 31   GLUCOSE 83 124* 117* 111*  BUN 18 34* 32* 28*  CREATININE 1.25* 1.40* 1.37* 1.18*  CALCIUM 9.1 8.6* 8.2* 8.5*  MG  --   --  1.9 1.9  PHOS  --   --   --  2.6   Estimated Creatinine Clearance: 30.3 mL/min (A) (by C-G formula based on SCr of 1.18 mg/dL (H)). Liver & Pancreas: Recent Labs  Lab 08/28/21 0109  ALBUMIN 3.8   No results for input(s): LIPASE, AMYLASE in the last 168 hours. No results for input(s): AMMONIA in the last 168 hours. Diabetic: No results for input(s): HGBA1C in the last 72 hours. No results for input(s): GLUCAP in the last 168 hours. Cardiac Enzymes: No results for input(s): CKTOTAL, CKMB, CKMBINDEX, TROPONINI in the last 168 hours. No results for input(s): PROBNP in the last 8760 hours. Coagulation Profile: Recent Labs  Lab  08/26/21 2149 08/27/21 0155 08/28/21 0109  INR 3.8* 4.0* 3.9*   Thyroid Function Tests: No results for input(s): TSH, T4TOTAL, FREET4, T3FREE, THYROIDAB in the last 72 hours. Lipid Profile: No results for input(s): CHOL, HDL, LDLCALC, TRIG, CHOLHDL, LDLDIRECT in the last 72 hours. Anemia Panel: No results for input(s): VITAMINB12, FOLATE, FERRITIN, TIBC, IRON, RETICCTPCT in the last 72 hours. Urine analysis: No results found for: COLORURINE, APPEARANCEUR, LABSPEC, PHURINE, GLUCOSEU, HGBUR, BILIRUBINUR, KETONESUR, PROTEINUR, UROBILINOGEN, NITRITE, LEUKOCYTESUR Sepsis Labs: Invalid input(s): PROCALCITONIN, [place]  Microbiology: Recent Results (from the past 240 hour(s))  Resp Panel by RT-PCR (Flu A&B, Covid) Nasopharyngeal Swab     Status: Abnormal   Collection Time: 08/26/21  9:49 PM   Specimen: Nasopharyngeal Swab; Nasopharyngeal(NP) swabs in vial transport medium  Result Value Ref Range Status   SARS Coronavirus 2 by RT PCR NEGATIVE NEGATIVE Final    Comment: (NOTE) SARS-CoV-2 target nucleic acids are NOT DETECTED.  The SARS-CoV-2 RNA is generally detectable in upper respiratory specimens during the acute phase of infection. The lowest concentration of SARS-CoV-2 viral copies this assay can detect is 138 copies/mL. A negative result does not preclude SARS-Cov-2 infection and should not be used as the sole basis for treatment or other patient management decisions. A negative result may occur with  improper specimen collection/handling, submission of specimen other than nasopharyngeal swab, presence of viral mutation(s) within the areas targeted by this assay, and inadequate number of viral copies(<138 copies/mL). A negative result must be combined with clinical observations, patient history, and epidemiological information. The expected result is Negative.  Fact Sheet for Patients:  EntrepreneurPulse.com.au  Fact Sheet for Healthcare Providers:   IncredibleEmployment.be  This test is no t yet approved or cleared by the [place] FDA and  has been authorized for detection and/or diagnosis of SARS-CoV-2 by FDA under an Emergency Use Authorization (EUA). This EUA will remain  in effect (meaning this test can be used) for the duration of the COVID-19 declaration under Section 564(b)(1) of the Act, 21 U.S.C.section 360bbb-3(b)(1), unless the authorization is terminated  or revoked sooner.       Influenza A by PCR POSITIVE (A) NEGATIVE Final   Influenza B by PCR NEGATIVE NEGATIVE Final    Comment: (NOTE) The Xpert Xpress SARS-CoV-2/FLU/RSV plus assay is intended as an aid in the diagnosis of influenza from Nasopharyngeal swab specimens and should not be used as a sole basis for treatment. Nasal washings and aspirates are unacceptable for Xpert Xpress SARS-CoV-2/FLU/RSV testing.  Fact Sheet for Patients: EntrepreneurPulse.com.au  Fact Sheet for Healthcare Providers: IncredibleEmployment.be  This test is not yet approved or cleared by the [place] FDA and has been authorized for detection and/or diagnosis of SARS-CoV-2 by FDA under an Emergency Use Authorization (EUA). This EUA will remain in effect (meaning this test can be used) for the duration of the COVID-19 declaration under Section 564(b)(1) of the Act, 21 U.S.C. section 360bbb-3(b)(1), unless the authorization is terminated or revoked.  Performed at [facility], [place] [address], [place], [place] [address]     Radiology Studies: No results found.    Ridgely Anastacio T. Garland  If 7PM-7AM, please contact night-coverage [email or web address] 08/28/2021, 2:22 PM

## 2021-08-29 DIAGNOSIS — J101 Influenza due to other identified influenza virus with other respiratory manifestations: Secondary | ICD-10-CM | POA: Diagnosis not present

## 2021-08-29 DIAGNOSIS — R451 Restlessness and agitation: Secondary | ICD-10-CM

## 2021-08-29 DIAGNOSIS — J9601 Acute respiratory failure with hypoxia: Secondary | ICD-10-CM | POA: Diagnosis not present

## 2021-08-29 DIAGNOSIS — I5023 Acute on chronic systolic (congestive) heart failure: Secondary | ICD-10-CM | POA: Diagnosis not present

## 2021-08-29 DIAGNOSIS — Z7901 Long term (current) use of anticoagulants: Secondary | ICD-10-CM | POA: Diagnosis not present

## 2021-08-29 LAB — RENAL FUNCTION PANEL
Albumin: 3.4 g/dL — ABNORMAL LOW (ref 3.5–5.0)
Anion gap: 11 (ref 5–15)
BUN: 20 mg/dL (ref 8–23)
CO2: 27 mmol/L (ref 22–32)
Calcium: 8.5 mg/dL — ABNORMAL LOW (ref 8.9–10.3)
Chloride: 94 mmol/L — ABNORMAL LOW (ref 98–111)
Creatinine, Ser: 0.94 mg/dL (ref 0.44–1.00)
GFR, Estimated: >60 mL/min (ref >60)
Glucose, Bld: 89 mg/dL (ref 70–99)
Phosphorus: 2.8 mg/dL (ref 2.5–4.6)
Potassium: 4.2 mmol/L (ref 3.5–5.1)
Sodium: 132 mmol/L — ABNORMAL LOW (ref 135–145)

## 2021-08-29 LAB — CBC
HCT: 37.4 % (ref 36.0–46.0)
Hemoglobin: 12.2 g/dL (ref 12.0–15.0)
MCH: 29 pg (ref 26.0–34.0)
MCHC: 32.6 g/dL (ref 30.0–36.0)
MCV: 89 fL (ref 80.0–100.0)
Platelets: 87 10*3/uL — ABNORMAL LOW (ref 150–400)
RBC: 4.2 MIL/uL (ref 3.87–5.11)
RDW: 15.8 % — ABNORMAL HIGH (ref 11.5–15.5)
WBC: 2.8 10*3/uL — ABNORMAL LOW (ref 4.0–10.5)
nRBC: 0 % (ref 0.0–0.2)

## 2021-08-29 LAB — PROTIME-INR
INR: 2 — ABNORMAL HIGH (ref 0.8–1.2)
Prothrombin Time: 23 seconds — ABNORMAL HIGH (ref 11.4–15.2)

## 2021-08-29 LAB — MAGNESIUM: Magnesium: 2.1 mg/dL (ref 1.7–2.4)

## 2021-08-29 MED ORDER — ZOLPIDEM TARTRATE 5 MG PO TABS
5.0000 mg | ORAL_TABLET | Freq: Every day | ORAL | Status: DC
  Administered 2021-08-29: 5 mg via ORAL
  Filled 2021-08-29: qty 1

## 2021-08-29 MED ORDER — WARFARIN SODIUM 10 MG PO TABS
10.0000 mg | ORAL_TABLET | Freq: Once | ORAL | Status: AC
  Administered 2021-08-29: 10 mg via ORAL
  Filled 2021-08-29: qty 1

## 2021-08-29 MED ORDER — ENOXAPARIN SODIUM 60 MG/0.6ML IJ SOSY
1.0000 mg/kg | PREFILLED_SYRINGE | Freq: Once | INTRAMUSCULAR | Status: DC
  Filled 2021-08-29: qty 0.6

## 2021-08-29 NOTE — Progress Notes (Signed)
Patient is refusing her morning labs, stating "I will wait until my doctor gets here." I explained to the patient the need for lab work to evaluate the treatment plan. Will continue to monitor and encourage the patient to cooperate with her treatment plan.

## 2021-08-29 NOTE — Progress Notes (Signed)
ANTICOAGULATION CONSULT NOTE - Follow Up Consult  Pharmacy Consult for Coumadin Indication: Mech MVR + afib,  Allergies  Allergen Reactions   Atenolol     REACTION: low pulse   Metoprolol Succinate [Metoprolol]     Patient Measurements: Height: 4\' 10"  (147.3 cm) Weight: 53.5 kg (117 lb 15.1 oz) IBW/kg (Calculated) : 40.9  Vital Signs: Temp: 98.8 F (37.1 C) (11/11 0848) Temp Source: Oral (11/11 0848) BP: 122/47 (11/11 0848) Pulse Rate: 64 (11/11 0848)  Labs: Recent Labs    08/26/21 2149 08/27/21 0001 08/27/21 0155 08/28/21 0109  HGB 12.3  --  11.4* 12.0  HCT 39.8  --  35.2* 37.8  PLT 111*  --  96* 104*  LABPROT 37.4*  --  38.6* 38.4*  INR 3.8*  --  4.0* 3.9*  CREATININE 1.40*  --  1.37* 1.18*  TROPONINIHS 41* 41*  --   --      Estimated Creatinine Clearance: 30.3 mL/min (A) (by C-G formula based on SCr of 1.18 mg/dL (H)).    Assessment: 75 y/o F with significant cardiac history presents to the ED with shortness of breath. She is under consideration for TAVR. 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  Patient refused all labs 11/11 am.  Since, INR yesterday was supratherapeutic at 3.9, it is not likely that she is subtherapeutic - will hold off on doing for today.   PTA Coumadin 7mg /d, admit INR 3.8  Goal of Therapy:  INR 2.5-3.5 Monitor platelets by anticoagulation protocol: Yes   Plan:  No warfarin tonight Daily PT/INR  13/11, PharmD, [facility] Clinical Pharmacist Please see AMION for all Pharmacists' Contact Phone Numbers 08/29/2021, 10:43 AM

## 2021-08-29 NOTE — Progress Notes (Signed)
SATURATION QUALIFICATIONS: (This note is used to comply with regulatory documentation for home oxygen)  Patient Saturations on Room Air at Rest = 90%  Patient Saturations on Room Air while Ambulating = 85%  Patient Saturations on 2 Liters of oxygen while Ambulating = 96%  Please briefly explain why patient needs home oxygen: Pt requires supplemental oxygen to maintain SpO2 >/88% with activity.  Ina Homes, PT, DPT Acute Rehabilitation Services  Pager [phone] Office [phone]

## 2021-08-29 NOTE — Progress Notes (Signed)
PROGRESS NOTE  [name] [name] [id number] DOB: 1945/11/13   PCP: Ronita Hipps, MD  Patient is from: Home.  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 fib, SSS/PPM, RBBB and CKD-3 presenting with chest pressure, shortness of breath, cough, congestion and fever, and admitted for acute hypoxic RF due to influenza A infection and acute on chronic systolic CHF.  Initially seen at PCP office and diagnosed with influenza A and sent home on Tamiflu but symptoms gotten worse and called EMS.    On admission, CXR with cardiomegaly and vascular congestion.  INR 3.8.  BNP 1295.  Troponin 41.  Started on IV Lasix and Tamiflu.  Cardiology consulted and following  Subjective: Seen and examined earlier this morning and this afternoon.  Patient was upset and agitated earlier this morning.  She refused care including lab draws, medications... She is upset but not specific.  Seems to be concerned about her her kidney although her kidney function seems to be improving.   I called and talked to patient's daughter, Patrina Levering at [phone].  Daughter apologizes and states that she did the same when she was at [facility] and had to take her home hourly.  She is also concerned about the Ativan she has been taking.  Lattie Haw will be coming to talk to patient, and see if that calms her down to continue her care.  1:20 PM-went to check on patient.  Seems to be calmer.  No complaints.  She denies chest pain but likes to have breathing treatments.  She says she refused IV Lasix because she feels dehydrated and her urine is dark.  I have talked her about the importance of lab draws including INR, renal function and electrolytes while on IV Lasix.  She is now in agreement.  I  have ordered the labs.  Notified patient's RN.  Objective: Vitals:   08/28/21 2350 08/29/21 0403 08/29/21 0848 08/29/21 1042  BP: (!) 104/53 (!) 117/52 (!) 122/47 (!) 129/52  Pulse: 68 62 64 77  Resp: 18 18 16 17   Temp: 98.6 F (37 C) 98.7 F (37.1 C) 98.8 F (37.1 C) 98.3 F (36.8 C)  TempSrc: Oral Oral Oral Oral  SpO2: 98% 95% 95% 94%  Weight:      Height:        Intake/Output Summary (Last 24 hours) at 08/29/2021 1347 Last data filed at 08/29/2021 0406 Gross per 24 hour  Intake --  Output 950 ml  Net -950 ml   Filed Weights   08/26/21 2138 08/28/21 0424  Weight: 54.4 kg 53.5 kg    Examination:  GENERAL: No apparent distress.  Nontoxic. HEENT: MMM.  Vision and hearing grossly intact.  NECK: Supple.  No apparent JVD.  RESP: 94% on 2 L.  No IWOB.  Fair aeration bilaterally. CVS: RRR.  2/6 SEM over RUSB.  Mechanical heart sound over LLSB.  Heart sounds normal.  ABD/GI/GU: BS+. Abd soft, NTND.  MSK/EXT:  Moves extremities. No apparent deformity.  Trace BLE edema. SKIN: no apparent skin lesion or wound NEURO: Awake and alert. Oriented appropriately.  No apparent focal neuro deficit. PSYCH: Patient was angry and somewhat paranoid earlier this morning but calmer this afternoon.  Procedures:  None  Microbiology summarized: COVID-19 PCR negative. Influenza A PCR positive.  Assessment & Plan: Acute respiratory failure with hypoxia due to influenza A infection and CHF exacerbation: Improved but desaturated to 85% with ambulation on room air. -Continue Tamiflu and IV diuretics -Nebulizers, mucolytic's, antitussive IS, PT, OT and ambulatory saturation assessment -Requested RN to give IS to patient.   Influenza A infection: -Continue Tamiflu.  Acute on chronic systolic CHF/moderate PAH: TTE on 10/4 with LVEF of 30 to 35% (from 25% previously), RWMA, severe hypokinesis, sev LAE, critically severe AS,  mod AR, mod TR and mod PAH.  BNP elevated to 1300.  CXR with vascular  congestion and cardiomegaly.  Still with BLE edema and oxygen requirement.  About 2 L UOP/24 hours.  Net -2.7 L.  Agitated and refusing meds and labs this morning but agreed to lab draws later this afternoon. -Cardiology following -Continue IV Lasix 40 mg twice daily -Continue holding HCTZ and ARB given soft blood pressures. -Monitor fluid status, renal functions and electrolytes. -Sodium and fluid restriction   Elevated troponin/history of CAD/remote stenting-has chest pressure on presentation likely due to the above.  TTE as above.  Mildly elevated troponin at 41x2 suggests demand ischemia versus ACS. -Manage CHF and influenza as above.  Severe aortic stenosis -Undergoing evaluation for TAVR outpatient.  Agitation/delirium/paranoia: Patient was angry and refused care including medications and lab draws this morning.  Talk to patient's daughter who reports similar incident while she was at [facility].  Patient's daughter is concerned about patient Xanax and Ambien she takes at home.  Patient refuses to consider Seroquel. -Continue home Xanax -Resume home Ambien at lower dose. -Reorientation and delirium precautions  Moderate tricuspid valve regurgitation: She might be preload dependent. -Cautious with cardiac meds and diuretics   Hypotension/history of essential hypertension: Normotensive for most part. 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 fib: Rate controlled.  On Cardizem and warfarin at home.  INR 3.9. -Has history of BB intolerance -Warfarin on hold in the setting of supratherapeutic INR -Pharmacy managing warfarin  Hyponatremia/hypokalemia-likely due to HCTZ.  Replenished and improved.  Patient refused labs this morning -Discontinued HCTZ. -Recheck labs this afternoon.  CKD-3A/azotemia: Renal function improving. Recent Labs     08/22/21 1340 08/26/21 2149 08/27/21 0155 08/28/21 0109  BUN 18 34* 32* 28*  CREATININE 1.25* 1.40* 1.37* 1.18*  -Continue monitoring  History of SSS/PPM -Per cardiology.  Pancytopenia: likely reactive process.  Improving. -Continue monitoring   Physical deconditioning -PT/OT eval  Moderate malnutrition Body mass index is 24.65 kg/m. Nutrition Problem: Moderate Malnutrition Etiology: chronic illness (CHF and CKD3) Signs/Symptoms: mild fat depletion, moderate muscle depletion Interventions: Ensure Enlive (each supplement provides 350kcal and 20 grams of protein)   DVT prophylaxis:  Patient with supratherapeutic INR on warfarin.  Code Status: Full code Family Communication: Updated patient's daughter over the phone. Level of care: Telemetry Cardiac Status is: Inpatient  Remains inpatient appropriate because: Acute hypoxic respiratory failure, acute on chronic systolic CHF requiring IV Lasix and delirium    Consultants:  Cardiology   Sch Meds:  Scheduled Meds:  atorvastatin  80 mg Oral Daily   feeding supplement  237 mL Oral BID BM   furosemide  40 mg Intravenous BID   guaiFENesin  600 mg Oral BID   levothyroxine  75 mcg Oral Daily   multivitamin with minerals  1 tablet Oral Daily   oseltamivir  30 mg Oral Daily   pantoprazole  40 mg Oral Daily   potassium chloride SA  40 mEq Oral Daily   sodium chloride flush  3 mL Intravenous Q12H   Warfarin - Pharmacist Dosing Inpatient   Does not apply q1600   Continuous Infusions:  sodium chloride     PRN Meds:.sodium chloride, acetaminophen **OR** acetaminophen, albuterol, ALPRAZolam, baclofen, guaiFENesin-dextromethorphan, nitroGLYCERIN, ondansetron **OR** ondansetron (ZOFRAN) IV, phenol, sodium chloride flush  Antimicrobials: Anti-infectives (From admission, onward)    Start     Dose/Rate Route Frequency Ordered Stop   08/27/21 0200  oseltamivir (TAMIFLU) capsule 30 mg       Note to Pharmacy: Tamiflu 30 mg daily  for CrCl <  30 mL/min   30 mg Oral Daily 08/27/21 0105 09/01/21 0959        I have personally reviewed the following labs and images: CBC: Recent Labs  Lab 08/26/21 2149 08/27/21 0155 08/28/21 0109  WBC 3.4* 2.9* 3.5*  NEUTROABS 2.5  --   --   HGB 12.3 11.4* 12.0  HCT 39.8 35.2* 37.8  MCV 90.0 89.6 90.2  PLT 111* 96* 104*   BMP &GFR Recent Labs  Lab 08/26/21 2149 08/27/21 0155 08/28/21 0109  NA 130* 131* 134*  K 3.2* 3.2* 3.5  CL 93* 94* 95*  CO2 24 27 31   GLUCOSE 124* 117* 111*  BUN 34* 32* 28*  CREATININE 1.40* 1.37* 1.18*  CALCIUM 8.6* 8.2* 8.5*  MG  --  1.9 1.9  PHOS  --   --  2.6   Estimated Creatinine Clearance: 30.3 mL/min (A) (by C-G formula based on SCr of 1.18 mg/dL (H)). Liver & Pancreas: Recent Labs  Lab 08/28/21 0109  ALBUMIN 3.8   No results for input(s): LIPASE, AMYLASE in the last 168 hours. No results for input(s): AMMONIA in the last 168 hours. Diabetic: No results for input(s): HGBA1C in the last 72 hours. No results for input(s): GLUCAP in the last 168 hours. Cardiac Enzymes: No results for input(s): CKTOTAL, CKMB, CKMBINDEX, TROPONINI in the last 168 hours. No results for input(s): PROBNP in the last 8760 hours. Coagulation Profile: Recent Labs  Lab 08/26/21 2149 08/27/21 0155 08/28/21 0109  INR 3.8* 4.0* 3.9*   Thyroid Function Tests: No results for input(s): TSH, T4TOTAL, FREET4, T3FREE, THYROIDAB in the last 72 hours. Lipid Profile: No results for input(s): CHOL, HDL, LDLCALC, TRIG, CHOLHDL, LDLDIRECT in the last 72 hours. Anemia Panel: No results for input(s): VITAMINB12, FOLATE, FERRITIN, TIBC, IRON, RETICCTPCT in the last 72 hours. Urine analysis: No results found for: COLORURINE, APPEARANCEUR, LABSPEC, PHURINE, GLUCOSEU, HGBUR, BILIRUBINUR, KETONESUR, PROTEINUR, UROBILINOGEN, NITRITE, LEUKOCYTESUR Sepsis Labs: Invalid input(s): PROCALCITONIN, [place]  Microbiology: Recent Results (from the past 240 hour(s))   Resp Panel by RT-PCR (Flu A&B, Covid) Nasopharyngeal Swab     Status: Abnormal   Collection Time: 08/26/21  9:49 PM   Specimen: Nasopharyngeal Swab; Nasopharyngeal(NP) swabs in vial transport medium  Result Value Ref Range Status   SARS Coronavirus 2 by RT PCR NEGATIVE NEGATIVE Final    Comment: (NOTE) SARS-CoV-2 target nucleic acids are NOT DETECTED.  The SARS-CoV-2 RNA is generally detectable in upper respiratory specimens during the acute phase of infection. The lowest concentration of SARS-CoV-2 viral copies this assay can detect is 138 copies/mL. A negative result does not preclude SARS-Cov-2 infection and should not be used as the sole basis for treatment or other patient management decisions. A negative result may occur with  improper specimen collection/handling, submission of  specimen other than nasopharyngeal swab, presence of viral mutation(s) within the areas targeted by this assay, and inadequate number of viral copies(<138 copies/mL). A negative result must be combined with clinical observations, patient history, and epidemiological information. The expected result is Negative.  Fact Sheet for Patients:  EntrepreneurPulse.com.au  Fact Sheet for Healthcare Providers:  IncredibleEmployment.be  This test is no t yet approved or cleared by the [place] FDA and  has been authorized for detection and/or diagnosis of SARS-CoV-2 by FDA under an Emergency Use Authorization (EUA). This EUA will remain  in effect (meaning this test can be used) for the duration of the COVID-19 declaration under Section 564(b)(1) of the Act, 21 U.S.C.section 360bbb-3(b)(1), unless the authorization is terminated  or revoked sooner.       Influenza A by PCR POSITIVE (A) NEGATIVE Final   Influenza B by PCR NEGATIVE NEGATIVE Final    Comment: (NOTE) The Xpert Xpress SARS-CoV-2/FLU/RSV plus assay is intended as an aid in the diagnosis of influenza from  Nasopharyngeal swab specimens and should not be used as a sole basis for treatment. Nasal washings and aspirates are unacceptable for Xpert Xpress SARS-CoV-2/FLU/RSV testing.  Fact Sheet for Patients: EntrepreneurPulse.com.au  Fact Sheet for Healthcare Providers: IncredibleEmployment.be  This test is not yet approved or cleared by the [place] FDA and has been authorized for detection and/or diagnosis of SARS-CoV-2 by FDA under an Emergency Use Authorization (EUA). This EUA will remain in effect (meaning this test can be used) for the duration of the COVID-19 declaration under Section 564(b)(1) of the Act, 21 U.S.C. section 360bbb-3(b)(1), unless the authorization is terminated or revoked.  Performed at [facility], [place] [address] [place], [place] [address]     Radiology Studies: No results found.    Faraz Ponciano T. [place]  If 7PM-7AM, please contact night-coverage [email or web address] 08/29/2021, 1:47 PM

## 2021-08-29 NOTE — Progress Notes (Signed)
Progress Note  Patient Name: [name] [name] Date of Encounter: 08/29/2021  Primary Cardiologist:   None   Subjective   Ambulated in the hallway with 2 liters.  Did not want to have any further Lasix.  Denies pain.  She thinks her breathing is fine  Inpatient Medications    Scheduled Meds:  atorvastatin  80 mg Oral Daily   feeding supplement  237 mL Oral BID BM   furosemide  40 mg Intravenous BID   guaiFENesin  600 mg Oral BID   levothyroxine  75 mcg Oral Daily   multivitamin with minerals  1 tablet Oral Daily   oseltamivir  30 mg Oral Daily   pantoprazole  40 mg Oral Daily   potassium chloride SA  40 mEq Oral Daily   sodium chloride flush  3 mL Intravenous Q12H   Warfarin - Pharmacist Dosing Inpatient   Does not apply q1600   zolpidem  5 mg Oral QHS   Continuous Infusions:  sodium chloride     PRN Meds: sodium chloride, acetaminophen **OR** acetaminophen, albuterol, ALPRAZolam, baclofen, guaiFENesin-dextromethorphan, nitroGLYCERIN, ondansetron **OR** ondansetron (ZOFRAN) IV, phenol, sodium chloride flush   Vital Signs    Vitals:   08/28/21 2350 08/29/21 0403 08/29/21 0848 08/29/21 1042  BP: (!) 104/53 (!) 117/52 (!) 122/47 (!) 129/52  Pulse: 68 62 64 77  Resp: 18 18 16 17   Temp: 98.6 F (37 C) 98.7 F (37.1 C) 98.8 F (37.1 C) 98.3 F (36.8 C)  TempSrc: Oral Oral Oral Oral  SpO2: 98% 95% 95% 94%  Weight:      Height:        Intake/Output Summary (Last 24 hours) at 08/29/2021 1416 Last data filed at 08/29/2021 0406 Gross per 24 hour  Intake --  Output 750 ml  Net -750 ml   Filed Weights   08/26/21 2138 08/28/21 0424  Weight: 54.4 kg 53.5 kg    Telemetry    NSR - Personally Reviewed  ECG    NA - Personally Reviewed  Physical Exam   GEN: No  acute distress.   Neck: No  JVD Cardiac: RRR, 3/6 apical systolic murmur, no diastolic murmurs, rubs, or gallops.  Mechanical S1 Respiratory: Clear    to auscultation bilaterally. GI: Soft,  nontender, non-distended, normal bowel sounds  MS:  Mild leg edema; No deformity. Neuro:   Nonfocal  Psych: Oriented and appropriate    Labs    Chemistry Recent Labs  Lab 08/26/21 2149 08/27/21 0155 08/28/21 0109  NA 130* 131* 134*  K 3.2* 3.2* 3.5  CL 93* 94* 95*  CO2 24 27 31   GLUCOSE 124* 117* 111*  BUN 34* 32* 28*  CREATININE 1.40* 1.37* 1.18*  CALCIUM 8.6* 8.2* 8.5*  ALBUMIN  --   --  3.8  GFRNONAA 39* 41* 48*  ANIONGAP 13 10 8      Hematology Recent Labs  Lab 08/26/21 2149 08/27/21 0155 08/28/21 0109  WBC 3.4* 2.9* 3.5*  RBC 4.42 3.93 4.19  HGB 12.3 11.4* 12.0  HCT 39.8 35.2* 37.8  MCV 90.0 89.6 90.2  MCH 27.8 29.0 28.6  MCHC 30.9 32.4 31.7  RDW 15.8* 15.7* 16.0*  PLT 111* 96* 104*    Cardiac EnzymesNo results for input(s): TROPONINI in the last 168 hours. No results for input(s): TROPIPOC in the last 168 hours.   BNP Recent Labs  Lab 08/26/21 2149  BNP 1,295.2*     DDimer No results for input(s): DDIMER in the last 168 hours.  Radiology    No results found.  Cardiac Studies   NA  Patient Profile     75 y.o. female  75 y.o. 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   Prior cardiologists: Dr. Andrey Campanile [facility]) Dr. Tollie Pizza [facility])   Assessment & Plan    SEVERE AS:  Discussed with Dr. Excell Seltzer.  His team will contact her to discuss the timing of her TAVR and next steps.  She will need to be recovered from her flu.   ACUTE ON CHRONIC SYSTOLIC HF:  07/22/21 EF 30 - 27%.   Net negative 2678 cc.  She is refusing Lasix and likely is euvolemic.     CKD:  Creatinine is coming down.     CAD:  No evidence of active ischemia.     ATRIAL FIB:  I talked with her again and she reports that she is absolutely intolerant of beta blockers.    She was  on Cardizem on admission apparently for rate control.  I would suggest restarting this before discharge as her BP allows.   (Home med list suggests SR 240 bid!)   She would like to go home on 120 bid.   MECHANICAL MV:  INR 3.9 yesterday  Warfarin per pharmcy.     For questions or updates, please contact [facility] Please consult [email or web address] for contact info under Cardiology/STEMI.   Signed, Rollene Rotunda, MD  08/29/2021, 2:16 PM

## 2021-08-29 NOTE — Care Management Important Message (Signed)
Important Message  Patient Details  Name: [name] [name] MRN: [id number] Date of Birth: 11/25/1945   Medicare Important Message Given:  Yes     Renie Ora 08/29/2021, 12:33 PM

## 2021-08-29 NOTE — Progress Notes (Signed)
Physical Therapy Treatment Patient Details Name: [name] [name] MRN: [id number] DOB: Oct 23, 1945 Today's Date: 08/29/2021   History of Present Illness Pt is a 75 y/o female admitted 08/26/2021 for influenza treatment (including hypoxia) and evaluation of severe aortic stenosis. 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    PT Comments    Pt is progressing well with mobility. Today's session focused on gait and stair training. Pt able to ambulate and complete stairs with supervision. Pt still took 2-3 standing rest breaks but was much improved from yesterday's session. SpO2 down to 85% on RA during activity, returned to 90s on 2L. Pt would benefit from further acute care PT services to continue to address functional mobility goals.      Recommendations for follow up therapy are one component of a multi-disciplinary discharge planning process, led by the attending physician.  Recommendations may be updated based on patient status, additional functional criteria and insurance authorization.  Follow Up Recommendations  No PT follow up     Assistance Recommended at Discharge Intermittent Supervision/Assistance  Equipment Recommendations  None recommended by PT    Recommendations for Other Services       Precautions / Restrictions Precautions Precautions: Other (comment);Fall Precaution Comments: watch 02 Restrictions Weight Bearing Restrictions: No     Mobility  Bed Mobility               General bed mobility comments: Pt received in recliner    Transfers Overall transfer level: Needs assistance Equipment used: None Transfers: Sit to/from Stand Sit to Stand: Supervision           General transfer comment: Able to stand from EOB with no LOB    Ambulation/Gait Ambulation/Gait assistance: Supervision Gait Distance (Feet): 342 Feet Assistive device:  None Gait Pattern/deviations: Step-through pattern;Decreased stride length;Narrow base of support Gait velocity: reduced     General Gait Details: Pt continues to reach for wall rail or furniture with 1 UE for support; pt took less standing rest breaks today (~2)   Stairs Stairs: Yes Stairs assistance: Supervision Stair Management: Step to pattern;One rail Right Number of Stairs: 4 General stair comments: Pt reported no issues with stairs; min gaurd for safety   Wheelchair Mobility    Modified Rankin (Stroke Patients Only)       Balance Overall balance assessment: Needs assistance   Sitting balance-Leahy Scale: Normal Sitting balance - Comments: able to sit edge of recliner idependently     Standing balance-Leahy Scale: Fair Standing balance comment: Pt able to static stand with no UE support; reaches for walls/furniture when ambulating                            Cognition Arousal/Alertness: Awake/alert Behavior During Therapy: WFL for tasks assessed/performed Overall Cognitive Status: Impaired/Different from baseline Area of Impairment: Attention;Memory                   Current Attention Level: Sustained Memory: Decreased short-term memory         General Comments: eager to participate, continually focused on medications and kidney disease        Exercises      General Comments General comments (skin integrity, edema, etc.): SpO2 down to 85% on RA during activity, in 90s on 2L during activity and 1L during rest      Pertinent Vitals/Pain Pain Assessment: Faces Faces Pain  Scale: Hurts a little bit Pain Location: back Pain Descriptors / Indicators: Discomfort;Aching Pain Intervention(s): Limited activity within patient's tolerance    Home Living                          Prior Function            PT Goals (current goals can now be found in the care plan section) Acute Rehab PT Goals Patient Stated Goal: to  improve PT Goal Formulation: With patient Time For Goal Achievement: 09/10/21 Potential to Achieve Goals: Good Progress towards PT goals: Progressing toward goals    Frequency    Min 3X/week      PT Plan Current plan remains appropriate    Co-evaluation              AM-PAC PT "6 Clicks" Mobility   Outcome Measure  Help needed turning from your back to your side while in a flat bed without using bedrails?: None Help needed moving from lying on your back to sitting on the side of a flat bed without using bedrails?: A Little Help needed moving to and from a bed to a chair (including a wheelchair)?: A Little Help needed standing up from a chair using your arms (e.g., wheelchair or bedside chair)?: A Little Help needed to walk in hospital room?: A Little Help needed climbing 3-5 steps with a railing? : A Little 6 Click Score: 19    End of Session Equipment Utilized During Treatment: Oxygen Activity Tolerance: Patient tolerated treatment well Patient left: in chair;with call bell/phone within reach;with chair alarm set Nurse Communication: Mobility status PT Visit Diagnosis: Unsteadiness on feet (R26.81);Other abnormalities of gait and mobility (R26.89);Muscle weakness (generalized) (M62.81);Difficulty in walking, not elsewhere classified (R26.2)     Time: [id number] PT Time Calculation (min) (ACUTE ONLY): 19 min  Charges:  $Gait Training: 8-22 mins                     Brandon Melnick, SPT    Brandon Melnick 08/29/2021, 12:28 PM

## 2021-08-29 NOTE — Progress Notes (Signed)
ANTICOAGULATION CONSULT NOTE - Follow Up Consult  Pharmacy Consult for Coumadin Indication: Mech MVR + afib,  Allergies  Allergen Reactions   Atenolol Other (See Comments)    HR and pulse dropped   Metoprolol Succinate [Metoprolol] Other (See Comments)    HR and pulse dropped    Patient Measurements: Height: 4\' 10"  (147.3 cm) Weight: 53.5 kg (117 lb 15.1 oz) IBW/kg (Calculated) : 40.9  Vital Signs: Temp: 98.3 F (36.8 C) (11/11 1933) Temp Source: Oral (11/11 1933) BP: 145/56 (11/11 1933) Pulse Rate: 66 (11/11 1933)  Labs: Recent Labs    08/26/21 2149 08/27/21 0001 08/27/21 0155 08/28/21 0109 08/29/21 1658  HGB 12.3  --  11.4* 12.0 12.2  HCT 39.8  --  35.2* 37.8 37.4  PLT 111*  --  96* 104* 87*  LABPROT 37.4*  --  38.6* 38.4* 23.0*  INR 3.8*  --  4.0* 3.9* 2.0*  CREATININE 1.40*  --  1.37* 1.18* 0.94  TROPONINIHS 41* 41*  --   --   --      Estimated Creatinine Clearance: 38 mL/min (by C-G formula based on SCr of 0.94 mg/dL).    Assessment: 75 y/o F with significant cardiac history presents to the ED with shortness of breath. She is under consideration for TAVR. 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  Patient refused all labs 11/11 am.  Since, INR yesterday was supratherapeutic at 3.9, it is not likely that she is subtherapeutic - will hold off on doing for today.   PTA Coumadin 7mg /d, admit INR 3.8  Goal of Therapy:  INR 2.5-3.5 Monitor platelets by anticoagulation protocol: Yes   Plan:  No warfarin tonight Daily PT/INR  13/11, PharmD, [facility] Clinical Pharmacist Please see AMION for all Pharmacists' Contact Phone Numbers 08/29/2021, 8:13 PM   PM Addendum:   INR came back 2. Significant drop from 3.9 yesterday. D/W [facility]. [facility], Will give a little extra warfarin (10mg ) tonight and give enoxaparin 1mg /kg x 1 to cover until the AM. Please reevaluate need for bridging in AM   Aneira Cavitt A. 13/08/2021, PharmD, BCPS, [facility] Clinical  Pharmacist [place] Please utilize Amion for appropriate phone number to reach the unit pharmacist [facility])

## 2021-08-29 NOTE — Progress Notes (Signed)
Pt w/ multiple complaints tonight related to her medications- stating she didn't receive anything she is supposed to get. I informed her that according to her day shift nurse, she has refused morning meds. She denies this- I gave her what she had not taken earlier (potassium and atorvastatin) per her request.  She was also concerned she had not been getting her coumadin or diltiazem- I did inform pharmacy and on-call for [facility] MD about her INR of 2.0 (resulted at 5pm today) and a one-time dose of 28m coumadin was given. She did refuse the Lovenox despite me explaining the reasoning for giving it.  As for the diltiazem, I encouraged pt to speak with her cardiologist about this medication.   She was also upset about various other things- she is currently refusing for staff to speak with her daughter (Lattie Haw about her care. She also states she plans to leave tomorrow morning and had transportation already arranged. She still remains on oxygen. Will pass this info along to day shift staff.  All other needs at this time met.

## 2021-08-29 NOTE — Progress Notes (Signed)
Occupational Therapy Treatment Patient Details Name: [name] [name] MRN: [id number] DOB: Mar 12, 1946 Today's Date: 08/29/2021   History of present illness Pt is a 75 y/o female admitted 08/26/2021 for influenza treatment (including hypoxia) and evaluation of severe aortic stenosis. 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   OT comments  Pt seated in recliner upon entry, agreeable to OT session.  Pt hyper-focused on chronic kidney disease during session, redirection required to engage in ADLs.  Educated on energy conservation techniques and provided handout, limited carryover noted.  Pt completing transfers and in room mobility with supervision, min cueing at times for O2 line mgmt.  Standing at sink to complete grooming with modified independence.  Spoke to RN about further education on medical diagnoses. Continue to recommend no OT follow up.    Recommendations for follow up therapy are one component of a multi-disciplinary discharge planning process, led by the attending physician.  Recommendations may be updated based on patient status, additional functional criteria and insurance authorization.    Follow Up Recommendations  No OT follow up    Assistance Recommended at Discharge Intermittent Supervision/Assistance  Equipment Recommendations  Tub/shower seat;BSC/3in1 (either/or--to be used as a shower seat)    Recommendations for Other Services      Precautions / Restrictions Precautions Precautions: Other (comment);Fall Precaution Comments: watch 02 Restrictions Weight Bearing Restrictions: No       Mobility Bed Mobility               General bed mobility comments: Pt received in recliner    Transfers Overall transfer level: Needs assistance Equipment used: None Transfers: Sit to/from Stand Sit to Stand: Supervision           General transfer comment:  for safety, line mgmt     Balance Overall balance assessment: Needs assistance   Sitting balance-Leahy Scale: Normal Sitting balance - Comments: able to sit edge of recliner idependently     Standing balance-Leahy Scale: Fair Standing balance comment: Pt able to static stand with no UE support; reaches for walls/furniture when ambulating                           ADL either performed or assessed with clinical judgement   ADL Overall ADL's : Needs assistance/impaired     Grooming: Modified independent;Standing                   Toilet Transfer: Supervision/safety;Ambulation           Functional mobility during ADLs: Supervision/safety;Cueing for safety General ADL Comments: cueing for O2 line mgmt    Extremity/Trunk Assessment              Vision       Perception     Praxis      Cognition Arousal/Alertness: Awake/alert Behavior During Therapy: Anxious Overall Cognitive Status: Impaired/Different from baseline Area of Impairment: Attention;Memory;Problem solving                   Current Attention Level: Sustained Memory: Decreased short-term memory       Problem Solving: Slow processing;Requires verbal cues General Comments: pt anxious, highly distracted by CKD diagnosis and medications. poor understanding medically.          Exercises     Shoulder Instructions       General Comments Spo2 on 2L >90%.  Reviewed energy conservation  techniques, provided handout.    Pertinent Vitals/ Pain       Pain Assessment: No/denies pain Faces Pain Scale: Hurts a little bit Pain Location: back Pain Descriptors / Indicators: Discomfort;Aching Pain Intervention(s): Limited activity within patient's tolerance  Home Living                                          Prior Functioning/Environment              Frequency  Min 2X/week        Progress Toward Goals  OT Goals(current goals can now be found in  the care plan section)  Progress towards OT goals: Progressing toward goals  Acute Rehab OT Goals Patient Stated Goal: get back home OT Goal Formulation: With patient  Plan Discharge plan remains appropriate;Frequency remains appropriate    Co-evaluation                 AM-PAC OT "6 Clicks" Daily Activity     Outcome Measure   Help from another person eating meals?: None Help from another person taking care of personal grooming?: None Help from another person toileting, which includes using toliet, bedpan, or urinal?: None Help from another person bathing (including washing, rinsing, drying)?: A Little Help from another person to put on and taking off regular upper body clothing?: None Help from another person to put on and taking off regular lower body clothing?: A Little 6 Click Score: 22    End of Session Equipment Utilized During Treatment: Oxygen (2L)  OT Visit Diagnosis: History of falling (Z91.81);Muscle weakness (generalized) (M62.81)   Activity Tolerance Patient tolerated treatment well   Patient Left in chair;with call bell/phone within reach;with chair alarm set   Nurse Communication Mobility status;Other (comment) (CKD- pt needs more education)        Time: 6734-1937 OT Time Calculation (min): 21 min  Charges: OT General Charges $OT Visit: 1 Visit OT Treatments $Self Care/Home Management : 8-22 mins  Barry Brunner, OT Acute Rehabilitation Services Pager [phone] Office [phone]   Chancy Milroy 08/29/2021, 12:56 PM

## 2021-08-29 NOTE — Progress Notes (Signed)
Mobility Specialist: Progress Note   08/29/21 1653  Mobility  Activity Refused mobility   Pt asleep upon entering room but easily awakened. Pt refused mobility d/t just waking up and wanting to get more rest. Will f/u as able.  [facility] Yoltzin Barg Mobility Specialist Mobility Specialist Phone: [phone]

## 2021-08-29 NOTE — Progress Notes (Signed)
Mobility Specialist: Progress Note   08/29/21 1248  Mobility  Activity Ambulated in hall  Level of Assistance Independent  Assistive Device None  Distance Ambulated (ft) 430 ft  Mobility Ambulated independently in hallway  Mobility Response Tolerated well  Mobility performed by Mobility specialist  $Mobility charge 1 Mobility   Pre-Mobility: 74 HR, 96% SpO2 Post-Mobility: 75 HR, 94% SpO2  Pt ambulated on 2 L/min [place]. Pt stopped x3 for short standing breaks d/t feeling SOB. Pt is sitting EOB after walk with call bell and phone at her side.   [facility] Adrielle Polakowski Mobility Specialist Mobility Specialist Phone: [phone]

## 2021-08-30 DIAGNOSIS — Z9189 Other specified personal risk factors, not elsewhere classified: Secondary | ICD-10-CM

## 2021-08-30 DIAGNOSIS — I350 Nonrheumatic aortic (valve) stenosis: Secondary | ICD-10-CM | POA: Diagnosis not present

## 2021-08-30 DIAGNOSIS — Z7901 Long term (current) use of anticoagulants: Secondary | ICD-10-CM | POA: Diagnosis not present

## 2021-08-30 DIAGNOSIS — R7989 Other specified abnormal findings of blood chemistry: Secondary | ICD-10-CM

## 2021-08-30 DIAGNOSIS — I5023 Acute on chronic systolic (congestive) heart failure: Secondary | ICD-10-CM | POA: Diagnosis not present

## 2021-08-30 DIAGNOSIS — I498 Other specified cardiac arrhythmias: Secondary | ICD-10-CM

## 2021-08-30 DIAGNOSIS — N179 Acute kidney failure, unspecified: Secondary | ICD-10-CM

## 2021-08-30 DIAGNOSIS — J101 Influenza due to other identified influenza virus with other respiratory manifestations: Secondary | ICD-10-CM | POA: Diagnosis not present

## 2021-08-30 DIAGNOSIS — R791 Abnormal coagulation profile: Secondary | ICD-10-CM

## 2021-08-30 LAB — PROTIME-INR
INR: 2.2 — ABNORMAL HIGH (ref 0.8–1.2)
Prothrombin Time: 24.4 seconds — ABNORMAL HIGH (ref 11.4–15.2)

## 2021-08-30 MED ORDER — ADULT MULTIVITAMIN W/MINERALS CH: 1.0000 | ORAL_TABLET | Freq: Every day | ORAL | Status: DC

## 2021-08-30 MED ORDER — GUAIFENESIN ER 600 MG PO TB12: 600.0000 mg | ORAL_TABLET | Freq: Two times a day (BID) | ORAL | Status: AC

## 2021-08-30 MED ORDER — WARFARIN SODIUM 5 MG PO TABS: ORAL_TABLET | ORAL | 0 refills | Status: DC

## 2021-08-30 MED ORDER — OSELTAMIVIR PHOSPHATE 6 MG/ML PO SUSR: 75.0000 mg | Freq: Every day | ORAL | 0 refills | Status: AC

## 2021-08-30 MED ORDER — DILT-XR 240 MG PO CP24: 240.0000 mg | ORAL_CAPSULE | Freq: Every day | ORAL | Status: DC

## 2021-08-30 NOTE — Progress Notes (Signed)
Nursing dc note  Patient alert and oriented. Both patient and daughter Cala Bradford vebalized understanding of dc instructions. All belongings given to patient. Patient denies cp or sob.piv dcd.site unremarkable.

## 2021-08-30 NOTE — Discharge Summary (Signed)
Physician Discharge Summary  [name] [name] [id number] DOB: May 01, 1946 DOA: 08/26/2021  PCP: Ronita Hipps, MD  Admit date: 08/26/2021 Discharge date: 08/30/2021 Admitted From: Home Disposition: Home Recommendations for Outpatient Follow-up:  Follow ups as below. Please obtain CBC/BMP/Mag at follow up Patient is at risk for polypharmacy.  She is on high-dose baclofen, Ambien and Xanax.   Patient to follow-up with cardiology to discuss about a valve replacement Please follow up on the following pending results: None Home Health: Not indicated Equipment/Devices: Not indicated Discharge Condition: Stable CODE STATUS: Full code  Follow-up Information     Ronita Hipps, MD. Schedule an appointment as soon as possible for a visit in 1 week(s).   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 fib, SSS/PPM, RBBB and CKD-3 presenting with chest pressure, shortness of breath, cough, congestion and fever, and admitted for acute hypoxic RF due to influenza A infection and acute on chronic systolic CHF.  Initially seen at PCP office and diagnosed with influenza A and sent home on Tamiflu but symptoms gotten worse and called EMS.     On admission, CXR with cardiomegaly and vascular congestion.  INR 3.8.  BNP 1295.  Troponin 41x2.  Started on IV Lasix and Tamiflu.  Cardiology consulted.   Patient's symptoms improved with diuretics and Tamiflu.  After improvement in her breathing, patient refused further diuretics stating that she is dehydrated.  She was also unhappy, somewhat agitated and skeptical of providers and staff.  Upon discussion with patient's daughter, Lattie Haw she had similar incident when she was at [facility] recently, and that family had to  take her home.   On the day of discharge, patient was liberated off oxygen.  Evaluated by therapy and no need was identified.  She is discharged on home p.o. Lasix.  We have reduced her Cardizem CD from twice daily to daily.  Also discontinued home Lasix given risk for dehydration.  Patient to follow-up at warfarin clinic early next week, and PCP in 1 to 2 weeks and cardiology as previously planned.   See individual problem list below for more on hospital course.  Discharge Diagnoses:  Acute respiratory failure with hypoxia due to influenza A infection and CHF exacerbation: Resolved. -Continue Tamiflu for 1 more day -Continue home Lasix   Influenza A infection: -Continue Tamiflu for 1 more day   Acute on chronic systolic CHF/moderate PAH: TTE on 10/4 with LVEF of 30 to 35% (from 25% previously), RWMA, severe hypokinesis, sev LAE, critically severe AS,  mod AR, mod TR and mod PAH.  BNP elevated to 1300.  CXR with vascular congestion and cardiomegaly.  Diuresed with IV Lasix.  Net -2.7 L.  Patient refused further diuretics.  Respiratory failure resolved.   -Discharged on home p.o. Lasix -Discontinued home hydrochlorothiazide -GDMT-on Atacand.  Not on BB due to history of intolerance. -Outpatient follow-up with cardiology   Elevated troponin/history of CAD/remote stenting-has chest pressure on presentation likely due to the above.  TTE as above.  Mildly elevated troponin at 41x2 suggests demand ischemia versus ACS. -Manage CHF and influenza as above.   Severe aortic stenosis -Undergoing evaluation for TAVR outpatient.   Agitation/possible delirium/noncompliance: Patient was angry and refused care including  medications and lab draws the morning of 11/11 but was calmer in the day.  Patient's daughter, Lattie Haw reports similar incident when she was at [facility]. Patient is on significant dose of baclofen, Xanax and Ambien at home which increases her risk of confusion, fall and even respiratory arrest.   Lattie Haw is concerned about all of these but has been a struggle to get patient of these medications.  Patient is dismissive when I brought up this topic.  I offered trying Seroquel at night which could help with sleep and some of his symptoms.  Patient gets angry stating "I am not crazy.  I have been taking those medications for 20 years but I do not take them every day". -Strongly recommend reviewing.  Encouraged to discuss with PCP/prescriber.   Moderate tricuspid valve regurgitation: She might be preload dependent.   Hypotension/history of essential hypertension: Normotensive for most part. -Decreased Cardizem.  Discontinued HCTZ.  -Continue home Atacand and Lasix.   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 fib: Rate controlled.  On Cardizem and warfarin at home.  INR subtherapeutic at 2.2 after holding warfarin for subtherapeutic INR. -Discharge warfarin 10 mg this evening, then resume home 7.5 mg daily. -Follow-up at INR clinic earlier next week -Reports history of BB intolerance -Resume home Cardizem CD at 240 mg daily instead of twice daily   Hyponatremia/hypokalemia-likely due to HCTZ.  Hyponatremia stable.  Hypokalemia resolved.   AKI/azotemia on CKD-3A/azotemia: Resolved. -Discontinued HCTZ. -Recheck renal function at follow-up   History of SSS/PPM -Per cardiology.   Pancytopenia: Anemia seems to have resolved.  Still with leukopenia and thrombocytopenia but stable. -Recheck CBC at follow-up.   Physical deconditioning: Improved.  Evaluated by therapy and no needles identified.  Moderate malnutrition Body mass index is 23.96 kg/m. Nutrition Problem: Moderate Malnutrition Etiology: chronic illness (CHF and CKD3) Signs/Symptoms: mild fat depletion, moderate muscle depletion Interventions: Ensure Enlive (each supplement provides 350kcal and 20 grams of protein)     Discharge Exam: Vitals:   08/30/21  0435 08/30/21 0450 08/30/21 0733 08/30/21 1200  BP: (!) 112/53  (!) 107/53   Pulse: 72  66 75  Temp:   98 F (36.7 C)   Resp:   19   Height:      Weight:  52 kg    SpO2: 96%  96% 92%  TempSrc:   Oral   BMI (Calculated):  23.97       GENERAL: No apparent distress.  Nontoxic. HEENT: MMM.  Vision and hearing grossly intact.  NECK: Supple.  No apparent JVD.  RESP: 93% on RA at rest.  No IWOB.  Fair aeration bilaterally. CVS: RRR.  2/6 SEM over RUSB.  Mechanical heart sound over LLSB.  Heart sounds normal.  ABD/GI/GU: Bowel sounds present. Soft. Non tender.  MSK/EXT:  Moves extremities. No apparent deformity.  Trace BLE edema. SKIN: no apparent skin lesion or wound NEURO: Awake and alert.  Oriented appropriately.  No apparent focal neuro deficit. PSYCH: Calm.  Easily gets upset.  Discharge Instructions  Discharge Instructions     (HEART FAILURE PATIENTS) Call MD:  Anytime you have any of the following symptoms: 1) 3 pound weight gain in 24 hours or 5 pounds in 1 week 2) shortness of breath, with or without a dry hacking cough 3) swelling in the hands, feet or stomach 4) if you have to sleep on extra pillows at night in order to breathe.   Complete by: As directed  Call MD for:  difficulty breathing, headache or visual disturbances   Complete by: As directed    Call MD for:  extreme fatigue   Complete by: As directed    Call MD for:  persistant dizziness or light-headedness   Complete by: As directed    Diet - low sodium heart healthy   Complete by: As directed    Discharge instructions   Complete by: As directed    It has been a pleasure taking care of you!  You were hospitalized with shortness of breath and cough likely from influenza infection and heart failure exacerbation.  You have been treated with Tamiflu for influenza, and IV Lasix for heart failure exacerbation.  Your symptoms improved. Take 10 mg  (two tablets) of your warfarin this evening and resume 7.5 mg  (1.5  tablets) after that. Please have your INR rechecked early next week. Please follow-up with your primary care doctor in 1 to 2 weeks or sooner if needed.  Follow-up with your cardiologist as previously planned.   In addition to taking your medications as prescribed, we also recommend you avoid alcohol or over-the-counter pain medication other than plain Tylenol, limit the amount of water/fluid you drink to less than 6 cups (1500 cc) a day,  limit your sodium (salt) intake to less than 2 g (2000 mg) a day and weigh yourself daily at the same time and keeping your weight log.   We noted that you are on high-dose baclofen, Ambien and Xanax.  This combination is very dangerous, and can cause sedation, increased risk of fall or respiratory depression.  We strongly recommend you cut down on this medications or discuss with your prescribers for better alternatives.    Take care,   Increase activity slowly   Complete by: As directed       Allergies as of 08/30/2021       Reactions   Atenolol Other (See Comments)   HR and pulse dropped   Metoprolol Succinate [metoprolol] Other (See Comments)   HR and pulse dropped        Medication List     STOP taking these medications    amoxicillin-clavulanate 875-125 MG tablet Commonly known as: AUGMENTIN   hydrochlorothiazide 25 MG tablet Commonly known as: HYDRODIURIL       TAKE these medications    albuterol 108 (90 Base) MCG/ACT inhaler Commonly known as: VENTOLIN HFA Inhale 1 puff into the lungs every 6 (six) hours as needed for shortness of breath.   ALPRAZolam 1 MG tablet Commonly known as: XANAX Take 1 mg by mouth in the morning and at bedtime.   atorvastatin 80 MG tablet Commonly known as: LIPITOR Take 80 mg by mouth at bedtime.   baclofen 20 MG tablet Commonly known as: LIORESAL Take 10-20 mg by mouth every 8 (eight) hours as needed for muscle spasms.   candesartan 8 MG tablet Commonly known as: ATACAND Take 4 mg by  mouth daily.   Dilt-XR 240 MG 24 hr capsule Generic drug: diltiazem Take 1 capsule (240 mg total) by mouth daily. What changed: when to take this   furosemide 40 MG tablet Commonly known as: LASIX Take 40 mg by mouth daily.   guaiFENesin 600 MG 12 hr tablet Commonly known as: MUCINEX Take 1 tablet (600 mg total) by mouth 2 (two) times daily for 5 days.   levothyroxine 75 MCG tablet Commonly known as: SYNTHROID Take 75 mcg by mouth daily.   multivitamin with minerals Tabs tablet  Take 1 tablet by mouth daily.   nitroGLYCERIN 0.4 MG SL tablet Commonly known as: NITROSTAT Place 0.4 mg under the tongue every 5 (five) minutes as needed for chest pain.   oseltamivir 6 MG/ML Susr suspension Commonly known as: TAMIFLU Take 12.5 mLs (75 mg total) by mouth daily for 1 day. For 5 days What changed: how much to take   potassium chloride SA 20 MEQ tablet Commonly known as: KLOR-CON Take 40 mEq by mouth daily.   warfarin 5 MG tablet Commonly known as: COUMADIN Take 2 tablets (10 mg total) by mouth daily for 1 day, THEN 1.5 tablets (7.5 mg total) daily. Start taking on: August 30, 2021 What changed: See the new instructions.   zolpidem 10 MG tablet Commonly known as: AMBIEN Take 10 mg by mouth at bedtime.        Consultations: Cardiology  Procedures/Studies:   [facility] Chest Port 1 View  Result Date: 08/26/2021 CLINICAL DATA:  Shortness of breath. EXAM: PORTABLE CHEST 1 VIEW COMPARISON:  Chest radiograph dated 07/21/2021. FINDINGS: Cardiomegaly with vascular congestion. Bilateral mid to lower lung field densities may represent atelectasis, or vascular congestion. Developing infiltrate is not excluded clinical correlation recommended no pneumothorax. Median sternotomy wires and mechanical cardiac valve. Atherosclerotic calcification of the aorta. Left pectoral pacemaker device. No acute osseous pathology. IMPRESSION: Cardiomegaly with vascular congestion. Developing infiltrate is  not excluded. Electronically Signed   By: Anner Crete M.D.   On: 08/26/2021 23:11       The results of significant diagnostics from this hospitalization (including imaging, microbiology, ancillary and laboratory) are listed below for reference.     Microbiology: Recent Results (from the past 240 hour(s))  Resp Panel by RT-PCR (Flu A&B, Covid) Nasopharyngeal Swab     Status: Abnormal   Collection Time: 08/26/21  9:49 PM   Specimen: Nasopharyngeal Swab; Nasopharyngeal(NP) swabs in vial transport medium  Result Value Ref Range Status   SARS Coronavirus 2 by RT PCR NEGATIVE NEGATIVE Final    Comment: (NOTE) SARS-CoV-2 target nucleic acids are NOT DETECTED.  The SARS-CoV-2 RNA is generally detectable in upper respiratory specimens during the acute phase of infection. The lowest concentration of SARS-CoV-2 viral copies this assay can detect is 138 copies/mL. A negative result does not preclude SARS-Cov-2 infection and should not be used as the sole basis for treatment or other patient management decisions. A negative result may occur with  improper specimen collection/handling, submission of specimen other than nasopharyngeal swab, presence of viral mutation(s) within the areas targeted by this assay, and inadequate number of viral copies(<138 copies/mL). A negative result must be combined with clinical observations, patient history, and epidemiological information. The expected result is Negative.  Fact Sheet for Patients:  EntrepreneurPulse.com.au  Fact Sheet for Healthcare Providers:  IncredibleEmployment.be  This test is no t yet approved or cleared by the [place] FDA and  has been authorized for detection and/or diagnosis of SARS-CoV-2 by FDA under an Emergency Use Authorization (EUA). This EUA will remain  in effect (meaning this test can be used) for the duration of the COVID-19 declaration under Section 564(b)(1) of the Act,  21 U.S.C.section 360bbb-3(b)(1), unless the authorization is terminated  or revoked sooner.       Influenza A by PCR POSITIVE (A) NEGATIVE Final   Influenza B by PCR NEGATIVE NEGATIVE Final    Comment: (NOTE) The Xpert Xpress SARS-CoV-2/FLU/RSV plus assay is intended as an aid in the diagnosis of influenza from Nasopharyngeal swab specimens and should  not be used as a sole basis for treatment. Nasal washings and aspirates are unacceptable for Xpert Xpress SARS-CoV-2/FLU/RSV testing.  Fact Sheet for Patients: EntrepreneurPulse.com.au  Fact Sheet for Healthcare Providers: IncredibleEmployment.be  This test is not yet approved or cleared by the [place] FDA and has been authorized for detection and/or diagnosis of SARS-CoV-2 by FDA under an Emergency Use Authorization (EUA). This EUA will remain in effect (meaning this test can be used) for the duration of the COVID-19 declaration under Section 564(b)(1) of the Act, 21 U.S.C. section 360bbb-3(b)(1), unless the authorization is terminated or revoked.  Performed at [facility], [place] [address], [place], [place] [address]      Labs:  CBC: Recent Labs  Lab 08/26/21 2149 08/27/21 0155 08/28/21 0109 08/29/21 1658  WBC 3.4* 2.9* 3.5* 2.8*  NEUTROABS 2.5  --   --   --   HGB 12.3 11.4* 12.0 12.2  HCT 39.8 35.2* 37.8 37.4  MCV 90.0 89.6 90.2 89.0  PLT 111* 96* 104* 87*   BMP &GFR Recent Labs  Lab 08/26/21 2149 08/27/21 0155 08/28/21 0109 08/29/21 1658  NA 130* 131* 134* 132*  K 3.2* 3.2* 3.5 4.2  CL 93* 94* 95* 94*  CO2 24 27 31 27   GLUCOSE 124* 117* 111* 89  BUN 34* 32* 28* 20  CREATININE 1.40* 1.37* 1.18* 0.94  CALCIUM 8.6* 8.2* 8.5* 8.5*  MG  --  1.9 1.9 2.1  PHOS  --   --  2.6 2.8   Estimated Creatinine Clearance: 37.5 mL/min (by C-G formula based on SCr of 0.94 mg/dL). Liver & Pancreas: Recent Labs  Lab 08/28/21 0109 08/29/21 1658  ALBUMIN 3.8 3.4*   No  results for input(s): LIPASE, AMYLASE in the last 168 hours. No results for input(s): AMMONIA in the last 168 hours. Diabetic: No results for input(s): HGBA1C in the last 72 hours. No results for input(s): GLUCAP in the last 168 hours. Cardiac Enzymes: No results for input(s): CKTOTAL, CKMB, CKMBINDEX, TROPONINI in the last 168 hours. No results for input(s): PROBNP in the last 8760 hours. Coagulation Profile: Recent Labs  Lab 08/26/21 2149 08/27/21 0155 08/28/21 0109 08/29/21 1658 08/30/21 0258  INR 3.8* 4.0* 3.9* 2.0* 2.2*   Thyroid Function Tests: No results for input(s): TSH, T4TOTAL, FREET4, T3FREE, THYROIDAB in the last 72 hours. Lipid Profile: No results for input(s): CHOL, HDL, LDLCALC, TRIG, CHOLHDL, LDLDIRECT in the last 72 hours. Anemia Panel: No results for input(s): VITAMINB12, FOLATE, FERRITIN, TIBC, IRON, RETICCTPCT in the last 72 hours. Urine analysis: No results found for: COLORURINE, APPEARANCEUR, LABSPEC, PHURINE, GLUCOSEU, HGBUR, BILIRUBINUR, KETONESUR, PROTEINUR, UROBILINOGEN, NITRITE, LEUKOCYTESUR Sepsis Labs: Invalid input(s): PROCALCITONIN, LACTICIDVEN   Time coordinating discharge: 55 minutes  SIGNED:  Mercy Riding, MD  [facility] 08/30/2021, 4:37 PM

## 2021-09-08 DIAGNOSIS — Z7901 Long term (current) use of anticoagulants: Secondary | ICD-10-CM | POA: Diagnosis not present

## 2021-09-08 DIAGNOSIS — Z09 Encounter for follow-up examination after completed treatment for conditions other than malignant neoplasm: Secondary | ICD-10-CM | POA: Diagnosis not present

## 2021-09-08 DIAGNOSIS — Z2821 Immunization not carried out because of patient refusal: Secondary | ICD-10-CM | POA: Diagnosis not present

## 2021-09-08 DIAGNOSIS — Z952 Presence of prosthetic heart valve: Secondary | ICD-10-CM | POA: Diagnosis not present

## 2021-09-08 DIAGNOSIS — I10 Essential (primary) hypertension: Secondary | ICD-10-CM | POA: Diagnosis not present

## 2021-09-15 ENCOUNTER — Ambulatory Visit: Payer: Medicare Other | Admitting: Cardiology

## 2021-09-15 ENCOUNTER — Telehealth: Payer: Self-pay | Admitting: Cardiology

## 2021-09-15 DIAGNOSIS — I350 Nonrheumatic aortic (valve) stenosis: Secondary | ICD-10-CM

## 2021-09-15 NOTE — Telephone Encounter (Signed)
thx

## 2021-09-15 NOTE — Telephone Encounter (Signed)
  [facility]   MULTIDISCIPLINARY HEART VALVE TEAM  Patient scheduled to be seen for pre-TAVR evaluation after being hospitalized for the flu. Her appointment was at 1pm. At 1:10pm, she was personally contacted and stated that she reached out to the office on Friday to cancel her appointment for today. She states that she still does not feel great after discharge but reports she has had no further LE edema or SOB. She states that she had definite COVID exposure over Thanksgiving holiday but has not taken a home test. We discussed that the tentative plan was for cardiac CT scans tomorrow and TAVR next Tuesday 09/23/21 with hospitalization over the weekend for Heparin bridge however she wishes to push out her procedure until 10/2021-11/2021. Reiterated the severity of aortic valve disease progression. The patient understands and agrees. We will contact her once another plan is made.   Georgie Chard NP-C Structural Heart Team  Pager: [phone]

## 2021-09-16 NOTE — Telephone Encounter (Signed)
Left message for pt to contact the office to arrange appointment.

## 2021-09-19 NOTE — Telephone Encounter (Signed)
Left message for pt to contact the office to arrange appointment.

## 2021-09-23 NOTE — Telephone Encounter (Signed)
The pt has been scheduled for TAVR CT scans and cardiac surgeon evaluation.

## 2021-09-26 ENCOUNTER — Telehealth: Payer: Self-pay

## 2021-09-26 NOTE — Telephone Encounter (Signed)
From: Wendall Stade, MD  Sent: 09/23/2021   3:24 PM EST  To: Iona Coach, RN, Lennie Odor, RN, *                  Make sure she is well beta blocked including lopressor 2 hours before and gets nitro with TAVR protocol scan     Spoke with the patient at great length. She absolutely refuses betablocker because she had an episode where her heart dropped to zero beats per minute after taking metoprolol once. Reiterated to her several times that she now has a PPM and it will protect her heart rate.   She repeated several times that while resting and sitting at home, her heart rate is in the 60s. While on the phone, she checked her HR and it was 103. She had not taken her diltiazem yet that she is supposed to take twice daily. Instructed her to take her medication now.   Discussed ivabradine and she was also disinterested. She states her HR will be low at the CT scan. She will take her dilt the night before and at 0830 the day of tge CT. She understands if her HR is not low enough for scans her surgery plans will be postponed.   She was grateful for assistance.

## 2021-09-30 ENCOUNTER — Other Ambulatory Visit: Payer: Self-pay

## 2021-09-30 ENCOUNTER — Inpatient Hospital Stay (HOSPITAL_COMMUNITY)
Admission: EM | Admit: 2021-09-30 | Discharge: 2021-10-06 | DRG: 286 | Disposition: A | Discharge status: Home / Self Care | Payer: Medicare Other | Attending: Internal Medicine | Admitting: Internal Medicine

## 2021-09-30 ENCOUNTER — Encounter (HOSPITAL_COMMUNITY): Payer: Self-pay

## 2021-09-30 ENCOUNTER — Ambulatory Visit (HOSPITAL_COMMUNITY)
Admission: RE | Admit: 2021-09-30 | Discharge: 2021-09-30 | Disposition: A | Discharge status: Home / Self Care | Payer: Medicare Other | Source: Ambulatory Visit | Attending: Physician Assistant | Admitting: Physician Assistant

## 2021-09-30 ENCOUNTER — Emergency Department (HOSPITAL_COMMUNITY): Payer: Medicare Other

## 2021-09-30 ENCOUNTER — Encounter (HOSPITAL_COMMUNITY): Payer: Self-pay | Admitting: Emergency Medicine

## 2021-09-30 DIAGNOSIS — N179 Acute kidney failure, unspecified: Secondary | ICD-10-CM | POA: Diagnosis not present

## 2021-09-30 DIAGNOSIS — I110 Hypertensive heart disease with heart failure: Secondary | ICD-10-CM | POA: Diagnosis not present

## 2021-09-30 DIAGNOSIS — J449 Chronic obstructive pulmonary disease, unspecified: Secondary | ICD-10-CM | POA: Diagnosis present

## 2021-09-30 DIAGNOSIS — I482 Chronic atrial fibrillation, unspecified: Secondary | ICD-10-CM | POA: Diagnosis not present

## 2021-09-30 DIAGNOSIS — I350 Nonrheumatic aortic (valve) stenosis: Secondary | ICD-10-CM

## 2021-09-30 DIAGNOSIS — K5900 Constipation, unspecified: Secondary | ICD-10-CM | POA: Diagnosis not present

## 2021-09-30 DIAGNOSIS — I5020 Unspecified systolic (congestive) heart failure: Secondary | ICD-10-CM | POA: Diagnosis not present

## 2021-09-30 DIAGNOSIS — Z950 Presence of cardiac pacemaker: Secondary | ICD-10-CM | POA: Diagnosis present

## 2021-09-30 DIAGNOSIS — I252 Old myocardial infarction: Secondary | ICD-10-CM

## 2021-09-30 DIAGNOSIS — Z79890 Hormone replacement therapy (postmenopausal): Secondary | ICD-10-CM

## 2021-09-30 DIAGNOSIS — I5042 Chronic combined systolic (congestive) and diastolic (congestive) heart failure: Secondary | ICD-10-CM

## 2021-09-30 DIAGNOSIS — Z955 Presence of coronary angioplasty implant and graft: Secondary | ICD-10-CM | POA: Diagnosis not present

## 2021-09-30 DIAGNOSIS — I130 Hypertensive heart and chronic kidney disease with heart failure and stage 1 through stage 4 chronic kidney disease, or unspecified chronic kidney disease: Secondary | ICD-10-CM | POA: Diagnosis not present

## 2021-09-30 DIAGNOSIS — Z888 Allergy status to other drugs, medicaments and biological substances status: Secondary | ICD-10-CM | POA: Diagnosis not present

## 2021-09-30 DIAGNOSIS — Z5181 Encounter for therapeutic drug level monitoring: Secondary | ICD-10-CM

## 2021-09-30 DIAGNOSIS — I2510 Atherosclerotic heart disease of native coronary artery without angina pectoris: Secondary | ICD-10-CM | POA: Diagnosis present

## 2021-09-30 DIAGNOSIS — I509 Heart failure, unspecified: Secondary | ICD-10-CM

## 2021-09-30 DIAGNOSIS — Z7901 Long term (current) use of anticoagulants: Secondary | ICD-10-CM | POA: Diagnosis not present

## 2021-09-30 DIAGNOSIS — I480 Paroxysmal atrial fibrillation: Secondary | ICD-10-CM | POA: Diagnosis present

## 2021-09-30 DIAGNOSIS — N1831 Chronic kidney disease, stage 3a: Secondary | ICD-10-CM | POA: Diagnosis present

## 2021-09-30 DIAGNOSIS — E1122 Type 2 diabetes mellitus with diabetic chronic kidney disease: Secondary | ICD-10-CM | POA: Diagnosis present

## 2021-09-30 DIAGNOSIS — I272 Pulmonary hypertension, unspecified: Secondary | ICD-10-CM | POA: Diagnosis present

## 2021-09-30 DIAGNOSIS — Z91040 Latex allergy status: Secondary | ICD-10-CM

## 2021-09-30 DIAGNOSIS — I517 Cardiomegaly: Secondary | ICD-10-CM | POA: Diagnosis not present

## 2021-09-30 DIAGNOSIS — I5043 Acute on chronic combined systolic (congestive) and diastolic (congestive) heart failure: Secondary | ICD-10-CM | POA: Diagnosis present

## 2021-09-30 DIAGNOSIS — Z952 Presence of prosthetic heart valve: Secondary | ICD-10-CM

## 2021-09-30 DIAGNOSIS — R0902 Hypoxemia: Secondary | ICD-10-CM | POA: Diagnosis not present

## 2021-09-30 DIAGNOSIS — I498 Other specified cardiac arrhythmias: Secondary | ICD-10-CM | POA: Diagnosis present

## 2021-09-30 DIAGNOSIS — J90 Pleural effusion, not elsewhere classified: Secondary | ICD-10-CM | POA: Diagnosis not present

## 2021-09-30 DIAGNOSIS — N183 Chronic kidney disease, stage 3 unspecified: Secondary | ICD-10-CM | POA: Diagnosis present

## 2021-09-30 DIAGNOSIS — I248 Other forms of acute ischemic heart disease: Secondary | ICD-10-CM | POA: Diagnosis not present

## 2021-09-30 DIAGNOSIS — Z9049 Acquired absence of other specified parts of digestive tract: Secondary | ICD-10-CM | POA: Diagnosis not present

## 2021-09-30 DIAGNOSIS — E039 Hypothyroidism, unspecified: Secondary | ICD-10-CM | POA: Diagnosis present

## 2021-09-30 DIAGNOSIS — Z79899 Other long term (current) drug therapy: Secondary | ICD-10-CM

## 2021-09-30 DIAGNOSIS — F410 Panic disorder [episodic paroxysmal anxiety] without agoraphobia: Secondary | ICD-10-CM | POA: Diagnosis present

## 2021-09-30 DIAGNOSIS — R0602 Shortness of breath: Secondary | ICD-10-CM | POA: Diagnosis not present

## 2021-09-30 DIAGNOSIS — Z66 Do not resuscitate: Secondary | ICD-10-CM

## 2021-09-30 DIAGNOSIS — I495 Sick sinus syndrome: Secondary | ICD-10-CM | POA: Diagnosis not present

## 2021-09-30 DIAGNOSIS — K760 Fatty (change of) liver, not elsewhere classified: Secondary | ICD-10-CM | POA: Diagnosis not present

## 2021-09-30 DIAGNOSIS — E785 Hyperlipidemia, unspecified: Secondary | ICD-10-CM | POA: Diagnosis present

## 2021-09-30 DIAGNOSIS — Z20822 Contact with and (suspected) exposure to covid-19: Secondary | ICD-10-CM | POA: Diagnosis not present

## 2021-09-30 DIAGNOSIS — I4892 Unspecified atrial flutter: Secondary | ICD-10-CM | POA: Diagnosis present

## 2021-09-30 DIAGNOSIS — R1032 Left lower quadrant pain: Secondary | ICD-10-CM | POA: Diagnosis not present

## 2021-09-30 DIAGNOSIS — I10 Essential (primary) hypertension: Secondary | ICD-10-CM | POA: Diagnosis present

## 2021-09-30 DIAGNOSIS — Z28310 Unvaccinated for covid-19: Secondary | ICD-10-CM

## 2021-09-30 DIAGNOSIS — Z8249 Family history of ischemic heart disease and other diseases of the circulatory system: Secondary | ICD-10-CM

## 2021-09-30 DIAGNOSIS — K219 Gastro-esophageal reflux disease without esophagitis: Secondary | ICD-10-CM | POA: Diagnosis present

## 2021-09-30 HISTORY — DX: Do not resuscitate: Z66

## 2021-09-30 HISTORY — DX: Chronic combined systolic (congestive) and diastolic (congestive) heart failure: I50.42

## 2021-09-30 HISTORY — DX: Acute kidney failure, unspecified: N17.9

## 2021-09-30 LAB — CBC WITH DIFFERENTIAL/PLATELET
Abs Immature Granulocytes: 0.01 10*3/uL (ref 0.00–0.07)
Basophils Absolute: 0 10*3/uL (ref 0.0–0.1)
Basophils Relative: 0 %
Eosinophils Absolute: 0 10*3/uL (ref 0.0–0.5)
Eosinophils Relative: 0 %
HCT: 38.3 % (ref 36.0–46.0)
Hemoglobin: 12.1 g/dL (ref 12.0–15.0)
Immature Granulocytes: 0 %
Lymphocytes Relative: 9 %
Lymphs Abs: 0.6 10*3/uL — ABNORMAL LOW (ref 0.7–4.0)
MCH: 28.3 pg (ref 26.0–34.0)
MCHC: 31.6 g/dL (ref 30.0–36.0)
MCV: 89.5 fL (ref 80.0–100.0)
Monocytes Absolute: 0.8 10*3/uL (ref 0.1–1.0)
Monocytes Relative: 12 %
Neutro Abs: 5 10*3/uL (ref 1.7–7.7)
Neutrophils Relative %: 79 %
Platelets: 189 10*3/uL (ref 150–400)
RBC: 4.28 MIL/uL (ref 3.87–5.11)
RDW: 16.3 % — ABNORMAL HIGH (ref 11.5–15.5)
WBC: 6.3 10*3/uL (ref 4.0–10.5)
nRBC: 0 % (ref 0.0–0.2)

## 2021-09-30 LAB — URINALYSIS, MICROSCOPIC (REFLEX)

## 2021-09-30 LAB — URINALYSIS, ROUTINE W REFLEX MICROSCOPIC
Bilirubin Urine: NEGATIVE
Glucose, UA: NEGATIVE mg/dL
Hgb urine dipstick: NEGATIVE
Ketones, ur: NEGATIVE mg/dL
Nitrite: NEGATIVE
Protein, ur: NEGATIVE mg/dL
Specific Gravity, Urine: 1.025 (ref 1.005–1.030)
pH: 6 (ref 5.0–8.0)

## 2021-09-30 LAB — BASIC METABOLIC PANEL
Anion gap: 10 (ref 5–15)
BUN: 18 mg/dL (ref 8–23)
CO2: 24 mmol/L (ref 22–32)
Calcium: 9.2 mg/dL (ref 8.9–10.3)
Chloride: 99 mmol/L (ref 98–111)
Creatinine, Ser: 1.27 mg/dL — ABNORMAL HIGH (ref 0.44–1.00)
GFR, Estimated: 44 mL/min — ABNORMAL LOW (ref >60)
Glucose, Bld: 117 mg/dL — ABNORMAL HIGH (ref 70–99)
Potassium: 4.6 mmol/L (ref 3.5–5.1)
Sodium: 133 mmol/L — ABNORMAL LOW (ref 135–145)

## 2021-09-30 LAB — RESP PANEL BY RT-PCR (FLU A&B, COVID) ARPGX2
Influenza A by PCR: NEGATIVE
Influenza B by PCR: NEGATIVE
SARS Coronavirus 2 by RT PCR: NEGATIVE

## 2021-09-30 LAB — TROPONIN I (HIGH SENSITIVITY)
Troponin I (High Sensitivity): 28 ng/L — ABNORMAL HIGH (ref <18)
Troponin I (High Sensitivity): 23 ng/L — ABNORMAL HIGH (ref <18)

## 2021-09-30 LAB — HEPATIC FUNCTION PANEL
ALT: 17 U/L (ref 0–44)
AST: 26 U/L (ref 15–41)
Albumin: 4.1 g/dL (ref 3.5–5.0)
Alkaline Phosphatase: 84 U/L (ref 38–126)
Bilirubin, Direct: 0.3 mg/dL — ABNORMAL HIGH (ref 0.0–0.2)
Indirect Bilirubin: 1 mg/dL — ABNORMAL HIGH (ref 0.3–0.9)
Total Bilirubin: 1.3 mg/dL — ABNORMAL HIGH (ref 0.3–1.2)
Total Protein: 7 g/dL (ref 6.5–8.1)

## 2021-09-30 LAB — PROTIME-INR
INR: 3.1 — ABNORMAL HIGH (ref 0.8–1.2)
Prothrombin Time: 32.2 seconds — ABNORMAL HIGH (ref 11.4–15.2)

## 2021-09-30 LAB — BRAIN NATRIURETIC PEPTIDE: B Natriuretic Peptide: 2815.3 pg/mL — ABNORMAL HIGH (ref 0.0–100.0)

## 2021-09-30 MED ORDER — ALPRAZOLAM 0.25 MG PO TABS
0.5000 mg | ORAL_TABLET | Freq: Two times a day (BID) | ORAL | Status: DC | PRN
Start: 2021-09-30 — End: 2021-10-01
  Administered 2021-09-30 – 2021-10-01 (×2): 0.5 mg via ORAL
  Filled 2021-09-30 (×2): qty 2

## 2021-09-30 MED ORDER — ACETAMINOPHEN 650 MG RE SUPP: 650.0000 mg | Freq: Four times a day (QID) | RECTAL | Status: DC | PRN

## 2021-09-30 MED ORDER — BISACODYL 5 MG PO TBEC
5.0000 mg | DELAYED_RELEASE_TABLET | Freq: Every day | ORAL | Status: DC | PRN
  Administered 2021-10-02: 18:00:00 5 mg via ORAL
  Filled 2021-09-30: qty 1

## 2021-09-30 MED ORDER — DILTIAZEM HCL ER 240 MG PO CP24: 240.0000 mg | ORAL_CAPSULE | Freq: Every day | ORAL | Status: DC

## 2021-09-30 MED ORDER — OXYCODONE HCL 5 MG PO TABS
5.0000 mg | ORAL_TABLET | ORAL | Status: DC | PRN
  Administered 2021-10-01 – 2021-10-05 (×9): 5 mg via ORAL
  Filled 2021-09-30 (×10): qty 1

## 2021-09-30 MED ORDER — DOCUSATE SODIUM 100 MG PO CAPS
100.0000 mg | ORAL_CAPSULE | Freq: Two times a day (BID) | ORAL | Status: DC
  Administered 2021-09-30 – 2021-10-06 (×11): 100 mg via ORAL
  Filled 2021-09-30 (×12): qty 1

## 2021-09-30 MED ORDER — BACLOFEN 10 MG PO TABS
10.0000 mg | ORAL_TABLET | Freq: Three times a day (TID) | ORAL | Status: DC | PRN
  Filled 2021-09-30 (×2): qty 1

## 2021-09-30 MED ORDER — FUROSEMIDE 10 MG/ML IJ SOLN
40.0000 mg | Freq: Once | INTRAMUSCULAR | Status: AC
  Administered 2021-09-30: 40 mg via INTRAVENOUS
  Filled 2021-09-30: qty 4

## 2021-09-30 MED ORDER — ACETAMINOPHEN 325 MG PO TABS: 650.0000 mg | ORAL_TABLET | Freq: Four times a day (QID) | ORAL | Status: DC | PRN

## 2021-09-30 MED ORDER — TRAZODONE HCL 50 MG PO TABS
25.0000 mg | ORAL_TABLET | Freq: Every evening | ORAL | Status: DC | PRN
  Administered 2021-10-04: 25 mg via ORAL
  Filled 2021-09-30: qty 1

## 2021-09-30 MED ORDER — ATORVASTATIN CALCIUM 80 MG PO TABS
80.0000 mg | ORAL_TABLET | Freq: Every day | ORAL | Status: DC
  Administered 2021-09-30 – 2021-10-05 (×6): 80 mg via ORAL
  Filled 2021-09-30 (×6): qty 1

## 2021-09-30 MED ORDER — SODIUM CHLORIDE 0.9% FLUSH
3.0000 mL | Freq: Two times a day (BID) | INTRAVENOUS | Status: DC
  Administered 2021-09-30 – 2021-10-02 (×4): 3 mL via INTRAVENOUS

## 2021-09-30 MED ORDER — ZOLPIDEM TARTRATE 5 MG PO TABS
5.0000 mg | ORAL_TABLET | Freq: Every day | ORAL | Status: DC
  Administered 2021-09-30 – 2021-10-05 (×5): 5 mg via ORAL
  Filled 2021-09-30 (×6): qty 1

## 2021-09-30 MED ORDER — POLYETHYLENE GLYCOL 3350 17 G PO PACK: 17.0000 g | PACK | Freq: Every day | ORAL | Status: DC | PRN

## 2021-09-30 MED ORDER — MORPHINE SULFATE (PF) 2 MG/ML IV SOLN: 2.0000 mg | INTRAVENOUS | Status: DC | PRN

## 2021-09-30 MED ORDER — HYDRALAZINE HCL 20 MG/ML IJ SOLN: 5.0000 mg | INTRAMUSCULAR | Status: DC | PRN

## 2021-09-30 MED ORDER — LEVOTHYROXINE SODIUM 75 MCG PO TABS
75.0000 ug | ORAL_TABLET | Freq: Every day | ORAL | Status: DC
  Administered 2021-10-01 – 2021-10-06 (×6): 75 ug via ORAL
  Filled 2021-09-30 (×6): qty 1

## 2021-09-30 MED ORDER — FUROSEMIDE 10 MG/ML IJ SOLN
40.0000 mg | Freq: Two times a day (BID) | INTRAMUSCULAR | Status: DC
  Administered 2021-09-30 – 2021-10-04 (×8): 40 mg via INTRAVENOUS
  Filled 2021-09-30 (×9): qty 4

## 2021-09-30 NOTE — Progress Notes (Signed)
Patient was unable to receive CT scan today due to increased shortness of breath and urinary retention. Obvious swelling to the lower extremities. Dr. Eden Emms made aware and agrees with patient going to the emergency room. Patient will notify her family.

## 2021-09-30 NOTE — ED Provider Notes (Signed)
Emergency Medicine Provider Triage Evaluation Note  [name] [name] , a 75 y.o. female  was evaluated in triage.  Pt complains of worsening shortness of breath and leg swelling over the past several days. She recently had Influenza. Patient states that she has not urinated since yesterday except in very small amounts. She states that she does not really feel the urge to urinate. She also reports constipation. She is passing a small amount of gas. She has LLQ abdominal pain. No n/v. She was supposed to have an outpatient CT scan but they cancelled it because she has had decreased urination frequency.  Review of Systems  Positive:  Negative:   Physical Exam  BP (!) 141/56 (BP Location: Right Arm)    Pulse 70    Temp 97.7 F (36.5 C) (Oral)    Resp (!) 22    SpO2 92%  Gen:   Awake, no distress   Resp:  Normal effort  MSK:   Moves extremities without difficulty  Other:  Bilateral leg swelling 2 + pitting. Abdomen soft, mild tenderness to lower quadrants.   Medical Decision Making  Medically screening exam initiated at 10:57 AM.  Appropriate orders placed.  [name] [name] was informed that the remainder of the evaluation will be completed by another provider, this initial triage assessment does not replace that evaluation, and the importance of remaining in the ED until their evaluation is complete.     Claudie Leach, PA-C 09/30/21 1100    Mancel Bale, MD 09/30/21 1116

## 2021-09-30 NOTE — H&P (Signed)
History and Physical    [name] [name]:[id number] DOB: Jul 17, 1946 DOA: 09/30/2021  PCP: Marylen Ponto, MD Consultants:  Excell Seltzer - cardiology Patient coming from:  Home - lives alone; NOK: Daughter, Jeraldine Loots, [phone]  Chief Complaint: SOB and urinary retention  HPI: [name] [name] is a 75 y.o. 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  She was previously admitted from 11/8-12 with influenza A and CHF exacerbation.  She reports that she did get better after that hospitalization but then got worse again.  She is planned for TAVR and came in today for CT study but was too SOB with orthopnea and marked LE edema and so was sent to the ER for evaluation.  She is not normally on home O2 and current O2 sats are 92-93% on 4L [place] O2.  She does tell the same story multiple times about multiple issues and is mildly tangential.    ED Course: Heart failure exacerbation.  H/o rheumatic heart disease.  Going for TAVR eval today but reported more SOB and oliguria.  Creatinine at baseline.  Diuresing well here.  BNP elevated, troponin likely demand.  On 2L to keep sats >80%.  Review of Systems: As per HPI; otherwise review of systems reviewed and negative.    COVID Vaccine Status:  None  Past Medical History:  Diagnosis Date   Anxiety    Aortic aneurysm (HCC)    Atrial arrhythmia    With prior history of atrial flutter ablation   Bradycardia    s/p pacemaker implantation   CONGESTIVE HEART FAILURE 07/31/2010   Qualifier: Diagnosis of  By: Graciela Husbands, MD, Susie Cassette    GERD (gastroesophageal reflux disease)    Hypertension    Hypothyroidism    Long Q-T syndrome    Myocardial infarct (HCC) 11/20/2011   Stent placement for 100% blockage of LAD   PACEMAKER, PERMANENT  07/31/2010   Qualifier: Diagnosis of  By: Graciela Husbands, MD, Susie Cassette    Rheumatic heart disease    With mitral valve replacement, tricuspid regurgitation moderate to severe, significant left atrial enlargment   TRICUSPID REGURGITATION 12/23/2010   Qualifier: Diagnosis of  By: Graciela Husbands, MD, Susie Cassette    Unspecified essential hypertension 07/31/2010   Qualifier: Diagnosis of  By: Graciela Husbands, MD, [facility], Ty Hilts     Past Surgical History:  Procedure Laterality Date   APPENDECTOMY     CARDIAC ELECTROPHYSIOLOGY STUDY AND ABLATION     Stent and pacemaker placement   CHOLECYSTECTOMY     CORONARY ANGIOPLASTY WITH STENT PLACEMENT Left 09/13/2015   Dr. Josefa Half at [facility], LAD stent   MITRAL VALVE REPLACEMENT  2001   mechanical valve   OOPHORECTOMY     PACEMAKER INSERTION  11/20/2007   Dual chamber pacemaker implantation with rapid ventricular pacing   SALPINGECTOMY Bilateral    TONSILLECTOMY      Social History   Socioeconomic History   Marital status: Single    Spouse name: Not on file   Number of children: Not on file   Years of education: Not on file   Highest education level: Not on file  Occupational History   Not on file  Tobacco Use   Smoking status: Never   Smokeless tobacco: Never  Substance and Sexual Activity   Alcohol use: Never  Drug use: Never   Sexual activity: Not on file  Other Topics Concern   Not on file  Social History Narrative   Not on file   Social Determinants of Health   Financial Resource Strain: Not on file  Food Insecurity: Not on file  Transportation Needs: Not on file  Physical Activity: Not on file  Stress: Not on file  Social Connections: Not on file  Intimate Partner Violence: Not on file    Allergies  Allergen Reactions   Atenolol Other (See Comments)    HR and pulse dropped   Metoprolol Succinate [Metoprolol] Other (See Comments)    HR and pulse dropped   Baclofen Anxiety and Other (See Comments)    Makes the  patient feel anxious the day after taking this   Latex Rash    Family History  Problem Relation Age of Onset   Leukemia Mother    Stroke Mother    Heart disease Father    Stroke Maternal Grandmother    Heart attack Paternal Grandmother     Prior to Admission medications   Medication Sig Start Date End Date Taking? Authorizing Provider  albuterol (VENTOLIN HFA) 108 (90 Base) MCG/ACT inhaler Inhale 1 puff into the lungs every 6 (six) hours as needed for shortness of breath. Patient not taking: No sig reported 03/21/14   [provider]  ALPRAZolam Duanne Moron) 1 MG tablet Take 1 mg by mouth in the morning and at bedtime.    [provider]  atorvastatin (LIPITOR) 80 MG tablet Take 80 mg by mouth at bedtime. 11/27/11   [provider]  baclofen (LIORESAL) 20 MG tablet Take 10-20 mg by mouth every 8 (eight) hours as needed for muscle spasms. 01/03/21   [provider]  candesartan (ATACAND) 8 MG tablet Take 4 mg by mouth daily. 06/02/21   [provider]  DILT-XR 240 MG 24 hr capsule Take 1 capsule (240 mg total) by mouth daily. 08/30/21   Mercy Riding, MD  furosemide (LASIX) 20 MG tablet Take 20 mg by mouth 2 (two) times daily. 09/21/21   [provider]  furosemide (LASIX) 40 MG tablet Take 40 mg by mouth daily.    [provider]  hydrochlorothiazide (HYDRODIURIL) 25 MG tablet Take 25 mg by mouth daily. 09/20/21   [provider]  levothyroxine (SYNTHROID) 75 MCG tablet Take 75 mcg by mouth daily. 03/12/21 08/29/21  [provider]  Multiple Vitamin (MULTIVITAMIN WITH MINERALS) TABS tablet Take 1 tablet by mouth daily. 08/30/21   Mercy Riding, MD  nitroGLYCERIN (NITROSTAT) 0.4 MG SL tablet Place 0.4 mg under the tongue every 5 (five) minutes as needed for chest pain. 01/14/21   [provider]  potassium chloride SA (K-DUR,KLOR-CON) 20 MEQ tablet Take 40 mEq by mouth daily.    [provider]  warfarin  (COUMADIN) 5 MG tablet Take 2 tablets (10 mg total) by mouth daily for 1 day, THEN 1.5 tablets (7.5 mg total) daily. 08/30/21 11/29/21  Mercy Riding, MD  zolpidem (AMBIEN) 10 MG tablet Take 10 mg by mouth at bedtime. 11/27/11   [provider]    Physical Exam: Vitals:   09/30/21 1400 09/30/21 1415 09/30/21 1430 09/30/21 1739  BP: (!) 117/45 (!) 141/51 113/77 (!) 148/77  Pulse: 63 64 67 70  Resp:    20  Temp:      TempSrc:      SpO2: 94% 94% 93% 91%     General:  Appears calm and comfortable and is in NAD Eyes:  PERRL, EOMI, normal lids, iris ENT:  grossly normal hearing, lips & tongue, mmm Neck:  no LAD, masses or thyromegaly Cardiovascular:  RRR, no r/g, 4/6 systolic murmur. 2+ LE edema.  Respiratory:   Mild bibasilar crackles..  Mildly increased respiratory effort.  O2 sats 92-93% on 4L [place] O2 Abdomen:  soft, NT, ND Skin:  no rash or induration seen on limited exam Musculoskeletal:  grossly normal tone BUE/BLE, good ROM, no bony abnormality Psychiatric:  grossly normal mood and affect, speech fluent and appropriate, mild tangentiality with perseveration, ?mild cognitive impairment Neurologic:  CN 2-12 grossly intact, moves all extremities in coordinated fashion    Radiological Exams on Admission: Independently reviewed - see discussion in A/P where applicable  DG Chest 2 View  Result Date: 09/30/2021 CLINICAL DATA:  Shortness of breath EXAM: CHEST - 2 VIEW COMPARISON:  08/26/2021 FINDINGS: Cardiomegaly status post median sternotomy with mitral annulus prosthesis. Left chest multi lead pacer. Small, layering bilateral pleural effusions and associated atelectasis or consolidation. Exaggerated thoracic kyphosis. Disc degenerative disease of the thoracic spine. IMPRESSION: 1.  Cardiomegaly. 2. Small, layering bilateral pleural effusions and associated atelectasis or consolidation. Electronically Signed   By: Delanna Ahmadi M.D.   On: 09/30/2021 11:27   DG Abdomen 1  View  Result Date: 09/30/2021 CLINICAL DATA:  Shortness of breath, LEFT lower quadrant abdominal pain, unable to urinate since yesterday, constipation EXAM: ABDOMEN - 1 VIEW COMPARISON:  02/27/2014 FINDINGS: Nonobstructive bowel gas pattern. No bowel dilatation or bowel wall thickening. Bones demineralized with degenerative facet disease changes lower lumbar spine. Surgical clips RIGHT upper quadrant. No urinary tract calcifications. IMPRESSION: No acute abnormalities. Electronically Signed   By: Lavonia Dana M.D.   On: 09/30/2021 11:29    EKG: Independently reviewed.  NSR with rate 72; prolonged QTc 569; LBBB   Labs on Admission: I have personally reviewed the available labs and imaging studies at the time of the admission.  Pertinent labs:   BUN 18/Creatinine 1.27/GFR 44; 20/0.94/>60 on 11/11 BNP 2815.3; 1295.2 on 11/8 HS troponin 28, 23 Normal CBC COVID/flu negative UA: small LE   Assessment/Plan Principal Problem:   Acute on chronic combined systolic and diastolic CHF (congestive heart failure) (HCC) Active Problems:   Essential hypertension   PACEMAKER, PERMANENT   Atrial arrhythmia   Hypothyroidism   Aortic stenosis   CKD (chronic kidney disease) stage 3, GFR 30-59 ml/min (HCC)   AKI (acute kidney injury) ([place])   DNR (do not resuscitate)   Acute on chronic combined CHF -Patient with known h/o chronic systolic CHF presenting with worsening SOB and hypoxia, also with edema and orthopnea -CXR consistent with mild pulmonary edema -Significantly elevated BNP compared to prior -With elevated BNP and abnl CXR, acute decompensated CHF seems probable as diagnosis -Will admit, as per the Emergency HF Mortality Risk Grade.  The patient has: severe pulmonary edema requiring new O2 therapy -07/2021 echo with EF 35-40% and grade 3 diastolic dysfunction; will not repeat -CHF order set utilized -Cardiology consulted -Was given Lasix 40 mg x 1 in ER and will repeat with 40 mg IV  BID -Continue [place] O2 for now -Mildly worsened kidney function at this time, will follow -Mildly elevated HS troponin is likely related to demand ischemia; doubt ACS based on symptoms  Valvular heart disease -She is s/p MVR, on Coumadin; pharmacy to dose -She is undergoing evaluation for TAVR  CAD -s/p stent -No current obvious c/o chest  pain  Afib -Has pacemaker -Rate controlled with Dilt -Continue Coumadin - pharmacy to dose  HTN -Hold candesartan due to marginal BP and mild AKI -Continue Diltiazem -Will also add prn hydralazine  HLD -Continue Lipitor  AKI on Stage 3a CKD -Slightly worse than baseline, will trend and attempt to avoid nephrotoxic meds  Hypothyroidism -Continue Synthroid  Psych -Continue Xanax, Ambien -Patient appeared to have mild cognitive impairment at the time of my evaluation -Suggest outpatient evaluation  DNR -I have discussed code status with the patient and the patient would not desire resuscitation and would prefer to die a natural death should that situation arise. -She will need a gold out of facility DNR form at the time of discharge    Note: This patient has been tested and is negative for the novel coronavirus COVID-19. She has NOT been vaccinated against COVID-19.    Level of care: Telemetry Cardiac  DVT prophylaxis: Coumadin Code Status:  DNR - confirmed with patient Family Communication: None present; she declined to have me call family at the time of admission Disposition Plan:  The patient is from: home  Anticipated d/c is to: home, possibly with [facility] services  Anticipated d/c date will depend on clinical response to treatment, likely 2-4 days  Patient is currently: acutely ill Consults called: Cardiology; [facility] team; PT/OT; heart failure navigator; nutrition Admission status: Admit - It is my clinical opinion that admission to INPATIENT is reasonable and necessary because this patient will require at least 2 midnights in the  hospital to treat this condition based on the medical complexity of the problems presented.  Given the aforementioned information, the predictability of an adverse outcome is felt to be significant.     Karmen Bongo MD [facility]   How to contact the [facility] Attending or Consulting provider [place] or covering provider during after hours [place], for this patient?  Check the care team in [facility] and look for a) attending/consulting TR[facility] provider listed and b) the [facility] team listed Log into [email or web address] and use [place]'s universal password to access. If you do not have the password, please contact the hospital operator. Locate the [facility] provider you are looking for under [facility] and page to a number that you can be directly reached. If you still have difficulty reaching the provider, please page the [facility] (Director on Call) for the Hospitalists listed on amion for assistance.   09/30/2021, 6:02 PM

## 2021-09-30 NOTE — Progress Notes (Addendum)
ANTICOAGULATION CONSULT NOTE - Initial Consult  Pharmacy Consult for Heparin Indication:  Mechanical Valve  Allergies  Allergen Reactions   Atenolol Other (See Comments)    HR and pulse dropped   Metoprolol Succinate [Metoprolol] Other (See Comments)    HR and pulse dropped   Baclofen Anxiety and Other (See Comments)    Makes the patient feel anxious the day after taking this   Latex Rash    Patient Measurements: Vital Signs: Temp: 97.7 F (36.5 C) (12/13 1037) Temp Source: Oral (12/13 1037) BP: 113/77 (12/13 1430) Pulse Rate: 67 (12/13 1430)  Labs: Recent Labs    09/30/21 1103 09/30/21 1302 09/30/21 1557  HGB 12.1  --   --   HCT 38.3  --   --   PLT 189  --   --   LABPROT  --   --  32.2*  INR  --   --  3.1*  CREATININE 1.27*  --   --   TROPONINIHS 28* 23*  --     CrCl cannot be calculated (Unknown ideal weight.).   Medical History: Past Medical History:  Diagnosis Date   Anxiety    Aortic aneurysm (HCC)    Atrial arrhythmia    With prior history of atrial flutter ablation   Bradycardia    s/p pacemaker implantation   CONGESTIVE HEART FAILURE 07/31/2010   Qualifier: Diagnosis of  By: Graciela Husbands, MD, Susie Cassette    GERD (gastroesophageal reflux disease)    Hypertension    Hypothyroidism    Long Q-T syndrome    Myocardial infarct (HCC) 11/20/2011   Stent placement for 100% blockage of LAD   PACEMAKER, 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   Qualifier: Diagnosis of  By: Graciela Husbands, MD, Susie Cassette    Rheumatic heart disease    With mitral valve replacement, tricuspid regurgitation moderate to severe, significant left atrial enlargment   TRICUSPID REGURGITATION 12/23/2010   Qualifier: Diagnosis of  By: Graciela Husbands, MD, Susie Cassette    Unspecified essential hypertension 07/31/2010   Qualifier: Diagnosis of  By: Graciela Husbands, MD, Susie Cassette     Medications:  (Not in a hospital admission)  Scheduled:   atorvastatin  80 mg Oral QHS   [START ON 10/01/2021]  diltiazem  240 mg Oral Daily   docusate sodium  100 mg Oral BID   furosemide  40 mg Intravenous BID   [START ON 10/01/2021] levothyroxine  75 mcg Oral QAC breakfast   sodium chloride flush  3 mL Intravenous Q12H   zolpidem  5 mg Oral QHS   Infusions:  PRN: acetaminophen **OR** acetaminophen, ALPRAZolam, baclofen, bisacodyl, hydrALAZINE, morphine injection, oxyCODONE, polyethylene glycol, traZODone  Assessment: Patient is a 38 yof with a history of chronic systolic CHF; severe AS undergoing evaluation for TAVR; 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 Patient presenting with urinary retention, SOB, and weakness. Heparin per pharmacy consult placed for mechanical valve.  Patient taking warfarin PTA. Home dose is 7.5 mg daily. Last dose 12/11. Per patient INR was > 5 at that time.  INR today is 3.1 which is therapeutic.  Goal of Therapy:  Heparin level 0.3-0.7 units/ml (INR 2.5-3.5 for warfarin) Monitor platelets by anticoagulation protocol: Yes  Plan:  Start heparin when INR < 2.2 per Cardiology Re-check INR in AM Continue to monitor H&H and platelets  Delmar Landau, PharmD, BCPS 09/30/2021 5:44 PM ED Clinical Pharmacist -  [phone]

## 2021-09-30 NOTE — Consult Note (Signed)
Cardiology Consultation:   Patient ID: [name] [name] MRN: [id number]; DOB: [birth year]  Admit date: 09/30/2021 Date of Consult: 09/30/2021  PCP:  Ronita Hipps, MD   [facility] Providers Cardiologist:  Sherren Mocha, MD   08/22/2021 Dr. Bishop Limbo [facility] cardiology 07/29/2021  Patient Profile:   [name] [name] is a 75 y.o. female with a hx of rheumatic heart disease s/p mech MV on warfarin, long QT w/ bradycardia s/p PPM, CHF, HTN, AS, atrial flutter with a history of ablation and recurrent atrial arrhythmias, who is being seen 09/30/2021 for the evaluation of CHF at the request of Dr Lorin Mercy.  History of Present Illness:   [name] [name] was seen by Dr Burt Knack for AS on 11/04.  Volume status good, weight 123 pounds.  According to the patient, her normal weight is 116-118 pounds.  She was admitted to the hospital 11/08-11/09/2021 for chest pressure, shortness of breath, diagnosed with acute hypoxic respiratory failure due to influenza A and CHF exacerbation.  Tamiflu had been tried but was not effective as an outpatient.  BNP 1295.  Weight at discharge 114 pounds.  She was seen by cardiology that admission, ambulating w/ O2.  She was in A. fib, rate controlled with diltiazem 120 mg twice daily, continue warfarin.  Since discharge, she has had a hard time emotionally.  Her brother died and she had to arrange the funeral and do many other things that were both physically and emotionally stressful.  Her eating habits were also altered, although she tried to eat a low-sodium diet.  She does not weigh herself daily, but says she weighs almost every day.  She was not aware of any increase in her weight.  She noticed lower extremity edema starting several days ago.  She also developed increased dyspnea on exertion, orthopnea, and PND.  Today, she came to the hospital for a scan related to her aortic stenosis.  She was noted to be short of breath and volume overloaded, and was  sent to the ER.  In the ER, she has had some urine output from the Lasix, but is still significantly short of breath at rest.  No chest pain or palpitations.    Past Medical History:  Diagnosis Date   Anxiety    Aortic aneurysm [facility])    Atrial arrhythmia    With prior history of atrial flutter ablation   Bradycardia    s/p pacemaker implantation   CONGESTIVE HEART FAILURE 07/31/2010   Qualifier: Diagnosis of  By: Caryl Comes, MD, Remus Blake    GERD (gastroesophageal reflux disease)    Hypertension    Hypothyroidism    Long Q-T syndrome    Myocardial infarct ([place]) 11/20/2011   Stent placement for 100% blockage of LAD   PACEMAKER, 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   Qualifier: Diagnosis of  By: Caryl Comes, MD, Remus Blake    Rheumatic heart disease    With mitral valve replacement, tricuspid regurgitation moderate to severe, significant left atrial enlargment   TRICUSPID REGURGITATION 12/23/2010   Qualifier: Diagnosis of  By: Caryl Comes, MD, Remus Blake    Unspecified essential hypertension 07/31/2010   Qualifier: Diagnosis of  By: Caryl Comes, MD, [facility], Mack Guise     Past Surgical History:  Procedure Laterality Date   APPENDECTOMY     CARDIAC ELECTROPHYSIOLOGY STUDY AND ABLATION     Stent and pacemaker placement   CHOLECYSTECTOMY     CORONARY ANGIOPLASTY WITH STENT PLACEMENT Left 09/13/2015   Dr. Gwenith Spitz at [facility],  LAD stent   OOPHORECTOMY     PACEMAKER INSERTION  11/20/2007   Dual chamber pacemaker implantation with rapid ventricular pacing   SALPINGECTOMY Bilateral    TONSILLECTOMY       Home Medications:  Prior to Admission medications   Medication Sig Start Date End Date Taking? Authorizing Provider  albuterol (VENTOLIN HFA) 108 (90 Base) MCG/ACT inhaler Inhale 2 puffs into the lungs every 6 (six) hours as needed for shortness of breath. 03/21/14  Yes [provider]  ALPRAZolam Prudy Feeler) 1 MG tablet Take 0.5-1 mg by mouth 2 (two) times daily as needed  for anxiety.   Yes [provider]  atorvastatin (LIPITOR) 80 MG tablet Take 80 mg by mouth at bedtime. 11/27/11  Yes [provider]  baclofen (LIORESAL) 20 MG tablet Take 10 mg by mouth every 8 (eight) hours as needed for muscle spasms. 01/03/21  Yes [provider]  candesartan (ATACAND) 8 MG tablet Take 4 mg by mouth daily as needed ("as long as the Diastolic number is not too low"). 06/02/21  Yes [provider]  DILT-XR 240 MG 24 hr capsule Take 1 capsule (240 mg total) by mouth daily. 08/30/21  Yes Almon Hercules, MD  furosemide (LASIX) 20 MG tablet Take 40 mg by mouth 2 (two) times daily. 09/21/21  Yes [provider]  KLOR-CON M20 20 MEQ tablet Take 40 mEq by mouth See admin instructions. Crush and mix 40 mEq with applesauce and eat once a day   Yes [provider]  lactose free nutrition (BOOST PLUS) LIQD Take 237 mLs by mouth 2 (two) times daily.   Yes [provider]  levothyroxine (SYNTHROID) 75 MCG tablet Take 75 mcg by mouth daily before breakfast. 03/12/21 10/31/21 Yes [provider]  nitroGLYCERIN (NITROSTAT) 0.4 MG SL tablet Place 0.4 mg under the tongue every 5 (five) minutes as needed for chest pain. 01/14/21  Yes [provider]  warfarin (COUMADIN) 5 MG tablet Take 2 tablets (10 mg total) by mouth daily for 1 day, THEN 1.5 tablets (7.5 mg total) daily. Patient taking differently: Take 7.5 mg by mouth at bedtime unless INR is 2.5-3.5, then adjust as directed 08/30/21 11/29/21 Yes Gonfa, Boyce Medici, MD  zolpidem (AMBIEN) 10 MG tablet Take 5-10 mg by mouth at bedtime. 11/27/11  Yes [provider]    Inpatient Medications: Scheduled Meds:  atorvastatin  80 mg Oral QHS   [START ON 10/01/2021] diltiazem  240 mg Oral Daily   docusate sodium  100 mg Oral BID   furosemide  40 mg Intravenous BID   [START ON 10/01/2021] levothyroxine  75 mcg Oral QAC breakfast   sodium chloride flush  3 mL Intravenous Q12H    zolpidem  5 mg Oral QHS   Continuous Infusions:  PRN Meds: acetaminophen **OR** acetaminophen, ALPRAZolam, baclofen, bisacodyl, hydrALAZINE, morphine injection, oxyCODONE, polyethylene glycol, traZODone  Allergies:    Allergies  Allergen Reactions   Atenolol Other (See Comments)    HR and pulse dropped   Metoprolol Succinate [Metoprolol] Other (See Comments)    HR and pulse dropped   Baclofen Anxiety and Other (See Comments)    Makes the patient feel anxious the day after taking this   Latex Rash    Social History:   Social History   Socioeconomic History   Marital status: Single    Spouse name: Not on file   Number of children: Not on file   Years of education: Not on file  Highest education level: Not on file  Occupational History   Not on file  Tobacco Use   Smoking status: Never   Smokeless tobacco: Never  Substance and Sexual Activity   Alcohol use: Never   Drug use: Never   Sexual activity: Not on file  Other Topics Concern   Not on file  Social History Narrative   Not on file   Social Determinants of Health   Financial Resource Strain: Not on file  Food Insecurity: Not on file  Transportation Needs: Not on file  Physical Activity: Not on file  Stress: Not on file  Social Connections: Not on file  Intimate Partner Violence: Not on file    Family History:   Family History  Problem Relation Age of Onset   Leukemia Mother    Stroke Mother    Heart disease Father    Stroke Maternal Grandmother    Heart attack Paternal Grandmother      ROS:  Please see the history of present illness.  All other ROS reviewed and negative.     Physical Exam/Data:   Vitals:   09/30/21 1345 09/30/21 1400 09/30/21 1415 09/30/21 1430  BP: (!) 126/49 (!) 117/45 (!) 141/51 113/77  Pulse: 63 63 64 67  Resp:      Temp:      TempSrc:      SpO2: 94% 94% 94% 93%   No intake or output data in the 24 hours ending 09/30/21 1623 Last 3 Weights 08/30/2021 08/28/2021  08/26/2021  Weight (lbs) 114 lb 10.2 oz 117 lb 15.1 oz 120 lb  Weight (kg) 52 kg 53.5 kg 54.432 kg     There is no height or weight on file to calculate BMI.  General:  Well nourished, well developed, in moderate respiratory distress HEENT: normal Neck: JVD 10-12 cm Vascular: No carotid bruits; Distal pulses 2+ bilaterally Cardiac:  normal S1, S2; RRR; 3/6 murmur, valve click noted Lungs: Decreased breath sounds bases with Rales bilaterally, no wheezing, rhonchi  Abd: soft, nontender, no hepatomegaly  Ext: 1-2+ lower extremity edema Musculoskeletal:  No deformities, BUE and BLE strength weak but equal Skin: warm and dry  Neuro:  CNs 2-12 intact, no focal abnormalities noted Psych:  Normal affect   EKG:  The EKG was personally reviewed and demonstrates: AV pacing with heart rate 72 Telemetry:  Telemetry was personally reviewed and demonstrates: Mostly paced rhythm  Relevant CV Studies:  Echo: SUMMARY  The left ventricular size is normal.  There is moderate concentric left ventricular hypertrophy.  Left ventricular systolic function is moderately reduced.  LV ejection fraction = 35-40%.  Abnormal (paradoxical) septal motion consistent with RV pacemaker.  There are regional wall motion abnormalities as specified below.  There is severe hypokinesis and thinning of the anterior,  anteroseptal, inferoseptal territory from mid to apex.  The right ventricle is normal in size and function.  The left atrium is severely dilated.  Diffuse thickening of the aortic valve with restricted cusp opening.  There is critically severe aortic stenosis.  There is moderate aortic regurgitation.  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  There is moderate tricuspid regurgitation. Moderate pulmonary  hypertension.  The aortic Sinus(es) of Valsalva are borderline dilated.  The IVC is normal in size with an inspiratory collapse of greater then  50%, suggesting normal right atrial pressure.  There is no  pericardial effusion.  Compared to prior study dated 12/30/20, AS is now critically severe.   -  FINDINGS:  LEFT  VENTRICLE  The left ventricular size is normal. Upper septal hypertrophy (sigmoid  septum), normal variant. There is moderate concentric left ventricular  hypertrophy. Left ventricular systolic function is moderately reduced.  LV ejection fraction = 35-40%. LV Global L Strain =-8.3%. There is  severe [Grade III] diastolic dysfunction [restrictive filling  pattern], with elevated left atrial pressure. Mitral inflow  deceleration timeAbnormal<150 msec. Consistent with restrictive  physiology.. Abnormal (paradoxical) septal motion consistent with RV  pacemaker. There are regional wall motion abnormalities as specified  below. There is severe hypokinesis and thinning of the anterior,  anteroseptal, inferoseptal territory from mid to apex.   -  RIGHT VENTRICLE  The right ventricle is normal in size and function.   LEFT ATRIUM  The left atrium is severely dilated. LAVI 111.0 ml/m2.   RIGHT ATRIUM  Right atrial size is normal.  -  AORTIC VALVE  The aortic valve is trileaflet. Diffuse thickening of the aortic valve  with restricted cusp opening. There is critically severe aortic  stenosis. There is moderate aortic regurgitation. The calculated  aortic valve area using the continuity equation is 0.4 cm2. Aortic  valve mean pressure gradient is 53 mmHg. The peak aortic valve  velocity 449 cm/s.  -  MITRAL VALVE  There is no mitral regurgitation noted. The mean gradient across the  mitral valve is 3.5 mmHg. The heart rate for the mean mitral valve  gradient is 61 BPM. 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  -  TRICUSPID VALVE  Structurally normal tricuspid valve. There is moderate tricuspid  regurgitation. Estimated right ventricular systolic pressure is 50  mmHg. Moderate pulmonary hypertension.  -  PULMONIC VALVE  Structurally normal pulmonic valve. Trace pulmonic  valvular  regurgitation.  -  ARTERIES  The aortic Sinus(es) of Valsalva are borderline dilated. The ascending  aorta is normal size. Mild pulmonary artery dilation. Measures 3.2 cm.   -  VENOUS  Pulmonary venous flow pattern not well visualized. The IVC is normal  in size with an inspiratory collapse of greater then 50%, suggesting  normal right atrial pressure.  -  EFFUSION  There is no pericardial effusion.  -  -   MMode/2D Measurements & Calculations  IVSd: 1.7 cm       LA dim: 6.0 cm    ESV(MOD-sp4):  Ao sinus diam:  LVIDd: 5.2 cm      EDV(MOD-sp4):     75.2 ml        3.8 cm  LVPWd: 1.3 cm      121.0 ml          EDV(MOD-sp2):  LVIDs: 3.7 cm                        169.0 ml                                       ESV(MOD-sp2):                                       113.0 ml          _______________________________________________________________________  asc Aorta Diam:    LVOT diam: 2.0 cm SV(MOD-sp4):   EF A4C: 37.9 %  3.6 cm  45.8 ml                                       SI(MOD-sp4):                                       29.8 ml/m2          _______________________________________________________________________  LA area A2:        LA area A4:       LA ESV (BP):   LA ESV Index  40.9 cm2           41.2 cm2          171.0 ml       (A2C): 109.1 ml/m2           _______________________________________________________________________  LA ESV Index       LA ESV Index (BP):LA vol:        LA vol index:  (A4C): 111.0 ml/m2 111.0 ml/m2       183.4 ml       119.4 ml/m2           _______________________________________________________________________  SV A4C: 45.8 ml   Doppler Measurements & Calculations  MV E max vel:       MV V2 max:       MV P1/2t max MPN:TI(RWER):  138.0 cm/sec        166.8 cm/sec     162.4 cm/sec     45.3 ml  MV A max vel:       MV max PG:       MV P1/2t:        Ao V2 max:  59.2 cm/sec         11.1 mmHg        58.5 msec         491.2 cm/sec  MV E/A: 2.3         MV V2 mean:      MV dec time:     Ao max PG:  Med Peak E' Vel:    77.0 cm/sec      0.12 sec         96.5 mmHg  4.8 cm/sec          MV mean PG:                       Ao V2 mean:  Lat Peak E' Vel:    3.0 mmHg                          352.9 cm/sec  12.5 cm/sec         MV V2 VTI:                        Ao mean PG:  E/Lat E`: 11.0      37.9 cm                           57.4 mmHg  E/Med E`: 28.8      MVA(P1/2t):                       Ao V2 VTI:  3.8 cm2                           144.2 cm                      MVA(VTI): 1.2 cm2                 AVA (VTI):                                                        0.31 cm2          _______________________________________________________________________  AI max vel:         LV V1 VTI:       TR max vel:      RAP systole:  401.4 cm/sec        14.7 cm          342.1 cm/sec     3.0 mmHg  AI dec slope:                        TR max PG:  335.9 cm/sec2                        46.8 mmHg  AI P1/2t:                            RVSP(TR):  350.0 msec                           49.8 mmHg           _______________________________________________________________________  AS Dimensionless    AVAi(VTI)        SV index(LVOT):  Index (VTI): 0.10   cm^2/m^2:                                       29.5 ml/m2                      0.20 cm2    CTA Chest/Abd/Pelvis ([facility] study) - 03/09/21:  Great vessels: The great vessels are moderately tortuous, however normal in caliber and patent.   Thoracic aorta: Ectasia of the ascending thoracic aorta measuring up to 4 x 3.7 cm. The ascending thoracic aorta is normal in caliber. Atherosclerotic calcifications of the aortic valve and thoracic aorta.   Pulmonary arteries/veins: Main pulmonary artery is enlarged measuring up to 4 cm. Limited evaluation of the pulmonary veins due to contrast bolus.   Heart: Left atrial and left ventricular enlargement. Aortic valve and mitral valve  calcifications. Left chest wall AICD with leads in the right atrium and right ventricle. Coronary atherosclerosis. No pericardial effusion.   Abdominal aorta: Atherosclerotic calcifications of the abdominal aorta which is normal in caliber. Celiac, SMA and IMA are patent with mild ostial narrowing due to atherosclerosis. Single bilateral renal arteries with moderate left and mild right ostial narrowing due to atherosclerosis.   Right common iliac artery: 7.48mm  Right external iliac artery: 6.72mm  Right common femoral artery: 8.4 mm.  Right profunda femoral and  SFA: patent  Left common iliac artery: 9.8mm  Left external iliac artery: 7.22mm  Left common femoral artery: 8.1 mm  Left profunda femoral/SFA: patent      Cardiac CTA (03/09/21 - [facility]): Impressions:  - Calcified trileaflet aortic valve with restricted leaflet mobility.  Pre-TAVR measurements as below  - Well seated mechanical mitral valve with normal leaflet mobility  - Severely reduced left ventricular systolic function with aneurysmal  anteroapical segments  - Coronary artery disease without evidence of significant stenosis  including mid-LAD stent that appears patent  - Dilated ascending aorta and main pulmonary artery    Indication: 75 year old woman with history of rheumatic mitral  regurgitation s/p mechanical MVR and now with severe aortic stenosis;  pre-TAVR evaluation  Contrast Used (ml): 75   Study Quality: good   Findings:  AORTIC MEASUREMENTS:   Measurement phase: 100 msec  Aortic annular major and orthogonal minor axis (mm) - 27.7 x 19.7 (mean  23.3)  Aortic annular perimeter (mm) - 76  Aortic annular area (square cm) - 4.3  Aortic annular calcification: Mild calcification at the right and left  coronary cusps  Sub-annular calcification: No calcification     Right coronary sinus of valsalva (mm) - 30  Left coronary sinus of valsalva (mm) - 33.2  Non-coronary sinus of valsalva (mm) - 35.1   Mean  sinotubular junction diameter (mm): 31.7  Sinotubular junction height from annular plane (mm): 21   Distance from base of right coronary artery to annular plane (mm) - 14.4  Distance from base of left coronary artery to annular plane (mm) - 11.7   Optimal fluoroscopic projection angle - LAO 7 CAU 4   Aortic Valve:    Trileaflet aortic valve with Moderate calcification with restricted  leaflet mobility.    Mitral Valve:  A 27 mm [employer] bileaflet mechanical valve is well seated in the mitral  position. There is normal excursion of the mechanical valve leaflets.   Coronary Arteries:  The coronary arteries arise in a normal position. There is co-dominance.   Left main: The left main coronary artery is a large size vessel that  bifurcates into the LAD and LCx. There is no evidence of plaque or  stenosis.   LAD: The left anterior descending artery is a normal size vessel that  gives off 3 diagonal branches. There is eccentric calcified plaque in the  proximal and mid segments without evidence of significant stenosis. The  mid LAD stent appears patent.   LCX: The left circumflex artery is a normal size vessel that gives off 2  obtuse marginal branches and a posterior lateral branch. There is  eccentric calcified plaque in the proximal and mid segments including  proximal OM1. There is no evidence of significant stenosis.   RCA: The right coronary artery is a dominant vessel that gives off a PDA  and PL branches. There is diffuse eccentric calcified plaque without  evidence of significant stenosis.    Cardiac Chambers:  The left ventricle is qualitatively normal in size with severely reduced  systolic function. The mid to apical septal, apical, and apical inferior  segments are thin, dyskinetic, and aneurysmal.  The right ventricle is qualitatively normal in size with probably normal  systolic function.  The left and right atria are qualitatively dilated in size.  Device leads  are visualized terminating in the right atrium and right  ventricle.   Pulmonary veins:  2 left and 2 right pulmonary veins return normally to  the left atrium.   Great Vessels:  The proximal ascending aorta is mildly dilated measuring 4.2 cm in maximal  dimension. The main pulmonary artery is dilated.   Laboratory Data:  High Sensitivity Troponin:   Recent Labs  Lab 09/30/21 1103 09/30/21 1302  TROPONINIHS 28* 23*     Chemistry Recent Labs  Lab 09/30/21 1103  NA 133*  K 4.6  CL 99  CO2 24  GLUCOSE 117*  BUN 18  CREATININE 1.27*  CALCIUM 9.2  GFRNONAA 44*  ANIONGAP 10    Recent Labs  Lab 09/30/21 1302  PROT 7.0  ALBUMIN 4.1  AST 26  ALT 17  ALKPHOS 84  BILITOT 1.3*   Lipids No results for input(s): CHOL, TRIG, HDL, LABVLDL, LDLCALC, CHOLHDL in the last 168 hours.  Hematology Recent Labs  Lab 09/30/21 1103  WBC 6.3  RBC 4.28  HGB 12.1  HCT 38.3  MCV 89.5  MCH 28.3  MCHC 31.6  RDW 16.3*  PLT 189   Thyroid No results for input(s): TSH, FREET4 in the last 168 hours.  BNP Recent Labs  Lab 09/30/21 1103  BNP 2,815.3*    DDimer No results for input(s): DDIMER in the last 168 hours.   Radiology/Studies:  DG Chest 2 View  Result Date: 09/30/2021 CLINICAL DATA:  Shortness of breath EXAM: CHEST - 2 VIEW COMPARISON:  08/26/2021 FINDINGS: Cardiomegaly status post median sternotomy with mitral annulus prosthesis. Left chest multi lead pacer. Small, layering bilateral pleural effusions and associated atelectasis or consolidation. Exaggerated thoracic kyphosis. Disc degenerative disease of the thoracic spine. IMPRESSION: 1.  Cardiomegaly. 2. Small, layering bilateral pleural effusions and associated atelectasis or consolidation. Electronically Signed   By: Delanna Ahmadi M.D.   On: 09/30/2021 11:27   DG Abdomen 1 View  Result Date: 09/30/2021 CLINICAL DATA:  Shortness of breath, LEFT lower quadrant abdominal pain, unable to urinate since yesterday,  constipation EXAM: ABDOMEN - 1 VIEW COMPARISON:  02/27/2014 FINDINGS: Nonobstructive bowel gas pattern. No bowel dilatation or bowel wall thickening. Bones demineralized with degenerative facet disease changes lower lumbar spine. Surgical clips RIGHT upper quadrant. No urinary tract calcifications. IMPRESSION: No acute abnormalities. Electronically Signed   By: Lavonia Dana M.D.   On: 09/30/2021 11:29     Assessment and Plan:   Acute on chronic combined CHF -Echocardiogram 07/22/2021 at [facility] showed EF 35-40%, strain -0000000, grade 3 diastolic dysfunction, severe left atrial dilatation, aortic valve mean gradient 53, peak velocity 449 cm/s, mean gradient across the mitral valve 3.5 mmHg -She has significant volume overload by exam, by CXR, and by elevated BNP - Prior to admission she was taking Lasix 40 mg daily, with an extra 40 mg as needed - She had not been taking the extra dose very often. - She does not think she had gained any weight, but admits that she does not weigh every single day. - She was given Lasix 40 mg IV in the ER and started on 40 mg IV twice daily - Track BMET, daily weights, intake/output      Risk Assessment/Risk Scores:       New York Heart Association (NYHA) Functional Class NYHA Class IV  CHA2DS2-VASc Score = 5   This indicates a 7.2% annual risk of stroke. The patient's score is based upon: CHF History: 1 HTN History: 1 Diabetes History: 0 Stroke History: 0 Vascular Disease History: 1 Age Score: 1 Gender Score: 1     For questions or updates, please contact [place]  Please consult [email or web address] for contact info under    Signed, Rosaria Ferries, PA-C  09/30/2021 4:23 PM

## 2021-09-30 NOTE — ED Provider Notes (Addendum)
[facility] EMERGENCY DEPARTMENT Provider Note   CSN: [id number] Arrival date & time: 09/30/21  1020     History Chief Complaint  Patient presents with   Urinary Retention   Shortness of Breath   Weakness    [name] [name] is a 75 y.o. female with history of rheumatic heart disease with history of aortic valve stenosis  who presents today with concern for decreased urinary output as well as progressively worsening shortness of breath for the last 3 days.  She states that she was post to have outpatient CT evaluation today for TAVR planning, however given her decreased urine output they would not perform the procedures and directed her to the emergency department.  She continues to pass flatus but states that she has moderate amount of constipation.  Has been several days since she had a full normal bowel movement though she does pass very small amounts recently.  Additional endorses progressively worsening of her bilateral lower extremity swelling.  She has urinated today but states not as much as she usually urinates with her Lasix at home.  She did take her 20 mg of Lasix this morning.  I have personally reviewed her medical records.  She has history of pacer insertion MI, tricuspid regurg, prolonged QT, rheumatic heart disease status post mitral valve replacement and she is anticoagulated on warfarin.  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   Qualifier: Diagnosis of  By: Graciela Husbands, MD, Susie Cassette    Rheumatic heart disease    With mitral valve replacement, tricuspid regurgitation moderate to severe, significant left atrial enlargment   TRICUSPID REGURGITATION 12/23/2010   Qualifier: Diagnosis of  By: Graciela Husbands, MD, Susie Cassette    Unspecified essential hypertension 07/31/2010   Qualifier: Diagnosis of  By: Graciela Husbands, MD, Susie Cassette     Patient Active Problem List   Diagnosis Date Noted   Malnutrition of moderate degree 08/28/2021   Influenza A 08/26/2021   Acute on chronic HFrEF (heart failure with reduced ejection fraction) (HCC) 08/26/2021   Hypoxemia 08/26/2021   Aortic stenosis 08/26/2021   Anticoagulant long-term use 08/26/2021   CKD (chronic kidney disease) stage 3, GFR 30-59 ml/min (HCC) 08/26/2021   Anxiety 05/13/2021   Aortic aneurysm (HCC) 05/13/2021   Atrial arrhythmia 05/13/2021   Bradycardia 05/13/2021   GERD (gastroesophageal reflux disease) 05/13/2021   Hypothyroidism 05/13/2021   Long Q-T syndrome 05/13/2021   Rheumatic heart disease 05/13/2021   Myocardial infarct (HCC) 11/20/2011   TRICUSPID REGURGITATION 12/23/2010   Essential hypertension 07/31/2010   CONGESTIVE HEART FAILURE 07/31/2010   SHORTNESS OF BREATH 07/31/2010   PACEMAKER, 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 Raford Pitcher  Cheek at [facility], LAD stent   [place]  11/20/2007   Dual chamber pacemaker implantation with rapid ventricular pacing   SALPINGECTOMY Bilateral    TONSILLECTOMY       OB History   No obstetric history on file.     Family History  Problem Relation Age of Onset   Leukemia Mother    Stroke Mother    Heart disease Father    Stroke Maternal Grandmother    Heart attack Paternal Grandmother      Social History   Tobacco Use   Smoking status: Never   Smokeless tobacco: Never  Substance Use Topics   Alcohol use: Never   Drug use: Never    Home Medications Prior to Admission medications   Medication Sig Start Date End Date Taking? Authorizing Provider  albuterol (VENTOLIN HFA) 108 (90 Base) MCG/ACT inhaler Inhale 2 puffs into the lungs every 6 (six) hours as needed for shortness of breath. 03/21/14  Yes [provider]  ALPRAZolam Duanne Moron) 1 MG tablet Take 0.5-1 mg by mouth 2 (two) times daily as needed for anxiety.   Yes [provider]  atorvastatin (LIPITOR) 80 MG tablet Take 80 mg by mouth at bedtime. 11/27/11  Yes [provider]  baclofen (LIORESAL) 20 MG tablet Take 10 mg by mouth every 8 (eight) hours as needed for muscle spasms. 01/03/21  Yes [provider]  candesartan (ATACAND) 8 MG tablet Take 4 mg by mouth daily. 06/02/21   [provider]  DILT-XR 240 MG 24 hr capsule Take 1 capsule (240 mg total) by mouth daily. 08/30/21   Mercy Riding, MD  furosemide (LASIX) 20 MG tablet Take 20 mg by mouth 2 (two) times daily. 09/21/21   [provider]  furosemide (LASIX) 40 MG tablet Take 40 mg by mouth daily.    [provider]  hydrochlorothiazide (HYDRODIURIL) 25 MG tablet Take 25 mg by mouth daily. 09/20/21   [provider]  levothyroxine (SYNTHROID) 75 MCG tablet Take 75 mcg by mouth daily. 03/12/21 08/29/21  [provider]  Multiple Vitamin (MULTIVITAMIN WITH MINERALS) TABS tablet Take 1 tablet by mouth daily. 08/30/21   Mercy Riding, MD  nitroGLYCERIN (NITROSTAT) 0.4 MG SL tablet Place 0.4 mg under the tongue every 5 (five) minutes as needed for chest pain. 01/14/21   [provider]  potassium chloride SA (K-DUR,KLOR-CON) 20 MEQ tablet Take 40 mEq by mouth daily.    [provider]  warfarin (COUMADIN) 5 MG tablet Take 2 tablets (10 mg total) by mouth daily for 1 day, THEN 1.5  tablets (7.5 mg total) daily. 08/30/21 11/29/21  Mercy Riding, MD  zolpidem (AMBIEN) 10 MG tablet Take 10 mg by mouth at bedtime. 11/27/11   [provider]    Allergies    Atenolol, Metoprolol succinate [metoprolol], Baclofen, and Latex  Review of Systems   Review of Systems  Constitutional:  Positive for fatigue. Negative for activity change, appetite change, chills, diaphoresis and fever.  HENT: Negative.    Eyes: Negative.   Respiratory:  Positive for cough and shortness of breath.   Cardiovascular: Negative.   Gastrointestinal:  Positive for abdominal distention. Negative for diarrhea, nausea, rectal pain and vomiting.  Genitourinary:  Positive for decreased urine volume. Negative for difficulty urinating, dysuria, enuresis, flank pain, pelvic pain, urgency, vaginal bleeding, vaginal discharge and vaginal pain.  Musculoskeletal: Negative.   Skin: Negative.   Neurological: Negative.   Hematological:  Negative.   Psychiatric/Behavioral: Negative.     Physical Exam Updated Vital Signs BP (!) 126/49    Pulse 63    Temp 97.7 F (36.5 C) (Oral)    Resp (!) 27    SpO2 94%   Physical Exam Vitals and nursing note reviewed.  Constitutional:      Appearance: She is not ill-appearing or toxic-appearing.  HENT:     Head: Normocephalic and atraumatic.     Nose: Nose normal.     Mouth/Throat:     Mouth: Mucous membranes are moist.     Pharynx: Oropharynx is clear. Uvula midline. No oropharyngeal exudate or posterior oropharyngeal erythema.     Tonsils: No tonsillar exudate.  Eyes:     General: Lids are normal. Vision grossly intact.        Right eye: No discharge.        Left eye: No discharge.     Extraocular Movements: Extraocular movements intact.     Conjunctiva/sclera: Conjunctivae normal.     Pupils: Pupils are equal, round, and reactive to light.  Neck:     Trachea: Trachea and phonation normal.     Meningeal: Brudzinski's sign absent.  Cardiovascular:     Rate and  Rhythm: Normal rate and regular rhythm.     Pulses: Normal pulses.     Heart sounds: Murmur heard.  Systolic murmur is present with a grade of 3/6.  Pulmonary:     Effort: Tachypnea and accessory muscle usage present. No bradypnea, prolonged expiration or respiratory distress.     Breath sounds: Examination of the right-lower field reveals rales. Examination of the left-lower field reveals rales. Rales present. No wheezing.     Comments: Patient hypoxic to 84% on room air, tachypneic, sats improved with 2 L of supplemental oxygen and nasal cannula with patient resting in bed. Chest:     Chest wall: No mass, lacerations, deformity, swelling, tenderness, crepitus or edema.  Abdominal:     General: Bowel sounds are normal. There is no distension.     Palpations: Abdomen is soft.     Tenderness: There is no abdominal tenderness. There is no right CVA tenderness, left CVA tenderness, guarding or rebound.  Musculoskeletal:        General: No deformity.     Cervical back: Normal range of motion and neck supple. No edema, rigidity or crepitus. No pain with movement, spinous process tenderness or muscular tenderness.     Right lower leg: No tenderness. 2+ Pitting Edema present.     Left lower leg: No tenderness. 2+ Pitting Edema present.  Lymphadenopathy:     Cervical: No cervical adenopathy.  Skin:    General: Skin is warm and dry.     Capillary Refill: Capillary refill takes less than 2 seconds.  Neurological:     General: No focal deficit present.     Mental Status: She is alert and oriented to person, place, and time. Mental status is at baseline.     Gait: Gait is intact.  Psychiatric:        Mood and Affect: Mood normal.    ED Results / Procedures / Treatments   Labs (all labs ordered are listed, but only abnormal results are displayed) Labs Reviewed  BASIC METABOLIC PANEL - Abnormal; Notable for the following components:      Result Value   Sodium 133 (*)    Glucose, Bld 117 (*)     Creatinine, Ser 1.27 (*)    GFR, Estimated  44 (*)    All other components within normal limits  CBC WITH DIFFERENTIAL/PLATELET - Abnormal; Notable for the following components:   RDW 16.3 (*)    Lymphs Abs 0.6 (*)    All other components within normal limits  BRAIN NATRIURETIC PEPTIDE - Abnormal; Notable for the following components:   B Natriuretic Peptide 2,815.3 (*)    All other components within normal limits  URINALYSIS, ROUTINE W REFLEX MICROSCOPIC - Abnormal; Notable for the following components:   Leukocytes,Ua SMALL (*)    All other components within normal limits  URINALYSIS, MICROSCOPIC (REFLEX) - Abnormal; Notable for the following components:   Bacteria, UA RARE (*)    Non Squamous Epithelial PRESENT (*)    All other components within normal limits  TROPONIN I (HIGH SENSITIVITY) - Abnormal; Notable for the following components:   Troponin I (High Sensitivity) 28 (*)    All other components within normal limits  RESP PANEL BY RT-PCR (FLU A&B, COVID) [id number]  HEPATIC FUNCTION PANEL  TROPONIN I (HIGH SENSITIVITY)    EKG None  Radiology DG Chest 2 View  Result Date: 09/30/2021 CLINICAL DATA:  Shortness of breath EXAM: CHEST - 2 VIEW COMPARISON:  08/26/2021 FINDINGS: Cardiomegaly status post median sternotomy with mitral annulus prosthesis. Left chest multi lead pacer. Small, layering bilateral pleural effusions and associated atelectasis or consolidation. Exaggerated thoracic kyphosis. Disc degenerative disease of the thoracic spine. IMPRESSION: 1.  Cardiomegaly. 2. Small, layering bilateral pleural effusions and associated atelectasis or consolidation. Electronically Signed   By: Delanna Ahmadi M.D.   On: 09/30/2021 11:27   DG Abdomen 1 View  Result Date: 09/30/2021 CLINICAL DATA:  Shortness of breath, LEFT lower quadrant abdominal pain, unable to urinate since yesterday, constipation EXAM: ABDOMEN - 1 VIEW COMPARISON:  02/27/2014 FINDINGS: Nonobstructive bowel gas  pattern. No bowel dilatation or bowel wall thickening. Bones demineralized with degenerative facet disease changes lower lumbar spine. Surgical clips RIGHT upper quadrant. No urinary tract calcifications. IMPRESSION: No acute abnormalities. Electronically Signed   By: Lavonia Dana M.D.   On: 09/30/2021 11:29    Procedures .Critical Care Performed by: Emeline Darling, PA-C Authorized by: Emeline Darling, PA-C   Critical care provider statement:    Critical care time (minutes):  45   Critical care was time spent personally by me on the following activities:  Development of treatment plan with patient or surrogate, discussions with consultants, evaluation of patient's response to treatment, examination of patient, obtaining history from patient or surrogate, ordering and performing treatments and interventions, ordering and review of laboratory studies, ordering and review of radiographic studies, pulse oximetry and re-evaluation of patient's condition   Medications Ordered in ED Medications  furosemide (LASIX) injection 40 mg (40 mg Intravenous Given 09/30/21 1257)    ED Course  I have reviewed the triage vital signs and the nursing notes.  Pertinent labs & imaging results that were available during my care of the patient were reviewed by me and considered in my medical decision making (see chart for details).  Clinical Course as of 09/30/21 1414  Tue Sep 30, 2021  1302 Hypoxic to 84 on RA, requiring 2L by [place] to get sats >90%.  [RS]  [id number] Consult to hospitalist, Dr. Lorin Mercy who is agreeable to seeing this patient and admitting her to her service.  I appreciate her collaboration in the care of this patient. [RS]    Clinical Course User Index [RS] Maxie Debose, Gypsy Balsam, PA-C   MDM Rules/Calculators/A&P  75 year old female who presents with concern for shortness of breath and decreased urine output.   Differential diagnosis includes is not limited to heart  failure exacerbation, dysrhythmia, pneumonia, pleural effusion, PE, atelectasis, pericardial effusion.  Hypertensive on intake, tachypneic, hypoxic to 84% on room air requiring 2 L submental oxygen by nasal cannula to improve sats.  Cardiopulmonary exam revealed notable systolic murmur, rales in the lung bases, tachypnea.  Abdominal exam is benign.  Patient with bilateral lower extremity pitting edema the DP pulses are palpable.  CBC unremarkable, BMP with mild hyponatremia of 133 and creatinine 1.2 near patient's baseline.  Troponin mildly elevated to 28 though was 52-month ago, likely demand ischemia today.  Patient is anticoagulated on warfarin.  Additionally BNP is significantly elevated 2815.  Lasix administered, chest x-ray revealed layering bilateral pleural effusions and EKG revealed sinus rhythm with paced rhythm and prolonged QT.  Do feel patient would benefit from admission to the hospital given new hypoxia in context of heart failure exacerbation.  Patient was admitted to the hospitalist service prior but her PCP is outside of the [facility] system.  It has been more than 30 days we will page for unassigned hospital admission.  Consult to hospitalist as above.  No further work-up warranted in the ER at this time.  Siobhan voiced understanding of her medical evaluation and treatment plan.  Each of her questions was answered to her expressed satisfaction.  She is amenable to plan for admission at this time.   This chart was dictated using voice recognition software, Dragon. Despite the best efforts of this provider to proofread and correct errors, errors may still occur which can change documentation meaning.   Final Clinical Impression(s) / ED Diagnoses Final diagnoses:  Acute on chronic congestive heart failure, unspecified heart failure type [facility])    Rx / DC Orders ED Discharge Orders     None         Emeline Darling, PA-C 09/30/21 1416    Wynona Dove A, DO 09/30/21  1550

## 2021-09-30 NOTE — ED Triage Notes (Addendum)
Pt reports she was scheduled for outpatient CT scan but isn't able to urinate since yesterday and also reports constipation.  Last BM 3 days ago.  Taking laxatives.  C/o SOB and generalized weakness x 1 week. Sats 90-92% on room air.  Placed on 2 liters [place].

## 2021-10-01 ENCOUNTER — Other Ambulatory Visit: Payer: Self-pay | Admitting: Cardiology

## 2021-10-01 DIAGNOSIS — I480 Paroxysmal atrial fibrillation: Secondary | ICD-10-CM

## 2021-10-01 LAB — BASIC METABOLIC PANEL
Anion gap: 9 (ref 5–15)
BUN: 15 mg/dL (ref 8–23)
CO2: 26 mmol/L (ref 22–32)
Calcium: 8.5 mg/dL — ABNORMAL LOW (ref 8.9–10.3)
Chloride: 97 mmol/L — ABNORMAL LOW (ref 98–111)
Creatinine, Ser: 1.14 mg/dL — ABNORMAL HIGH (ref 0.44–1.00)
GFR, Estimated: 51 mL/min — ABNORMAL LOW (ref >60)
Glucose, Bld: 90 mg/dL (ref 70–99)
Potassium: 2.8 mmol/L — ABNORMAL LOW (ref 3.5–5.1)
Sodium: 132 mmol/L — ABNORMAL LOW (ref 135–145)

## 2021-10-01 LAB — CBC
HCT: 33.5 % — ABNORMAL LOW (ref 36.0–46.0)
Hemoglobin: 10.7 g/dL — ABNORMAL LOW (ref 12.0–15.0)
MCH: 28.5 pg (ref 26.0–34.0)
MCHC: 31.9 g/dL (ref 30.0–36.0)
MCV: 89.1 fL (ref 80.0–100.0)
Platelets: 147 10*3/uL — ABNORMAL LOW (ref 150–400)
RBC: 3.76 MIL/uL — ABNORMAL LOW (ref 3.87–5.11)
RDW: 16.2 % — ABNORMAL HIGH (ref 11.5–15.5)
WBC: 4.7 10*3/uL (ref 4.0–10.5)
nRBC: 0 % (ref 0.0–0.2)

## 2021-10-01 LAB — PROTIME-INR
INR: 2.7 — ABNORMAL HIGH (ref 0.8–1.2)
Prothrombin Time: 28.8 seconds — ABNORMAL HIGH (ref 11.4–15.2)

## 2021-10-01 MED ORDER — DILTIAZEM HCL ER COATED BEADS 240 MG PO CP24
240.0000 mg | ORAL_CAPSULE | Freq: Every day | ORAL | Status: DC
  Administered 2021-10-01: 10:00:00 240 mg via ORAL
  Filled 2021-10-01 (×2): qty 1

## 2021-10-01 MED ORDER — ORAL CARE MOUTH RINSE
15.0000 mL | Freq: Two times a day (BID) | OROMUCOSAL | Status: DC
  Administered 2021-10-02 – 2021-10-06 (×9): 15 mL via OROMUCOSAL

## 2021-10-01 MED ORDER — POTASSIUM CHLORIDE CRYS ER 20 MEQ PO TBCR
40.0000 meq | EXTENDED_RELEASE_TABLET | Freq: Once | ORAL | Status: AC
  Administered 2021-10-01: 06:00:00 40 meq via ORAL
  Filled 2021-10-01: qty 2

## 2021-10-01 MED ORDER — ALPRAZOLAM 0.25 MG PO TABS
0.5000 mg | ORAL_TABLET | Freq: Three times a day (TID) | ORAL | Status: DC | PRN
Start: 2021-10-01 — End: 2021-10-06
  Administered 2021-10-01 – 2021-10-03 (×4): 0.5 mg via ORAL
  Administered 2021-10-04 – 2021-10-05 (×3): 1 mg via ORAL
  Administered 2021-10-05 – 2021-10-06 (×2): 0.5 mg via ORAL
  Filled 2021-10-01: qty 4
  Filled 2021-10-01: qty 2
  Filled 2021-10-01: qty 4
  Filled 2021-10-01 (×2): qty 2
  Filled 2021-10-01: qty 4
  Filled 2021-10-01 (×3): qty 2

## 2021-10-01 MED ORDER — POTASSIUM CHLORIDE 10 MEQ/100ML IV SOLN
10.0000 meq | INTRAVENOUS | Status: AC
  Administered 2021-10-01 (×3): 10 meq via INTRAVENOUS
  Filled 2021-10-01 (×3): qty 100

## 2021-10-01 NOTE — Evaluation (Signed)
Occupational Therapy Evaluation Patient Details Name: [name] [name] MRN: [id number] DOB: April 27, 1946 Today's Date: 10/01/2021   History of Present Illness 75 y.o. female with medical history significant of chronic systolic CHF; severe AS undergoing evaluation for TAVR; rheumatic valve disease s/p MVR on Coumadin; CAD s/p stent; afib; pacemaker placement; and stage 3 CKD presenting with SOB and urinary retention   Clinical Impression   Patient admitted for the diagnosis above, preparing for ? TVAR.  Currently she is very close to baseline, excluding O2 need.  She is not needing any assist beyond cueing for pursed lip breathing and therapeutic rest breaks.  She did desaturate to 85% on RA, but did rebound when O2 was replaced to 94% after a minute.  She does not use O2 at home.  OT to follow in the acute setting s/p TAVR if needed.  Post acute OT is not anticipated.  Her daughter will be staying with her for a few days post acute.        Recommendations for follow up therapy are one component of a multi-disciplinary discharge planning process, led by the attending physician.  Recommendations may be updated based on patient status, additional functional criteria and insurance authorization.   Follow Up Recommendations  No OT follow up    Assistance Recommended at Discharge PRN  Functional Status Assessment  Patient has not had a recent decline in their functional status  Equipment Recommendations  None recommended by OT    Recommendations for Other Services       Precautions / Restrictions Precautions Precautions: Other (comment) Precaution Comments: Watch O2 Restrictions Weight Bearing Restrictions: No      Mobility Bed Mobility Overal bed mobility: Modified Independent                  Transfers Overall transfer level: Modified independent                        Balance Overall balance assessment: No apparent balance deficits (not formally assessed)                                          ADL either performed or assessed with clinical judgement   ADL Overall ADL's : Modified independent                                             Vision Patient Visual Report: No change from baseline       Perception Perception Perception: Not tested   Praxis Praxis Praxis: Not tested    Pertinent Vitals/Pain Pain Assessment: No/denies pain     Hand Dominance Right   Extremity/Trunk Assessment Upper Extremity Assessment Upper Extremity Assessment: Overall WFL for tasks assessed   Lower Extremity Assessment Lower Extremity Assessment: Defer to PT evaluation   Cervical / Trunk Assessment Cervical / Trunk Assessment: Kyphotic;Other exceptions Cervical / Trunk Exceptions: T spine fx in remote past   Communication Communication Communication: No difficulties   Cognition Arousal/Alertness: Awake/alert Behavior During Therapy: WFL for tasks assessed/performed Overall Cognitive Status: Within Functional Limits for tasks assessed  Home Living Family/patient expects to be discharged to:: Private residence Living Arrangements: Alone Available Help at Discharge: Family;Available PRN/intermittently Type of Home: Mobile home Home Access: Stairs to enter Entrance Stairs-Number of Steps: 5 Entrance Stairs-Rails: Right;Left;Can reach both Home Layout: One level     Bathroom Shower/Tub: Teacher, early years/pre: Standard     Home Equipment: None          Prior Functioning/Environment Prior Level of Function : Independent/Modified Independent             Mobility Comments: Independent ADLs Comments: indpendent        OT Problem List: Decreased activity tolerance      OT Treatment/Interventions: Self-care/ADL training;Therapeutic activities;Balance training    OT Goals(Current goals can be found  in the care plan section) Acute Rehab OT Goals Patient Stated Goal: I hope they can take care of me so I can go home OT Goal Formulation: With patient Time For Goal Achievement: 10/15/21 Potential to Achieve Goals: Good  OT Frequency: Min 2X/week   Barriers to D/C:    none noted       Co-evaluation              AM-PAC OT "6 Clicks" Daily Activity     Outcome Measure Help from another person eating meals?: None Help from another person taking care of personal grooming?: None Help from another person toileting, which includes using toliet, bedpan, or urinal?: None Help from another person bathing (including washing, rinsing, drying)?: None Help from another person to put on and taking off regular upper body clothing?: None Help from another person to put on and taking off regular lower body clothing?: None 6 Click Score: 24   End of Session Equipment Utilized During Treatment: Oxygen Nurse Communication: Mobility status  Activity Tolerance: Patient tolerated treatment well Patient left: in bed;with call bell/phone within reach  OT Visit Diagnosis: Other (comment);Muscle weakness (generalized) (M62.81)                Time: [id number] OT Time Calculation (min): 19 min Charges:  OT General Charges $OT Visit: 1 Visit OT Evaluation $OT Eval Moderate Complexity: 1 Mod  10/01/2021  RP, OTR/L  Acute Rehabilitation Services  Office:  [phone]   Metta Clines 10/01/2021, 11:46 AM

## 2021-10-01 NOTE — Progress Notes (Addendum)
Progress Note  Patient Name: [name] [name] Date of Encounter: 10/01/2021  [facility] Cardiologist: Sherren Mocha, MD   Subjective   Breathing same. Not much urine output.   Inpatient Medications    Scheduled Meds:  atorvastatin  80 mg Oral QHS   diltiazem  240 mg Oral Daily   docusate sodium  100 mg Oral BID   furosemide  40 mg Intravenous BID   levothyroxine  75 mcg Oral QAC breakfast   sodium chloride flush  3 mL Intravenous Q12H   zolpidem  5 mg Oral QHS   Continuous Infusions:  potassium chloride 10 mEq (10/01/21 0715)   PRN Meds: acetaminophen **OR** acetaminophen, ALPRAZolam, baclofen, bisacodyl, hydrALAZINE, morphine injection, oxyCODONE, polyethylene glycol, traZODone   Vital Signs    Vitals:   10/01/21 0200 10/01/21 0400 10/01/21 0600 10/01/21 0752  BP: 128/90 (!) 114/51 (!) 114/39 (!) 116/101  Pulse: 66 65 (!) 54 65  Resp: (!) 21 20 (!) 23 20  Temp:      TempSrc:      SpO2: 95% 92% 92% 94%    Intake/Output Summary (Last 24 hours) at 10/01/2021 0800 Last data filed at 10/01/2021 0710 Gross per 24 hour  Intake 100 ml  Output 1000 ml  Net -900 ml   Last 3 Weights 08/30/2021 08/28/2021 08/26/2021  Weight (lbs) 114 lb 10.2 oz 117 lb 15.1 oz 120 lb  Weight (kg) 52 kg 53.5 kg 54.432 kg      Telemetry    Not on tele currently   ECG    N/A  Physical Exam   GEN: No acute distress.   Neck: + JVD Cardiac: RRR, 3/6 murmurs, rubs, or gallops.  Respiratory: faint basilar rales  GI: Soft, nontender, non-distended  MS:1+  edema; No deformity. Neuro:  Nonfocal  Psych: Normal affect   Labs    High Sensitivity Troponin:   Recent Labs  Lab 09/30/21 1103 09/30/21 1302  TROPONINIHS 28* 23*     Chemistry Recent Labs  Lab 09/30/21 1103 09/30/21 1302 10/01/21 0400  NA 133*  --  132*  K 4.6  --  2.8*  CL 99  --  97*  CO2 24  --  26  GLUCOSE 117*  --  90  BUN 18  --  15  CREATININE 1.27*  --  1.14*  CALCIUM 9.2  --  8.5*  PROT   --  7.0  --   ALBUMIN  --  4.1  --   AST  --  26  --   ALT  --  17  --   ALKPHOS  --  84  --   BILITOT  --  1.3*  --   GFRNONAA 44*  --  51*  ANIONGAP 10  --  9    Hematology Recent Labs  Lab 09/30/21 1103 10/01/21 0400  WBC 6.3 4.7  RBC 4.28 3.76*  HGB 12.1 10.7*  HCT 38.3 33.5*  MCV 89.5 89.1  MCH 28.3 28.5  MCHC 31.6 31.9  RDW 16.3* 16.2*  PLT 189 147*   Thyroid No results for input(s): TSH, FREET4 in the last 168 hours.  BNP Recent Labs  Lab 09/30/21 1103  BNP 2,815.3*    Radiology    DG Chest 2 View  Result Date: 09/30/2021 CLINICAL DATA:  Shortness of breath EXAM: CHEST - 2 VIEW COMPARISON:  08/26/2021 FINDINGS: Cardiomegaly status post median sternotomy with mitral annulus prosthesis. Left chest multi lead pacer. Small, layering bilateral pleural effusions and  associated atelectasis or consolidation. Exaggerated thoracic kyphosis. Disc degenerative disease of the thoracic spine. IMPRESSION: 1.  Cardiomegaly. 2. Small, layering bilateral pleural effusions and associated atelectasis or consolidation. Electronically Signed   By: Jearld Lesch M.D.   On: 09/30/2021 11:27   DG Abdomen 1 View  Result Date: 09/30/2021 CLINICAL DATA:  Shortness of breath, LEFT lower quadrant abdominal pain, unable to urinate since yesterday, constipation EXAM: ABDOMEN - 1 VIEW COMPARISON:  02/27/2014 FINDINGS: Nonobstructive bowel gas pattern. No bowel dilatation or bowel wall thickening. Bones demineralized with degenerative facet disease changes lower lumbar spine. Surgical clips RIGHT upper quadrant. No urinary tract calcifications. IMPRESSION: No acute abnormalities. Electronically Signed   By: Ulyses Southward M.D.   On: 09/30/2021 11:29    Cardiac Studies   None  Patient Profile     75 y.o. female with a hx of rheumatic heart disease s/p mech MV on warfarin, long QT w/ bradycardia s/p PPM, CHF, HTN, AS (undergoing TVAR eval), atrial flutter with a history of ablation and recurrent  atrial arrhythmias admitted for volume overload.   Assessment & Plan    Acute on chronic combined CHF -Echocardiogram 07/22/2021 at [facility] showed EF 30-35%, strain -8.3%, grade 3 diastolic dysfunction, severe left atrial dilatation, aortic valve mean gradient 53, peak velocity 449 cm/s, mean gradient across the mitral valve 3.5 mmHg. - BNP 2815 - Started on IV lasix 40mg  BID - No strict I & O or daily weight - Continue IV diuresis - Will review GDMT with MD and potentially involve TOC heart failure clinic (also admitted last month with CHF exacerbation) - Given Low EF, Cardizem is not a good choice of drug  2. 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 Aortic stenosis - Recommended TVAR Ct tomorrow and R&L cath on Friday   For questions or updates, please contact [facility] Please consult [email or web address] for contact info under        Signed, Saturday, PA  10/01/2021, 8:00 AM

## 2021-10-01 NOTE — ED Notes (Signed)
Pt resting on stretcher, no apparent distress noted. BSC emptied, pt has had no difficulty getting up without assistance. Stretcher changed for a different one in hallway that is lower due to pt height and ease of getting up to [facility]. Pt denies any complaints. No acute changes noted. Will continue to monitor. Lights dimmed, side rails up x1, call bell within reach, BSC at bedside and available.

## 2021-10-01 NOTE — Progress Notes (Signed)
PROGRESS NOTE  [name] [name] OXB:[id number] DOB: 06/05/1946 DOA: 09/30/2021 PCP: [name] Ponto, MD  Brief History   [name] [name] is a 75 y.o. 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  She was previously admitted from 11/8-12 with influenza A and CHF exacerbation.  She reports that she did get better after that hospitalization but then got worse again.  She is planned for TAVR and came in today for CT study but was too SOB with orthopnea and marked LE edema and so was sent to the ER for evaluation.  She is not normally on home O2 and current O2 sats are 92-93% on 4L [place] O2.  She does tell the same story multiple times about multiple issues and is mildly tangential.  ED Course: Heart failure exacerbation.  H/o rheumatic heart disease.  Going for TAVR eval today but reported more SOB and oliguria.  Creatinine at baseline.  Diuresing well here.  BNP elevated, troponin likely demand.  On 2L to keep sats >80%.   [facility] were consulted to admit the patient for further evaluation and care.  Cardiology was consulted.   Currently the patient is requiring 4L by [place] to keep saturations greater than 92%. She is being diuresed, and is currently negative 900 cc in terms of fluid balance.   Consultants  Cardiology  Procedures  None  Antibiotics   Anti-infectives (From admission, onward)    None      Subjective  The patient is resting comfortably. No new complaints.   Objective   Vitals:  Vitals:   10/01/21 1301 10/01/21 1645  BP: (!) 146/58 (!) 140/59  Pulse: 69 69  Resp: 16 (!) 21  Temp: 98.2 F (36.8 C) 97.7 F (36.5 C)  SpO2: 95% 94%    Exam:  Constitutional:  The patient is awake, alert, and oriented x 3. No acute Respiratory:  No increased work of  breathing. No wheezes, rales, or rhonchi No tactile fremitus Cardiovascular:  Regular rate and rhythm No murmurs, ectopy, or gallups. Positive for scattered rales. No lateral PMI. No thrills. Abdomen:  Abdomen is soft, non-tender, non-distended No hernias, masses, or organomegaly Normoactive bowel sounds.  Musculoskeletal:  No cyanosis or clubbing +1 pitting edema on lower extremities bilaterally Skin:  No rashes, lesions, ulcers palpation of skin: no induration or nodules Neurologic:  CN 2-12 intact Sensation all 4 extremities intact Psychiatric:  Mental status Mood, affect appropriate Orientation to person, place, time  judgment and insight appear intact I have personally reviewed the following:   Today's Data  Vitals  Lab Data  BMP CBC  Imaging  X-ray abdomen X-ray CXR  Cardiology Data  EKG  Scheduled Meds:  atorvastatin  80 mg Oral QHS   diltiazem  240 mg Oral Daily   docusate sodium  100 mg Oral BID   furosemide  40 mg Intravenous BID   levothyroxine  75 mcg Oral QAC breakfast   sodium chloride flush  3 mL Intravenous Q12H   zolpidem  5 mg Oral QHS   Continuous Infusions:  Principal Problem:   Acute on chronic combined systolic and diastolic CHF (congestive heart failure) (HCC) Active Problems:   Essential hypertension   PACEMAKER, PERMANENT   Atrial arrhythmia   Hypothyroidism   Aortic stenosis   CKD (chronic kidney disease) stage 3,  GFR 30-59 ml/min (HCC)   AKI (acute kidney injury) ([place])   DNR (do not resuscitate)   LOS: 1 day   A & P  Acute on chronic combined CHF -Patient with known h/o chronic systolic CHF presenting with worsening SOB and hypoxia, also with edema and orthopnea -CXR consistent with mild pulmonary edema -Significantly elevated BNP compared to prior -With elevated BNP and abnl CXR, acute decompensated CHF seems probable as diagnosis -Will admit, as per the Emergency HF Mortality Risk Grade.  The patient has: severe  pulmonary edema requiring new O2 therapy -07/2021 echo with EF 35-40% and grade 3 diastolic dysfunction; will not repeat -CHF order set utilized -Cardiology consulted -Was given Lasix 40 mg x 1 in ER and will continue with 40 mg IV BID -Continue [place] O2 for now -Mildly worsened kidney function at this time, will follow -Mildly elevated HS troponin is likely related to demand ischemia; doubt ACS based on symptoms   Valvular heart disease -She is s/p MVR, on Coumadin; pharmacy to dose -She is undergoing evaluation for TAVR   CAD -s/p stent -No current obvious c/o chest pain   Afib -Has pacemaker -Rate controlled with Dilt -Continue Coumadin - pharmacy to dose   HTN -Hold candesartan due to marginal BP and mild AKI -Continue Diltiazem -Will also add prn hydralazine   HLD -Continue Lipitor   AKI on Stage 3a CKD -Slightly worse than baseline, will trend and attempt to avoid nephrotoxic meds -Baseline creatinine appears to be 0.94.   Hypothyroidism -Continue Synthroid   Psych -Continue Xanax, Ambien -Patient appeared to have mild cognitive impairment at the time of my evaluation -Suggest outpatient evaluation   Note: This patient has been tested and is negative for the novel coronavirus COVID-19. She has NOT been vaccinated against COVID-19.    I have seen and examined this patient myself. I have spent 34 minutes in her evaluation and care.   Level of care: Telemetry Cardiac  DVT prophylaxis: Coumadin Code Status:  DNR - confirmed with patient Family Communication: None present; she declined to have me call family at the time of admission Disposition Plan:  The patient is from: home             Anticipated d/c is to: home, possibly with [facility] services             Anticipated d/c date will depend on clinical response to treatment, likely 2-4 days             Patient is currently: acutely ill   [name] Riendeau, DO [facility] Direct contact: see [email or web address]  7PM-7AM  contact night coverage as above 10/01/2021, 5:55 PM  LOS: 1 day

## 2021-10-01 NOTE — Progress Notes (Signed)
°  Transition of Care [facility]) Screening Note   Patient Details  Name: [name] [name] Date of Birth: 01-07-1946   Transition of Care [facility]) CM/SW Contact:    Leone Haven, RN Phone Number: 10/01/2021, 3:45 PM    Transition of Care Department [facility]) has reviewed patient and no TOC needs have been identified at this time. We will continue to monitor patient advancement through interdisciplinary progression rounds. If new patient transition needs arise, please place a TOC consult.

## 2021-10-01 NOTE — Progress Notes (Signed)
Subjective:  Less dyspnea able to lay flat   Objective:  Vitals:   10/01/21 0200 10/01/21 0400 10/01/21 0600 10/01/21 0752  BP: 128/90 (!) 114/51 (!) 114/39 (!) 116/101  Pulse: 66 65 (!) 54 65  Resp: (!) 21 20 (!) 23 20  Temp:      TempSrc:      SpO2: 95% 92% 92% 94%    Intake/Output from previous day:  Intake/Output Summary (Last 24 hours) at 10/01/2021 0852 Last data filed at 10/01/2021 [id number] Gross per 24 hour  Intake 200 ml  Output 1000 ml  Net -800 ml    Physical Exam: Overweight short white female PPM under left clavicle S1 click severe AS murmur muffled S2 PMI increased  Abdomen benign  Plus one edema  Lungs clear   Lab Results: Basic Metabolic Panel: Recent Labs    09/30/21 1103 10/01/21 0400  NA 133* 132*  K 4.6 2.8*  CL 99 97*  CO2 24 26  GLUCOSE 117* 90  BUN 18 15  CREATININE 1.27* 1.14*  CALCIUM 9.2 8.5*   Liver Function Tests: Recent Labs    09/30/21 1302  AST 26  ALT 17  ALKPHOS 84  BILITOT 1.3*  PROT 7.0  ALBUMIN 4.1   No results for input(s): LIPASE, AMYLASE in the last 72 hours. CBC: Recent Labs    09/30/21 1103 10/01/21 0400  WBC 6.3 4.7  NEUTROABS 5.0  --   HGB 12.1 10.7*  HCT 38.3 33.5*  MCV 89.5 89.1  PLT 189 147*     Imaging: DG Chest 2 View  Result Date: 09/30/2021 CLINICAL DATA:  Shortness of breath EXAM: CHEST - 2 VIEW COMPARISON:  08/26/2021 FINDINGS: Cardiomegaly status post median sternotomy with mitral annulus prosthesis. Left chest multi lead pacer. Small, layering bilateral pleural effusions and associated atelectasis or consolidation. Exaggerated thoracic kyphosis. Disc degenerative disease of the thoracic spine. IMPRESSION: 1.  Cardiomegaly. 2. Small, layering bilateral pleural effusions and associated atelectasis or consolidation. Electronically Signed   By: Delanna Ahmadi M.D.   On: 09/30/2021 11:27   DG Abdomen 1 View  Result Date: 09/30/2021 CLINICAL DATA:  Shortness of breath, LEFT lower  quadrant abdominal pain, unable to urinate since yesterday, constipation EXAM: ABDOMEN - 1 VIEW COMPARISON:  02/27/2014 FINDINGS: Nonobstructive bowel gas pattern. No bowel dilatation or bowel wall thickening. Bones demineralized with degenerative facet disease changes lower lumbar spine. Surgical clips RIGHT upper quadrant. No urinary tract calcifications. IMPRESSION: No acute abnormalities. Electronically Signed   By: Lavonia Dana M.D.   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 PAF with SSS and PPM Critical AS being evaluated for TAVR Clinically improved with modest diureesis Holding coumadin transition to heparin TAVR CTAls in am Previous CT at [facility] had annular area of 430 which is right in  between a 23/26 S[place]ien valve. Then she will have right and left cath Friday to clear coronary arteries Continue IV lasix   Charlton Haws 10/01/2021, 8:52 AM

## 2021-10-01 NOTE — Progress Notes (Signed)
ANTICOAGULATION CONSULT NOTE - Initial Consult  Pharmacy Consult for Heparin Indication:  Mechanical Valve  Allergies  Allergen Reactions   Atenolol Other (See Comments)    HR and pulse dropped   Metoprolol Succinate [Metoprolol] Other (See Comments)    HR and pulse dropped   Baclofen Anxiety and Other (See Comments)    Makes the patient feel anxious the day after taking this   Latex Rash    Patient Measurements: Vital Signs: BP: 114/39 (12/14 0600) Pulse Rate: 54 (12/14 0600)  Labs: Recent Labs    09/30/21 1103 09/30/21 1302 09/30/21 1557 10/01/21 0400  HGB 12.1  --   --  10.7*  HCT 38.3  --   --  33.5*  PLT 189  --   --  147*  LABPROT  --   --  32.2* 28.8*  INR  --   --  3.1* 2.7*  CREATININE 1.27*  --   --  1.14*  TROPONINIHS 28* 23*  --   --      CrCl cannot be calculated (Unknown ideal weight.).   Medical History: Past Medical History:  Diagnosis Date   Anxiety    Aortic aneurysm (HCC)    Atrial arrhythmia    With prior history of atrial flutter ablation   Bradycardia    s/p pacemaker implantation   CONGESTIVE HEART FAILURE 07/31/2010   Qualifier: Diagnosis of  By: Graciela Husbands, MD, Susie Cassette    GERD (gastroesophageal reflux disease)    Hypertension    Hypothyroidism    Long Q-T syndrome    Myocardial infarct (HCC) 11/20/2011   Stent placement for 100% blockage of LAD   PACEMAKER, 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   Qualifier: Diagnosis of  By: Graciela Husbands, MD, Susie Cassette    Rheumatic heart disease    With mitral valve replacement, tricuspid regurgitation moderate to severe, significant left atrial enlargment   TRICUSPID REGURGITATION 12/23/2010   Qualifier: Diagnosis of  By: Graciela Husbands, MD, Susie Cassette    Unspecified essential hypertension 07/31/2010   Qualifier: Diagnosis of  By: Graciela Husbands, MD, Susie Cassette     Medications:  (Not in a hospital admission)  Scheduled:   atorvastatin  80 mg Oral QHS   diltiazem  240 mg Oral Daily    docusate sodium  100 mg Oral BID   furosemide  40 mg Intravenous BID   levothyroxine  75 mcg Oral QAC breakfast   sodium chloride flush  3 mL Intravenous Q12H   zolpidem  5 mg Oral QHS   Infusions:   potassium chloride 10 mEq (10/01/21 0619)   PRN: acetaminophen **OR** acetaminophen, ALPRAZolam, baclofen, bisacodyl, hydrALAZINE, morphine injection, oxyCODONE, polyethylene glycol, traZODone  Assessment: Patient is a 17 yof with a history of chronic systolic CHF; severe AS undergoing evaluation for TAVR; 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 Patient presenting with urinary retention, SOB, and weakness. Heparin per pharmacy consult placed for mechanical valve.  Patient taking warfarin PTA. Home dose is 7.5 mg daily. Last dose 12/11. Per patient INR was > 5 at that time.  INR remains above goal at 2.7 this AM  Goal of Therapy:  Heparin level 0.3-0.7 units/ml (INR 2.5-3.5 for warfarin) Monitor platelets by anticoagulation protocol: Yes  Plan:  Start heparin when INR < 2.2 per Cardiology Re-check INR in AM tomorrow Continue to monitor H&H and platelets  Loleta Dicker, PharmD, [facility] Emergency Medicine Clinical Pharmacist ED RPh Phone: [phone] Main RX:  [phone] °

## 2021-10-01 NOTE — ED Notes (Signed)
Pt wanted half of the Xanax now and another half later when she wakes up in the middle of night and can't sleep. Pt normally takes 1mg  in 2 doses throughout the night. Pt given 0.5mg  at this time.

## 2021-10-01 NOTE — Progress Notes (Signed)
°  [facility]   MULTIDISCIPLINARY HEART VALVE TEAM   Patient admitted from CT/radiology after coming to the hospital for pre TAVR scans. She was found to be very SOB and volume overloaded at time of arrival and unable to lay flat for CT scans. She was ultimately admitted for IV diuresis.  Symptoms improved today however rounding team feels she may need one more day for diuresis prior to scans. Orders placed for CTs tomorrow AM. HRs appear to be in a good range. Will have RN place 18g AC. While admitted, she will have [facility] Friday if renal function stable after CT.    Georgie Chard NP-C Structural Heart Team  Pager: [phone]

## 2021-10-01 NOTE — Evaluation (Signed)
Physical Therapy Evaluation Patient Details Name: [name] [name] MRN: [id number] DOB: Dec 13, 1945 Today's Date: 10/01/2021  History of Present Illness  75 y.o. female presenting 09/30/21 with SOB and urinary retention. Admitted with volume overload and acute on chronic combined CHF. 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  Pt reports symptoms of fatigue and SOB with activity.  Pt has decreased upper and lower extremity strength, WFL ROM, and WFL balance.  Pt ambulated 299.41 ft during the 6 minute walk test requiring x3 rest breaks with max HR of 97, lowest O2 sat 91% on 3L O2, BP 120/86 during mobility.  5 meter walk test produced an average gait speed of 2.50 ft/sec which indicates she is not at high fall risk and falls in the category for limited community ambulator.  RPE was 8/10 and dyspnea was 8/10 during mobility.  Pt was limited by low back pain, lower extremity weakness, SOB, and fatigue.  Pts frailty rating was 4 which is considered Vulnerable and Frail.  Pt would benefit from continued PT in the acute care setting due to the above listed deficits in strength, endurance, and activity tolerance. PTA, she was independent without an AD, living alone in a 1-level house with 5 STE. She is currently ambulating at a supervision level without an AD.   General UE/LE Strength and ROM:  Strength (0-5/5) ROM (limited/full)  R UE  Grossly 3+ to 4-  Full  L UE  Grossly 3+ to 4-  Full  R LE  Grossly 4- to 4  Full  L LE  Grossly 4- to 4  Full    6 Minute Walk Test:   Total Distance Walked:299.41 ft.    Did the pt need a rest break? Yes If yes, why? Pain:Yes; Fatigue:Yes; Dyspnea/O2 saturations: Yes Comments: x3 rest breaks, 2 of which were in  standing and the other was in sitting   Pre-Test Post-Test  BP 140/79 140/59  HR 73 92  O2 saturations (indicated RA or L/min [place]) 97% on 3L 94% on 3L  Modified Borg Dyspnea Scale (0 none-10 maximal) 3/10 8/10  RPE (6 very light-10 very hard) 5/10 5/10  Comments: BP during 120/86, max HR during 97, lowest SpO2 during 91% on 3L, DOE during 8/10, RPE during 8/10  5 Meter Walk Test:  Trial 1 6.49 seconds  Trial 2 6.74 seconds  Trial 3 6.43 seconds  3 Trial Average/Gait Speed 6.55 seconds/  2.50 ft/sec (<1.8 ft/sec indicates high fall risk)    Clinical Frailty Scale (1 very fit - 9 terminally ill): 4 (>/= 3/9 is considered frail)         Recommendations for follow up therapy are one component of a multi-disciplinary discharge planning process, led by the attending physician.  Recommendations may be updated based on patient status, additional functional criteria and insurance authorization.  Follow Up Recommendations No PT follow up    Assistance Recommended at Discharge PRN  Functional Status Assessment Patient has had a recent decline in their functional status and demonstrates the ability to make significant improvements in function in a reasonable and predictable amount of time.  Equipment Recommendations  None recommended by PT    Recommendations for Other Services       Precautions / Restrictions Precautions  Precautions: Other (comment) Precaution Comments: Watch O2 Restrictions Weight Bearing Restrictions: No      Mobility  Bed Mobility Overal bed mobility: Modified Independent             General bed mobility comments: Pt able to perform all bed mobility aspects safely without assistance, HOB elevated.    Transfers Overall transfer level: Modified independent Equipment used: None               General transfer comment: Able to come to stand without assistance, extra time.    Ambulation/Gait Ambulation/Gait assistance: Supervision Gait Distance  (Feet): 180 Feet (x5 bouts of ~180 ft > ~90 ft > ~168 ft > ~168 ft > ~180 ft) Assistive device: None Gait Pattern/deviations: Step-through pattern;Decreased stride length;Trunk flexed Gait velocity: reduced Gait velocity interpretation: 1.31 - 2.62 ft/sec, indicative of limited community ambulator   General Gait Details: Pt with slow, but steady gait, no LOB, supervision for safety.  Stairs            Wheelchair Mobility    Modified Rankin (Stroke Patients Only)       Balance Overall balance assessment: No apparent balance deficits (not formally assessed)                                           Pertinent Vitals/Pain Pain Assessment: Faces Faces Pain Scale: Hurts little more Pain Location: back Pain Descriptors / Indicators: Discomfort;Guarding Pain Intervention(s): Limited activity within patient's tolerance;Monitored during session;Repositioned    Home Living Family/patient expects to be discharged to:: Private residence Living Arrangements: Alone Available Help at Discharge: Family;Available PRN/intermittently Type of Home: Mobile home Home Access: Stairs to enter Entrance Stairs-Rails: Right;Left;Can reach both Entrance Stairs-Number of Steps: 5   Home Layout: One level Home Equipment: None      Prior Function Prior Level of Function : Independent/Modified Independent             Mobility Comments: Independent without AD. Has not fallen in several years. ADLs Comments: indpendent     Hand Dominance   Dominant Hand: Right    Extremity/Trunk Assessment   Upper Extremity Assessment Upper Extremity Assessment: Defer to OT evaluation    Lower Extremity Assessment Lower Extremity Assessment: Generalized weakness (MMT scores of 4- to 4 grossly; denies numbness/tingling)    Cervical / Trunk Assessment Cervical / Trunk Assessment: Kyphotic;Other exceptions Cervical / Trunk Exceptions: T spine fx in remote past  Communication    Communication: No difficulties  Cognition Arousal/Alertness: Awake/alert Behavior During Therapy: WFL for tasks assessed/performed Overall Cognitive Status: Within Functional Limits for tasks assessed                                          General Comments General comments (skin integrity, edema, etc.): SpO2 as low as 86% on RA, 91-97% on 3L, HR 73-97, BP 140/79 at rest, 120/86 while ambulating, 140/59 at rest after session    Exercises     Assessment/Plan    PT Assessment Patient needs continued PT services  PT Problem List Decreased strength;Decreased activity tolerance;Decreased balance;Decreased mobility;Cardiopulmonary status limiting activity;Pain       PT Treatment Interventions DME instruction;Gait training;Stair training;Functional mobility training;Therapeutic activities;Therapeutic exercise;Balance training;Neuromuscular re-education;Patient/family education    PT Goals (Current goals can be found in  the Care Plan section)  Acute Rehab PT Goals Patient Stated Goal: to get stronger PT Goal Formulation: With patient Time For Goal Achievement: 10/15/21 Potential to Achieve Goals: Good    Frequency Min 3X/week   Barriers to discharge        Co-evaluation               AM-PAC PT "6 Clicks" Mobility  Outcome Measure Help needed turning from your back to your side while in a flat bed without using bedrails?: None Help needed moving from lying on your back to sitting on the side of a flat bed without using bedrails?: None Help needed moving to and from a bed to a chair (including a wheelchair)?: None Help needed standing up from a chair using your arms (e.g., wheelchair or bedside chair)?: None Help needed to walk in hospital room?: A Little Help needed climbing 3-5 steps with a railing? : A Little 6 Click Score: 22    End of Session Equipment Utilized During Treatment: Oxygen Activity Tolerance: Patient tolerated treatment well Patient  left: in bed;with call bell/phone within reach;with nursing/sitter in room   PT Visit Diagnosis: Other abnormalities of gait and mobility (R26.89);Muscle weakness (generalized) (M62.81);Difficulty in walking, not elsewhere classified (R26.2);Pain Pain - part of body:  (back)    Time: 1696-7893 PT Time Calculation (min) (ACUTE ONLY): 51 min   Charges:   PT Evaluation $PT Eval Moderate Complexity: 1 Mod PT Treatments $Gait Training: 23-37 mins        Raymond Gurney, PT, DPT Acute Rehabilitation Services  Pager: [phone] Office: [phone]   Jewel Baize 10/01/2021, 4:46 PM

## 2021-10-01 NOTE — Progress Notes (Signed)
Heart Failure Navigator Progress Note  Assessed for Heart & Vascular TOC clinic readiness.  Patient does not meet criteria due to currently being worked up for possible TAVR d/t severe AS.   Navigator available for reassessment of patient.   Ozella Rocks, MSN, RN Heart Failure Nurse Navigator [phone]

## 2021-10-01 NOTE — ED Notes (Signed)
[name] [name] daughter [phone] requesting to speak to the patient

## 2021-10-02 ENCOUNTER — Inpatient Hospital Stay (HOSPITAL_COMMUNITY): Payer: Medicare Other

## 2021-10-02 DIAGNOSIS — I350 Nonrheumatic aortic (valve) stenosis: Secondary | ICD-10-CM

## 2021-10-02 DIAGNOSIS — I2510 Atherosclerotic heart disease of native coronary artery without angina pectoris: Secondary | ICD-10-CM

## 2021-10-02 DIAGNOSIS — J449 Chronic obstructive pulmonary disease, unspecified: Secondary | ICD-10-CM

## 2021-10-02 DIAGNOSIS — I5043 Acute on chronic combined systolic (congestive) and diastolic (congestive) heart failure: Secondary | ICD-10-CM | POA: Diagnosis not present

## 2021-10-02 DIAGNOSIS — I480 Paroxysmal atrial fibrillation: Secondary | ICD-10-CM

## 2021-10-02 LAB — BASIC METABOLIC PANEL
Anion gap: 11 (ref 5–15)
BUN: 14 mg/dL (ref 8–23)
CO2: 24 mmol/L (ref 22–32)
Calcium: 8.5 mg/dL — ABNORMAL LOW (ref 8.9–10.3)
Chloride: 100 mmol/L (ref 98–111)
Creatinine, Ser: 1.14 mg/dL — ABNORMAL HIGH (ref 0.44–1.00)
GFR, Estimated: 51 mL/min — ABNORMAL LOW (ref >60)
Glucose, Bld: 111 mg/dL — ABNORMAL HIGH (ref 70–99)
Potassium: 3.5 mmol/L (ref 3.5–5.1)
Sodium: 135 mmol/L (ref 135–145)

## 2021-10-02 LAB — PROTIME-INR
INR: 2 — ABNORMAL HIGH (ref 0.8–1.2)
Prothrombin Time: 22.7 seconds — ABNORMAL HIGH (ref 11.4–15.2)

## 2021-10-02 LAB — HEPARIN LEVEL (UNFRACTIONATED): Heparin Unfractionated: 0.4 IU/mL (ref 0.30–0.70)

## 2021-10-02 MED ORDER — ALUM & MAG HYDROXIDE-SIMETH 200-200-20 MG/5ML PO SUSP
30.0000 mL | Freq: Four times a day (QID) | ORAL | Status: DC | PRN
  Administered 2021-10-02: 30 mL via ORAL
  Filled 2021-10-02: qty 30

## 2021-10-02 MED ORDER — SODIUM CHLORIDE 0.9% FLUSH
3.0000 mL | Freq: Two times a day (BID) | INTRAVENOUS | Status: DC
  Administered 2021-10-02 – 2021-10-03 (×3): 3 mL via INTRAVENOUS

## 2021-10-02 MED ORDER — SODIUM CHLORIDE 0.9 % IV SOLN: 250.0000 mL | INTRAVENOUS | Status: DC | PRN

## 2021-10-02 MED ORDER — ALUM & MAG HYDROXIDE-SIMETH 200-200-20 MG/5ML PO SUSP: 30.0000 mL | Freq: Once | ORAL | Status: DC

## 2021-10-02 MED ORDER — PANTOPRAZOLE SODIUM 40 MG PO TBEC
40.0000 mg | DELAYED_RELEASE_TABLET | Freq: Every day | ORAL | Status: DC
  Administered 2021-10-03 – 2021-10-06 (×4): 40 mg via ORAL
  Filled 2021-10-02 (×4): qty 1

## 2021-10-02 MED ORDER — ASPIRIN 81 MG PO CHEW
81.0000 mg | CHEWABLE_TABLET | ORAL | Status: AC
  Administered 2021-10-03: 81 mg via ORAL
  Filled 2021-10-02: qty 1

## 2021-10-02 MED ORDER — ENSURE ENLIVE PO LIQD
237.0000 mL | Freq: Three times a day (TID) | ORAL | Status: DC
  Administered 2021-10-02 – 2021-10-06 (×9): 237 mL via ORAL

## 2021-10-02 MED ORDER — SODIUM CHLORIDE 0.9% FLUSH: 3.0000 mL | INTRAVENOUS | Status: DC | PRN

## 2021-10-02 MED ORDER — POTASSIUM CHLORIDE CRYS ER 20 MEQ PO TBCR
40.0000 meq | EXTENDED_RELEASE_TABLET | Freq: Two times a day (BID) | ORAL | Status: AC
  Administered 2021-10-02 (×2): 40 meq via ORAL
  Filled 2021-10-02 (×2): qty 2

## 2021-10-02 MED ORDER — SODIUM CHLORIDE 0.9 % IV SOLN: INTRAVENOUS | Status: DC

## 2021-10-02 MED ORDER — HEPARIN (PORCINE) 25000 UT/250ML-% IV SOLN
850.0000 [IU]/h | INTRAVENOUS | Status: DC
  Administered 2021-10-02: 750 [IU]/h via INTRAVENOUS
  Filled 2021-10-02: qty 250

## 2021-10-02 MED ORDER — ADULT MULTIVITAMIN W/MINERALS CH
1.0000 | ORAL_TABLET | Freq: Every day | ORAL | Status: DC
  Administered 2021-10-04 – 2021-10-06 (×2): 1 via ORAL
  Filled 2021-10-02 (×4): qty 1

## 2021-10-02 MED ORDER — IOHEXOL 350 MG/ML SOLN
100.0000 mL | Freq: Once | INTRAVENOUS | Status: AC | PRN
  Administered 2021-10-02: 100 mL via INTRAVENOUS

## 2021-10-02 MED ORDER — PANTOPRAZOLE SODIUM 40 MG IV SOLR
40.0000 mg | Freq: Once | INTRAVENOUS | Status: AC
  Administered 2021-10-03: 40 mg via INTRAVENOUS
  Filled 2021-10-02: qty 40

## 2021-10-02 NOTE — Progress Notes (Signed)
PROGRESS NOTE  [name] [name] [id number] DOB: May 06, 1946 DOA: 09/30/2021 PCP: Marylen Ponto, MD  Brief History   [name] [name] is a 75 y.o. 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  She was previously admitted from 11/8-12 with influenza A and CHF exacerbation.  She reports that she did get better after that hospitalization but then got worse again.  She is planned for TAVR and came in today for CT study but was too SOB with orthopnea and marked LE edema and so was sent to the ER for evaluation.  She is not normally on home O2 and current O2 sats are 92-93% on 4L [place] O2.  She does tell the same story multiple times about multiple issues and is mildly tangential.  ED Course: Heart failure exacerbation.  H/o rheumatic heart disease.  Going for TAVR eval today but reported more SOB and oliguria.  Creatinine at baseline.  Diuresing well here.  BNP elevated, troponin likely demand.  On 2L to keep sats >80%.   [facility] were consulted to admit the patient for further evaluation and care.  Cardiology was consulted.   Currently the patient is requiring 2L by [place] to keep saturations greater than 93%. She is being diuresed, and is currently negative  2154 cc in terms of fluid balance.   Plan is for [facility] tomorrow.  Consultants  Cardiology  Procedures  None  Antibiotics   Anti-infectives (From admission, onward)    None      Subjective  The patient is resting comfortably. No new complaints.   Objective   Vitals:  Vitals:   10/02/21 0807 10/02/21 1700  BP: (!) 123/53   Pulse: 60   Resp: 18   Temp: 98.6 F (37 C)   SpO2: 92% 93%    Exam:  Constitutional:  The patient is awake, alert, and oriented x 3. No acute Respiratory:  No increased work of  breathing. No wheezes, rales, or rhonchi No tactile fremitus Cardiovascular:  Regular rate and rhythm No murmurs, ectopy, or gallups. Positive for scattered rales. No lateral PMI. No thrills. Abdomen:  Abdomen is soft, non-tender, non-distended No hernias, masses, or organomegaly Normoactive bowel sounds.  Musculoskeletal:  No cyanosis or clubbing +1 pitting edema on lower extremities bilaterally Skin:  No rashes, lesions, ulcers palpation of skin: no induration or nodules Neurologic:  CN 2-12 intact Sensation all 4 extremities intact Psychiatric:  Mental status Mood, affect appropriate Orientation to person, place, time  judgment and insight appear intact I have personally reviewed the following:   Today's Data  Vitals  Lab Data  BMP CBC  Imaging  X-ray abdomen X-ray CXR  Cardiology Data  EKG  Scheduled Meds:  atorvastatin  80 mg Oral QHS   docusate sodium  100 mg Oral BID   feeding supplement  237 mL Oral TID BM   furosemide  40 mg Intravenous BID   levothyroxine  75 mcg Oral QAC breakfast   mouth rinse  15 mL Mouth Rinse BID   multivitamin with minerals  1 tablet Oral Daily   potassium chloride  40 mEq Oral BID   sodium chloride flush  3 mL Intravenous Q12H   sodium chloride flush  3 mL Intravenous Q12H   zolpidem  5 mg Oral QHS   Continuous Infusions:  heparin 750  Units/hr (10/02/21 [id number])    Principal Problem:   Acute on chronic combined systolic and diastolic CHF (congestive heart failure) (HCC) Active Problems:   Essential hypertension   PACEMAKER, PERMANENT   Atrial arrhythmia   Hypothyroidism   Aortic stenosis   CKD (chronic kidney disease) stage 3, GFR 30-59 ml/min (HCC)   AKI (acute kidney injury) ([place])   DNR (do not resuscitate)   LOS: 2 days   A & P  Acute on chronic combined CHF -Patient with known h/o chronic systolic CHF presenting with worsening SOB and hypoxia, also with edema and orthopnea -CXR consistent with mild  pulmonary edema -Significantly elevated BNP compared to prior -With elevated BNP and abnl CXR, acute decompensated CHF seems probable as diagnosis -Will admit, as per the Emergency HF Mortality Risk Grade.  The patient has: severe pulmonary edema requiring new O2 therapy -07/2021 echo with EF 35-40% and grade 3 diastolic dysfunction; will not repeat -CHF order set utilized -Cardiology consulted -Was given Lasix 40 mg x 1 in ER and will continue with 40 mg IV BID -Continue [place] O2 for now. Requiring 2L by [place] to maintain SaO2 of 93%. Wean as possible.  -Creatinine improving. Monitor creatinine, electrolytes and volume status. -Mildly elevated HS troponin is likely related to demand ischemia; doubt ACS based on symptoms  -Plan is for [facility] tomorrow.  Valvular heart disease -She is s/p MVR, on Coumadin; pharmacy to dose -She is undergoing evaluation for TAVR -Plan is for [facility] tomorrow.   CAD -s/p stent -No current obvious c/o chest pain   Afib -Has pacemaker -Rate controlled with [place] -Continue Coumadin - pharmacy to dose   HTN -Hold candesartan due to marginal BP and mild AKI -Continue Diltiazem -Will also add prn hydralazine   HLD -Continue Lipitor   AKI on Stage 3a CKD -Slightly worse than baseline, will trend and attempt to avoid nephrotoxic meds -Baseline creatinine appears to be 0.94. Creatinine is 1.14 today.   Hypothyroidism -Continue Synthroid   Psych -Continue Xanax, Ambien -Patient appeared to have mild cognitive impairment at the time of my evaluation -Suggest outpatient evaluation   Note: This patient has been tested and is negative for the novel coronavirus COVID-19. She has NOT been vaccinated against COVID-19.    I have seen and examined this patient myself. I have spent 70minutes in her evaluation and care.   Level of care: Telemetry Cardiac  DVT prophylaxis: Coumadin Code Status:  DNR - confirmed with patient Family Communication: None present; she  declined to have me call family at the time of admission Disposition Plan:  The patient is from: home             Anticipated d/c is to: home, possibly with [facility] services             Anticipated d/c date will depend on clinical response to treatment, likely 2-4 days             Patient is currently: acutely ill   [name] Zeleznik, DO [facility] Direct contact: see [email or web address]  7PM-7AM contact night coverage as above 10/02/2021, 6:29 PM  LOS: 1 day

## 2021-10-02 NOTE — Consult Note (Addendum)
[facility]VE TEAM  Cardiology Consultation:   Patient ID: [name] [name] MRN: [id number]; DOB: 1946/01/13  Admit date: 09/30/2021 Date of Consult: 10/02/2021  Primary Care Provider: Ronita Hipps, MD [facility] Cardiologist: Sherren Mocha, MD  [place] Electrophysiologist:  None    Patient Profile:   [name] [name] is a 75 y.o. 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 Burt Knack.  History of Present Illness:   [name] [name] lives alone in [place]. She has two daughters, one who lives locally. She takes care of all of her own ADLs, but this has become limited due to her health decline. Of note, she wears full dentures.   She had rheumatic fever at age 37 and went on to develop rheumatic heart disease. She ultimately underwent mechanical mitral valve replacement approximately 20 years ago at [facility]. She has tolerated long-term warfarin therapy well without any recent bleeding problems. She underwent permanent pacemaker placement (dual-chamber) in 2009 by Dr Caryl Comes. The patient's recent echocardiogram from July 22, 2021 demonstrates severe global LV systolic dysfunction with LVEF 30 to 35%, severe calcification and restriction of the aortic valve leaflets, mild to moderate aortic valve insufficiency, and critical aortic stenosis with a mean transvalvular gradient of 53 mmHg, peak systolic velocity of 4.5 m/s, and calculated aortic valve area less than 0.5 cm.  She actually underwent TAVR evaluation at [facility], but did not follow through with completing her work-up there.  She had CT angiography studies done earlier this year.  She appeared to  have access for transfemoral TAVR. She was then evaluated at [facility] but did not want to be treated at an academic center and requested referral to [facility]. She was seen by Dr. Burt Knack in the office on 08/22/21 and complained of shortness of breath and fatigue with low-level activity. She described progressive shortness of breath and generalized fatigue over the past year, now with limitation during low-level activities.  She can only walk short distances without feeling breathless.    She was then admitted 11/8-11/12 for influenza A and CHF. She was treated with IV lasix and Tamiflu. She had agitation and was noncompliant during this admission refusing care and being aggressive with staff. Pt refused further lasix stating she was dehydrated and home HCTZ was discontinued at discharge.   We attempted to bring her back into the office for re-evaluation with structural heart, but patient no showed and then ultimately refused an office visit saying she wanted to save money and wanted to just proceed with CT scans and appt with Dr. Cyndia Bent. These were set up for 10/01/21 but, unfortunately, the patient was too volume overloaded to lay for scanning and she was directly admitted to the hospital for IV diuresis. Once able to lye flat CT scans were completed 10/02/21. Cardiac gated CTA of the heart revealed anatomical characteristics consistent with aortic stenosis suitable for treatment by transcatheter aortic valve replacement without any significant complicating features and CTA of the aorta and iliac vessels demonstrated what appear to be adequate pelvic vascular access to facilitate a transfemoral approach. Her coumadin was transitioned to heparin with plan to proceed with  L/RHC on 10/03/21. INR 2.0 today.   Today she is seen laying in bed. She has no complaints. Breathing better. Mild LE edema. Laying on her side bc of back pain. She says she feels a little woozy from a pain pill.     Past Medical History:  Diagnosis Date   Anxiety    Aortic aneurysm [facility])    Atrial arrhythmia    With prior history of atrial flutter ablation   Bradycardia    s/p pacemaker implantation   CONGESTIVE HEART FAILURE 07/31/2010   Qualifier: Diagnosis of  By: Caryl Comes, MD, Remus Blake    GERD (gastroesophageal reflux disease)    Hypertension    Hypothyroidism    Long Q-T syndrome    Myocardial infarct ([place]) 11/20/2011   Stent placement for 100% blockage of LAD   PACEMAKER, 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   Qualifier: Diagnosis of  By: Caryl Comes, MD, Remus Blake    Rheumatic heart disease    With mitral valve replacement, tricuspid regurgitation moderate to severe, significant left atrial enlargment   TRICUSPID REGURGITATION 12/23/2010   Qualifier: Diagnosis of  By: Caryl Comes, MD, Remus Blake    Unspecified essential hypertension 07/31/2010   Qualifier: Diagnosis of  By: Caryl Comes, MD, [facility], Mack Guise     Past Surgical History:  Procedure Laterality Date   APPENDECTOMY     CARDIAC ELECTROPHYSIOLOGY STUDY AND ABLATION     Stent and pacemaker placement   CHOLECYSTECTOMY     CORONARY ANGIOPLASTY WITH STENT PLACEMENT Left 09/13/2015   Dr. Gwenith Spitz at [facility], LAD stent   MITRAL VALVE REPLACEMENT  2001   mechanical valve   OOPHORECTOMY     PACEMAKER INSERTION  11/20/2007   Dual chamber pacemaker implantation with rapid ventricular pacing   SALPINGECTOMY Bilateral    TONSILLECTOMY       Home Medications:  Prior to Admission medications   Medication Sig Start Date End Date Taking? Authorizing Provider  albuterol (VENTOLIN HFA) 108 (90 Base) MCG/ACT inhaler Inhale 2 puffs into the lungs every 6 (six) hours as needed for shortness of breath. 03/21/14  Yes [provider]  ALPRAZolam Duanne Moron) 1 MG tablet Take 0.5-1 mg by mouth 2 (two) times daily as needed for anxiety.   Yes [provider]  atorvastatin (LIPITOR) 80 MG tablet Take 80 mg by mouth at  bedtime. 11/27/11  Yes [provider]  baclofen (LIORESAL) 20 MG tablet Take 10 mg by mouth every 8 (eight) hours as needed for muscle spasms. 01/03/21  Yes [provider]  candesartan (ATACAND) 8 MG tablet Take 4 mg by mouth daily as needed ("as long as the Diastolic number is not too low"). 06/02/21  Yes [provider]  DILT-XR 240 MG 24 hr capsule Take 1 capsule (240 mg total) by mouth daily. 08/30/21  Yes Mercy Riding, MD  furosemide (LASIX) 20 MG tablet Take 40 mg by mouth 2 (two) times daily. 09/21/21  Yes [provider]  KLOR-CON M20 20 MEQ tablet Take 40 mEq by mouth See admin instructions. Crush and mix 40 mEq with applesauce and eat once a day   Yes [provider]  lactose free nutrition (BOOST PLUS) LIQD Take 237 mLs by mouth 2 (two) times daily.   Yes [provider]  levothyroxine (SYNTHROID) 75 MCG tablet Take 75 mcg by mouth daily before breakfast. 03/12/21 10/31/21 Yes [provider]  nitroGLYCERIN (NITROSTAT) 0.4 MG SL tablet Place 0.4 mg under the tongue every 5 (five)  minutes as needed for chest pain. 01/14/21  Yes [provider]  warfarin (COUMADIN) 5 MG tablet Take 2 tablets (10 mg total) by mouth daily for 1 day, THEN 1.5 tablets (7.5 mg total) daily. Patient taking differently: Take 7.5 mg by mouth at bedtime unless INR is 2.5-3.5, then adjust as directed 08/30/21 11/29/21 Yes Gonfa, Charlesetta Ivory, MD  zolpidem (AMBIEN) 10 MG tablet Take 5-10 mg by mouth at bedtime. 11/27/11  Yes [provider]    Inpatient Medications: Scheduled Meds:  atorvastatin  80 mg Oral QHS   docusate sodium  100 mg Oral BID   furosemide  40 mg Intravenous BID   levothyroxine  75 mcg Oral QAC breakfast   mouth rinse  15 mL Mouth Rinse BID   potassium chloride  40 mEq Oral BID   sodium chloride flush  3 mL Intravenous Q12H   zolpidem  5 mg Oral QHS   Continuous Infusions:  heparin 750 Units/hr (10/02/21 0927)   PRN  Meds: acetaminophen **OR** acetaminophen, ALPRAZolam, baclofen, bisacodyl, hydrALAZINE, morphine injection, oxyCODONE, polyethylene glycol, traZODone  Allergies:    Allergies  Allergen Reactions   Atenolol Other (See Comments)    HR and pulse dropped   Metoprolol Succinate [Metoprolol] Other (See Comments)    HR and pulse dropped   Baclofen Anxiety and Other (See Comments)    Makes the patient feel anxious the day after taking this   Latex Rash    Social History:   Social History   Socioeconomic History   Marital status: Single    Spouse name: Not on file   Number of children: Not on file   Years of education: Not on file   Highest education level: Not on file  Occupational History   Not on file  Tobacco Use   Smoking status: Never   Smokeless tobacco: Never  Substance and Sexual Activity   Alcohol use: Never   Drug use: Never   Sexual activity: Not on file  Other Topics Concern   Not on file  Social History Narrative   Not on file   Social Determinants of Health   Financial Resource Strain: Not on file  Food Insecurity: Not on file  Transportation Needs: Not on file  Physical Activity: Not on file  Stress: Not on file  Social Connections: Not on file  Intimate Partner Violence: Not on file    Family History:    Family History  Problem Relation Age of Onset   Leukemia Mother    Stroke Mother    Heart disease Father    Stroke Maternal Grandmother    Heart attack Paternal Grandmother      ROS:  Please see the history of present illness.  All other ROS reviewed and negative.     Physical Exam/Data:   Vitals:   10/01/21 2031 10/02/21 0001 10/02/21 0557 10/02/21 0807  BP: (!) 115/53 (!) 103/53 (!) 117/50 (!) 123/53  Pulse: 61 (!) 59 63 60  Resp: 15 (!) 24 15 18   Temp: 98.6 F (37 C) 98.3 F (36.8 C) 97.6 F (36.4 C) 98.6 F (37 C)  TempSrc: Oral Oral Oral Oral  SpO2: 97% 92% 93% 92%  Weight:  54.4 kg      Intake/Output Summary (Last 24 hours)  at 10/02/2021 0952 Last data filed at 10/02/2021 0536 Gross per 24 hour  Intake 945 ml  Output 1450 ml  Net -505 ml   Last 3 Weights 10/02/2021 08/30/2021 08/28/2021  Weight (lbs) 120  lb 114 lb 10.2 oz 117 lb 15.1 oz  Weight (kg) 54.432 kg 52 kg 53.5 kg     Body mass index is 25.08 kg/m.  General:  Well nourished, well developed, in no acute distress HEENT: normal Lymph: no adenopathy Neck: no JVD Endocrine:  No thyromegaly Cardiac: normal S1, S2; RRR; harsh SEM, mechanical click at apex Lungs:  clear to auscultation bilaterally, no wheezing, rhonchi or rales  Abd: soft, nontender, no hepatomegaly  Ext: mi Musculoskeletal:  No deformities, BUE and BLE strength normal and equal Skin: warm and dry  Neuro:  CNs 2-12 intact, no focal abnormalities noted Psych:  Normal affect   EKG:  The EKG was personally reviewed and demonstrates: AV paced with wide LBBB.  Telemetry:  Telemetry was personally reviewed and demonstrates: paced with PVCs  Relevant CV Studies: Echo outside films: 07/22/21 Echo: SUMMARY  The left ventricular size is normal.  There is moderate concentric left ventricular hypertrophy.  Left ventricular systolic function is moderately reduced.  LV ejection fraction = 35-40%.  Abnormal (paradoxical) septal motion consistent with RV pacemaker.  There are regional wall motion abnormalities as specified below.  There is severe hypokinesis and thinning of the anterior,  anteroseptal, inferoseptal territory from mid to apex.  The right ventricle is normal in size and function.  The left atrium is severely dilated.  Diffuse thickening of the aortic valve with restricted cusp opening.  There is critically severe aortic stenosis.  There is moderate aortic regurgitation.  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  There is moderate tricuspid regurgitation. Moderate pulmonary  hypertension.  The aortic Sinus(es) of Valsalva are borderline dilated.  The IVC is normal in size  with an inspiratory collapse of greater then  50%, suggesting normal right atrial pressure.  There is no pericardial effusion.  Compared to prior study dated 12/30/20, AS is now critically severe.   -  FINDINGS:  LEFT VENTRICLE  The left ventricular size is normal. Upper septal hypertrophy (sigmoid  septum), normal variant. There is moderate concentric left ventricular  hypertrophy. Left ventricular systolic function is moderately reduced.  LV ejection fraction = 35-40%. LV Global L Strain =-8.3%. There is  severe [Grade III] diastolic dysfunction [restrictive filling  pattern], with elevated left atrial pressure. Mitral inflow  deceleration timeAbnormal<150 msec. Consistent with restrictive  physiology.. Abnormal (paradoxical) septal motion consistent with RV  pacemaker. There are regional wall motion abnormalities as specified  below. There is severe hypokinesis and thinning of the anterior,  anteroseptal, inferoseptal territory from mid to apex.   -  RIGHT VENTRICLE  The right ventricle is normal in size and function.   LEFT ATRIUM  The left atrium is severely dilated. LAVI 111.0 ml/m2.   RIGHT ATRIUM  Right atrial size is normal.  -  AORTIC VALVE  The aortic valve is trileaflet. Diffuse thickening of the aortic valve  with restricted cusp opening. There is critically severe aortic  stenosis. There is moderate aortic regurgitation. The calculated  aortic valve area using the continuity equation is 0.4 cm2. Aortic  valve mean pressure gradient is 53 mmHg. The peak aortic valve  velocity 449 cm/s.  -  MITRAL VALVE  There is no mitral regurgitation noted. The mean gradient across the  mitral valve is 3.5 mmHg. The heart rate for the mean mitral valve  gradient is 61 BPM. 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  -  TRICUSPID VALVE  Structurally normal tricuspid valve. There is moderate tricuspid  regurgitation. Estimated right ventricular  systolic pressure is 50  mmHg.  Moderate pulmonary hypertension.  -  PULMONIC VALVE  Structurally normal pulmonic valve. Trace pulmonic valvular  regurgitation.  -  ARTERIES  The aortic Sinus(es) of Valsalva are borderline dilated. The ascending  aorta is normal size. Mild pulmonary artery dilation. Measures 3.2 cm.   -  VENOUS  Pulmonary venous flow pattern not well visualized. The IVC is normal  in size with an inspiratory collapse of greater then 50%, suggesting  normal right atrial pressure.  -  EFFUSION  There is no pericardial effusion.  -  -   MMode/2D Measurements & Calculations  IVSd: 1.7 cm       LA dim: 6.0 cm    ESV(MOD-sp4):  Ao sinus diam:  LVIDd: 5.2 cm      EDV(MOD-sp4):     75.2 ml        3.8 cm  LVPWd: 1.3 cm      121.0 ml          EDV(MOD-sp2):  LVIDs: 3.7 cm                        169.0 ml                                       ESV(MOD-sp2):                                       113.0 ml          _______________________________________________________________________  asc Aorta Diam:    LVOT diam: 2.0 cm SV(MOD-sp4):   EF A4C: 37.9 %  3.6 cm                               45.8 ml                                       SI(MOD-sp4):                                       29.8 ml/m2          _______________________________________________________________________  LA area A2:        LA area A4:       LA ESV (BP):   LA ESV Index  40.9 cm2           41.2 cm2          171.0 ml       (A2C): 109.1 ml/m2           _______________________________________________________________________  LA ESV Index       LA ESV Index (BP):LA vol:        LA vol index:  (A4C): 111.0 ml/m2 111.0 ml/m2       183.4 ml       119.4 ml/m2           _______________________________________________________________________  SV A4C: 45.8 ml   Doppler Measurements & Calculations  MV E max vel:       MV V2 max:       MV P1/2t max  IN:[id number]):  138.0 cm/sec        166.8 cm/sec     162.4 cm/sec     45.3 ml  MV A max vel:        MV max PG:       MV P1/2t:        Ao V2 max:  59.2 cm/sec         11.1 mmHg        58.5 msec        491.2 cm/sec  MV E/A: 2.3         MV V2 mean:      MV dec time:     Ao max PG:  Med Peak E' Vel:    77.0 cm/sec      0.12 sec         96.5 mmHg  4.8 cm/sec          MV mean PG:                       Ao V2 mean:  Lat Peak E' Vel:    3.0 mmHg                          352.9 cm/sec  12.5 cm/sec         MV V2 VTI:                        Ao mean PG:  E/Lat E`: 11.0      37.9 cm                           57.4 mmHg  E/Med E`: 28.8      MVA(P1/2t):                       Ao V2 VTI:                      3.8 cm2                           144.2 cm                      MVA(VTI): 1.2 cm2                 AVA (VTI):                                                        0.31 cm2          _______________________________________________________________________  AI max vel:         LV V1 VTI:       TR max vel:      RAP systole:  401.4 cm/sec        14.7 cm          342.1 cm/sec     3.0 mmHg  AI dec slope:                        TR max PG:  335.9 cm/sec2  46.8 mmHg  AI P1/2t:                            RVSP(TR):  350.0 msec                           49.8 mmHg           _______________________________________________________________________  AS Dimensionless    AVAi(VTI)        SV index(LVOT):  Index (VTI): 0.10   cm^2/m^2:                                       29.5 ml/m2                      0.20 cm2     _____________________________   Cardiac CT 10/02/2021   CLINICAL DATA:  Aortic stenosis   EXAM: Cardiac TAVR CT   TECHNIQUE: The patient was scanned on a Siemens Force [id number] slice scanner. A 120 kV retrospective scan was triggered in the descending thoracic aorta at 111 HU's. Gantry rotation speed was 270 msecs and collimation was .9 mm. No beta blockade or nitro were given. The 3D data set was reconstructed in 5% intervals of the R-R cycle. Systolic and diastolic phases  were analyzed on a dedicated work station using MPR, MIP and VRT modes. The patient received 80 cc of contrast.   FINDINGS: Aortic Valve: Tri leaflet calcified with restricted leaflet motion Calcium score > 5000   Aorta: Heavily calcified mildly dilated ascending thoracic aorta severe calcific atherosclerosis   Sinotubular Junction: 27 mm   Ascending Thoracic Aorta: 39 mm   Descending Thoracic Aorta: 24 mm   Sinus of Valsalva Measurements:   Non-coronary: 34.8 mm   Right - coronary: 33.6 mm   Left - coronary: 32.4 mm   Coronary Artery Height above Annulus:   Left Main: 11.7 mm above annulus   Right Coronary: 11.1 mm above annulus   Virtual Basal Annulus Measurements:   Maximum/Minimum Diameter: 27 mm x 19.4 mm   Perimeter: 77.6 mm   Area: 441 mm2 30% phase   Coronary Arteries: Sufficient height above annulus for deployment   Optimum Fluoroscopic Angle for Delivery: LAO 12 Caudal 12 degrees   IMPRESSION: 1. Calcified tri-leaflet AV with score >5,000   2.  Annular area of 441 mm2 suitable for a 26 mm Sapien 3 valve   3. Normal appearing bileaflet mechanical MVR with no pannus and good opening /closing angles   4.  Mixing artifact in LAA no delayed images done   5.  Coronary arteries sufficient height above annulus for deployment   6. Optimum angiographic angle for deployment LAO 12 Caudal 12 degrees   7.  Patient appearing stent in LAD   8.  Pacing wires in RA/RV   9.  Dilated main PA 4.1 cm suggesting elevated PA systolic pressures   Jenkins Rouge     Electronically Signed   By: Jenkins Rouge M.D.   On: 10/02/2021 09:41  _____________________  CT abdomen/chest/pelvis 10/02/21 Narrative & Impression CLINICAL DATA:  75 year old female with history of severe aortic stenosis. Preprocedural study prior to potential transcatheter aortic valve replacement (TAVR) procedure.   EXAM: CT ANGIOGRAPHY CHEST, ABDOMEN AND PELVIS    TECHNIQUE: Multidetector CT imaging through the chest, abdomen and  pelvis was performed using the standard protocol during bolus administration of intravenous contrast. Multiplanar reconstructed images and MIPs were obtained and reviewed to evaluate the vascular anatomy.   CONTRAST:  OMNIPAQUE IOHEXOL 350 MG/ML SOLN   COMPARISON:  None.   FINDINGS: CTA CHEST FINDINGS   Cardiovascular: Heart size is severely enlarged. There is no significant pericardial fluid, thickening or pericardial calcification. There is aortic atherosclerosis, as well as atherosclerosis of the great vessels of the mediastinum and the coronary arteries, including calcified atherosclerotic plaque in the left main, left anterior descending, left circumflex and right coronary arteries. Myocardial thinning and subendocardial hypoattenuation throughout the mid to distal LAD territory, likely sequela of prior LAD territory myocardial infarction(s). This is associated with some mild aneurysmal dilatation of the left ventricular apex. Severe dilatation of the pulmonic trunk (4.3 cm in diameter), concerning for pulmonary arterial hypertension. Severe thickening calcification of the aortic valve. 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 Calcifications of the mitral annulus. Left-sided pacemaker device in place with lead tips terminating in the right atrial appendage and right ventricular apex.   Mediastinum/Lymph Nodes: No pathologically enlarged mediastinal or hilar lymph nodes. Hilar esophagus is unremarkable in appearance. No axillary lymphadenopathy.   Lungs/Pleura: Small bilateral pleural effusions (right greater than left) lying dependently. Scattered areas of subsegmental atelectasis are noted in the lower lungs, along with near complete atelectasis of the right middle lobe. There is a background of mild diffuse ground-glass attenuation and interlobular septal  thickening throughout the lungs bilaterally, suggesting the presence of mild interstitial pulmonary edema.   Musculoskeletal/Soft Tissues: Median sternotomy wires. There are no aggressive appearing lytic or blastic lesions noted in the visualized portions of the skeleton.   CTA ABDOMEN AND PELVIS FINDINGS   Hepatobiliary: Heterogeneous attenuation throughout the hepatic parenchyma, with large areas of low attenuation most evident in the right lobe of the liver, favored to reflect a background of heterogeneous hepatic steatosis. No definite focal cystic or solid hepatic lesions. No intra or extrahepatic biliary ductal dilatation. Status post cholecystectomy.   Pancreas: No pancreatic mass. No pancreatic ductal dilatation. No pancreatic or peripancreatic fluid collections or inflammatory changes.   Spleen: Small splenule inferior to the spleen. Otherwise, unremarkable.   Adrenals/Urinary Tract: Mild multifocal cortical thinning in both kidneys. Subcentimeter low-attenuation lesion in the upper pole of the left kidney, too small to characterize, but statistically likely to represent a tiny cyst. Bilateral adrenal glands are normal in appearance. No hydroureteronephrosis. Urinary bladder is normal in appearance.   Stomach/Bowel: The appearance of the stomach is normal. There is no pathologic dilatation of small bowel or colon. The appendix is not confidently identified and may be surgically absent. Regardless, there are no inflammatory changes noted adjacent to the cecum to suggest the presence of an acute appendicitis at this time.   Vascular/Lymphatic: Aortic atherosclerosis, with vascular findings and measurements pertinent to potential TAVR procedure, as detailed below. No aneurysm or dissection noted in the abdominal or pelvic vasculature. No lymphadenopathy identified in the abdomen or pelvis.   Reproductive: Uterus and ovaries are atrophic.   Other: No significant  volume of ascites.  No pneumoperitoneum.   Musculoskeletal: There are no aggressive appearing lytic or blastic lesions noted in the visualized portions of the skeleton.   VASCULAR MEASUREMENTS PERTINENT TO TAVR:   AORTA:   Minimal Aortic Diameter-13 x 14 mm   Severity of Aortic Calcification-moderate to severe   RIGHT PELVIS:   Right Common Iliac Artery -  Minimal Diameter-7.7 x 7.1 mm   Tortuosity-mild   Calcification-moderate   Right External Iliac Artery -   Minimal Diameter-7.1 x 7.1 mm   Tortuosity-mild-to-moderate   Calcification-none   Right Common Femoral Artery -   Minimal Diameter-6.5 x 6.3 mm   Tortuosity-mild   Calcification-none   LEFT PELVIS:   Left Common Iliac Artery -   Minimal Diameter-8.2 x 8.7 mm   Tortuosity-mild   Calcification-moderate   Left External Iliac Artery -   Minimal Diameter-6.3 x 7.2 mm   Tortuosity-mild   Calcification-none   Left Common Femoral Artery -   Minimal Diameter-6.8 x 6.8 mm   Tortuosity-mild   Calcification-none   Review of the MIP images confirms the above findings.   IMPRESSION: 1. Vascular findings and measurements pertinent to potential TAVR procedure, as detailed above. 2. Severe thickening calcification of the aortic valve, compatible with reported clinical history of severe aortic stenosis. 3. Cardiomegaly with findings suggestive of congestive heart failure, including evidence of mild interstitial pulmonary edema and small bilateral pleural effusions, as detailed above. 4. Aortic atherosclerosis, in addition to left main and 3 vessel coronary artery disease. There is also evidence of prior LAD territory myocardial infarction(s), as above. 5. Severe dilatation of the pulmonic trunk (4.3 cm in diameter), concerning for pulmonary arterial hypertension. 6. Heterogeneous hepatic steatosis. 7. Additional incidental findings, as above.    Laboratory Data:  High Sensitivity Troponin:    Recent Labs  Lab 09/30/21 1103 09/30/21 1302  TROPONINIHS 28* 23*     Chemistry Recent Labs  Lab 09/30/21 1103 10/01/21 0400 10/02/21 0241  NA 133* 132* 135  K 4.6 2.8* 3.5  CL 99 97* 100  CO2 24 26 24   GLUCOSE 117* 90 111*  BUN 18 15 14   CREATININE 1.27* 1.14* 1.14*  CALCIUM 9.2 8.5* 8.5*  GFRNONAA 44* 51* 51*  ANIONGAP 10 9 11     Recent Labs  Lab 09/30/21 1302  PROT 7.0  ALBUMIN 4.1  AST 26  ALT 17  ALKPHOS 84  BILITOT 1.3*   Hematology Recent Labs  Lab 09/30/21 1103 10/01/21 0400  WBC 6.3 4.7  RBC 4.28 3.76*  HGB 12.1 10.7*  HCT 38.3 33.5*  MCV 89.5 89.1  MCH 28.3 28.5  MCHC 31.6 31.9  RDW 16.3* 16.2*  PLT 189 147*   BNP Recent Labs  Lab 09/30/21 1103  BNP 2,815.3*    DDimer No results for input(s): DDIMER in the last 168 hours.   Radiology/Studies:  DG Chest 2 View  Result Date: 09/30/2021 CLINICAL DATA:  Shortness of breath EXAM: CHEST - 2 VIEW COMPARISON:  08/26/2021 FINDINGS: Cardiomegaly status post median sternotomy with mitral annulus prosthesis. Left chest multi lead pacer. Small, layering bilateral pleural effusions and associated atelectasis or consolidation. Exaggerated thoracic kyphosis. Disc degenerative disease of the thoracic spine. IMPRESSION: 1.  Cardiomegaly. 2. Small, layering bilateral pleural effusions and associated atelectasis or consolidation. Electronically Signed   By: Delanna Ahmadi M.D.   On: 09/30/2021 11:27   DG Abdomen 1 View  Result Date: 09/30/2021 CLINICAL DATA:  Shortness of breath, LEFT lower quadrant abdominal pain, unable to urinate since yesterday, constipation EXAM: ABDOMEN - 1 VIEW COMPARISON:  02/27/2014 FINDINGS: Nonobstructive bowel gas pattern. No bowel dilatation or bowel wall thickening. Bones demineralized with degenerative facet disease changes lower lumbar spine. Surgical clips RIGHT upper quadrant. No urinary tract calcifications. IMPRESSION: No acute abnormalities. Electronically Signed   By:  Lavonia Dana M.D.   On: 09/30/2021 11:29  CT CORONARY MORPH W/CTA COR W/SCORE W/CA W/CM &/OR WO/CM  Addendum Date: 10/02/2021   ADDENDUM REPORT: 10/02/2021 09:41 CLINICAL DATA:  Aortic stenosis EXAM: Cardiac TAVR CT TECHNIQUE: The patient was scanned on a Siemens Force [id number] slice scanner. A 120 kV retrospective scan was triggered in the descending thoracic aorta at 111 HU's. Gantry rotation speed was 270 msecs and collimation was .9 mm. No beta blockade or nitro were given. The 3D data set was reconstructed in 5% intervals of the R-R cycle. Systolic and diastolic phases were analyzed on a dedicated work station using MPR, MIP and VRT modes. The patient received 80 cc of contrast. FINDINGS: Aortic Valve: Tri leaflet calcified with restricted leaflet motion Calcium score > 5000 Aorta: Heavily calcified mildly dilated ascending thoracic aorta severe calcific atherosclerosis Sinotubular Junction: 27 mm Ascending Thoracic Aorta: 39 mm Descending Thoracic Aorta: 24 mm Sinus of Valsalva Measurements: Non-coronary: 34.8 mm Right - coronary: 33.6 mm Left - coronary: 32.4 mm Coronary Artery Height above Annulus: Left Main: 11.7 mm above annulus Right Coronary: 11.1 mm above annulus Virtual Basal Annulus Measurements: Maximum/Minimum Diameter: 27 mm x 19.4 mm Perimeter: 77.6 mm Area: 441 mm2 30% phase Coronary Arteries: Sufficient height above annulus for deployment Optimum Fluoroscopic Angle for Delivery: LAO 12 Caudal 12 degrees IMPRESSION: 1. Calcified tri-leaflet AV with score >5,000 2.  Annular area of 441 mm2 suitable for a 26 mm Sapien 3 valve 3. Normal appearing bileaflet mechanical MVR with no pannus and good opening /closing angles 4.  Mixing artifact in LAA no delayed images done 5.  Coronary arteries sufficient height above annulus for deployment 6. Optimum angiographic angle for deployment LAO 12 Caudal 12 degrees 7.  Patient appearing stent in LAD 8.  Pacing wires in RA/RV 9.  Dilated main PA 4.1 cm  suggesting elevated PA systolic pressures [place] Electronically Signed   By: Jenkins Rouge M.D.   On: 10/02/2021 09:41   Result Date: 10/02/2021 EXAM: OVER-READ INTERPRETATION  CT CHEST The following report is an over-read performed by radiologist Dr. Vinnie Langton of [facility], [place] on 10/02/2021. This over-read does not include interpretation of cardiac or coronary anatomy or pathology. The coronary calcium score/coronary CTA interpretation by the cardiologist is attached. COMPARISON:  Chest CT 07/22/2021. FINDINGS: Extracardiac findings will be described separately under dictation for contemporaneously obtained CTA chest, abdomen and pelvis. IMPRESSION: Please see separate dictation for contemporaneously obtained CTA chest, abdomen and pelvis dated 10/02/2021 for full description of relevant extracardiac findings. Electronically Signed: By: Vinnie Langton M.D. On: 10/02/2021 09:14     SSTS score Procedure: Isolated AVR Risk of Mortality: 4.169% Renal Failure: 2.054% Permanent Stroke: 0.725% Prolonged Ventilation: 13.176% DSW Infection: 0.143% Reoperation: 4.706% Morbidity or Mortality: 17.897% Short Length of Stay: 14.359% Long Length of Stay: 9.243%  ________________________  [facility] Cardiomyopathy Questionnaire  KCCQ-12 08/22/2021  1 a. Ability to shower/bathe Not at all limited  1 b. Ability to walk 1 block Moderately limited  1 c. Ability to hurry/jog Extremely limited  2. Edema feet/ankles/legs 3+ times a week, not every day  3. Limited by fatigue 1-2 times a week  4. Limited by dyspnea Several times a day  5. Sitting up / on 3+ pillows Never over the past 2 weeks  6. Limited enjoyment of life Moderately limited  7. Rest of life w/ symptoms Somewhat satisfied  8 a. Participation in hobbies Limited quite a bit  8 b. Participation in chores Moderately limited  8 c. Visiting family/friends  Moderately limited     Assessment and Plan:   [name] [name] is a 75 y.o. female with symptoms of severe, stage D1 aortic stenosis (with reduced EF) with NYHA Class IV symptoms currently admitted for acute heart failure. I have reviewed the patient's recent echocardiogram which is notable for reduced LV systolic function (EF3 [id number]) and critical aortic stenosis with peak velocity of 449 cm/s mm hg and mean transvalvular gradient of 53 mm hg despite low EF. The patient's calculated aortic valve area was 0.4 cm. There was mild to moderate AI and normal mitral mechanical prosthesis.   Cardiac gated CTA of the heart revealed anatomical characteristics consistent with aortic stenosis suitable for treatment by transcatheter aortic valve replacement without any significant complicating features and CTA of the aorta and iliac vessels demonstrated what appear to be adequate pelvic vascular access to facilitate a transfemoral approach. Her coumadin was transitioned to heparin with plan to proceed with L/RHC on 10/03/21. INR 2.0 today. On heparin gtt and coumadin held.    I have reviewed the natural history of aortic stenosis with the patient. We have discussed the limitations of medical therapy and the poor prognosis associated with symptomatic aortic stenosis. We have reviewed potential treatment options, including palliative medical therapy, conventional surgical aortic valve replacement, and transcatheter aortic valve replacement. We discussed treatment options in the context of this patient's specific comorbid medical conditions.    The patient's predicted risk of mortality with conventional aortic valve replacement is 4.169% primarily based on age, obesity, previous open surgery with mechanical mitral valve, previous pacemaker, CAD with previous MI and obesity, severe LV dysfunction, COPD, recurrent CHF exacerbations, and anemia. This is the patients second admission for heart failure since November. With her critical AS and severe LV dysfunction, we would like  to get her done as soon as possible. Will post her for 12/27. She will need to be directly admitted 72 hours ahead of time for heparin bridging. Will plan for direct admission on 12/24.  Dr. Cyndia Bent to follow.     For questions or updates, please contact [place] Please consult [email or web address] for contact info under    Signed, Angelena Form, PA-C  10/02/2021 9:52 AM   Chart reviewed, patient examined, agree with above. The patient is a 75 year old woman with a history of rheumatic heart disease status post mechanical mitral valve replacement about 20 years ago at [facility] by Dr. Anne Shutter.  She has been treated with long-term Coumadin for her mechanical valve and PAF.  She had a permanent pacemaker placed in 2009.  She also has a history of coronary disease with MI and prior stenting.  She now presents with progressive exertional shortness of breath and fatigue as well as lower extremity edema.  Her most recent echo in October 2022 showed reduced ejection fraction of 30 to 35%.  The aortic valve was severely calcified with restricted leaflet mobility with a mean gradient of 53 mmHg and a valve area of 0.5 cm consistent with critical aortic stenosis.  There was mild to moderate aortic insufficiency.  She started her TAVR work-up and was supposed to see me yesterday in the office after her scans but was unable to lay flat on the CT scan table and was felt to be volume overloaded and admitted for intravenous diuresis.  She feels much better.  Her gated cardiac CTA this morning shows anatomy suitable for a 26 mm SAPIEN 3 valve.  There was a patent stent in the LAD.  There is diffuse calcification of the ascending aorta particularly medially that extends from the sinotubular junction up to the aortic arch.  Her abdominal and pelvic CTA shows adequate pelvic vascular anatomy to allow transfemoral insertion.  She is scheduled for cardiac catheterization tomorrow.  I agree that aortic valve  replacement is indicated in this patient for relief of her symptoms and to prevent further left ventricular deterioration.  I do not think she is a candidate for open surgical AVR given the diffuse calcification of her ascending aorta as well as her age with prior mitral valve replacement.  I think TAVR would be a reasonable alternative for treating her.  The patient was counseled at length regarding treatment alternatives for management of severe symptomatic aortic stenosis. The risks and benefits of surgical intervention has been discussed in detail. Long-term prognosis with medical therapy was discussed. Alternative approaches such as conventional surgical aortic valve replacement, transcatheter aortic valve replacement, and palliative medical therapy were compared and contrasted at length. This discussion was placed in the context of the patient's own specific clinical presentation and past medical history. All of her questions have been addressed.   Following the decision to proceed with transcatheter aortic valve replacement, a discussion was held regarding what types of management strategies would be attempted intraoperatively in the event of life-threatening complications, including whether or not the patient would be considered a candidate for the use of cardiopulmonary bypass and/or conversion to open sternotomy for attempted surgical intervention.  She is not a candidate for emergent sternotomy to manage any intraoperative complications.  The patient is aware of the fact that transient use of cardiopulmonary bypass may be necessary. The patient has been advised of a variety of complications that might develop including but not limited to risks of death, stroke, paravalvular leak, aortic dissection or other major vascular complications, aortic annulus rupture, device embolization, cardiac rupture or perforation, mitral regurgitation, acute myocardial infarction, arrhythmia, heart block or bradycardia  requiring permanent pacemaker placement, congestive heart failure, respiratory failure, renal failure, pneumonia, infection, other late complications related to structural valve deterioration or migration, or other complications that might ultimately cause a temporary or permanent loss of functional independence or other long term morbidity. The patient provides full informed consent for the procedure as described and all questions were answered.    Alleen Borne, MD

## 2021-10-02 NOTE — Progress Notes (Signed)
Pt has been NPO since midnight for CT scans this morning.

## 2021-10-02 NOTE — Progress Notes (Signed)
Primary cardiolgist :  Burt Knack  Subjective:  Denies SSCP, palpitations or Dyspnea  Objective:  Vitals:   10/01/21 2031 10/02/21 0001 10/02/21 0557 10/02/21 0807  BP: (!) 115/53 (!) 103/53 (!) 117/50 (!) 123/53  Pulse: 61 (!) 59 63 60  Resp: 15 (!) 24 15 18   Temp: 98.6 F (37 C) 98.3 F (36.8 C) 97.6 F (36.4 C) 98.6 F (37 C)  TempSrc: Oral Oral Oral Oral  SpO2: 97% 92% 93% 92%  Weight:  54.4 kg      Intake/Output from previous day:  Intake/Output Summary (Last 24 hours) at 10/02/2021 1042 Last data filed at 10/02/2021 0536 Gross per 24 hour  Intake 845 ml  Output 1450 ml  Net -605 ml    Physical Exam:  Short white female JVP elevated PPM under left clavicle Post sternotomy S1 click severe AS murmur decreased S2 Plus one edema Abdomen benign   Lab Results: Basic Metabolic Panel: Recent Labs    10/01/21 0400 10/02/21 0241  NA 132* 135  K 2.8* 3.5  CL 97* 100  CO2 26 24  GLUCOSE 90 111*  BUN 15 14  CREATININE 1.14* 1.14*  CALCIUM 8.5* 8.5*   Liver Function Tests: Recent Labs    09/30/21 1302  AST 26  ALT 17  ALKPHOS 84  BILITOT 1.3*  PROT 7.0  ALBUMIN 4.1   No results for input(s): LIPASE, AMYLASE in the last 72 hours. CBC: Recent Labs    09/30/21 1103 10/01/21 0400  WBC 6.3 4.7  NEUTROABS 5.0  --   HGB 12.1 10.7*  HCT 38.3 33.5*  MCV 89.5 89.1  PLT 189 147*    Anemia Panel: No results for input(s): VITAMINB12, FOLATE, FERRITIN, TIBC, IRON, RETICCTPCT in the last 72 hours.  Imaging: DG Chest 2 View  Result Date: 09/30/2021 CLINICAL DATA:  Shortness of breath EXAM: CHEST - 2 VIEW COMPARISON:  08/26/2021 FINDINGS: Cardiomegaly status post median sternotomy with mitral annulus prosthesis. Left chest multi lead pacer. Small, layering bilateral pleural effusions and associated atelectasis or consolidation. Exaggerated thoracic kyphosis. Disc degenerative disease of the thoracic spine. IMPRESSION: 1.  Cardiomegaly. 2. Small, layering  bilateral pleural effusions and associated atelectasis or consolidation. Electronically Signed   By: Delanna Ahmadi M.D.   On: 09/30/2021 11:27   DG Abdomen 1 View  Result Date: 09/30/2021 CLINICAL DATA:  Shortness of breath, LEFT lower quadrant abdominal pain, unable to urinate since yesterday, constipation EXAM: ABDOMEN - 1 VIEW COMPARISON:  02/27/2014 FINDINGS: Nonobstructive bowel gas pattern. No bowel dilatation or bowel wall thickening. Bones demineralized with degenerative facet disease changes lower lumbar spine. Surgical clips RIGHT upper quadrant. No urinary tract calcifications. IMPRESSION: No acute abnormalities. Electronically Signed   By: Lavonia Dana M.D.   On: 09/30/2021 11:29   CT CORONARY MORPH W/CTA COR W/SCORE W/CA W/CM &/OR WO/CM  Addendum Date: 10/02/2021   ADDENDUM REPORT: 10/02/2021 09:41 CLINICAL DATA:  Aortic stenosis EXAM: Cardiac TAVR CT TECHNIQUE: The patient was scanned on a Siemens Force [id number] slice scanner. A 120 kV retrospective scan was triggered in the descending thoracic aorta at 111 HU's. Gantry rotation speed was 270 msecs and collimation was .9 mm. No beta blockade or nitro were given. The 3D data set was reconstructed in 5% intervals of the R-R cycle. Systolic and diastolic phases were analyzed on a dedicated work station using MPR, MIP and VRT modes. The patient received 80 cc of contrast. FINDINGS: Aortic Valve: Tri leaflet calcified with restricted leaflet motion Calcium  score > 5000 Aorta: Heavily calcified mildly dilated ascending thoracic aorta severe calcific atherosclerosis Sinotubular Junction: 27 mm Ascending Thoracic Aorta: 39 mm Descending Thoracic Aorta: 24 mm Sinus of Valsalva Measurements: Non-coronary: 34.8 mm Right - coronary: 33.6 mm Left - coronary: 32.4 mm Coronary Artery Height above Annulus: Left Main: 11.7 mm above annulus Right Coronary: 11.1 mm above annulus Virtual Basal Annulus Measurements: Maximum/Minimum Diameter: 27 mm x 19.4 mm Perimeter:  77.6 mm Area: 441 mm2 30% phase Coronary Arteries: Sufficient height above annulus for deployment Optimum Fluoroscopic Angle for Delivery: LAO 12 Caudal 12 degrees IMPRESSION: 1. Calcified tri-leaflet AV with score >5,000 2.  Annular area of 441 mm2 suitable for a 26 mm Sapien 3 valve 3. Normal appearing bileaflet mechanical MVR with no pannus and good opening /closing angles 4.  Mixing artifact in LAA no delayed images done 5.  Coronary arteries sufficient height above annulus for deployment 6. Optimum angiographic angle for deployment LAO 12 Caudal 12 degrees 7.  Patient appearing stent in LAD 8.  Pacing wires in RA/RV 9.  Dilated main PA 4.1 cm suggesting elevated PA systolic pressures Jenkins Rouge Electronically Signed   By: Jenkins Rouge M.D.   On: 10/02/2021 09:41   Result Date: 10/02/2021 EXAM: OVER-READ INTERPRETATION  CT CHEST The following report is an over-read performed by radiologist Dr. Vinnie Langton of [facility], [place] on 10/02/2021. This over-read does not include interpretation of cardiac or coronary anatomy or pathology. The coronary calcium score/coronary CTA interpretation by the cardiologist is attached. COMPARISON:  Chest CT 07/22/2021. FINDINGS: Extracardiac findings will be described separately under dictation for contemporaneously obtained CTA chest, abdomen and pelvis. IMPRESSION: Please see separate dictation for contemporaneously obtained CTA chest, abdomen and pelvis dated 10/02/2021 for full description of relevant extracardiac findings. Electronically Signed: By: Vinnie Langton M.D. On: 10/02/2021 09:14   CT ANGIO CHEST AORTA W/CM & OR WO/CM  Result Date: 10/02/2021 CLINICAL DATA:  75 year old female with history of severe aortic stenosis. Preprocedural study prior to potential transcatheter aortic valve replacement (TAVR) procedure. EXAM: CT ANGIOGRAPHY CHEST, ABDOMEN AND PELVIS TECHNIQUE: Multidetector CT imaging through the chest, abdomen and pelvis was performed  using the standard protocol during bolus administration of intravenous contrast. Multiplanar reconstructed images and MIPs were obtained and reviewed to evaluate the vascular anatomy. CONTRAST:  12mL OMNIPAQUE IOHEXOL 350 MG/ML SOLN COMPARISON:  None. FINDINGS: CTA CHEST FINDINGS Cardiovascular: Heart size is severely enlarged. There is no significant pericardial fluid, thickening or pericardial calcification. There is aortic atherosclerosis, as well as atherosclerosis of the great vessels of the mediastinum and the coronary arteries, including calcified atherosclerotic plaque in the left main, left anterior descending, left circumflex and right coronary arteries. Myocardial thinning and subendocardial hypoattenuation throughout the mid to distal LAD territory, likely sequela of prior LAD territory myocardial infarction(s). This is associated with some mild aneurysmal dilatation of the left ventricular apex. Severe dilatation of the pulmonic trunk (4.3 cm in diameter), concerning for pulmonary arterial hypertension. Severe thickening calcification of the aortic valve. 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 Calcifications of the mitral annulus. Left-sided pacemaker device in place with lead tips terminating in the right atrial appendage and right ventricular apex. Mediastinum/Lymph Nodes: No pathologically enlarged mediastinal or hilar lymph nodes. Hilar esophagus is unremarkable in appearance. No axillary lymphadenopathy. Lungs/Pleura: Small bilateral pleural effusions (right greater than left) lying dependently. Scattered areas of subsegmental atelectasis are noted in the lower lungs, along with near complete atelectasis of the right middle  lobe. There is a background of mild diffuse ground-glass attenuation and interlobular septal thickening throughout the lungs bilaterally, suggesting the presence of mild interstitial pulmonary edema. Musculoskeletal/Soft Tissues: Median sternotomy  wires. There are no aggressive appearing lytic or blastic lesions noted in the visualized portions of the skeleton. CTA ABDOMEN AND PELVIS FINDINGS Hepatobiliary: Heterogeneous attenuation throughout the hepatic parenchyma, with large areas of low attenuation most evident in the right lobe of the liver, favored to reflect a background of heterogeneous hepatic steatosis. No definite focal cystic or solid hepatic lesions. No intra or extrahepatic biliary ductal dilatation. Status post cholecystectomy. Pancreas: No pancreatic mass. No pancreatic ductal dilatation. No pancreatic or peripancreatic fluid collections or inflammatory changes. Spleen: Small splenule inferior to the spleen. Otherwise, unremarkable. Adrenals/Urinary Tract: Mild multifocal cortical thinning in both kidneys. Subcentimeter low-attenuation lesion in the upper pole of the left kidney, too small to characterize, but statistically likely to represent a tiny cyst. Bilateral adrenal glands are normal in appearance. No hydroureteronephrosis. Urinary bladder is normal in appearance. Stomach/Bowel: The appearance of the stomach is normal. There is no pathologic dilatation of small bowel or colon. The appendix is not confidently identified and may be surgically absent. Regardless, there are no inflammatory changes noted adjacent to the cecum to suggest the presence of an acute appendicitis at this time. Vascular/Lymphatic: Aortic atherosclerosis, with vascular findings and measurements pertinent to potential TAVR procedure, as detailed below. No aneurysm or dissection noted in the abdominal or pelvic vasculature. No lymphadenopathy identified in the abdomen or pelvis. Reproductive: Uterus and ovaries are atrophic. Other: No significant volume of ascites.  No pneumoperitoneum. Musculoskeletal: There are no aggressive appearing lytic or blastic lesions noted in the visualized portions of the skeleton. VASCULAR MEASUREMENTS PERTINENT TO TAVR: AORTA: Minimal  Aortic Diameter-13 x 14 mm Severity of Aortic Calcification-moderate to severe RIGHT PELVIS: Right Common Iliac Artery - Minimal Diameter-7.7 x 7.1 mm Tortuosity-mild Calcification-moderate Right External Iliac Artery - Minimal Diameter-7.1 x 7.1 mm Tortuosity-mild-to-moderate Calcification-none Right Common Femoral Artery - Minimal Diameter-6.5 x 6.3 mm Tortuosity-mild Calcification-none LEFT PELVIS: Left Common Iliac Artery - Minimal Diameter-8.2 x 8.7 mm Tortuosity-mild Calcification-moderate Left External Iliac Artery - Minimal Diameter-6.3 x 7.2 mm Tortuosity-mild Calcification-none Left Common Femoral Artery - Minimal Diameter-6.8 x 6.8 mm Tortuosity-mild Calcification-none Review of the MIP images confirms the above findings. IMPRESSION: 1. Vascular findings and measurements pertinent to potential TAVR procedure, as detailed above. 2. Severe thickening calcification of the aortic valve, compatible with reported clinical history of severe aortic stenosis. 3. Cardiomegaly with findings suggestive of congestive heart failure, including evidence of mild interstitial pulmonary edema and small bilateral pleural effusions, as detailed above. 4. Aortic atherosclerosis, in addition to left main and 3 vessel coronary artery disease. There is also evidence of prior LAD territory myocardial infarction(s), as above. 5. Severe dilatation of the pulmonic trunk (4.3 cm in diameter), concerning for pulmonary arterial hypertension. 6. Heterogeneous hepatic steatosis. 7. Additional incidental findings, as above. Electronically Signed   By: Trudie Reed M.D.   On: 10/02/2021 10:12   CT ANGIO ABDOMEN PELVIS  W &/OR WO CONTRAST  Result Date: 10/02/2021 CLINICAL DATA:  74 year old female with history of severe aortic stenosis. Preprocedural study prior to potential transcatheter aortic valve replacement (TAVR) procedure. EXAM: CT ANGIOGRAPHY CHEST, ABDOMEN AND PELVIS TECHNIQUE: Multidetector CT imaging through the chest,  abdomen and pelvis was performed using the standard protocol during bolus administration of intravenous contrast. Multiplanar reconstructed images and MIPs were obtained and reviewed to evaluate the vascular  anatomy. CONTRAST:  OMNIPAQUE IOHEXOL 350 MG/ML SOLN COMPARISON:  None. FINDINGS: CTA CHEST FINDINGS Cardiovascular: Heart size is severely enlarged. There is no significant pericardial fluid, thickening or pericardial calcification. There is aortic atherosclerosis, as well as atherosclerosis of the great vessels of the mediastinum and the coronary arteries, including calcified atherosclerotic plaque in the left main, left anterior descending, left circumflex and right coronary arteries. Myocardial thinning and subendocardial hypoattenuation throughout the mid to distal LAD territory, likely sequela of prior LAD territory myocardial infarction(s). This is associated with some mild aneurysmal dilatation of the left ventricular apex. Severe dilatation of the pulmonic trunk (4.3 cm in diameter), concerning for pulmonary arterial hypertension. Severe thickening calcification of the aortic valve. 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 Calcifications of the mitral annulus. Left-sided pacemaker device in place with lead tips terminating in the right atrial appendage and right ventricular apex. Mediastinum/Lymph Nodes: No pathologically enlarged mediastinal or hilar lymph nodes. Hilar esophagus is unremarkable in appearance. No axillary lymphadenopathy. Lungs/Pleura: Small bilateral pleural effusions (right greater than left) lying dependently. Scattered areas of subsegmental atelectasis are noted in the lower lungs, along with near complete atelectasis of the right middle lobe. There is a background of mild diffuse ground-glass attenuation and interlobular septal thickening throughout the lungs bilaterally, suggesting the presence of mild interstitial pulmonary edema.  Musculoskeletal/Soft Tissues: Median sternotomy wires. There are no aggressive appearing lytic or blastic lesions noted in the visualized portions of the skeleton. CTA ABDOMEN AND PELVIS FINDINGS Hepatobiliary: Heterogeneous attenuation throughout the hepatic parenchyma, with large areas of low attenuation most evident in the right lobe of the liver, favored to reflect a background of heterogeneous hepatic steatosis. No definite focal cystic or solid hepatic lesions. No intra or extrahepatic biliary ductal dilatation. Status post cholecystectomy. Pancreas: No pancreatic mass. No pancreatic ductal dilatation. No pancreatic or peripancreatic fluid collections or inflammatory changes. Spleen: Small splenule inferior to the spleen. Otherwise, unremarkable. Adrenals/Urinary Tract: Mild multifocal cortical thinning in both kidneys. Subcentimeter low-attenuation lesion in the upper pole of the left kidney, too small to characterize, but statistically likely to represent a tiny cyst. Bilateral adrenal glands are normal in appearance. No hydroureteronephrosis. Urinary bladder is normal in appearance. Stomach/Bowel: The appearance of the stomach is normal. There is no pathologic dilatation of small bowel or colon. The appendix is not confidently identified and may be surgically absent. Regardless, there are no inflammatory changes noted adjacent to the cecum to suggest the presence of an acute appendicitis at this time. Vascular/Lymphatic: Aortic atherosclerosis, with vascular findings and measurements pertinent to potential TAVR procedure, as detailed below. No aneurysm or dissection noted in the abdominal or pelvic vasculature. No lymphadenopathy identified in the abdomen or pelvis. Reproductive: Uterus and ovaries are atrophic. Other: No significant volume of ascites.  No pneumoperitoneum. Musculoskeletal: There are no aggressive appearing lytic or blastic lesions noted in the visualized portions of the skeleton. VASCULAR  MEASUREMENTS PERTINENT TO TAVR: AORTA: Minimal Aortic Diameter-13 x 14 mm Severity of Aortic Calcification-moderate to severe RIGHT PELVIS: Right Common Iliac Artery - Minimal Diameter-7.7 x 7.1 mm Tortuosity-mild Calcification-moderate Right External Iliac Artery - Minimal Diameter-7.1 x 7.1 mm Tortuosity-mild-to-moderate Calcification-none Right Common Femoral Artery - Minimal Diameter-6.5 x 6.3 mm Tortuosity-mild Calcification-none LEFT PELVIS: Left Common Iliac Artery - Minimal Diameter-8.2 x 8.7 mm Tortuosity-mild Calcification-moderate Left External Iliac Artery - Minimal Diameter-6.3 x 7.2 mm Tortuosity-mild Calcification-none Left Common Femoral Artery - Minimal Diameter-6.8 x 6.8 mm Tortuosity-mild Calcification-none Review of the MIP images  confirms the above findings. IMPRESSION: 1. Vascular findings and measurements pertinent to potential TAVR procedure, as detailed above. 2. Severe thickening calcification of the aortic valve, compatible with reported clinical history of severe aortic stenosis. 3. Cardiomegaly with findings suggestive of congestive heart failure, including evidence of mild interstitial pulmonary edema and small bilateral pleural effusions, as detailed above. 4. Aortic atherosclerosis, in addition to left main and 3 vessel coronary artery disease. There is also evidence of prior LAD territory myocardial infarction(s), as above. 5. Severe dilatation of the pulmonic trunk (4.3 cm in diameter), concerning for pulmonary arterial hypertension. 6. Heterogeneous hepatic steatosis. 7. Additional incidental findings, as above. Electronically Signed   By: Vinnie Langton M.D.   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 Feeling better with diuresis .  AS:  read her TAVR CT this am suitable for a 26 mm Sapient 3 valve structural team following MVR:  normal function on CT no pannus/thrombus low mean gradient on CT transitioned to heparin INR 2.0 CAD:  distant stent to LAD It is patent on CT but needs right and left cath in am to assess cors for TAVR. Note previous cath unsuccessful from right radial would use RFA or left wrist  Jenkins Rouge 10/02/2021, 10:42 AM

## 2021-10-02 NOTE — Progress Notes (Signed)
Patient continuously complaining of having difficulty swallowing and states "everything feels like it gets stuck". Multiple bedside swallowing techniques reviewed with patient such as sitting up 90 degrees, small bites, chin tuck while swallowing without any relief. Speech therapy consult ordered for further recommendation.

## 2021-10-02 NOTE — Progress Notes (Signed)
ANTICOAGULATION CONSULT NOTE - Follow Up Consult  Pharmacy Consult for IV Heparin (Warfarin on Hold) Indication:  Mechanical Valve  Allergies  Allergen Reactions   Atenolol Other (See Comments)    HR and pulse dropped   Metoprolol Succinate [Metoprolol] Other (See Comments)    HR and pulse dropped   Baclofen Anxiety and Other (See Comments)    Makes the patient feel anxious the day after taking this   Latex Rash    Patient Measurements: Vital Signs: Temp: 98.6 F (37 C) (12/15 0807) Temp Source: Oral (12/15 0807) BP: 123/53 (12/15 0807) Pulse Rate: 60 (12/15 0807)  Labs: Recent Labs    09/30/21 1103 09/30/21 1302 09/30/21 1557 10/01/21 0400 10/02/21 0241 10/02/21 1757  HGB 12.1  --   --  10.7*  --   --   HCT 38.3  --   --  33.5*  --   --   PLT 189  --   --  147*  --   --   LABPROT  --   --  32.2* 28.8* 22.7*  --   INR  --   --  3.1* 2.7* 2.0*  --   HEPARINUNFRC  --   --   --   --   --  0.40  CREATININE 1.27*  --   --  1.14* 1.14*  --   TROPONINIHS 28* 23*  --   --   --   --      Estimated Creatinine Clearance: 31.8 mL/min (A) (by C-G formula based on SCr of 1.14 mg/dL (H)).   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   Qualifier: Diagnosis of  By: Caryl Comes, MD, Remus Blake    Rheumatic heart disease    With mitral valve replacement, tricuspid regurgitation moderate to severe, significant left atrial enlargment   TRICUSPID REGURGITATION 12/23/2010   Qualifier: Diagnosis of   By: Caryl Comes, MD, Remus Blake    Unspecified essential hypertension 07/31/2010   Qualifier: Diagnosis of  By: Caryl Comes, MD, Remus Blake     Assessment: 75 yr old woman with hx of chronic systolic CHF, severe AS undergoing evaluation for TAVR; 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 Patient presented with urinary retention, SOB, and weakness. Pharmacy was consulted for heparin dosing for mechanical mitral valve.  PTA warfarin regimen:  7.5 mg daily (last dose 12/11; per patient, INR was > 5 at that time). INR was 3.1 on admission.  INR is down to 2 this morning.  Pharmacy asked to begin IV heparin while patient awaiting cath tomorrow.  Initial heparin level ~8.5 hrs after starting heparin infusion at 750 units/hr was 0.40 units/ml, which is within the goal range for this pt. 12/14 H/H 10.7/33.5, plt 147 (down from admission on 12/13; no CBC done today). Per RN, no issues with IV or bleeding observed.  Goal of Therapy:  Heparin level 0.3-0.7  units/ml (INR 2.5-3.5 for warfarin) Monitor platelets by anticoagulation protocol: Yes  Plan:  Continue heparin infusion at 750 units/hr Check confirmatory heparin level in 8 hrs Monitor daily heparin level, CBC, INR Monitor for bleeding F/U after cath tomorrow  Vicki Mallet, PharmD, BCPS, [facility] Clinical Pharmacist 10/02/2021 6:49 PM

## 2021-10-02 NOTE — Progress Notes (Signed)
Initial Nutrition Assessment  DOCUMENTATION CODES:  Non-severe (moderate) malnutrition in context of chronic illness  INTERVENTION:  Recommend diet consistency per SLP recommendation.  Add Ensure Enlive po TID, each supplement provides 350 kcal and 20 grams of protein, thickened to the ordered diet consistency.  Add MVI with minerals daily.  Encourage PO and supplement intake.  NUTRITION DIAGNOSIS:  Moderate Malnutrition related to chronic illness (CHF) as evidenced by moderate fat depletion, moderate muscle depletion.  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 She was previously admitted from 11/8-12 with influenza A and CHF exacerbation. Admitted with acute on chronic combined CHF.  Spoke with pt at bedside. Attempted to discuss usual PO intake and weight changes with pt. Pt very occupied with feeling like one of her pills was stuck in her throat.   RN ordered speech evaluation per RN note with the difficulty swallowing.  Per Epic, pt ate 35% of dinner last night.   She denies any recent changes in her weight. Pt's weight stable for the past 7 months per Epic.  Of note, pt with moderate BLE edema.  Medications: reviewed; colace BID, Lasix BID, Synthroid, Klor-Con 40 mEq, oxycodone PO PRN (given once today)  Labs: reviewed; Glucose 111 (H)  NUTRITION - FOCUSED PHYSICAL EXAM: Flowsheet Row Most Recent Value  Orbital Region Mild depletion  Upper Arm Region Moderate depletion  Thoracic and Lumbar Region No depletion  Buccal Region Moderate  depletion  Temple Region Mild depletion  Clavicle Bone Region Mild depletion  Clavicle and Acromion Bone Region Mild depletion  Scapular Bone Region No depletion  Dorsal Hand Mild depletion  Patellar Region Mild depletion  Anterior Thigh Region Mild depletion  Posterior Calf Region Mild depletion  Edema (RD Assessment) Moderate  [BLE]  Hair Reviewed  Eyes Reviewed  Mouth Reviewed  Skin Reviewed  Nails Reviewed   Diet Order:   Diet Order             Diet Heart Room service appropriate? Yes; Fluid consistency: Thin  Diet effective now                  EDUCATION NEEDS:  Education needs have been addressed  Skin:  Skin Assessment: Reviewed RN Assessment  Last BM:  10/01/21 - x2, Type 2, small  Height:  Ht Readings from Last 1 Encounters:  10/02/21 4\' 10"  (1.473 m)   Weight:  Wt Readings from Last 1 Encounters:  10/02/21 54.8 kg   BMI:  Body mass index is 25.25 kg/m.  Estimated Nutritional Needs:  Kcal:  1650-1850 Protein:  70-85 grams Fluid:  >1.65 L  10/04/21, RD, LDN (she/her/hers) Clinical Inpatient Dietitian RD Pager/After-Hours/Weekend Pager # in [place]

## 2021-10-02 NOTE — Progress Notes (Signed)
ANTICOAGULATION CONSULT NOTE   Pharmacy Consult for Heparin (warfarin on hold) Indication:  Mechanical Valve  Allergies  Allergen Reactions   Atenolol Other (See Comments)    HR and pulse dropped   Metoprolol Succinate [Metoprolol] Other (See Comments)    HR and pulse dropped   Baclofen Anxiety and Other (See Comments)    Makes the patient feel anxious the day after taking this   Latex Rash    Patient Measurements: Vital Signs: Temp: 98.6 F (37 C) (12/15 0807) Temp Source: Oral (12/15 0807) BP: 123/53 (12/15 0807) Pulse Rate: 60 (12/15 0807)  Labs: Recent Labs    09/30/21 1103 09/30/21 1302 09/30/21 1557 10/01/21 0400 10/02/21 0241  HGB 12.1  --   --  10.7*  --   HCT 38.3  --   --  33.5*  --   PLT 189  --   --  147*  --   LABPROT  --   --  32.2* 28.8* 22.7*  INR  --   --  3.1* 2.7* 2.0*  CREATININE 1.27*  --   --  1.14* 1.14*  TROPONINIHS 28* 23*  --   --   --      Estimated Creatinine Clearance: 31.6 mL/min (A) (by C-G formula based on SCr of 1.14 mg/dL (H)).   Medical History: Past Medical History:  Diagnosis Date   Anxiety    Aortic aneurysm [facility])    Atrial arrhythmia    With prior history of atrial flutter ablation   Bradycardia    s/p pacemaker implantation   CONGESTIVE HEART FAILURE 07/31/2010   Qualifier: Diagnosis of  By: Graciela Husbands, MD, Susie Cassette    GERD (gastroesophageal reflux disease)    Hypertension    Hypothyroidism    Long Q-T syndrome    Myocardial infarct (HCC) 11/20/2011   Stent placement for 100% blockage of LAD   PACEMAKER, 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   Qualifier: Diagnosis of  By: Graciela Husbands, MD, Susie Cassette    Rheumatic heart disease    With mitral valve replacement, tricuspid regurgitation moderate to severe, significant left atrial enlargment   TRICUSPID REGURGITATION 12/23/2010   Qualifier: Diagnosis of  By: Graciela Husbands, MD, Susie Cassette    Unspecified essential hypertension 07/31/2010   Qualifier: Diagnosis of  By:  Graciela Husbands, MD, Susie Cassette     Medications:    Infusions:    Assessment: Patient is a 11 yof with a history of chronic systolic CHF; severe AS undergoing evaluation for TAVR; 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 Patient presenting with urinary retention, SOB, and weakness. Heparin per pharmacy consult placed for mechanical mitral valve.  Patient taking warfarin PTA. Home dose is 7.5 mg daily. Last dose 12/11. Per patient INR was > 5 at that time.  INR down to 2 this morning.  Pharmacy asked to begin IV heparin while patient awaiting cath lab tomorrow.  Goal of Therapy:  Heparin level 0.3-0.7 units/ml (INR 2.5-3.5 for warfarin) Monitor platelets by anticoagulation protocol: Yes  Plan:  Start IV heparin at 750 units/hr. Check heparin level in 8 hrs. Daily heparin level and CBC. F/u plans to resume heparin post-cath tomorrow.  Reece Leader, Colon Flattery, BCCP Clinical Pharmacist  10/02/2021 8:51 AM   [facility] pharmacy phone numbers are listed on [email or web address]

## 2021-10-02 NOTE — Progress Notes (Signed)
HOSPITAL MEDICINE OVERNIGHT EVENT NOTE    Nursing reports the patient has been repeatedly complaining of "pill" stuck at the base of her esophagus throughout the day.  Patient happens to have had CT imaging of the chest abdomen pelvis performed today revealing no evidence of an impacted pill or food bolus in the esophagus.  Dose of Maalox administered which seems to have significantly improved the patient's symptoms.  Chart reviewed, seems that patient has a history of gastroesophageal reflux disease but is currently not on a PPI.  We will go ahead and place patient back on daily Protonix therapy first dose this evening.  Marinda Elk  MD [facility]

## 2021-10-03 ENCOUNTER — Encounter (HOSPITAL_COMMUNITY): Payer: Self-pay | Admitting: Cardiovascular Disease

## 2021-10-03 ENCOUNTER — Encounter: Payer: Self-pay | Admitting: Physician Assistant

## 2021-10-03 ENCOUNTER — Inpatient Hospital Stay (HOSPITAL_COMMUNITY)
Admission: EM | Disposition: A | Discharge status: Home / Self Care | Payer: Self-pay | Source: Home / Self Care | Attending: Internal Medicine

## 2021-10-03 ENCOUNTER — Other Ambulatory Visit (HOSPITAL_COMMUNITY): Payer: Self-pay

## 2021-10-03 DIAGNOSIS — I2510 Atherosclerotic heart disease of native coronary artery without angina pectoris: Secondary | ICD-10-CM

## 2021-10-03 HISTORY — PX: RIGHT/LEFT HEART CATH AND CORONARY ANGIOGRAPHY: CATH118266

## 2021-10-03 LAB — POCT I-STAT EG7
Acid-Base Excess: 4 mmol/L — ABNORMAL HIGH (ref 0.0–2.0)
Bicarbonate: 30.8 mmol/L — ABNORMAL HIGH (ref 20.0–28.0)
Calcium, Ion: 1.15 mmol/L (ref 1.15–1.40)
HCT: 37 % (ref 36.0–46.0)
Hemoglobin: 12.6 g/dL (ref 12.0–15.0)
O2 Saturation: 60 %
Potassium: 3.8 mmol/L (ref 3.5–5.1)
Sodium: 135 mmol/L (ref 135–145)
TCO2: 32 mmol/L (ref 22–32)
pCO2, Ven: 52.7 mmHg (ref 44.0–60.0)
pH, Ven: 7.375 (ref 7.250–7.430)
pO2, Ven: 32 mmHg (ref 32.0–45.0)

## 2021-10-03 LAB — POCT I-STAT 7, (LYTES, BLD GAS, ICA,H+H)
Acid-Base Excess: 0 mmol/L (ref 0.0–2.0)
Bicarbonate: 26.3 mmol/L (ref 20.0–28.0)
Calcium, Ion: 1.11 mmol/L — ABNORMAL LOW (ref 1.15–1.40)
HCT: 36 % (ref 36.0–46.0)
Hemoglobin: 12.2 g/dL (ref 12.0–15.0)
O2 Saturation: 97 %
Potassium: 3.5 mmol/L (ref 3.5–5.1)
Sodium: 125 mmol/L — ABNORMAL LOW (ref 135–145)
TCO2: 28 mmol/L (ref 22–32)
pCO2 arterial: 49.6 mmHg — ABNORMAL HIGH (ref 32.0–48.0)
pH, Arterial: 7.332 — ABNORMAL LOW (ref 7.350–7.450)
pO2, Arterial: 95 mmHg (ref 83.0–108.0)

## 2021-10-03 LAB — PROTIME-INR
INR: 1.8 — ABNORMAL HIGH (ref 0.8–1.2)
Prothrombin Time: 21.1 seconds — ABNORMAL HIGH (ref 11.4–15.2)

## 2021-10-03 LAB — HEPARIN LEVEL (UNFRACTIONATED): Heparin Unfractionated: 0.26 IU/mL — ABNORMAL LOW (ref 0.30–0.70)

## 2021-10-03 LAB — CBC
HCT: 35.1 % — ABNORMAL LOW (ref 36.0–46.0)
Hemoglobin: 11.3 g/dL — ABNORMAL LOW (ref 12.0–15.0)
MCH: 28.3 pg (ref 26.0–34.0)
MCHC: 32.2 g/dL (ref 30.0–36.0)
MCV: 87.8 fL (ref 80.0–100.0)
Platelets: 166 10*3/uL (ref 150–400)
RBC: 4 MIL/uL (ref 3.87–5.11)
RDW: 16 % — ABNORMAL HIGH (ref 11.5–15.5)
WBC: 5.2 10*3/uL (ref 4.0–10.5)
nRBC: 0 % (ref 0.0–0.2)

## 2021-10-03 LAB — MAGNESIUM: Magnesium: 1.8 mg/dL (ref 1.7–2.4)

## 2021-10-03 SURGERY — RIGHT/LEFT HEART CATH AND CORONARY ANGIOGRAPHY
Anesthesia: LOCAL

## 2021-10-03 MED ORDER — ENOXAPARIN SODIUM 60 MG/0.6ML IJ SOSY
1.0000 mg/kg | PREFILLED_SYRINGE | Freq: Two times a day (BID) | INTRAMUSCULAR | Status: DC
  Administered 2021-10-04 – 2021-10-06 (×4): 55 mg via SUBCUTANEOUS
  Filled 2021-10-03 (×5): qty 0.6

## 2021-10-03 MED ORDER — HEPARIN (PORCINE) IN NACL 1000-0.9 UT/500ML-% IV SOLN
INTRAVENOUS | Status: AC
  Filled 2021-10-03: qty 1000

## 2021-10-03 MED ORDER — HEPARIN (PORCINE) IN NACL 2000-0.9 UNIT/L-% IV SOLN
INTRAVENOUS | Status: DC | PRN
  Administered 2021-10-03: 1000 mL

## 2021-10-03 MED ORDER — VERAPAMIL HCL 2.5 MG/ML IV SOLN
INTRAVENOUS | Status: AC
  Filled 2021-10-03: qty 2

## 2021-10-03 MED ORDER — HEPARIN SODIUM (PORCINE) 1000 UNIT/ML IJ SOLN
INTRAMUSCULAR | Status: AC
  Filled 2021-10-03: qty 10

## 2021-10-03 MED ORDER — HYDRALAZINE HCL 20 MG/ML IJ SOLN: 10.0000 mg | INTRAMUSCULAR | Status: AC | PRN

## 2021-10-03 MED ORDER — SODIUM CHLORIDE 0.9% FLUSH: 3.0000 mL | INTRAVENOUS | Status: DC | PRN

## 2021-10-03 MED ORDER — FENTANYL CITRATE (PF) 100 MCG/2ML IJ SOLN
INTRAMUSCULAR | Status: DC | PRN
  Administered 2021-10-03 (×2): 25 ug via INTRAVENOUS

## 2021-10-03 MED ORDER — IOHEXOL 350 MG/ML SOLN
INTRAVENOUS | Status: DC | PRN
  Administered 2021-10-03: 40 mL

## 2021-10-03 MED ORDER — MIDAZOLAM HCL 2 MG/2ML IJ SOLN
INTRAMUSCULAR | Status: DC | PRN
  Administered 2021-10-03 (×2): 1 mg via INTRAVENOUS

## 2021-10-03 MED ORDER — SODIUM CHLORIDE 0.9% FLUSH
3.0000 mL | Freq: Two times a day (BID) | INTRAVENOUS | Status: DC
  Administered 2021-10-03 – 2021-10-05 (×5): 3 mL via INTRAVENOUS

## 2021-10-03 MED ORDER — ACETAMINOPHEN 325 MG PO TABS
650.0000 mg | ORAL_TABLET | ORAL | Status: DC | PRN
  Filled 2021-10-03: qty 2

## 2021-10-03 MED ORDER — SODIUM CHLORIDE 0.9 % IV SOLN: 250.0000 mL | INTRAVENOUS | Status: DC | PRN

## 2021-10-03 MED ORDER — LIDOCAINE HCL (PF) 1 % IJ SOLN
INTRAMUSCULAR | Status: DC | PRN
  Administered 2021-10-03: 5 mL
  Administered 2021-10-03: 20 mL

## 2021-10-03 MED ORDER — ENOXAPARIN SODIUM 60 MG/0.6ML IJ SOSY
60.0000 mg | PREFILLED_SYRINGE | Freq: Two times a day (BID) | INTRAMUSCULAR | 0 refills | Status: DC
  Filled 2021-10-03: qty 24, 20d supply, fill #0

## 2021-10-03 MED ORDER — LIDOCAINE HCL (PF) 1 % IJ SOLN
INTRAMUSCULAR | Status: AC
  Filled 2021-10-03: qty 30

## 2021-10-03 MED ORDER — FENTANYL CITRATE (PF) 100 MCG/2ML IJ SOLN
INTRAMUSCULAR | Status: AC
  Filled 2021-10-03: qty 2

## 2021-10-03 MED ORDER — MIDAZOLAM HCL 2 MG/2ML IJ SOLN
INTRAMUSCULAR | Status: AC
  Filled 2021-10-03: qty 2

## 2021-10-03 SURGICAL SUPPLY — 12 items
CATH INFINITI 5FR MULTPACK ANG (CATHETERS) ×2 IMPLANT
CATH SWAN GANZ 7F STRAIGHT (CATHETERS) ×2 IMPLANT
CLOSURE PERCLOSE PROSTYLE (VASCULAR PRODUCTS) ×4 IMPLANT
KIT HEART LEFT (KITS) ×3 IMPLANT
PACK CARDIAC CATHETERIZATION (CUSTOM PROCEDURE TRAY) ×3 IMPLANT
SHEATH PINNACLE 5F 10CM (SHEATH) ×2 IMPLANT
SHEATH PINNACLE 7F 10CM (SHEATH) ×2 IMPLANT
TRANSDUCER W/STOPCOCK (MISCELLANEOUS) ×3 IMPLANT
TUBING CIL FLEX 10 FLL-RA (TUBING) ×3 IMPLANT
WIRE EMERALD 3MM-J .025X260CM (WIRE) ×2 IMPLANT
WIRE EMERALD 3MM-J .035X150CM (WIRE) ×2 IMPLANT
WIRE MICRO SET SILHO 5FR 7 (SHEATH) ×2 IMPLANT

## 2021-10-03 NOTE — Progress Notes (Addendum)
°  HEART AND VASCULAR CENTER   MULTIDISCIPLINARY HEART VALVE TEAM   Patient underwent left and right heart catheterization today with stable CAD.  Dr. Excell Seltzer has ordered a Lovenox bridge for her.  Our initial plan was to stop her Coumadin on 12/22 and bring her in for direct admission on 12/24 for heparin bridge until TAVR on 12/27. Another option would be to continue on Lovenox alone until surgery and stop 12/26 PM. Will need to see if this is something the patient can manage or afford.   Cline Crock PA-C  [facility]

## 2021-10-03 NOTE — Progress Notes (Signed)
OT Cancellation Note  Patient Details Name: [name] [name] MRN: [id number] DOB: 1946/02/20   Cancelled Treatment:    Reason Eval/Treat Not Completed: Active bedrest order Patient still on bedrest after procedure. Will follow back as time permits.   Pollyann Glen E. Paschal Blanton, COTA/L Acute Rehabilitation Services [phone]   Cherlyn Cushing 10/03/2021, 1:16 PM

## 2021-10-03 NOTE — Progress Notes (Signed)
Physical Therapy Treatment Patient Details Name: [name] [name] MRN: [id number] DOB: 1946/03/06 Today's Date: 10/03/2021   History of Present Illness 75 y.o. female presenting 09/30/21 with SOB and urinary retention. Admitted with volume overload and acute on chronic combined CHF. Plan for [facility] 12/16. 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 However, pt agreeable to mobilizing, progressing to practicing navigating stairs. Pt with mild instability, but no LOB noted with stairs, min guard for safety using 1 handrail for support. Pt continues to benefit from supplemental O2 when mobilizing due to DOE 3/4 even though sats >/= 90%. Will continue to follow acutely. Current recommendations remain appropriate.    Recommendations for follow up therapy are one component of a multi-disciplinary discharge planning process, led by the attending physician.  Recommendations may be updated based on patient status, additional functional criteria and insurance authorization.  Follow Up Recommendations  No PT follow up     Assistance Recommended at Discharge PRN  Equipment Recommendations  None recommended by PT    Recommendations for Other Services       Precautions / Restrictions Precautions Precautions: Other (comment) Precaution Comments: Watch O2 Restrictions Weight Bearing Restrictions: No     Mobility  Bed Mobility               General bed mobility comments: Pt up in recliner upon arrival.    Transfers Overall transfer level: Modified independent Equipment used: None               General transfer comment: Able to come to stand without assistance, extra time.     Ambulation/Gait Ambulation/Gait assistance: Supervision Gait Distance (Feet): 120 Feet Assistive device: None Gait Pattern/deviations: Step-through pattern;Decreased stride length;Trunk flexed Gait velocity: reduced Gait velocity interpretation: 1.31 - 2.62 ft/sec, indicative of limited community ambulator   General Gait Details: Pt with slow, but steady gait, no LOB, supervision for safety.   Stairs Stairs: Yes Stairs assistance: Min guard Stair Management: One rail Right;One rail Left;Step to pattern;Forwards Number of Stairs: 2 General stair comments: Ascends with R hand rail and descends with L hand rail, step-to pattern, slowly with no LOB, min guard for safety.   Wheelchair Mobility    Modified Rankin (Stroke Patients Only)       Balance Overall balance assessment: Mild deficits observed, not formally tested                                          Cognition Arousal/Alertness: Awake/alert Behavior During Therapy: WFL for tasks assessed/performed Overall Cognitive Status: Within Functional Limits for tasks assessed                                          Exercises      General Comments General comments (skin integrity, edema, etc.): Pt needing 2L O2 for mobility due to DOE 3/4, SpO2 >/= 90% though      Pertinent Vitals/Pain Pain Assessment: Faces Faces Pain Scale: Hurts little more Pain Location: back Pain Descriptors /  Indicators: Discomfort;Guarding Pain Intervention(s): Limited activity within patient's tolerance;Monitored during session;Repositioned    Home Living                          Prior Function            PT Goals (current goals can now be found in the care plan section) Acute Rehab PT Goals Patient Stated Goal: to get stronger PT Goal Formulation: With patient Time For Goal Achievement: 10/15/21 Potential to Achieve Goals: Good Progress towards PT goals: Progressing toward goals     Frequency    Min 3X/week      PT Plan Current plan remains appropriate    Co-evaluation              AM-PAC PT "6 Clicks" Mobility   Outcome Measure  Help needed turning from your back to your side while in a flat bed without using bedrails?: None Help needed moving from lying on your back to sitting on the side of a flat bed without using bedrails?: None Help needed moving to and from a bed to a chair (including a wheelchair)?: None Help needed standing up from a chair using your arms (e.g., wheelchair or bedside chair)?: None Help needed to walk in hospital room?: A Little Help needed climbing 3-5 steps with a railing? : A Little 6 Click Score: 22    End of Session Equipment Utilized During Treatment: Oxygen Activity Tolerance: Patient tolerated treatment well;Patient limited by fatigue Patient left: with call bell/phone within reach;in chair Nurse Communication: Mobility status;Other (comment) (request for anxiety meds by pt) PT Visit Diagnosis: Other abnormalities of gait and mobility (R26.89);Muscle weakness (generalized) (M62.81);Difficulty in walking, not elsewhere classified (R26.2);Pain;Unsteadiness on feet (R26.81) Pain - part of body:  (back)     Time: 5397-6734 PT Time Calculation (min) (ACUTE ONLY): 12 min  Charges:  $Gait Training: 8-22 mins                     Raymond Gurney, PT, DPT Acute Rehabilitation Services  Pager: [phone] Office: [phone]    [name] [name] 10/03/2021, 9:27 AM

## 2021-10-03 NOTE — Progress Notes (Signed)
ANTICOAGULATION CONSULT NOTE - Follow Up Consult  Pharmacy Consult for IV Heparin (Warfarin on Hold) Indication:  Mechanical Valve  Allergies  Allergen Reactions   Atenolol Other (See Comments)    HR and pulse dropped   Metoprolol Succinate [Metoprolol] Other (See Comments)    HR and pulse dropped   Baclofen Anxiety and Other (See Comments)    Makes the patient feel anxious the day after taking this   Latex Rash    Patient Measurements: Vital Signs: Temp: 98.6 F (37 C) (12/15 1955) Temp Source: Oral (12/15 1955) BP: 123/52 (12/15 1955) Pulse Rate: 66 (12/15 1955)  Labs: Recent Labs    09/30/21 1103 09/30/21 1302 09/30/21 1557 10/01/21 0400 10/02/21 0241 10/02/21 1757 10/03/21 0230  HGB 12.1  --   --  10.7*  --   --  11.3*  HCT 38.3  --   --  33.5*  --   --  35.1*  PLT 189  --   --  147*  --   --  166  LABPROT  --   --    < > 28.8* 22.7*  --  21.1*  INR  --   --    < > 2.7* 2.0*  --  1.8*  HEPARINUNFRC  --   --   --   --   --  0.40 0.26*  CREATININE 1.27*  --   --  1.14* 1.14*  --   --   TROPONINIHS 28* 23*  --   --   --   --   --    < > = values in this interval not displayed.     Estimated Creatinine Clearance: 31.9 mL/min (A) (by C-G formula based on SCr of 1.14 mg/dL (H)).   Medical History: Past Medical History:  Diagnosis Date   Anxiety    Aortic aneurysm [facility])    Atrial arrhythmia    With prior history of atrial flutter ablation   Bradycardia    s/p pacemaker implantation   CONGESTIVE HEART FAILURE 07/31/2010   Qualifier: Diagnosis of  By: Caryl Comes, MD, Remus Blake    GERD (gastroesophageal reflux disease)    Hypertension    Hypothyroidism    Long Q-T syndrome    Myocardial infarct ([place]) 11/20/2011   Stent placement for 100% blockage of LAD   PACEMAKER, 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   Qualifier: Diagnosis of  By: Caryl Comes, MD, Remus Blake    Rheumatic heart disease    With mitral valve replacement, tricuspid regurgitation moderate to  severe, significant left atrial enlargment   TRICUSPID REGURGITATION 12/23/2010   Qualifier: Diagnosis of  By: Caryl Comes, MD, Remus Blake    Unspecified essential hypertension 07/31/2010   Qualifier: Diagnosis of  By: Caryl Comes, MD, Remus Blake     Assessment: 75 yr old woman with hx of chronic systolic CHF, severe AS undergoing evaluation for TAVR; 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 Patient presented with urinary retention, SOB, and weakness. Pharmacy was consulted for heparin dosing for mechanical mitral valve.  PTA warfarin regimen:  7.5 mg daily (last dose 12/11; per patient, INR was > 5 at that time). INR was 3.1 on admission.  INR is down to 2 this morning.  Pharmacy asked to begin IV heparin while patient awaiting cath.  -heparin level below goal on 750 units/hr   Goal of Therapy:  Heparin level 0.3-0.7 units/ml (INR 2.5-3.5 for warfarin) Monitor platelets by anticoagulation  protocol: Yes  Plan:  -Increase heparin to 850 units/hr -Will follow plans post cath  Harland German, PharmD Clinical Pharmacist **Pharmacist phone directory can now be found on [email or web address] ([email or web address]).  Listed under [facility].

## 2021-10-03 NOTE — Progress Notes (Signed)
SLP Cancellation Note  Patient Details Name: [name] [name] MRN: [id number] DOB: [birth year]   Cancelled treatment:       Reason Eval/Treat Not Completed: Medical issues which prohibited therapy - pt NPO this am for procedure. Will f/u for swallow eval as able.     Mahala Menghini., M.A. CCC-SLP Acute Rehabilitation Services Pager [phone] Office [phone]  10/03/2021, 8:03 AM

## 2021-10-03 NOTE — Progress Notes (Signed)
°  HEART AND VASCULAR CENTER   MULTIDISCIPLINARY HEART VALVE TEAM   Update on plan: Patient has opted to be directly admitted to [facility] on 10/11/21 for TAVR on 12/27. Plan will be to discharge with SQ Lovenox and either continue this after re-admission or transition to Heparin. She has used Lovenox in the past and is familiar with self-injection. We have reviewed the potential scenarios and direct admission 12/24 seems less problematic given her prior issues with transportation. Our team will be in contact with the patient for any other questions.   Georgie Chard NP-C Structural Heart Team  Pager: [phone] Phone: [phone]

## 2021-10-03 NOTE — Progress Notes (Signed)
Mobility Specialist Progress Note:   10/03/21 1554  Mobility  Activity Ambulated to bathroom  Level of Assistance Standby assist, set-up cues, supervision of patient - no hands on  Assistive Device None  Distance Ambulated (ft) 40 ft  Mobility Ambulated with assistance in room  Mobility Response Tolerated well  Mobility performed by Mobility specialist  Bed Position Chair  $Mobility charge 1 Mobility   Pt received in bed requesting to go to the bathroom. No complaints of pain. Pt returned to chair with call bell in reach and all needs met.   [facility] Public librarian Phone [phone] Secondary Phone [phone]

## 2021-10-03 NOTE — Interval H&P Note (Signed)
History and Physical Interval Note:  10/03/2021 10:31 AM  [name] [name]  has presented today for surgery, with the diagnosis of aortic stenosis - pre tavr.  The various methods of treatment have been discussed with the patient and family. After consideration of risks, benefits and other options for treatment, the patient has consented to  Procedure(s): RIGHT/LEFT HEART CATH AND CORONARY ANGIOGRAPHY (N/A) as a surgical intervention.  The patient's history has been reviewed, patient examined, no change in status, stable for surgery.  I have reviewed the patient's chart and labs.  Questions were answered to the patient's satisfaction.     Tonny Bollman

## 2021-10-03 NOTE — Progress Notes (Signed)
Primary cardiolgist :  Excell Seltzer  Subjective:  Denies SSCP, palpitations or Dyspnea Still with constipation and bladder issues   Objective:  Vitals:   10/02/21 1700 10/02/21 1840 10/02/21 1955 10/03/21 0019  BP:  (!) 138/40 (!) 123/52   Pulse:   66   Resp:   (!) 25 20  Temp:   98.6 F (37 C)   TempSrc:   Oral   SpO2: 93%  95%   Weight:    55.1 kg  Height:        Intake/Output from previous day:  Intake/Output Summary (Last 24 hours) at 10/03/2021 0845 Last data filed at 10/03/2021 0136 Gross per 24 hour  Intake 595.38 ml  Output 1300 ml  Net -704.62 ml    Physical Exam:  Short white female JVP elevated PPM under left clavicle Post sternotomy S1 click severe AS murmur decreased S2 Plus one edema Abdomen benign   Lab Results: Basic Metabolic Panel: Recent Labs    10/01/21 0400 10/02/21 0241 10/03/21 0230  NA 132* 135  --   K 2.8* 3.5  --   CL 97* 100  --   CO2 26 24  --   GLUCOSE 90 111*  --   BUN 15 14  --   CREATININE 1.14* 1.14*  --   CALCIUM 8.5* 8.5*  --   MG  --   --  1.8   Liver Function Tests: Recent Labs    09/30/21 1302  AST 26  ALT 17  ALKPHOS 84  BILITOT 1.3*  PROT 7.0  ALBUMIN 4.1   No results for input(s): LIPASE, AMYLASE in the last 72 hours. CBC: Recent Labs    09/30/21 1103 10/01/21 0400 10/03/21 0230  WBC 6.3 4.7 5.2  NEUTROABS 5.0  --   --   HGB 12.1 10.7* 11.3*  HCT 38.3 33.5* 35.1*  MCV 89.5 89.1 87.8  PLT 189 147* 166    Anemia Panel: No results for input(s): VITAMINB12, FOLATE, FERRITIN, TIBC, IRON, RETICCTPCT in the last 72 hours.  Imaging: CT CORONARY MORPH W/CTA COR W/SCORE W/CA W/CM &/OR WO/CM  Addendum Date: 10/02/2021   ADDENDUM REPORT: 10/02/2021 09:41 CLINICAL DATA:  Aortic stenosis EXAM: Cardiac TAVR CT TECHNIQUE: The patient was scanned on a Siemens Force 192 slice scanner. A 120 kV retrospective scan was triggered in the descending thoracic aorta at 111 HU's. Gantry rotation speed was 270 msecs  and collimation was .9 mm. No beta blockade or nitro were given. The 3D data set was reconstructed in 5% intervals of the R-R cycle. Systolic and diastolic phases were analyzed on a dedicated work station using MPR, MIP and VRT modes. The patient received 80 cc of contrast. FINDINGS: Aortic Valve: Tri leaflet calcified with restricted leaflet motion Calcium score > 5000 Aorta: Heavily calcified mildly dilated ascending thoracic aorta severe calcific atherosclerosis Sinotubular Junction: 27 mm Ascending Thoracic Aorta: 39 mm Descending Thoracic Aorta: 24 mm Sinus of Valsalva Measurements: Non-coronary: 34.8 mm Right - coronary: 33.6 mm Left - coronary: 32.4 mm Coronary Artery Height above Annulus: Left Main: 11.7 mm above annulus Right Coronary: 11.1 mm above annulus Virtual Basal Annulus Measurements: Maximum/Minimum Diameter: 27 mm x 19.4 mm Perimeter: 77.6 mm Area: 441 mm2 30% phase Coronary Arteries: Sufficient height above annulus for deployment Optimum Fluoroscopic Angle for Delivery: LAO 12 Caudal 12 degrees IMPRESSION: 1. Calcified tri-leaflet AV with score >5,000 2.  Annular area of 441 mm2 suitable for a 26 mm Sapien 3 valve 3. Normal appearing bileaflet  mechanical MVR with no pannus and good opening /closing angles 4.  Mixing artifact in LAA no delayed images done 5.  Coronary arteries sufficient height above annulus for deployment 6. Optimum angiographic angle for deployment LAO 12 Caudal 12 degrees 7.  Patient appearing stent in LAD 8.  Pacing wires in RA/RV 9.  Dilated main PA 4.1 cm suggesting elevated PA systolic pressures [place] Rouge Electronically Signed   By: Jenkins Rouge M.D.   On: 10/02/2021 09:41   Result Date: 10/02/2021 EXAM: OVER-READ INTERPRETATION  CT CHEST The following report is an over-read performed by radiologist Dr. Vinnie Langton of [facility], [place] on 10/02/2021. This over-read does not include interpretation of cardiac or coronary anatomy or pathology. The coronary  calcium score/coronary CTA interpretation by the cardiologist is attached. COMPARISON:  Chest CT 07/22/2021. FINDINGS: Extracardiac findings will be described separately under dictation for contemporaneously obtained CTA chest, abdomen and pelvis. IMPRESSION: Please see separate dictation for contemporaneously obtained CTA chest, abdomen and pelvis dated 10/02/2021 for full description of relevant extracardiac findings. Electronically Signed: By: Vinnie Langton M.D. On: 10/02/2021 09:14   CT ANGIO CHEST AORTA W/CM & OR WO/CM  Result Date: 10/02/2021 CLINICAL DATA:  75 year old female with history of severe aortic stenosis. Preprocedural study prior to potential transcatheter aortic valve replacement (TAVR) procedure. EXAM: CT ANGIOGRAPHY CHEST, ABDOMEN AND PELVIS TECHNIQUE: Multidetector CT imaging through the chest, abdomen and pelvis was performed using the standard protocol during bolus administration of intravenous contrast. Multiplanar reconstructed images and MIPs were obtained and reviewed to evaluate the vascular anatomy. CONTRAST:  182mL OMNIPAQUE IOHEXOL 350 MG/ML SOLN COMPARISON:  None. FINDINGS: CTA CHEST FINDINGS Cardiovascular: Heart size is severely enlarged. There is no significant pericardial fluid, thickening or pericardial calcification. There is aortic atherosclerosis, as well as atherosclerosis of the great vessels of the mediastinum and the coronary arteries, including calcified atherosclerotic plaque in the left main, left anterior descending, left circumflex and right coronary arteries. Myocardial thinning and subendocardial hypoattenuation throughout the mid to distal LAD territory, likely sequela of prior LAD territory myocardial infarction(s). This is associated with some mild aneurysmal dilatation of the left ventricular apex. Severe dilatation of the pulmonic trunk (4.3 cm in diameter), concerning for pulmonary arterial hypertension. Severe thickening calcification of the aortic  valve. 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 Calcifications of the mitral annulus. Left-sided pacemaker device in place with lead tips terminating in the right atrial appendage and right ventricular apex. Mediastinum/Lymph Nodes: No pathologically enlarged mediastinal or hilar lymph nodes. Hilar esophagus is unremarkable in appearance. No axillary lymphadenopathy. Lungs/Pleura: Small bilateral pleural effusions (right greater than left) lying dependently. Scattered areas of subsegmental atelectasis are noted in the lower lungs, along with near complete atelectasis of the right middle lobe. There is a background of mild diffuse ground-glass attenuation and interlobular septal thickening throughout the lungs bilaterally, suggesting the presence of mild interstitial pulmonary edema. Musculoskeletal/Soft Tissues: Median sternotomy wires. There are no aggressive appearing lytic or blastic lesions noted in the visualized portions of the skeleton. CTA ABDOMEN AND PELVIS FINDINGS Hepatobiliary: Heterogeneous attenuation throughout the hepatic parenchyma, with large areas of low attenuation most evident in the right lobe of the liver, favored to reflect a background of heterogeneous hepatic steatosis. No definite focal cystic or solid hepatic lesions. No intra or extrahepatic biliary ductal dilatation. Status post cholecystectomy. Pancreas: No pancreatic mass. No pancreatic ductal dilatation. No pancreatic or peripancreatic fluid collections or inflammatory changes. Spleen: Small splenule inferior to the spleen.  Otherwise, unremarkable. Adrenals/Urinary Tract: Mild multifocal cortical thinning in both kidneys. Subcentimeter low-attenuation lesion in the upper pole of the left kidney, too small to characterize, but statistically likely to represent a tiny cyst. Bilateral adrenal glands are normal in appearance. No hydroureteronephrosis. Urinary bladder is normal in appearance. Stomach/Bowel: The  appearance of the stomach is normal. There is no pathologic dilatation of small bowel or colon. The appendix is not confidently identified and may be surgically absent. Regardless, there are no inflammatory changes noted adjacent to the cecum to suggest the presence of an acute appendicitis at this time. Vascular/Lymphatic: Aortic atherosclerosis, with vascular findings and measurements pertinent to potential TAVR procedure, as detailed below. No aneurysm or dissection noted in the abdominal or pelvic vasculature. No lymphadenopathy identified in the abdomen or pelvis. Reproductive: Uterus and ovaries are atrophic. Other: No significant volume of ascites.  No pneumoperitoneum. Musculoskeletal: There are no aggressive appearing lytic or blastic lesions noted in the visualized portions of the skeleton. VASCULAR MEASUREMENTS PERTINENT TO TAVR: AORTA: Minimal Aortic Diameter-13 x 14 mm Severity of Aortic Calcification-moderate to severe RIGHT PELVIS: Right Common Iliac Artery - Minimal Diameter-7.7 x 7.1 mm Tortuosity-mild Calcification-moderate Right External Iliac Artery - Minimal Diameter-7.1 x 7.1 mm Tortuosity-mild-to-moderate Calcification-none Right Common Femoral Artery - Minimal Diameter-6.5 x 6.3 mm Tortuosity-mild Calcification-none LEFT PELVIS: Left Common Iliac Artery - Minimal Diameter-8.2 x 8.7 mm Tortuosity-mild Calcification-moderate Left External Iliac Artery - Minimal Diameter-6.3 x 7.2 mm Tortuosity-mild Calcification-none Left Common Femoral Artery - Minimal Diameter-6.8 x 6.8 mm Tortuosity-mild Calcification-none Review of the MIP images confirms the above findings. IMPRESSION: 1. Vascular findings and measurements pertinent to potential TAVR procedure, as detailed above. 2. Severe thickening calcification of the aortic valve, compatible with reported clinical history of severe aortic stenosis. 3. Cardiomegaly with findings suggestive of congestive heart failure, including evidence of mild  interstitial pulmonary edema and small bilateral pleural effusions, as detailed above. 4. Aortic atherosclerosis, in addition to left main and 3 vessel coronary artery disease. There is also evidence of prior LAD territory myocardial infarction(s), as above. 5. Severe dilatation of the pulmonic trunk (4.3 cm in diameter), concerning for pulmonary arterial hypertension. 6. Heterogeneous hepatic steatosis. 7. Additional incidental findings, as above. Electronically Signed   By: Vinnie Langton M.D.   On: 10/02/2021 10:12   CT ANGIO ABDOMEN PELVIS  W &/OR WO CONTRAST  Result Date: 10/02/2021 CLINICAL DATA:  75 year old female with history of severe aortic stenosis. Preprocedural study prior to potential transcatheter aortic valve replacement (TAVR) procedure. EXAM: CT ANGIOGRAPHY CHEST, ABDOMEN AND PELVIS TECHNIQUE: Multidetector CT imaging through the chest, abdomen and pelvis was performed using the standard protocol during bolus administration of intravenous contrast. Multiplanar reconstructed images and MIPs were obtained and reviewed to evaluate the vascular anatomy. CONTRAST:  150mL OMNIPAQUE IOHEXOL 350 MG/ML SOLN COMPARISON:  None. FINDINGS: CTA CHEST FINDINGS Cardiovascular: Heart size is severely enlarged. There is no significant pericardial fluid, thickening or pericardial calcification. There is aortic atherosclerosis, as well as atherosclerosis of the great vessels of the mediastinum and the coronary arteries, including calcified atherosclerotic plaque in the left main, left anterior descending, left circumflex and right coronary arteries. Myocardial thinning and subendocardial hypoattenuation throughout the mid to distal LAD territory, likely sequela of prior LAD territory myocardial infarction(s). This is associated with some mild aneurysmal dilatation of the left ventricular apex. Severe dilatation of the pulmonic trunk (4.3 cm in diameter), concerning for pulmonary arterial hypertension. Severe  thickening calcification of the aortic valve. Status post mitral  valve replacement with mechanical mitral valve. Calcifications of the mitral annulus. Left-sided pacemaker device in place with lead tips terminating in the right atrial appendage and right ventricular apex. Mediastinum/Lymph Nodes: No pathologically enlarged mediastinal or hilar lymph nodes. Hilar esophagus is unremarkable in appearance. No axillary lymphadenopathy. Lungs/Pleura: Small bilateral pleural effusions (right greater than left) lying dependently. Scattered areas of subsegmental atelectasis are noted in the lower lungs, along with near complete atelectasis of the right middle lobe. There is a background of mild diffuse ground-glass attenuation and interlobular septal thickening throughout the lungs bilaterally, suggesting the presence of mild interstitial pulmonary edema. Musculoskeletal/Soft Tissues: Median sternotomy wires. There are no aggressive appearing lytic or blastic lesions noted in the visualized portions of the skeleton. CTA ABDOMEN AND PELVIS FINDINGS Hepatobiliary: Heterogeneous attenuation throughout the hepatic parenchyma, with large areas of low attenuation most evident in the right lobe of the liver, favored to reflect a background of heterogeneous hepatic steatosis. No definite focal cystic or solid hepatic lesions. No intra or extrahepatic biliary ductal dilatation. Status post cholecystectomy. Pancreas: No pancreatic mass. No pancreatic ductal dilatation. No pancreatic or peripancreatic fluid collections or inflammatory changes. Spleen: Small splenule inferior to the spleen. Otherwise, unremarkable. Adrenals/Urinary Tract: Mild multifocal cortical thinning in both kidneys. Subcentimeter low-attenuation lesion in the upper pole of the left kidney, too small to characterize, but statistically likely to represent a tiny cyst. Bilateral adrenal glands are normal in appearance. No hydroureteronephrosis. Urinary bladder is  normal in appearance. Stomach/Bowel: The appearance of the stomach is normal. There is no pathologic dilatation of small bowel or colon. The appendix is not confidently identified and may be surgically absent. Regardless, there are no inflammatory changes noted adjacent to the cecum to suggest the presence of an acute appendicitis at this time. Vascular/Lymphatic: Aortic atherosclerosis, with vascular findings and measurements pertinent to potential TAVR procedure, as detailed below. No aneurysm or dissection noted in the abdominal or pelvic vasculature. No lymphadenopathy identified in the abdomen or pelvis. Reproductive: Uterus and ovaries are atrophic. Other: No significant volume of ascites.  No pneumoperitoneum. Musculoskeletal: There are no aggressive appearing lytic or blastic lesions noted in the visualized portions of the skeleton. VASCULAR MEASUREMENTS PERTINENT TO TAVR: AORTA: Minimal Aortic Diameter-13 x 14 mm Severity of Aortic Calcification-moderate to severe RIGHT PELVIS: Right Common Iliac Artery - Minimal Diameter-7.7 x 7.1 mm Tortuosity-mild Calcification-moderate Right External Iliac Artery - Minimal Diameter-7.1 x 7.1 mm Tortuosity-mild-to-moderate Calcification-none Right Common Femoral Artery - Minimal Diameter-6.5 x 6.3 mm Tortuosity-mild Calcification-none LEFT PELVIS: Left Common Iliac Artery - Minimal Diameter-8.2 x 8.7 mm Tortuosity-mild Calcification-moderate Left External Iliac Artery - Minimal Diameter-6.3 x 7.2 mm Tortuosity-mild Calcification-none Left Common Femoral Artery - Minimal Diameter-6.8 x 6.8 mm Tortuosity-mild Calcification-none Review of the MIP images confirms the above findings. IMPRESSION: 1. Vascular findings and measurements pertinent to potential TAVR procedure, as detailed above. 2. Severe thickening calcification of the aortic valve, compatible with reported clinical history of severe aortic stenosis. 3. Cardiomegaly with findings suggestive of congestive heart  failure, including evidence of mild interstitial pulmonary edema and small bilateral pleural effusions, as detailed above. 4. Aortic atherosclerosis, in addition to left main and 3 vessel coronary artery disease. There is also evidence of prior LAD territory myocardial infarction(s), as above. 5. Severe dilatation of the pulmonic trunk (4.3 cm in diameter), concerning for pulmonary arterial hypertension. 6. Heterogeneous hepatic steatosis. 7. Additional incidental findings, as above. Electronically Signed   By: Vinnie Langton M.D.   On: 10/02/2021 10:12  Cardiac Studies:  ECG: AV pacing LBBB   Telemetry: AV pacing   Echo: EF 30-35% severe AS mean gradient 53 mmHg  Medications:    atorvastatin  80 mg Oral QHS   docusate sodium  100 mg Oral BID   feeding supplement  237 mL Oral TID BM   furosemide  40 mg Intravenous BID   levothyroxine  75 mcg Oral QAC breakfast   mouth rinse  15 mL Mouth Rinse BID   multivitamin with minerals  1 tablet Oral Daily   pantoprazole  40 mg Oral Daily   sodium chloride flush  3 mL Intravenous Q12H   sodium chloride flush  3 mL Intravenous Q12H   zolpidem  5 mg Oral QHS      sodium chloride     sodium chloride 10 mL/hr at 10/03/21 0603   heparin 850 Units/hr (10/03/21 0320)    Assessment/Plan:   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 Feeling better with diuresis . Continue iv lasix change to PO post cath in am  AS:  read her TAVR CT this am suitable for a 26 mm Sapient 3 valve structural team following MVR:  normal function on CT no pannus/thrombus low mean gradient on CT transitioned to heparin INR 1.8 this am Resume coumadin post cath with bridge  CAD:  distant stent to LAD It is patent on CT but needs right and left cath today to assess cors for TAVR. Note previous cath unsuccessful from right radial would use RFA or left wrist  Discusse case with Dr Clarnce Flock [facility] 10/03/2021, 8:45 AM

## 2021-10-03 NOTE — H&P (View-Only) (Signed)
Primary cardiolgist :  Excell Seltzer  Subjective:  Denies SSCP, palpitations or Dyspnea Still with constipation and bladder issues   Objective:  Vitals:   10/02/21 1700 10/02/21 1840 10/02/21 1955 10/03/21 0019  BP:  (!) 138/40 (!) 123/52   Pulse:   66   Resp:   (!) 25 20  Temp:   98.6 F (37 C)   TempSrc:   Oral   SpO2: 93%  95%   Weight:    55.1 kg  Height:        Intake/Output from previous day:  Intake/Output Summary (Last 24 hours) at 10/03/2021 0845 Last data filed at 10/03/2021 0136 Gross per 24 hour  Intake 595.38 ml  Output 1300 ml  Net -704.62 ml    Physical Exam:  Short white female JVP elevated PPM under left clavicle Post sternotomy S1 click severe AS murmur decreased S2 Plus one edema Abdomen benign   Lab Results: Basic Metabolic Panel: Recent Labs    10/01/21 0400 10/02/21 0241 10/03/21 0230  NA 132* 135  --   K 2.8* 3.5  --   CL 97* 100  --   CO2 26 24  --   GLUCOSE 90 111*  --   BUN 15 14  --   CREATININE 1.14* 1.14*  --   CALCIUM 8.5* 8.5*  --   MG  --   --  1.8   Liver Function Tests: Recent Labs    09/30/21 1302  AST 26  ALT 17  ALKPHOS 84  BILITOT 1.3*  PROT 7.0  ALBUMIN 4.1   No results for input(s): LIPASE, AMYLASE in the last 72 hours. CBC: Recent Labs    09/30/21 1103 10/01/21 0400 10/03/21 0230  WBC 6.3 4.7 5.2  NEUTROABS 5.0  --   --   HGB 12.1 10.7* 11.3*  HCT 38.3 33.5* 35.1*  MCV 89.5 89.1 87.8  PLT 189 147* 166    Anemia Panel: No results for input(s): VITAMINB12, FOLATE, FERRITIN, TIBC, IRON, RETICCTPCT in the last 72 hours.  Imaging: CT CORONARY MORPH W/CTA COR W/SCORE W/CA W/CM &/OR WO/CM  Addendum Date: 10/02/2021   ADDENDUM REPORT: 10/02/2021 09:41 CLINICAL DATA:  Aortic stenosis EXAM: Cardiac TAVR CT TECHNIQUE: The patient was scanned on a Siemens Force 192 slice scanner. A 120 kV retrospective scan was triggered in the descending thoracic aorta at 111 HU's. Gantry rotation speed was 270 msecs  and collimation was .9 mm. No beta blockade or nitro were given. The 3D data set was reconstructed in 5% intervals of the R-R cycle. Systolic and diastolic phases were analyzed on a dedicated work station using MPR, MIP and VRT modes. The patient received 80 cc of contrast. FINDINGS: Aortic Valve: Tri leaflet calcified with restricted leaflet motion Calcium score > 5000 Aorta: Heavily calcified mildly dilated ascending thoracic aorta severe calcific atherosclerosis Sinotubular Junction: 27 mm Ascending Thoracic Aorta: 39 mm Descending Thoracic Aorta: 24 mm Sinus of Valsalva Measurements: Non-coronary: 34.8 mm Right - coronary: 33.6 mm Left - coronary: 32.4 mm Coronary Artery Height above Annulus: Left Main: 11.7 mm above annulus Right Coronary: 11.1 mm above annulus Virtual Basal Annulus Measurements: Maximum/Minimum Diameter: 27 mm x 19.4 mm Perimeter: 77.6 mm Area: 441 mm2 30% phase Coronary Arteries: Sufficient height above annulus for deployment Optimum Fluoroscopic Angle for Delivery: LAO 12 Caudal 12 degrees IMPRESSION: 1. Calcified tri-leaflet AV with score >5,000 2.  Annular area of 441 mm2 suitable for a 26 mm Sapien 3 valve 3. Normal appearing bileaflet  mechanical MVR with no pannus and good opening /closing angles 4.  Mixing artifact in LAA no delayed images done 5.  Coronary arteries sufficient height above annulus for deployment 6. Optimum angiographic angle for deployment LAO 12 Caudal 12 degrees 7.  Patient appearing stent in LAD 8.  Pacing wires in RA/RV 9.  Dilated main PA 4.1 cm suggesting elevated PA systolic pressures [name] [name] Electronically Signed   By: Oneill Bais M.D.   On: 10/02/2021 09:41  ° °Result Date: 10/02/2021 °EXAM: OVER-READ INTERPRETATION  CT CHEST The following report is an over-read performed by radiologist Dr. Daniel Entrikin of [facility] on 10/02/2021. This over-read does not include interpretation of cardiac or coronary anatomy or pathology. The coronary  calcium score/coronary CTA interpretation by the cardiologist is attached. COMPARISON:  Chest CT 07/22/2021. FINDINGS: Extracardiac findings will be described separately under dictation for contemporaneously obtained CTA chest, abdomen and pelvis. IMPRESSION: Please see separate dictation for contemporaneously obtained CTA chest, abdomen and pelvis dated 10/02/2021 for full description of relevant extracardiac findings. Electronically Signed: By: Daniel  Entrikin M.D. On: 10/02/2021 09:14  ° °CT ANGIO CHEST AORTA W/CM & OR WO/CM ° °Result Date: 10/02/2021 °CLINICAL DATA:  75-year-old female with history of severe aortic stenosis. Preprocedural study prior to potential transcatheter aortic valve replacement (TAVR) procedure. EXAM: CT ANGIOGRAPHY CHEST, ABDOMEN AND PELVIS TECHNIQUE: Multidetector CT imaging through the chest, abdomen and pelvis was performed using the standard protocol during bolus administration of intravenous contrast. Multiplanar reconstructed images and MIPs were obtained and reviewed to evaluate the vascular anatomy. CONTRAST:  100mL OMNIPAQUE IOHEXOL 350 MG/ML SOLN COMPARISON:  None. FINDINGS: CTA CHEST FINDINGS Cardiovascular: Heart size is severely enlarged. There is no significant pericardial fluid, thickening or pericardial calcification. There is aortic atherosclerosis, as well as atherosclerosis of the great vessels of the mediastinum and the coronary arteries, including calcified atherosclerotic plaque in the left main, left anterior descending, left circumflex and right coronary arteries. Myocardial thinning and subendocardial hypoattenuation throughout the mid to distal LAD territory, likely sequela of prior LAD territory myocardial infarction(s). This is associated with some mild aneurysmal dilatation of the left ventricular apex. Severe dilatation of the pulmonic trunk (4.3 cm in diameter), concerning for pulmonary arterial hypertension. Severe thickening calcification of the aortic  valve. 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 Calcifications of the mitral annulus. Left-sided pacemaker device in place with lead tips terminating in the right atrial appendage and right ventricular apex. Mediastinum/Lymph Nodes: No pathologically enlarged mediastinal or hilar lymph nodes. Hilar esophagus is unremarkable in appearance. No axillary lymphadenopathy. Lungs/Pleura: Small bilateral pleural effusions (right greater than left) lying dependently. Scattered areas of subsegmental atelectasis are noted in the lower lungs, along with near complete atelectasis of the right middle lobe. There is a background of mild diffuse ground-glass attenuation and interlobular septal thickening throughout the lungs bilaterally, suggesting the presence of mild interstitial pulmonary edema. Musculoskeletal/Soft Tissues: Median sternotomy wires. There are no aggressive appearing lytic or blastic lesions noted in the visualized portions of the skeleton. CTA ABDOMEN AND PELVIS FINDINGS Hepatobiliary: Heterogeneous attenuation throughout the hepatic parenchyma, with large areas of low attenuation most evident in the right lobe of the liver, favored to reflect a background of heterogeneous hepatic steatosis. No definite focal cystic or solid hepatic lesions. No intra or extrahepatic biliary ductal dilatation. Status post cholecystectomy. Pancreas: No pancreatic mass. No pancreatic ductal dilatation. No pancreatic or peripancreatic fluid collections or inflammatory changes. Spleen: Small splenule inferior to the spleen.   Otherwise, unremarkable. Adrenals/Urinary Tract: Mild multifocal cortical thinning in both kidneys. Subcentimeter low-attenuation lesion in the upper pole of the left kidney, too small to characterize, but statistically likely to represent a tiny cyst. Bilateral adrenal glands are normal in appearance. No hydroureteronephrosis. Urinary bladder is normal in appearance. Stomach/Bowel: The  appearance of the stomach is normal. There is no pathologic dilatation of small bowel or colon. The appendix is not confidently identified and may be surgically absent. Regardless, there are no inflammatory changes noted adjacent to the cecum to suggest the presence of an acute appendicitis at this time. Vascular/Lymphatic: Aortic atherosclerosis, with vascular findings and measurements pertinent to potential TAVR procedure, as detailed below. No aneurysm or dissection noted in the abdominal or pelvic vasculature. No lymphadenopathy identified in the abdomen or pelvis. Reproductive: Uterus and ovaries are atrophic. Other: No significant volume of ascites.  No pneumoperitoneum. Musculoskeletal: There are no aggressive appearing lytic or blastic lesions noted in the visualized portions of the skeleton. VASCULAR MEASUREMENTS PERTINENT TO TAVR: AORTA: Minimal Aortic Diameter-13 x 14 mm Severity of Aortic Calcification-moderate to severe RIGHT PELVIS: Right Common Iliac Artery - Minimal Diameter-7.7 x 7.1 mm Tortuosity-mild Calcification-moderate Right External Iliac Artery - Minimal Diameter-7.1 x 7.1 mm Tortuosity-mild-to-moderate Calcification-none Right Common Femoral Artery - Minimal Diameter-6.5 x 6.3 mm Tortuosity-mild Calcification-none LEFT PELVIS: Left Common Iliac Artery - Minimal Diameter-8.2 x 8.7 mm Tortuosity-mild Calcification-moderate Left External Iliac Artery - Minimal Diameter-6.3 x 7.2 mm Tortuosity-mild Calcification-none Left Common Femoral Artery - Minimal Diameter-6.8 x 6.8 mm Tortuosity-mild Calcification-none Review of the MIP images confirms the above findings. IMPRESSION: 1. Vascular findings and measurements pertinent to potential TAVR procedure, as detailed above. 2. Severe thickening calcification of the aortic valve, compatible with reported clinical history of severe aortic stenosis. 3. Cardiomegaly with findings suggestive of congestive heart failure, including evidence of mild  interstitial pulmonary edema and small bilateral pleural effusions, as detailed above. 4. Aortic atherosclerosis, in addition to left main and 3 vessel coronary artery disease. There is also evidence of prior LAD territory myocardial infarction(s), as above. 5. Severe dilatation of the pulmonic trunk (4.3 cm in diameter), concerning for pulmonary arterial hypertension. 6. Heterogeneous hepatic steatosis. 7. Additional incidental findings, as above. Electronically Signed   By: Daniel  Entrikin M.D.   On: 10/02/2021 10:12  ° °CT ANGIO ABDOMEN PELVIS  W &/OR WO CONTRAST ° °Result Date: 10/02/2021 °CLINICAL DATA:  75-year-old female with history of severe aortic stenosis. Preprocedural study prior to potential transcatheter aortic valve replacement (TAVR) procedure. EXAM: CT ANGIOGRAPHY CHEST, ABDOMEN AND PELVIS TECHNIQUE: Multidetector CT imaging through the chest, abdomen and pelvis was performed using the standard protocol during bolus administration of intravenous contrast. Multiplanar reconstructed images and MIPs were obtained and reviewed to evaluate the vascular anatomy. CONTRAST:  100mL OMNIPAQUE IOHEXOL 350 MG/ML SOLN COMPARISON:  None. FINDINGS: CTA CHEST FINDINGS Cardiovascular: Heart size is severely enlarged. There is no significant pericardial fluid, thickening or pericardial calcification. There is aortic atherosclerosis, as well as atherosclerosis of the great vessels of the mediastinum and the coronary arteries, including calcified atherosclerotic plaque in the left main, left anterior descending, left circumflex and right coronary arteries. Myocardial thinning and subendocardial hypoattenuation throughout the mid to distal LAD territory, likely sequela of prior LAD territory myocardial infarction(s). This is associated with some mild aneurysmal dilatation of the left ventricular apex. Severe dilatation of the pulmonic trunk (4.3 cm in diameter), concerning for pulmonary arterial hypertension. Severe  thickening calcification of the aortic valve. Status post mitral   valve replacement with mechanical mitral valve. Calcifications of the mitral annulus. Left-sided pacemaker device in place with lead tips terminating in the right atrial appendage and right ventricular apex. Mediastinum/Lymph Nodes: No pathologically enlarged mediastinal or hilar lymph nodes. Hilar esophagus is unremarkable in appearance. No axillary lymphadenopathy. Lungs/Pleura: Small bilateral pleural effusions (right greater than left) lying dependently. Scattered areas of subsegmental atelectasis are noted in the lower lungs, along with near complete atelectasis of the right middle lobe. There is a background of mild diffuse ground-glass attenuation and interlobular septal thickening throughout the lungs bilaterally, suggesting the presence of mild interstitial pulmonary edema. Musculoskeletal/Soft Tissues: Median sternotomy wires. There are no aggressive appearing lytic or blastic lesions noted in the visualized portions of the skeleton. CTA ABDOMEN AND PELVIS FINDINGS Hepatobiliary: Heterogeneous attenuation throughout the hepatic parenchyma, with large areas of low attenuation most evident in the right lobe of the liver, favored to reflect a background of heterogeneous hepatic steatosis. No definite focal cystic or solid hepatic lesions. No intra or extrahepatic biliary ductal dilatation. Status post cholecystectomy. Pancreas: No pancreatic mass. No pancreatic ductal dilatation. No pancreatic or peripancreatic fluid collections or inflammatory changes. Spleen: Small splenule inferior to the spleen. Otherwise, unremarkable. Adrenals/Urinary Tract: Mild multifocal cortical thinning in both kidneys. Subcentimeter low-attenuation lesion in the upper pole of the left kidney, too small to characterize, but statistically likely to represent a tiny cyst. Bilateral adrenal glands are normal in appearance. No hydroureteronephrosis. Urinary bladder is  normal in appearance. Stomach/Bowel: The appearance of the stomach is normal. There is no pathologic dilatation of small bowel or colon. The appendix is not confidently identified and may be surgically absent. Regardless, there are no inflammatory changes noted adjacent to the cecum to suggest the presence of an acute appendicitis at this time. Vascular/Lymphatic: Aortic atherosclerosis, with vascular findings and measurements pertinent to potential TAVR procedure, as detailed below. No aneurysm or dissection noted in the abdominal or pelvic vasculature. No lymphadenopathy identified in the abdomen or pelvis. Reproductive: Uterus and ovaries are atrophic. Other: No significant volume of ascites.  No pneumoperitoneum. Musculoskeletal: There are no aggressive appearing lytic or blastic lesions noted in the visualized portions of the skeleton. VASCULAR MEASUREMENTS PERTINENT TO TAVR: AORTA: Minimal Aortic Diameter-13 x 14 mm Severity of Aortic Calcification-moderate to severe RIGHT PELVIS: Right Common Iliac Artery - Minimal Diameter-7.7 x 7.1 mm Tortuosity-mild Calcification-moderate Right External Iliac Artery - Minimal Diameter-7.1 x 7.1 mm Tortuosity-mild-to-moderate Calcification-none Right Common Femoral Artery - Minimal Diameter-6.5 x 6.3 mm Tortuosity-mild Calcification-none LEFT PELVIS: Left Common Iliac Artery - Minimal Diameter-8.2 x 8.7 mm Tortuosity-mild Calcification-moderate Left External Iliac Artery - Minimal Diameter-6.3 x 7.2 mm Tortuosity-mild Calcification-none Left Common Femoral Artery - Minimal Diameter-6.8 x 6.8 mm Tortuosity-mild Calcification-none Review of the MIP images confirms the above findings. IMPRESSION: 1. Vascular findings and measurements pertinent to potential TAVR procedure, as detailed above. 2. Severe thickening calcification of the aortic valve, compatible with reported clinical history of severe aortic stenosis. 3. Cardiomegaly with findings suggestive of congestive heart  failure, including evidence of mild interstitial pulmonary edema and small bilateral pleural effusions, as detailed above. 4. Aortic atherosclerosis, in addition to left main and 3 vessel coronary artery disease. There is also evidence of prior LAD territory myocardial infarction(s), as above. 5. Severe dilatation of the pulmonic trunk (4.3 cm in diameter), concerning for pulmonary arterial hypertension. 6. Heterogeneous hepatic steatosis. 7. Additional incidental findings, as above. Electronically Signed   By: Daniel  Entrikin M.D.   On: 10/02/2021 10:12   ° °  Cardiac Studies: ° ECG: AV pacing LBBB ° ° Telemetry: AV pacing  ° Echo: EF 30-35% severe AS mean gradient 53 mmHg ° °Medications: °  ° atorvastatin  80 mg Oral QHS  ° docusate sodium  100 mg Oral BID  ° feeding supplement  237 mL Oral TID BM  ° furosemide  40 mg Intravenous BID  ° levothyroxine  75 mcg Oral QAC breakfast  ° mouth rinse  15 mL Mouth Rinse BID  ° multivitamin with minerals  1 tablet Oral Daily  ° pantoprazole  40 mg Oral Daily  ° sodium chloride flush  3 mL Intravenous Q12H  ° sodium chloride flush  3 mL Intravenous Q12H  ° zolpidem  5 mg Oral QHS  ° °  ° sodium chloride    ° sodium chloride 10 mL/hr at 10/03/21 0603  ° heparin 850 Units/hr (10/03/21 0320)  ° ° °Assessment/Plan:  ° °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 Feeling better with diuresis . Continue iv lasix change to PO post cath in am  °AS:  read her TAVR CT this am suitable for a 26 mm Sapient 3 valve structural team following °MVR:  normal function on CT no pannus/thrombus low mean gradient on CT transitioned to heparin INR 1.8 this am Resume coumadin post cath with bridge  °CAD:  distant stent to LAD It is patent on CT but needs right and left cath today to assess cors for TAVR. Note previous cath unsuccessful from right radial would use RFA or left wrist ° °Discusse case with Dr Cooper  ° °[name] [name] °10/03/2021, 8:45 AM ° ° ° °

## 2021-10-03 NOTE — Progress Notes (Signed)
PROGRESS NOTE  [name] [name] [id number] DOB: 1946-01-21 DOA: 09/30/2021 PCP: Marylen Ponto, MD  Brief History   [name] [name] is a 75 y.o. 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  She was previously admitted from 11/8-12 with influenza A and CHF exacerbation.  She reports that she did get better after that hospitalization but then got worse again.  She is planned for TAVR and came in today for CT study but was too SOB with orthopnea and marked LE edema and so was sent to the ER for evaluation.  She is not normally on home O2 and current O2 sats are 92-93% on 4L [place] O2.  She does tell the same story multiple times about multiple issues and is mildly tangential.  ED Course: Heart failure exacerbation.  H/o rheumatic heart disease.  Going for TAVR eval today but reported more SOB and oliguria.  Creatinine at baseline.  Diuresing well here.  BNP elevated, troponin likely demand.  On 2L to keep sats >80%.   [facility] were consulted to admit the patient for further evaluation and care.  Cardiology was consulted.   Currently the patient is requiring 2L by [place] to keep saturations greater than 93%. She is being diuresed, and is currently negative  2009.6 cc in terms of fluid balance.   Pt underwent left and right heart catheterization which demonstrated stable CAD. The plan will be to transition the patient to lovenox for discharge to home. She will then return on 12/26 for TAVR on 10/14/2021.  Consultants  Cardiology  Procedures  None  Antibiotics   Anti-infectives (From admission, onward)    None      Subjective  The patient is sitting up at bedside. She states that she doesn't feel well.  Objective   Vitals:  Vitals:   10/03/21 1038 10/03/21 1144  BP:   (!) 145/60  Pulse:    Resp:    Temp:    SpO2: 96%     Exam:  Constitutional:  The patient is awake, alert, and oriented x 3. No acute Respiratory:  No increased work of breathing. No wheezes, rales, or rhonchi No tactile fremitus Cardiovascular:  Regular rate and rhythm No murmurs, ectopy, or gallups. Positive for scattered rales. No lateral PMI. No thrills. Abdomen:  Abdomen is soft, non-tender, non-distended No hernias, masses, or organomegaly Normoactive bowel sounds.  Musculoskeletal:  No cyanosis or clubbing +1 pitting edema on lower extremities bilaterally Skin:  No rashes, lesions, ulcers palpation of skin: no induration or nodules Neurologic:  CN 2-12 intact Sensation all 4 extremities intact Psychiatric:  Mental status Mood, affect appropriate Orientation to person, place, time  judgment and insight appear intact I have personally reviewed the following:   Today's Data  Vitals  Lab Data  BMP CBC  Imaging  X-ray abdomen X-ray CXR  Cardiology Data  EKG  Scheduled Meds:  atorvastatin  80 mg Oral QHS   docusate sodium  100 mg Oral BID   [START ON 10/04/2021] enoxaparin (LOVENOX) injection  1 mg/kg Subcutaneous Q12H   feeding supplement  237 mL Oral TID BM   furosemide  40 mg Intravenous BID   levothyroxine  75 mcg Oral QAC breakfast   mouth rinse  15 mL Mouth Rinse BID   multivitamin with minerals  1 tablet Oral  Daily   pantoprazole  40 mg Oral Daily   sodium chloride flush  3 mL Intravenous Q12H   sodium chloride flush  3 mL Intravenous Q12H   sodium chloride flush  3 mL Intravenous Q12H   zolpidem  5 mg Oral QHS   Continuous Infusions:  sodium chloride      Principal Problem:   Acute on chronic combined systolic and diastolic CHF (congestive heart failure) (HCC) Active Problems:   Essential hypertension   PACEMAKER, PERMANENT   Atrial arrhythmia   Hypothyroidism   Severe aortic stenosis   CKD (chronic kidney disease) stage 3, GFR  30-59 ml/min (HCC)   AKI (acute kidney injury) ([place])   DNR (do not resuscitate)   LOS: 3 days   A & P  Acute on chronic combined CHF -Patient with known h/o chronic systolic CHF presenting with worsening SOB and hypoxia, also with edema and orthopnea -CXR consistent with mild pulmonary edema -Significantly elevated BNP compared to prior -With elevated BNP and abnl CXR, acute decompensated CHF seems probable as diagnosis -Will admit, as per the Emergency HF Mortality Risk Grade.  The patient has: severe pulmonary edema requiring new O2 therapy -07/2021 echo with EF 35-40% and grade 3 diastolic dysfunction; will not repeat -CHF order set utilized -Cardiology consulted -Was given Lasix 40 mg x 1 in ER and will continue with 40 mg IV BID -Continue [place] O2 for now. Requiring 2L by [place] to maintain SaO2 of 93%. Wean as possible.  -Creatinine improving. Monitor creatinine, electrolytes and volume status. -Mildly elevated HS troponin is likely related to demand ischemia; doubt ACS based on symptoms  -Pt underwent LHC today. She has tolerated the procedure well.  - She will be restarted on Lovenox. She will be discharged on Lovenox and then return on 10/13/2021 prior to her procedure on 10/14/2021.  Valvular heart disease -She is s/p MVR, on Coumadin; coumadin is held. Continue full dose Lovenox after LHC. -She is undergoing evaluation for TAVR on 10/14/2021   CAD -s/p stent -No current obvious c/o chest pain   Afib -Has pacemaker -Rate controlled with [place] -Continue Coumadin - pharmacy to dose   HTN -Hold candesartan due to marginal BP and mild AKI -Continue Diltiazem -Will also add prn hydralazine   HLD -Continue Lipitor   AKI on Stage 3a CKD -Slightly worse than baseline, will trend and attempt to avoid nephrotoxic meds -Baseline creatinine appears to be 0.94. Creatinine is 1.14 today.   Hypothyroidism -Continue Synthroid   Psych -Continue Xanax, Ambien -Patient appeared  to have mild cognitive impairment at the time of my evaluation -Suggest outpatient evaluation   Note: This patient has been tested and is negative for the novel coronavirus COVID-19. She has NOT been vaccinated against COVID-19.    I have seen and examined this patient myself. I have spent 34 minutes in her evaluation and care.   Level of care: Telemetry Cardiac  DVT prophylaxis: Coumadin Code Status:  DNR - confirmed with patient Family Communication: None present; she declined to have me call family at the time of admission Disposition Plan:  The patient is from: home             Anticipated d/c is to: home, possibly with [facility] services             Anticipated d/c date will depend on clinical response to treatment, likely 2-4 days             Patient is currently: acutely ill  Brittane Grudzinski, DO [facility] Direct contact: see [email or web address]  7PM-7AM contact night coverage as above 10/03/2021, 5:42 PM  LOS: 1 day

## 2021-10-03 NOTE — Progress Notes (Signed)
PT. Requesting cardizem. Medication not scheduled. Pt. Anxious about not having med. On call for [facility] paged to make aware.

## 2021-10-04 LAB — BASIC METABOLIC PANEL
Anion gap: 7 (ref 5–15)
BUN: 11 mg/dL (ref 8–23)
CO2: 32 mmol/L (ref 22–32)
Calcium: 8.7 mg/dL — ABNORMAL LOW (ref 8.9–10.3)
Chloride: 95 mmol/L — ABNORMAL LOW (ref 98–111)
Creatinine, Ser: 1.02 mg/dL — ABNORMAL HIGH (ref 0.44–1.00)
GFR, Estimated: 58 mL/min — ABNORMAL LOW (ref >60)
Glucose, Bld: 108 mg/dL — ABNORMAL HIGH (ref 70–99)
Potassium: 3.5 mmol/L (ref 3.5–5.1)
Sodium: 134 mmol/L — ABNORMAL LOW (ref 135–145)

## 2021-10-04 LAB — CBC
HCT: 35.3 % — ABNORMAL LOW (ref 36.0–46.0)
Hemoglobin: 11.3 g/dL — ABNORMAL LOW (ref 12.0–15.0)
MCH: 28.3 pg (ref 26.0–34.0)
MCHC: 32 g/dL (ref 30.0–36.0)
MCV: 88.5 fL (ref 80.0–100.0)
Platelets: 162 10*3/uL (ref 150–400)
RBC: 3.99 MIL/uL (ref 3.87–5.11)
RDW: 16.1 % — ABNORMAL HIGH (ref 11.5–15.5)
WBC: 5.3 10*3/uL (ref 4.0–10.5)
nRBC: 0 % (ref 0.0–0.2)

## 2021-10-04 LAB — PROTIME-INR
INR: 1.7 — ABNORMAL HIGH (ref 0.8–1.2)
Prothrombin Time: 19.5 seconds — ABNORMAL HIGH (ref 11.4–15.2)

## 2021-10-04 LAB — D-DIMER, QUANTITATIVE: D-Dimer, Quant: 1.86 ug/mL-FEU — ABNORMAL HIGH (ref 0.00–0.50)

## 2021-10-04 MED ORDER — ADULT MULTIVITAMIN W/MINERALS CH: 1.0000 | ORAL_TABLET | Freq: Every day | ORAL | 0 refills | Status: DC

## 2021-10-04 MED ORDER — ADULT MULTIVITAMIN W/MINERALS CH
1.0000 | ORAL_TABLET | Freq: Every day | ORAL | 0 refills | Status: DC
Start: 2021-10-04 — End: 2021-10-04
  Filled 2021-10-04: qty 30, 30d supply, fill #0

## 2021-10-04 MED ORDER — BISOPROLOL FUMARATE 5 MG PO TABS: 5.0000 mg | ORAL_TABLET | Freq: Every day | ORAL | 0 refills | Status: DC

## 2021-10-04 MED ORDER — ENOXAPARIN SODIUM 60 MG/0.6ML IJ SOSY: 60.0000 mg | PREFILLED_SYRINGE | Freq: Two times a day (BID) | INTRAMUSCULAR | 0 refills | Status: DC

## 2021-10-04 MED ORDER — DOCUSATE SODIUM 100 MG PO CAPS
100.0000 mg | ORAL_CAPSULE | Freq: Two times a day (BID) | ORAL | 0 refills | Status: DC
  Filled 2021-10-04: qty 10, 5d supply, fill #0

## 2021-10-04 MED ORDER — BISOPROLOL FUMARATE 5 MG PO TABS
5.0000 mg | ORAL_TABLET | Freq: Every day | ORAL | Status: DC
  Administered 2021-10-05 – 2021-10-06 (×2): 5 mg via ORAL
  Filled 2021-10-04 (×2): qty 1

## 2021-10-04 MED ORDER — PANTOPRAZOLE SODIUM 40 MG PO TBEC
40.0000 mg | DELAYED_RELEASE_TABLET | Freq: Every day | ORAL | 0 refills | Status: DC
  Filled 2021-10-04: qty 30, 30d supply, fill #0

## 2021-10-04 MED ORDER — PANTOPRAZOLE SODIUM 40 MG PO TBEC: 40.0000 mg | DELAYED_RELEASE_TABLET | Freq: Every day | ORAL | 0 refills | Status: DC

## 2021-10-04 MED ORDER — BISOPROLOL FUMARATE 5 MG PO TABS
5.0000 mg | ORAL_TABLET | Freq: Every day | ORAL | 0 refills | Status: DC
  Filled 2021-10-04: qty 30, 30d supply, fill #0

## 2021-10-04 MED ORDER — DOCUSATE SODIUM 100 MG PO CAPS: 100.0000 mg | ORAL_CAPSULE | Freq: Two times a day (BID) | ORAL | 0 refills | Status: DC

## 2021-10-04 NOTE — Progress Notes (Signed)
Patient is mad and anxious about not receiving her cardizem, says she is in Afib and her HR is going up, currently in the high 90's. Mds made aware and they are working on it.

## 2021-10-04 NOTE — Progress Notes (Signed)
When in patient room to asked her about taking the ordered bisoprolol to help control her Heart Rate, patient and daughter in room told this RN that she had taken her home dose of her Cardizem.

## 2021-10-04 NOTE — Progress Notes (Signed)
Occupational Therapy Treatment Patient Details Name: [name] [name] MRN: [id number] DOB: 1946/08/20 Today's Date: 10/04/2021   History of present illness 75 y.o. female presenting 09/30/21 with SOB and urinary retention. Admitted with volume overload and acute on chronic combined CHF. Plan for [facility] 12/16. 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 Pt presents with increased anxiety/frustration regarding elevated HR and recently changed medications. OT notified nurse that pt had questions and concerns, nurse present at end of session to speak further with pt/daughter. OT made multiple attempts to re-direct pt to functional ADLs/activities, however pt perseverated on HR/frustrations. Pt did stand from chair with mod I and marched in place with LOB. Educated pt/daughter on energy conservation, PLB, O2 safety management, activity pacing, and process of acute care OT/beyond. [name] plans on residing with pt until pt returns in a week or so for a cardiac procedure. OT will continue following pt acutely. Pt would benefit from continued skilled acute care OT services to maximize independence with ADLs/ADL mobility.    Recommendations for follow up therapy are one component of a multi-disciplinary discharge planning process, led by the attending physician.  Recommendations may be updated based on patient status, additional functional criteria and insurance authorization.    Follow Up Recommendations  No OT follow up    Assistance Recommended at Discharge PRN  Equipment Recommendations  None recommended by OT       Precautions / Restrictions Precautions Precautions: Other (comment) Precaution Comments: Watch O2 and HR Restrictions Weight Bearing Restrictions: No        Mobility Bed Mobility  N/A this session   Transfers Overall transfer level: Modified independent Equipment used: None       General transfer comment: stood from chair with mod I and no mobility device, able to march in place with mod I         ADL either performed or assessed with clinical judgement   ADL Overall ADL's : Modified independent       General ADL Comments: mod I-supervison for ADLs/ADL mobility, pt stressed about HR/meds therefore full OOB ADL treatment not completed; educated more on energy conservation, safety awareness, and PLB     Cognition Arousal/Alertness: Awake/alert Behavior During Therapy: Agitated;Anxious Overall Cognitive Status: Difficult to assess     General Comments: pt with increase anxiety today 2/2 elevated HR, pt required increased cues to focus on task at hand, pt easily distracted today and perseverated on talking to the doctor about her HR/meds                General Comments skin intact, HR elevated from 100-110bpm 2/2 increased anxiety, nurse aware    Pertinent Vitals/ Pain       Pain Assessment: No/denies pain (however later on c/o nausea) Pain Intervention(s): Repositioned;Other (comment) (notified nurse of nausea)  Home Living   Prior Functioning/Environment    Frequency  Min 2X/week     Progress Toward Goals  OT Goals(current goals can now be found in the care plan section)  Progress towards OT goals: Progressing toward goals  Acute Rehab OT Goals Patient Stated Goal: return home with HR lowered OT Goal Formulation: With patient Time For Goal Achievement: 10/15/21 Potential to Achieve Goals: Good ADL Goals Pt Will Perform Grooming: Independently;standing Pt Will Perform Lower  Body Bathing: Independently;sit to/from stand Pt Will Perform Lower Body Dressing: Independently;sit to/from stand Pt Will Transfer to Toilet: Independently;regular height toilet;ambulating Pt Will Perform Toileting - Clothing  Manipulation and hygiene: Independently;sit to/from stand  Plan Discharge plan remains appropriate    AM-PAC OT "6 Clicks" Daily Activity     Outcome Measure   Help from another person eating meals?: None Help from another person taking care of personal grooming?: None Help from another person toileting, which includes using toliet, bedpan, or urinal?: None Help from another person bathing (including washing, rinsing, drying)?: None Help from another person to put on and taking off regular upper body clothing?: None Help from another person to put on and taking off regular lower body clothing?: None 6 Click Score: 24       OT Visit Diagnosis: Other (comment);Muscle weakness (generalized) (M62.81)   Activity Tolerance Treatment limited secondary to agitation (pt too worried about HR and meds)   Patient Left in chair;with call bell/phone within reach;with nursing/sitter in room   Nurse Communication Mobility status;Other (comment) (questions for doctor)      Time: 2111-5520 OT Time Calculation (min): 19 min  Charges: OT General Charges $OT Visit: 1 Visit OT Treatments $Therapeutic Activity: 8-22 mins  Norris Cross, OTR/L [facility] [phone]   [name] 10/04/2021, 1:34 PM

## 2021-10-04 NOTE — Progress Notes (Signed)
PROGRESS NOTE  [name] [name] [id number] DOB: Jun 30, 1946 DOA: 09/30/2021 PCP: Ronita Hipps, MD  Brief History   [name] [name] is a 75 y.o. 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  She was previously admitted from 11/8-12 with influenza A and CHF exacerbation.  She reports that she did get better after that hospitalization but then got worse again.  She is planned for TAVR and came in today for CT study but was too SOB with orthopnea and marked LE edema and so was sent to the ER for evaluation.  She is not normally on home O2 and current O2 sats are 92-93% on 4L [place] O2.  She does tell the same story multiple times about multiple issues and is mildly tangential.  ED Course: Heart failure exacerbation.  H/o rheumatic heart disease.  Going for TAVR eval today but reported more SOB and oliguria.  Creatinine at baseline.  Diuresing well here.  BNP elevated, troponin likely demand.  On 2L to keep sats >80%.   [facility] were consulted to admit the patient for further evaluation and care.  Cardiology was consulted.   Currently the patient is requiring 2L by [place] to keep saturations greater than 93%. She is being diuresed, and is currently negative  2009.6 cc in terms of fluid balance.   Pt underwent left and right heart catheterization which demonstrated stable CAD. The plan will be to transition the patient to lovenox for discharge to home. She will then return on 12/26 for TAVR on 10/14/2021.  Overnight on 10/03/2021-10/04/2021 the patient became very focused on the fact that her diltiazem CD 240 mg that she had been on at home had been discontinued. It was discontinued, because the patient has CHF with reduced EF of 35-40%. Diltiazem is contraindicated for this reason. The  patient believes that she nearly died due to being placed on a beta blocker in the past, because it caused her to be bradycardic. It is not known in which context this medication was started, or what dose. Cardiology had recommended initiation of bisoprolol to address the patient's increasing heart rate in the setting of atrial fibrillation.   The patient demanded to take her diltiazem CD 240 mg which had not been restarted. Dr. Gasper Sells and I both explained to her why we would not be restarting it, and why this was not in her best interest. The patient had threatened to leave AMA "have a heart attack and sue everyone", if it wasn't restarted.  As I did not want for the patient to simply leave AMA without prescriptions, instructions, or oxygen, I discharged her including home O2 as she was hypoxic without O2 this morning. Then I learned from nursing that against our (mine and Dr. Consepcion Hearing) instructions the patient took the Diltiazem CD 240 mg that was brought in by her daughter. She then told her nurse that she did not want to leave the hospital anymore, but did want to talk to hospice.  Due to the patient's severely anxious and labile mood today, I will not address the statement about hospice today. I have cancelled the discharge. The patient has agreed to not take any other medications that have not been prescribed for her as inpatient. She has received her prn xanax. Frequency of the xanax has been increased.   I  have discussed today's events with Dr. Izora Ribas. He recommends starting bisoprolol in the morning and monitoring the patient on the medication for a day. He has discussed the patient with her cardiologist, Dr. Excell Seltzer.   Consultants  Cardiology  Procedures  None  Antibiotics   Anti-infectives (From admission, onward)    None      Subjective  The patient is lying in bed. She is tachypneic and tearful.  Objective   Vitals:  Vitals:   10/03/21 2200 10/04/21 0356   BP: (!) 141/58 133/69  Pulse:    Resp: 20 20  Temp:  97.8 F (36.6 C)  SpO2:  91%    Exam:  Constitutional:  The patient is awake, alert, and oriented x 3. She is in mild distress due to anxiety. Respiratory:  No increased work of breathing. No wheezes, rales, or rhonchi No tactile fremitus Cardiovascular:  Regular rate and rhythm No murmurs, ectopy, or gallups. Positive for scattered rales. No lateral PMI. No thrills. Abdomen:  Abdomen is soft, non-tender, non-distended No hernias, masses, or organomegaly Normoactive bowel sounds.  Musculoskeletal:  No cyanosis or clubbing +1 pitting edema on lower extremities bilaterally Skin:  No rashes, lesions, ulcers palpation of skin: no induration or nodules Neurologic:  CN 2-12 intact Sensation all 4 extremities intact Psychiatric:  Mental status Mood, affect anxious Orientation to person, place, time  judgment and insight appear poor I have personally reviewed the following:   Today's Data  Vitals  Lab Data  BMP CBC  Imaging  X-ray abdomen X-ray CXR  Cardiology Data  EKG  Scheduled Meds:  atorvastatin  80 mg Oral QHS   bisoprolol  5 mg Oral Daily   docusate sodium  100 mg Oral BID   enoxaparin (LOVENOX) injection  1 mg/kg Subcutaneous Q12H   feeding supplement  237 mL Oral TID BM   furosemide  40 mg Intravenous BID   levothyroxine  75 mcg Oral QAC breakfast   mouth rinse  15 mL Mouth Rinse BID   multivitamin with minerals  1 tablet Oral Daily   pantoprazole  40 mg Oral Daily   sodium chloride flush  3 mL Intravenous Q12H   sodium chloride flush  3 mL Intravenous Q12H   sodium chloride flush  3 mL Intravenous Q12H   zolpidem  5 mg Oral QHS   Continuous Infusions:  sodium chloride      Principal Problem:   Acute on chronic combined systolic and diastolic CHF (congestive heart failure) (HCC) Active Problems:   Essential hypertension   PACEMAKER, PERMANENT   Atrial arrhythmia   Hypothyroidism    Severe aortic stenosis   CKD (chronic kidney disease) stage 3, GFR 30-59 ml/min (HCC)   AKI (acute kidney injury) (HCC)   DNR (do not resuscitate)   LOS: 4 days   A & P  Acute on chronic combined CHF -Patient with known h/o chronic systolic CHF presenting with worsening SOB and hypoxia, also with edema and orthopnea -CXR consistent with mild pulmonary edema -Significantly elevated BNP compared to prior -With elevated BNP and abnl CXR, acute decompensated CHF seems probable as diagnosis -Will admit, as per the Emergency HF Mortality Risk Grade.  The patient has: severe pulmonary edema requiring new O2 therapy -07/2021 echo with EF 35-40% and grade 3 diastolic dysfunction; will not repeat -CHF order set utilized -Cardiology consulted -Was given Lasix 40 mg x 1 in ER and will continue with 40 mg IV BID -Continue [place] O2 for now. Requiring 2L by  [place] to maintain SaO2 of 93%. Wean as possible.  -Creatinine improving. Monitor creatinine, electrolytes and volume status. -Mildly elevated HS troponin is likely related to demand ischemia; doubt ACS based on symptoms  -Pt underwent LHC today. She has tolerated the procedure well.  - She will be restarted on Lovenox. She will be discharged on Lovenox and then return on 10/13/2021 prior to her procedure on 10/14/2021. - Pt took diltiazem CD 240 mg this am against medical advice from myself and Dr. Gasper Sells. She will not discharge today. We will start bisoprolol in the am and monitor for a day at least prior to discharge.  Tachypnea/Anxiety: Pt is on 1 mg/kg lovenox. Doubt PE, but will check D dimer.  Valvular heart disease -She is s/p MVR, on Coumadin; coumadin is held. Continue full dose Lovenox after LHC. -She is undergoing evaluation for TAVR on 10/14/2021   CAD -s/p stent -No current obvious c/o chest pain   Afib -Has pacemaker -Rate increased today. -Patient has taken diltiazem CD 240 mg against medical advice. She agrees not to do  this again. -The patient will be started on Bisoprolol in the am. She will be monitored while taking this medication for 24 hrs prior to discharge. -She is on 1 mg/kg Lovenox and will continue on this until she presents again to [facility] hospital for TAVR.   HTN -Hold candesartan due to marginal BP and mild AKI -Continue Diltiazem -Will also add prn hydralazine   HLD -Continue Lipitor   AKI on Stage 3a CKD -Slightly worse than baseline, will trend and attempt to avoid nephrotoxic meds -Baseline creatinine appears to be 0.94. Creatinine is 1.02 today.   Hypothyroidism -Continue Synthroid   Psych -Continue Xanax, Ambien -Patient appeared to have mild cognitive impairment at the time of my evaluation. She appears to be having a panic attack. She has had xanax 1.0 mg this morning. She is still anxious. -Suggest outpatient evaluation   Note: This patient has been tested and is negative for the novel coronavirus COVID-19. She has NOT been vaccinated against COVID-19.    I have seen and examined this patient myself. I have spent 32 minutes in her evaluation and care.   Level of care: Telemetry Cardiac  DVT prophylaxis: Coumadin Code Status:  DNR - confirmed with patient Family Communication: None present; she declined to have me call family at the time of admission Disposition Plan:  The patient is from: home             Anticipated d/c is to: home, possibly with [facility] services             Anticipated d/c date will depend on clinical response to treatment, likely 2-4 days             Patient is currently: acutely ill  Analia Zuk, DO [facility] Direct contact: see [email or web address]  7PM-7AM contact night coverage as above 10/04/2021, 3:32 PM  LOS: 1 day

## 2021-10-04 NOTE — Progress Notes (Addendum)
Progress Note  Patient Name: [name] [name] Date of Encounter: 10/04/2021  Primary Cardiologist: Sherren Mocha, MD   Subjective   Patient is concerned that her diltiazem was stopped.  She notes that all beta blockers kill er (has previously had low heart rate with metoprolol and atenolol).  We discussed that in addition to critical AS she has been diagnosed with HFrEF (EF 35-40% per chart review).  We discussed diltiazem contraindication with HFrEF.  Inpatient Medications    Scheduled Meds:  atorvastatin  80 mg Oral QHS   bisoprolol  5 mg Oral Daily   docusate sodium  100 mg Oral BID   enoxaparin (LOVENOX) injection  1 mg/kg Subcutaneous Q12H   feeding supplement  237 mL Oral TID BM   furosemide  40 mg Intravenous BID   levothyroxine  75 mcg Oral QAC breakfast   mouth rinse  15 mL Mouth Rinse BID   multivitamin with minerals  1 tablet Oral Daily   pantoprazole  40 mg Oral Daily   sodium chloride flush  3 mL Intravenous Q12H   sodium chloride flush  3 mL Intravenous Q12H   sodium chloride flush  3 mL Intravenous Q12H   zolpidem  5 mg Oral QHS   Continuous Infusions:  sodium chloride     PRN Meds: sodium chloride, acetaminophen, ALPRAZolam, alum & mag hydroxide-simeth, baclofen, bisacodyl, hydrALAZINE, morphine injection, oxyCODONE, polyethylene glycol, sodium chloride flush, traZODone   Vital Signs    Vitals:   10/03/21 1951 10/03/21 2200 10/04/21 0018 10/04/21 0356  BP: 123/90 (!) 141/58  133/69  Pulse:      Resp: (!) 24 20  20   Temp: 98.4 F (36.9 C)   97.8 F (36.6 C)  TempSrc: Oral   Oral  SpO2: 96%   91%  Weight:   54.8 kg   Height:        Intake/Output Summary (Last 24 hours) at 10/04/2021 0920 Last data filed at 10/04/2021 [id number] Gross per 24 hour  Intake 480 ml  Output 800 ml  Net -320 ml   Filed Weights   10/02/21 1345 10/03/21 0019 10/04/21 0018  Weight: 54.8 kg 55.1 kg 54.8 kg    Telemetry    AF rate 90s-100s - Personally  Reviewed  ECG    No new - Personally Reviewed  Physical Exam   Patient would not let me examine her.  Notes that "everyone is trying to kill her" by not giving her diltiazem in the setting of HFrEF.  She would like to leave AMA and "sue the [expletive] out of everyone here."  Will defer exam secondary to patients wishes.     Labs    Chemistry Recent Labs  Lab 09/30/21 1302 10/01/21 0400 10/02/21 0241 10/03/21 1104 10/03/21 1115 10/04/21 0237  NA  --  132* 135 135 125* 134*  K  --  2.8* 3.5 3.8 3.5 3.5  CL  --  97* 100  --   --  95*  CO2  --  26 24  --   --  32  GLUCOSE  --  90 111*  --   --  108*  BUN  --  15 14  --   --  11  CREATININE  --  1.14* 1.14*  --   --  1.02*  CALCIUM  --  8.5* 8.5*  --   --  8.7*  PROT 7.0  --   --   --   --   --  ALBUMIN 4.1  --   --   --   --   --   AST 26  --   --   --   --   --   ALT 17  --   --   --   --   --   ALKPHOS 84  --   --   --   --   --   BILITOT 1.3*  --   --   --   --   --   GFRNONAA  --  51* 51*  --   --  58*  ANIONGAP  --  9 11  --   --  7     Hematology Recent Labs  Lab 10/01/21 0400 10/03/21 0230 10/03/21 1104 10/03/21 1115 10/04/21 0237  WBC 4.7 5.2  --   --  5.3  RBC 3.76* 4.00  --   --  3.99  HGB 10.7* 11.3* 12.6 12.2 11.3*  HCT 33.5* 35.1* 37.0 36.0 35.3*  MCV 89.1 87.8  --   --  88.5  MCH 28.5 28.3  --   --  28.3  MCHC 31.9 32.2  --   --  32.0  RDW 16.2* 16.0*  --   --  16.1*  PLT 147* 166  --   --  162    Cardiac EnzymesNo results for input(s): TROPONINI in the last 168 hours. No results for input(s): TROPIPOC in the last 168 hours.   BNP Recent Labs  Lab 09/30/21 1103  BNP 2,815.3*     DDimer No results for input(s): DDIMER in the last 168 hours.   Radiology    CARDIAC CATHETERIZATION  Result Date: 10/03/2021   Mid LAD lesion is 20% stenosed.   1st Mrg lesion is 40% stenosed.   Prox RCA lesion is 30% stenosed.   Hemodynamic findings consistent with moderate pulmonary hypertension.    There is severe aortic valve stenosis. 1.  Patent coronary arteries with diffuse nonobstructive CAD, continued patency of the mid LAD stent 2.  Known severe aortic stenosis with heavy calcification and restriction of the aortic valve leaflets 3.  Normal fluoroscopic motion of the patient's mechanical mitral prosthesis 4.  Moderate pulmonary hypertension with mean PA pressure 36 mmHg, transpulmonary gradient 13 mmHg, PVR 3.8 Wood units Recommend: Resume warfarin today, start Lovenox tomorrow morning for bridging, tentatively plan TAVR 12/27 we will coordinate hospital admission for bridging into the procedure.  Medical therapy for nonobstructive CAD.  Continue IV diuresis today with elevated filling pressures.       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%, AF on AC, MVR on AC and CAD - planned discharge for 10/05/21 with plans for re-admission and subsequent TAVR S/ PPM - attempted to discuss trial if bisoprolol with patient, does not feel comfortable proceeding - she was planned for transition to PO lasix today, her filling pressures were elevated, but this appears reasonable - she was planned for DC today by my partners, this is likely reasonable on lovenox - plan for lovenox, PO lasix, and readmission prior to TAVR if her O2 comes off and she does well - We have discussed the data behind diltiazem and HFrEF; I have offered bisoprolol - I was unable to reach Dr. Kennith Maes MD [phone])  Will continue to follow.  Discussed at length with patient, nursing, and primary MD  Addendum:  Attempted to talk with Ms.  Marcille [name], [name] [name], about her care.   Phone number [phone] Discussed with Dr. Gerri Lins who was able to reach family.  For questions or updates, please contact [facility] Please consult [email or web address] for contact info under Cardiology/STEMI.      Signed, Christell Constant, MD  10/04/2021, 9:20 AM

## 2021-10-04 NOTE — TOC Transition Note (Signed)
Transition of Care [facility]) - CM/SW Discharge Note   Patient Details  Name: [name] [name] MRN: [id number] Date of Birth: 09-Nov-1945  [facility] CM/SW Contact:  Lawerance Sabal, RN Phone Number: 10/04/2021, 9:59 AM   Clinical Narrative:    Home oxygen ordered through [employer]. Tanks to be delivered to the room prior to DC    Final next level of care: Home/Self Care Barriers to Discharge: No Barriers Identified   Patient Goals and CMS Choice        Discharge Placement                       Discharge Plan and Services                DME Arranged: Oxygen DME Agency: [employer] Date DME Agency Contacted: 10/04/21 Time DME Agency Contacted: 0930 Representative spoke with at DME Agency: Vaughan Basta            Social Determinants of Health (SDOH) Interventions     Readmission Risk Interventions No flowsheet data found.

## 2021-10-04 NOTE — Evaluation (Signed)
Clinical/Bedside Swallow Evaluation Patient Details  Name: [name] [name] MRN: [id number] Date of Birth: 1946-05-06  Today's Date: 10/04/2021 Time: SLP Start Time (ACUTE ONLY): [id number] SLP Stop Time (ACUTE ONLY): 0836 SLP Time Calculation (min) (ACUTE ONLY): 13 min  Past Medical History:  Past Medical History:  Diagnosis Date   Anxiety    Aortic aneurysm ([place])    Atrial arrhythmia    With prior history of atrial flutter ablation   Bradycardia    s/p pacemaker implantation   CONGESTIVE HEART FAILURE 07/31/2010   Qualifier: Diagnosis of  By: [name] Comes, MD, [name] [name]    GERD (gastroesophageal reflux disease)    Hypertension    Hypothyroidism    Long Q-T syndrome    Myocardial infarct ([place]) 11/20/2011   Stent placement for 100% blockage of LAD   PACEMAKER, 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   Qualifier: Diagnosis of  By: [name] Comes, MD, [name] [name]    Rheumatic heart disease    With mitral valve replacement, tricuspid regurgitation moderate to severe, significant left atrial enlargment   TRICUSPID REGURGITATION 12/23/2010   Qualifier: Diagnosis of  By: [name] Comes, MD, [name] [name]    Unspecified essential hypertension 07/31/2010   Qualifier: Diagnosis of  By: [name] Comes, MD, [facility], [name] [name]    Past Surgical History:  Past Surgical History:  Procedure Laterality Date   APPENDECTOMY     CARDIAC ELECTROPHYSIOLOGY STUDY AND ABLATION     Stent and pacemaker placement   CHOLECYSTECTOMY     CORONARY ANGIOPLASTY WITH STENT PLACEMENT Left 09/13/2015   Dr. Gwenith [name] at [facility], LAD stent   MITRAL VALVE REPLACEMENT  2001   mechanical valve   OOPHORECTOMY     PACEMAKER INSERTION  11/20/2007   Dual chamber pacemaker implantation with rapid ventricular pacing   RIGHT/LEFT HEART CATH AND CORONARY ANGIOGRAPHY N/A 10/03/2021   Procedure: RIGHT/LEFT HEART CATH AND CORONARY ANGIOGRAPHY;  Surgeon: [name] Mocha, MD;  Location: [place] [facility];  Service: Cardiovascular;  Laterality:  N/A;   SALPINGECTOMY Bilateral    TONSILLECTOMY     HPI:  75 y.o. female presenting 09/30/21 with SOB and urinary retention. Admitted with volume overload and acute on chronic combined CHF. s/p LHC 12/16. 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 She is hyperfocused on medications and SOB, stating that she "has been through this before and just needs to go home."  Oral motor exam unremarkable. Patient agreeable to one sip of thin liquid in which she demonstrated a normal oropharyngeal swallow before refusing further trials. She reports some mid sternal chest discomfort which she relates to a bite of banana she consumed prior to SLP entering room. Attempted to encourage patient to take sips of liquid in attempts to faciliate esophageal clearance however she refused. These complaints are consistent with previous SLP swallowing evaluations in which patient wtih likely primary esophageal dysphagia with a normal oropharyngeal swallow. In the past, patient has not wanted further testing of her swallow. No diet adjustments recommended at this time. SLP will re attempt diagnostic po trials next date if patient agreeable. SLP Visit Diagnosis: Dysphagia, unspecified (R13.10)    Aspiration Risk       Diet Recommendation Regular;Thin liquid   Liquid Administration via: Cup;Straw Medication Administration: Whole meds with liquid (crush large pills if  needed) Supervision: Patient able to self feed Compensations: Slow rate;Small sips/bites;Follow solids with liquid Postural Changes: Seated upright at 90 degrees;Remain upright for at least 30 minutes after po intake    Other  Recommendations Oral Care Recommendations: Oral care BID    Recommendations for follow  up therapy are one component of a multi-disciplinary discharge planning process, led by the attending physician.  Recommendations may be updated based on patient status, additional functional criteria and insurance authorization.  Follow up Recommendations No SLP follow up      Assistance Recommended at Discharge    Functional Status Assessment    Frequency and Duration min 1 x/week  1 week       Prognosis Barriers to Reach Goals: Other (Comment) (cooperation)      Swallow Study   General HPI: 75 y.o. female presenting 09/30/21 with SOB and urinary retention. Admitted with volume overload and acute on chronic combined CHF. s/p LHC 12/16. 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 Patient did not want f/u testing at that time. Diet Prior to this Study: Regular;Thin liquids Temperature Spikes Noted: No Respiratory Status: Room air History of Recent Intubation: No Behavior/Cognition: Alert (irritable, hyperfocused on medications) Oral Cavity Assessment: Within Functional Limits Oral Care Completed by SLP: No Oral Cavity - Dentition: Dentures, top;Dentures, bottom (patient placed but then wanted to remove) Vision: Functional for self-feeding Self-Feeding Abilities: Able to feed self Patient Positioning: Upright in chair Baseline Vocal Quality: Normal Volitional Cough:  (not tested) Volitional Swallow: Able to elicit    Oral/Motor/Sensory Function Overall Oral Motor/Sensory Function: Within functional limits   Ice Chips Ice chips: Not tested   Thin Liquid Thin Liquid: Within functional limits    Nectar Thick Nectar Thick Liquid: Not tested   Honey Thick Honey Thick Liquid:  Not tested   Puree Puree: Not tested   Solid     Solid: Not tested     [name] Lango MA, CCC-SLP  [name] [name] [name] [name] 10/04/2021,8:44 AM

## 2021-10-04 NOTE — Progress Notes (Signed)
Mobility Specialist Progress Note:   10/04/21 1402  Mobility  Activity Ambulated to bathroom  Level of Assistance Standby assist, set-up cues, supervision of patient - no hands on  Assistive Device None  Distance Ambulated (ft) 30 ft  Mobility Out of bed for toileting  Mobility Response Tolerated well  Mobility performed by Mobility specialist  $Mobility charge 1 Mobility   Pt requesting to use restroom. No complaints of pain. Pt left EOB with call bell in reach and all needs met.   [facility] Public librarian Phone [phone] Secondary Phone [phone]

## 2021-10-04 NOTE — Plan of Care (Signed)
  Problem: Safety: Goal: Ability to remain free from injury will improve Outcome: Progressing   Problem: Coping: Goal: Level of anxiety will decrease Outcome: Not Progressing   

## 2021-10-04 NOTE — Progress Notes (Signed)
SATURATION QUALIFICATIONS: (This note is used to comply with regulatory documentation for home oxygen) ° °Patient Saturations on Room Air at Rest = 86% ° °Patient Saturations on Room Air while Ambulating = 86% ° °Patient Saturations on 2 Liters of oxygen while Ambulating = 92% ° °Please briefly explain why patient needs home oxygen: °

## 2021-10-05 LAB — CBC
HCT: 36.3 % (ref 36.0–46.0)
Hemoglobin: 11.9 g/dL — ABNORMAL LOW (ref 12.0–15.0)
MCH: 28.5 pg (ref 26.0–34.0)
MCHC: 32.8 g/dL (ref 30.0–36.0)
MCV: 87.1 fL (ref 80.0–100.0)
Platelets: 176 10*3/uL (ref 150–400)
RBC: 4.17 MIL/uL (ref 3.87–5.11)
RDW: 15.9 % — ABNORMAL HIGH (ref 11.5–15.5)
WBC: 5.5 10*3/uL (ref 4.0–10.5)
nRBC: 0 % (ref 0.0–0.2)

## 2021-10-05 LAB — BASIC METABOLIC PANEL
Anion gap: 9 (ref 5–15)
BUN: 12 mg/dL (ref 8–23)
CO2: 31 mmol/L (ref 22–32)
Calcium: 9.1 mg/dL (ref 8.9–10.3)
Chloride: 90 mmol/L — ABNORMAL LOW (ref 98–111)
Creatinine, Ser: 1.1 mg/dL — ABNORMAL HIGH (ref 0.44–1.00)
GFR, Estimated: 53 mL/min — ABNORMAL LOW (ref >60)
Glucose, Bld: 127 mg/dL — ABNORMAL HIGH (ref 70–99)
Potassium: 3.7 mmol/L (ref 3.5–5.1)
Sodium: 130 mmol/L — ABNORMAL LOW (ref 135–145)

## 2021-10-05 LAB — PROTIME-INR
INR: 1.5 — ABNORMAL HIGH (ref 0.8–1.2)
Prothrombin Time: 18.1 seconds — ABNORMAL HIGH (ref 11.4–15.2)

## 2021-10-05 MED ORDER — LORAZEPAM 2 MG/ML IJ SOLN
0.5000 mg | Freq: Four times a day (QID) | INTRAMUSCULAR | Status: DC | PRN
Start: 2021-10-05 — End: 2021-10-05

## 2021-10-05 MED ORDER — FUROSEMIDE 40 MG PO TABS
40.0000 mg | ORAL_TABLET | Freq: Two times a day (BID) | ORAL | Status: DC
  Administered 2021-10-05 – 2021-10-06 (×3): 40 mg via ORAL
  Filled 2021-10-05 (×3): qty 1

## 2021-10-05 MED ORDER — ONDANSETRON HCL 4 MG/2ML IJ SOLN: 4.0000 mg | Freq: Four times a day (QID) | INTRAMUSCULAR | Status: DC | PRN

## 2021-10-05 MED ORDER — LORAZEPAM 2 MG/ML IJ SOLN
0.5000 mg | Freq: Four times a day (QID) | INTRAMUSCULAR | Status: DC | PRN
Start: 2021-10-05 — End: 2021-10-06

## 2021-10-05 NOTE — Progress Notes (Signed)
Speech Language Pathology Treatment: Dysphagia  Patient Details Name: [name] [name] MRN: [id number] DOB: 1946-08-17 Today's Date: 10/05/2021 Time: 3557-3220 SLP Time Calculation (min) (ACUTE ONLY): 15 min  Assessment / Plan / Recommendation Clinical Impression  Patient seen by SLP for skilled dysphagia tx as patient reportedly was not very cooperative for initial bedside swallow evaluation. Patient was sitting EOB when SLP arrived and RN was in room about to give her medications. Patient reported that she "cant swallow anything" and it is her reflux that is causing this. She told SLP that her MD had taken her off of Protonix at some point but she feels she needs to be on medication again for GERD. Patient swallowed medication tablets whole with sips of plain water and consumed a couple of bites of gelatin with no overt s/s penetration, aspiration or swallowing difficulty. Patient is confused, continuously mistakening SLP for RN, denying that opened bag of chips on table are hers, etc. SLP suspects that patient's swallowing difficulties are primarily related to GERD but patient does not appear to be a good historian and question validity of her statements regarding dysphagia. SLP is not recommending further skilled ST intervention at this time.    HPI HPI: 75 y.o. female presenting 09/30/21 with SOB and urinary retention. Admitted with volume overload and acute on chronic combined CHF. s/p LHC 12/16. 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  Recommendations may be updated based on patient status, additional functional criteria and  insurance authorization.    Recommendations                   Oral Care Recommendations: Oral care BID Follow Up Recommendations: No SLP follow up Assistance recommended at discharge: None SLP Visit Diagnosis: Dysphagia, unspecified (R13.10) Plan: Discharge SLP treatment due to (comment) (no further skilled ST needs)         Angela Nevin, MA, CCC-SLP Speech Therapy

## 2021-10-05 NOTE — Progress Notes (Signed)
Progress Note  Patient Name: [name] [name] Date of Encounter: 10/05/2021  Primary Cardiologist: Tonny Bollman, MD   Subjective   In interim patient took her home diltiazem.  Was planned for discharge.    Had asked me or Dr. Gerri Lins to reach out to her daughter (see prior addendum).  Has since asked that we not reach out to her daughter, as she is has not been by to see her.  Notes that she did not know who I was on prior assessment. Notes that her prior beta blocker challenge was prior her PPM Notes that her swallowing is worse because she has been upset.   Inpatient Medications    Scheduled Meds:  atorvastatin  80 mg Oral QHS   bisoprolol  5 mg Oral Daily   docusate sodium  100 mg Oral BID   enoxaparin (LOVENOX) injection  1 mg/kg Subcutaneous Q12H   feeding supplement  237 mL Oral TID BM   furosemide  40 mg Intravenous BID   levothyroxine  75 mcg Oral QAC breakfast   mouth rinse  15 mL Mouth Rinse BID   multivitamin with minerals  1 tablet Oral Daily   pantoprazole  40 mg Oral Daily   sodium chloride flush  3 mL Intravenous Q12H   sodium chloride flush  3 mL Intravenous Q12H   sodium chloride flush  3 mL Intravenous Q12H   zolpidem  5 mg Oral QHS   Continuous Infusions:  sodium chloride     PRN Meds: sodium chloride, acetaminophen, ALPRAZolam, alum & mag hydroxide-simeth, baclofen, bisacodyl, hydrALAZINE, morphine injection, oxyCODONE, polyethylene glycol, sodium chloride flush, traZODone   Vital Signs    Vitals:   10/04/21 2022 10/05/21 0020 10/05/21 0539 10/05/21 0541  BP: 108/61 127/65  101/73  Pulse: 95 88    Resp: (!) 24 (!) 22  19  Temp: 98.1 F (36.7 C)   97.8 F (36.6 C)  TempSrc: Oral Oral  Oral  SpO2: 100% 96%  91%  Weight:   53.7 kg   Height:        Intake/Output Summary (Last 24 hours) at 10/05/2021 0924 Last data filed at 10/05/2021 0539 Gross per 24 hour  Intake 120 ml  Output 1000 ml  Net -880 ml   Filed Weights   10/03/21 0019  10/04/21 0018 10/05/21 0539  Weight: 55.1 kg 54.8 kg 53.7 kg    Telemetry    AF rate 80s - Personally Reviewed  ECG    No new - Personally Reviewed  Physical Exam   Gen: Mild distress, Thin female Neck: No JVD, at 90 degrees Cardiac: No Rubs or Gallops, Severe aortic crescendo with absent A1 Respiratory: bilateral wheezes GI: Soft, nontender, non-distended  MS: +1 bilateral edema;  moves all extremities Integument: Skin feels warm Neuro:  At time of evaluation, alert and oriented to person/place/time/situation  Psych: Depressed affect, patient feels unwell   Labs    Chemistry Recent Labs  Lab 09/30/21 1302 10/01/21 0400 10/02/21 0241 10/03/21 1104 10/03/21 1115 10/04/21 0237  NA  --  132* 135 135 125* 134*  K  --  2.8* 3.5 3.8 3.5 3.5  CL  --  97* 100  --   --  95*  CO2  --  26 24  --   --  32  GLUCOSE  --  90 111*  --   --  108*  BUN  --  15 14  --   --  11  CREATININE  --  1.14* 1.14*  --   --  1.02*  CALCIUM  --  8.5* 8.5*  --   --  8.7*  PROT 7.0  --   --   --   --   --   ALBUMIN 4.1  --   --   --   --   --   AST 26  --   --   --   --   --   ALT 17  --   --   --   --   --   ALKPHOS 84  --   --   --   --   --   BILITOT 1.3*  --   --   --   --   --   GFRNONAA  --  51* 51*  --   --  58*  ANIONGAP  --  9 11  --   --  7     Hematology Recent Labs  Lab 10/01/21 0400 10/03/21 0230 10/03/21 1104 10/03/21 1115 10/04/21 0237  WBC 4.7 5.2  --   --  5.3  RBC 3.76* 4.00  --   --  3.99  HGB 10.7* 11.3* 12.6 12.2 11.3*  HCT 33.5* 35.1* 37.0 36.0 35.3*  MCV 89.1 87.8  --   --  88.5  MCH 28.5 28.3  --   --  28.3  MCHC 31.9 32.2  --   --  32.0  RDW 16.2* 16.0*  --   --  16.1*  PLT 147* 166  --   --  162    Cardiac EnzymesNo results for input(s): TROPONINI in the last 168 hours. No results for input(s): TROPIPOC in the last 168 hours.   BNP Recent Labs  Lab 09/30/21 1103  BNP 2,815.3*     DDimer  Recent Labs  Lab 10/04/21 1531  DDIMER 1.86*      Radiology    CARDIAC CATHETERIZATION  Result Date: 10/03/2021   Mid LAD lesion is 20% stenosed.   1st Mrg lesion is 40% stenosed.   Prox RCA lesion is 30% stenosed.   Hemodynamic findings consistent with moderate pulmonary hypertension.   There is severe aortic valve stenosis. 1.  Patent coronary arteries with diffuse nonobstructive CAD, continued patency of the mid LAD stent 2.  Known severe aortic stenosis with heavy calcification and restriction of the aortic valve leaflets 3.  Normal fluoroscopic motion of the patient's mechanical mitral prosthesis 4.  Moderate pulmonary hypertension with mean PA pressure 36 mmHg, transpulmonary gradient 13 mmHg, PVR 3.8 Wood units Recommend: Resume warfarin today, start Lovenox tomorrow morning for bridging, tentatively plan TAVR 12/27 we will coordinate hospital admission for bridging into the procedure.  Medical therapy for nonobstructive CAD.  Continue IV diuresis today with elevated filling pressures.     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%, AF on AC, MVR on AC and CAD - planned discharge for 10/05/21 with plans for re-admission and subsequent TAVR S/ PPM - Transitioned to lasix 40 mg PO BID - planned for lasix and 10/11/21 admission for TAVR - She is scheduled for bisoprolol 5 mg this morning; I am unclear if she will take this, but if she will not, we will try digoxin 125 mcg - plan for lovenox, PO lasix, and readmission prior to TAVR if her O2 comes off and she does well - discussed at length with patient,  and discussed with primary team   For questions or updates, please contact [place] Please consult [email or web address] for contact info under Cardiology/STEMI.      Signed, Werner Lean, MD  10/05/2021, 9:24 AM

## 2021-10-05 NOTE — Progress Notes (Signed)
PROGRESS NOTE  [name] [name] [id number] DOB: Jun 30, 1946 DOA: 09/30/2021 PCP: Ronita Hipps, MD  Brief History   [name] [name] is a 75 y.o. 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  She was previously admitted from 11/8-12 with influenza A and CHF exacerbation.  She reports that she did get better after that hospitalization but then got worse again.  She is planned for TAVR and came in today for CT study but was too SOB with orthopnea and marked LE edema and so was sent to the ER for evaluation.  She is not normally on home O2 and current O2 sats are 92-93% on 4L [place] O2.  She does tell the same story multiple times about multiple issues and is mildly tangential.  ED Course: Heart failure exacerbation.  H/o rheumatic heart disease.  Going for TAVR eval today but reported more SOB and oliguria.  Creatinine at baseline.  Diuresing well here.  BNP elevated, troponin likely demand.  On 2L to keep sats >80%.   [facility] were consulted to admit the patient for further evaluation and care.  Cardiology was consulted.   Currently the patient is requiring 2L by [place] to keep saturations greater than 93%. She is being diuresed, and is currently negative  2009.6 cc in terms of fluid balance.   Pt underwent left and right heart catheterization which demonstrated stable CAD. The plan will be to transition the patient to lovenox for discharge to home. She will then return on 12/26 for TAVR on 10/14/2021.  Overnight on 10/03/2021-10/04/2021 the patient became very focused on the fact that her diltiazem CD 240 mg that she had been on at home had been discontinued. It was discontinued, because the patient has CHF with reduced EF of 35-40%. Diltiazem is contraindicated for this reason. The  patient believes that she nearly died due to being placed on a beta blocker in the past, because it caused her to be bradycardic. It is not known in which context this medication was started, or what dose. Cardiology had recommended initiation of bisoprolol to address the patient's increasing heart rate in the setting of atrial fibrillation.   The patient demanded to take her diltiazem CD 240 mg which had not been restarted. Dr. Gasper Sells and I both explained to her why we would not be restarting it, and why this was not in her best interest. The patient had threatened to leave AMA "have a heart attack and sue everyone", if it wasn't restarted.  As I did not want for the patient to simply leave AMA without prescriptions, instructions, or oxygen, I discharged her including home O2 as she was hypoxic without O2 this morning. Then I learned from nursing that against our (mine and Dr. Consepcion Hearing) instructions the patient took the Diltiazem CD 240 mg that was brought in by her daughter. She then told her nurse that she did not want to leave the hospital anymore, but did want to talk to hospice.  Due to the patient's severely anxious and labile mood today, I will not address the statement about hospice today. I have cancelled the discharge. The patient has agreed to not take any other medications that have not been prescribed for her as inpatient. She has received her prn xanax. Frequency of the xanax has been increased.   I  have discussed 10/04/2021 events with Dr. Gasper Sells. He recommends starting bisoprolol in the morning and monitoring the patient on the medication for a day. He has discussed the patient with her cardiologist, Dr. Burt Knack.   Dr. Gasper Sells will try digoxin to control HR as the patient is uncomfortable with bisaprolol. I have again explained to the patient why diltiazem is not in her best interests.  Consultants  Cardiology  Procedures  None  Antibiotics    Anti-infectives (From admission, onward)    None      Subjective  The patient is lying in bed. She is resting comfortably. No new complaints. Objective   Vitals:  Vitals:   10/05/21 0541 10/05/21 1152  BP: 101/73 (!) 126/51  Pulse:  (!) 56  Resp: 19 18  Temp: 97.8 F (36.6 C) 98.4 F (36.9 C)  SpO2: 91% 96%    Exam:  Constitutional:  The patient is awake, alert, and oriented x 3. No acute distress. Respiratory:  No increased work of breathing. No wheezes, rales, or rhonchi No tactile fremitus Cardiovascular:  Regular rate and rhythm No murmurs, ectopy, or gallups. Positive for scattered rales. No lateral PMI. No thrills. Abdomen:  Abdomen is soft, non-tender, non-distended No hernias, masses, or organomegaly Normoactive bowel sounds.  Musculoskeletal:  No cyanosis or clubbing +1 pitting edema on lower extremities bilaterally Skin:  No rashes, lesions, ulcers palpation of skin: no induration or nodules Neurologic:  CN 2-12 intact Sensation all 4 extremities intact Psychiatric:  Mental status Mood, affect anxious Orientation to person, place, time  judgment and insight appear poor I have personally reviewed the following:   Today's Data  Vitals  Lab Data  BMP CBC  Imaging  X-ray abdomen X-ray CXR  Cardiology Data  EKG  Scheduled Meds:  atorvastatin  80 mg Oral QHS   bisoprolol  5 mg Oral Daily   docusate sodium  100 mg Oral BID   enoxaparin (LOVENOX) injection  1 mg/kg Subcutaneous Q12H   feeding supplement  237 mL Oral TID BM   furosemide  40 mg Oral BID   levothyroxine  75 mcg Oral QAC breakfast   mouth rinse  15 mL Mouth Rinse BID   multivitamin with minerals  1 tablet Oral Daily   pantoprazole  40 mg Oral Daily   sodium chloride flush  3 mL Intravenous Q12H   sodium chloride flush  3 mL Intravenous Q12H   sodium chloride flush  3 mL Intravenous Q12H   zolpidem  5 mg Oral QHS   Continuous Infusions:  sodium chloride       Principal Problem:   Acute on chronic combined systolic and diastolic CHF (congestive heart failure) (HCC) Active Problems:   Essential hypertension   PACEMAKER, PERMANENT   Atrial arrhythmia   Hypothyroidism   Severe aortic stenosis   CKD (chronic kidney disease) stage 3, GFR 30-59 ml/min (HCC)   AKI (acute kidney injury) ([place])   DNR (do not resuscitate)   LOS: 5 days   A & P  Acute on chronic combined CHF -Patient with known h/o chronic systolic CHF presenting with worsening SOB and hypoxia, also with edema and orthopnea -CXR consistent with mild pulmonary edema -Significantly elevated BNP compared to prior -With elevated BNP and abnl CXR, acute decompensated CHF seems probable as diagnosis -Will admit, as per the Emergency HF Mortality Risk Grade.  The patient has: severe pulmonary edema requiring new O2 therapy -07/2021 echo with EF 35-40% and grade 3 diastolic dysfunction; will not repeat -  CHF order set utilized -Cardiology consulted -Was given Lasix 40 mg x 1 in ER and will continue with 40 mg IV BID -Continue [place] O2 for now. Requiring 2L by [place] to maintain SaO2 of 93%. Wean as possible.  -Creatinine improving. Monitor creatinine, electrolytes and volume status. -Mildly elevated HS troponin is likely related to demand ischemia; doubt ACS based on symptoms  -Pt underwent LHC today. She has tolerated the procedure well.  - She will be restarted on Lovenox. She will be discharged on Lovenox and then return on 10/13/2021 prior to her procedure on 10/14/2021. - Pt took diltiazem CD 240 mg this am against medical advice from myself and Dr. Izora Ribas. She will not discharge today. We will start bisoprolol in the am and monitor for a day at least prior to discharge.  Tachypnea/Anxiety: Pt is on 1 mg/kg lovenox. Doubt PE, but will check D dimer.  Valvular heart disease -She is s/p MVR, on Coumadin; coumadin is held. Continue full dose Lovenox after LHC. -She is undergoing  evaluation for TAVR on 10/14/2021   CAD -s/p stent -No current obvious c/o chest pain   Afib -Has pacemaker -Rate increased today. -Patient has taken diltiazem CD 240 mg against medical advice. She agrees not to do this again. -The patient will be started on Digoxin to control heart rate as a means to avoid her discomfort from Bisoprolol. She will be monitored while taking this medication prior to discharge. -She is on 1 mg/kg Lovenox and will continue on this until she presents again to [facility] hospital for TAVR.   HTN -Hold candesartan due to marginal BP and mild AKI -Continue Diltiazem -Will also add prn hydralazine   HLD -Continue Lipitor   AKI on Stage 3a CKD -Slightly worse than baseline, will trend and attempt to avoid nephrotoxic meds -Baseline creatinine appears to be 0.94. Creatinine is 1.02 today.   Hypothyroidism -Continue Synthroid   Psych -Continue Xanax, Ambien -Patient appeared to have mild cognitive impairment at the time of my evaluation. She appears to be having a panic attack. She has had xanax 1.0 mg this morning. She is still anxious. -Suggest outpatient evaluation   Note: This patient has been tested and is negative for the novel coronavirus COVID-19. She has NOT been vaccinated against COVID-19.    I have seen and examined this patient myself. I have spent 34 minutes in her evaluation and care.   Level of care: Telemetry Cardiac  DVT prophylaxis: Coumadin Code Status:  DNR - confirmed with patient Family Communication: None present; she declined to have me call family at the time of admission Disposition Plan:  The patient is from: home             Anticipated d/c is to: home, possibly with [facility] services             Anticipated d/c date will depend on clinical response to treatment, likely 2-4 days             Patient is currently: acutely ill  Mel Tadros, DO [facility] Direct contact: see [email or web address]  7PM-7AM contact night coverage as  above 10/05/2021, 1:48 PM  LOS: 1 day

## 2021-10-06 ENCOUNTER — Other Ambulatory Visit (HOSPITAL_COMMUNITY): Payer: Self-pay

## 2021-10-06 DIAGNOSIS — I5043 Acute on chronic combined systolic (congestive) and diastolic (congestive) heart failure: Secondary | ICD-10-CM | POA: Diagnosis not present

## 2021-10-06 DIAGNOSIS — I509 Heart failure, unspecified: Secondary | ICD-10-CM

## 2021-10-06 LAB — CBC
HCT: 33.7 % — ABNORMAL LOW (ref 36.0–46.0)
Hemoglobin: 11 g/dL — ABNORMAL LOW (ref 12.0–15.0)
MCH: 28.4 pg (ref 26.0–34.0)
MCHC: 32.6 g/dL (ref 30.0–36.0)
MCV: 86.9 fL (ref 80.0–100.0)
Platelets: 145 10*3/uL — ABNORMAL LOW (ref 150–400)
RBC: 3.88 MIL/uL (ref 3.87–5.11)
RDW: 15.8 % — ABNORMAL HIGH (ref 11.5–15.5)
WBC: 3.7 10*3/uL — ABNORMAL LOW (ref 4.0–10.5)
nRBC: 0 % (ref 0.0–0.2)

## 2021-10-06 LAB — PROTIME-INR
INR: 1.5 — ABNORMAL HIGH (ref 0.8–1.2)
Prothrombin Time: 18.5 seconds — ABNORMAL HIGH (ref 11.4–15.2)

## 2021-10-06 MED ORDER — OXYCODONE HCL 5 MG PO TABS
2.5000 mg | ORAL_TABLET | ORAL | 0 refills | Status: DC | PRN
  Filled 2021-10-06: qty 12, 2d supply, fill #0

## 2021-10-06 MED ORDER — PANTOPRAZOLE SODIUM 40 MG PO TBEC
40.0000 mg | DELAYED_RELEASE_TABLET | Freq: Every day | ORAL | 0 refills | Status: DC
  Filled 2021-10-06: qty 30, 30d supply, fill #0

## 2021-10-06 MED ORDER — OXYCODONE HCL 5 MG PO TABS
2.5000 mg | ORAL_TABLET | ORAL | Status: DC | PRN
Start: 2021-10-06 — End: 2021-10-06
  Administered 2021-10-06: 04:00:00 5 mg via ORAL

## 2021-10-06 MED ORDER — DOCUSATE SODIUM 100 MG PO CAPS
100.0000 mg | ORAL_CAPSULE | Freq: Two times a day (BID) | ORAL | 0 refills | Status: DC
  Filled 2021-10-06: qty 10, 5d supply, fill #0

## 2021-10-06 MED ORDER — DIGOXIN 125 MCG PO TABS
125.0000 ug | ORAL_TABLET | Freq: Every day | ORAL | 11 refills | Status: DC
  Filled 2021-10-06: qty 30, 30d supply, fill #0

## 2021-10-06 MED ORDER — ENOXAPARIN SODIUM 60 MG/0.6ML IJ SOSY
60.0000 mg | PREFILLED_SYRINGE | Freq: Two times a day (BID) | INTRAMUSCULAR | 0 refills | Status: DC
  Filled 2021-10-06: qty 24, 20d supply, fill #0

## 2021-10-06 NOTE — Care Management Important Message (Signed)
Important Message  Patient Details  Name: [name] [name] MRN: [id number] Date of Birth: June 18, 1946   Medicare Important Message Given:  Yes     Renie Ora 10/06/2021, 9:45 AM

## 2021-10-06 NOTE — Progress Notes (Addendum)
ANTICOAGULATION CONSULT NOTE - Follow Up Consult  Pharmacy Consult for SQ Lovenox   (Warfarin on Hold) Indication:  Mechanical Valve  Allergies  Allergen Reactions   Atenolol Other (See Comments)    HR and pulse dropped   Metoprolol Succinate [Metoprolol] Other (See Comments)    HR and pulse dropped   Baclofen Anxiety and Other (See Comments)    Makes the patient feel anxious the day after taking this   Latex Rash    Patient Measurements: Vital Signs: Temp: 97.9 F (36.6 C) (12/19 0855) Temp Source: Oral (12/19 0855) BP: 123/63 (12/19 0855) Pulse Rate: 60 (12/19 0855)  Labs: Recent Labs    10/04/21 0237 10/05/21 1448 10/06/21 0126  HGB 11.3* 11.9* 11.0*  HCT 35.3* 36.3 33.7*  PLT 162 176 145*  LABPROT 19.5* 18.1* 18.5*  INR 1.7* 1.5* 1.5*  CREATININE 1.02* 1.10*  --      Estimated Creatinine Clearance: 32.9 mL/min (A) (by C-G formula based on SCr of 1.1 mg/dL (H)).   Medical History: Past Medical History:  Diagnosis Date   Anxiety    Aortic aneurysm [facility])    Atrial arrhythmia    With prior history of atrial flutter ablation   Bradycardia    s/p pacemaker implantation   CONGESTIVE HEART FAILURE 07/31/2010   Qualifier: Diagnosis of  By: Caryl Comes, MD, Remus Blake    GERD (gastroesophageal reflux disease)    Hypertension    Hypothyroidism    Long Q-T syndrome    Myocardial infarct ([place]) 11/20/2011   Stent placement for 100% blockage of LAD   PACEMAKER, 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   Qualifier: Diagnosis of  By: Caryl Comes, MD, Remus Blake    Rheumatic heart disease    With mitral valve replacement, tricuspid regurgitation moderate to severe, significant left atrial enlargment   TRICUSPID REGURGITATION 12/23/2010   Qualifier: Diagnosis of  By: Caryl Comes, MD, Remus Blake    Unspecified essential hypertension 07/31/2010   Qualifier: Diagnosis of  By: Caryl Comes, MD, Remus Blake     Assessment: 75 yr old woman with hx of chronic systolic CHF,  severe AS undergoing evaluation for TAVR; 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 Patient presented with urinary retention, SOB, and weakness. Pharmacy was initially  consulted for heparin IV dosing for mechanical mitral valve whild PTA warfarin on  hold>> changed to SQ Lovenox on 12/17 while warfarin on hold.   PTA warfarin regimen:  7.5 mg daily (last dose 12/11; per patient, INR was > 5 at that time).   Started on enoxaparin (lovenox) 1 mg/kg q12h 12/17 = 55 mg sq Q12h ,until TAVR 12/27. -CBC ok stable, no bleeding reported.  SCr ~1 stable, CrCl 32.9 ml/min.  >MD noted patient wants to go home today 10/06/21. Discharge orders are in for Lovenox 60mg  q12h with last dose preop on 12/26 as plan is for TAVR re-admission 12/2.  Dose is appropriate for weight and renal function CrCl >30 ml/min.    Goal of Therapy:  INR 2.5-3.5 for warfarin - Warfarin remains on hold Monitor platelets by anticoagulation protocol: Yes  Plan:  -Continue Lovenox 1mg /kg SQ 12 hours = 55 mg SQ q12hr.  (60mg  sq q12hr dose ok discharge is okay) Monitor for signs / symptoms of bleeding.   Thank you for allowing pharmacy to be part of this patients care team.  Nicole Cella, [place] Pharmacist [phone] 10/06/2021 11:00 AM **Pharmacist phone directory can now be  found on [email or web address] ([email or web address]).  Listed under [facility].

## 2021-10-06 NOTE — Discharge Summary (Signed)
Physician Discharge Summary  [name] [name] [id number] DOB: [birth year] DOA: 09/30/2021  PCP: [name] Hipps, MD  Admit date: 09/30/2021 Discharge date: 10/06/2021  Recommendations for Outpatient Follow-up:  Discharge to home on 1 mg/kg lovenox Follow up with [name] [name] for Cardiology on Monday. Direct admission for TAVR as per Structural heart Team 10/11/2021. Pager: [phone] Phone: [phone] Discharge to home on 2L home O2 by nasal cannula. Continue Lovenox at home twice daily until you return to hospital.   [name] [name] Follow up.   Why: [place]. FOr any concerns call [name] [name] at [place] information: [address] [place] [address] [phone]         [name] Hipps, MD. Go on 10/14/2021.   Specialty: Family Medicine Why: @1 :50pm Contact information: [place]         [name] Mocha, MD .   Specialty: Cardiology Contact information: [id number] [place] [address] [phone]                  Discharge Diagnoses: Principal diagnosis is #1  Acute on chronic combined CHF Valvular Heart Disease CAD Atrial fibrillation Hypertension Hyperlipidemia AKI on Stage 3a CKD Hypothyroidism GAD/Panic attacks  Discharge Condition: Fair  Disposition: Home with home health and O2  Diet recommendation: Heart healthy  Filed Weights   10/04/21 0018 10/05/21 0539 10/06/21 0300  Weight: 54.8 kg 53.7 kg 55 kg    History of present illness:   [name] [name] is a 75 y.o. 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  She was previously admitted from 11/8-12 with influenza A and CHF  exacerbation.  She reports that she did get better after that hospitalization but then got worse again.  She is planned for TAVR and came in today for CT study but was too SOB with orthopnea and marked LE edema and so was sent to the ER for evaluation.  She is not normally on home O2 and current O2 sats are 92-93% on 4L [place] O2.  She does tell the same story multiple times about multiple issues and is mildly tangential.   ED Course: Heart failure exacerbation.  H/o rheumatic heart disease.  Going for TAVR eval today but reported more SOB and oliguria.  Creatinine at baseline.  Diuresing well here.  BNP elevated, troponin likely demand.  On 2L to keep sats >80%.   Hospital Course:  [facility] hospitalists were consulted to admit the patient for further evaluation and care.   Cardiology was consulted.    Currently the patient is requiring 2L by [place] to keep saturations greater than 93%. She is being diuresed, and is currently negative  2009.6 cc in terms of fluid balance.    Pt underwent left and right heart catheterization which demonstrated stable CAD. The plan will be to transition the patient to lovenox for discharge to home. She will then return on 12/26 for TAVR on 10/14/2021.   Overnight on 10/03/2021-10/04/2021 the patient became very focused on the fact that her diltiazem CD 240 mg that she had been on at home had been discontinued. It was discontinued, because the patient has CHF with reduced EF of 35-40%. Diltiazem is contraindicated for this reason. The patient  believes that she nearly died due to being placed on a beta blocker in the past, because it caused her to be bradycardic. It is not known in which context this medication was started, or what dose. Cardiology had recommended initiation of bisoprolol to address the patient's increasing heart rate in the setting of atrial fibrillation.    The patient demanded to take her diltiazem CD 240 mg which had not been restarted. Dr. Gasper [name] and I  both explained to her why we would not be restarting it, and why this was not in her best interest. The patient had threatened to leave AMA "have a heart attack and sue everyone", if it wasn't restarted.   As I did not want for the patient to simply leave AMA without prescriptions, instructions, or oxygen, I discharged her including home O2 as she was hypoxic without O2 this morning. Then I learned from nursing that against our (mine and Dr. Consepcion [name]) instructions the patient took the Diltiazem CD 240 mg that was brought in by her daughter. She then told her nurse that she did not want to leave the hospital anymore, but did want to talk to hospice.   Due to the patient's severely anxious and labile mood today, I will not address the statement about hospice today. I have cancelled the discharge. The patient has agreed to not take any other medications that have not been prescribed for her as inpatient. She has received her prn xanax. Frequency of the xanax has been increased.    I have discussed 10/04/2021 events with Dr. Gasper [name]. He recommends starting bisoprolol in the morning and monitoring the patient on the medication for a day. He has discussed the patient with her cardiologist, [name] [name].    Dr. Gasper [name] will try digoxin to control HR as the patient is uncomfortable with bisaprolol. I have again explained to the patient why diltiazem is not in her best interests.  The patient will be discharged to home today.  Today's assessment: S: The patient is resting comfortably. No new complaints. Anxious to go home. O: Vitals:  Vitals:   10/06/21 0855 10/06/21 1123  BP: 123/63 (!) 96/42  Pulse: 60 (!) 59  Resp:    Temp: 97.9 F (36.6 C) (!) 97.4 F (36.3 C)  SpO2: 99% 100%    Exam:   Constitutional:  The patient is awake, alert, and oriented x 3. No acute distress. Respiratory:  No increased work of breathing. No wheezes, rales, or rhonchi No tactile  fremitus Cardiovascular:  Regular rate and rhythm No murmurs, ectopy, or gallups. Positive for scattered rales. No lateral PMI. No thrills. Abdomen:  Abdomen is soft, non-tender, non-distended No hernias, masses, or organomegaly Normoactive bowel sounds.  Musculoskeletal:  No cyanosis or clubbing +1 pitting edema on lower extremities bilaterally Skin:  No rashes, lesions, ulcers palpation of skin: no induration or nodules Neurologic:  CN 2-12 intact Sensation all 4 extremities intact Psychiatric:  Mental status Mood, affect anxious Orientation to person, place, time  judgment and insight appear poor   Discharge Instructions  Discharge Instructions     (HEART FAILURE PATIENTS) Call MD:  Anytime you have any of the following symptoms: 1) 3 pound weight gain in 24 hours or 5 pounds in 1 week 2) shortness of breath, with or without a dry hacking cough 3) swelling in the hands, feet or stomach 4) if you have to sleep on extra pillows at night in order to breathe.   Complete by: As directed  Activity as tolerated - No restrictions   Complete by: As directed    Call MD for:  difficulty breathing, headache or visual disturbances   Complete by: As directed    Call MD for:  persistant nausea and vomiting   Complete by: As directed    Call MD for:  severe uncontrolled pain   Complete by: As directed    Call MD for:  severe uncontrolled pain   Complete by: As directed    Diet - low sodium heart healthy   Complete by: As directed    Discharge instructions   Complete by: As directed    Discharge to home on 1 mg/kg lovenox Follow up with [name] [name] for Cardiology on Monday. Direct admission for TAVR as per Structural heart Team 10/11/2021. Pager: [phone] Phone: [phone]   Discharge instructions   Complete by: As directed    Discharge to home on 2L home O2 by nasal cannula. Continue Lovenox at home twice daily until you return to hospital. Follow up with [name] [name] on  Monday am. Return to [facility] for Direct Admit as per instructions of Structural Heart Team. Pager: [phone] Phone: [phone]   Heart Failure patients record your daily weight using the same scale at the same time of day   Complete by: As directed    Increase activity slowly   Complete by: As directed    Increase activity slowly   Complete by: As directed    STOP any activity that causes chest pain, shortness of breath, dizziness, sweating, or exessive weakness   Complete by: As directed       Allergies as of 10/06/2021       Reactions   Atenolol Other (See Comments)   HR and pulse dropped   Metoprolol Succinate [metoprolol] Other (See Comments)   HR and pulse dropped   Baclofen Anxiety, Other (See Comments)   Makes the patient feel anxious the day after taking this   Latex Rash        Medication List     STOP taking these medications    candesartan 8 MG tablet Commonly known as: ATACAND   Dilt-XR 240 MG 24 hr capsule Generic drug: diltiazem   warfarin 5 MG tablet Commonly known as: COUMADIN       TAKE these medications    albuterol 108 (90 Base) MCG/ACT inhaler Commonly known as: VENTOLIN HFA Inhale 2 puffs into the lungs every 6 (six) hours as needed for shortness of breath.   ALPRAZolam 1 MG tablet Commonly known as: XANAX Take 0.5-1 mg by mouth 2 (two) times daily as needed for anxiety.   atorvastatin 80 MG tablet Commonly known as: LIPITOR Take 80 mg by mouth at bedtime.   baclofen 20 MG tablet Commonly known as: LIORESAL Take 10 mg by mouth every 8 (eight) hours as needed for muscle spasms.   digoxin 0.125 MG tablet Commonly known as: Lanoxin Take 1 tablet (125 mcg total) by mouth daily.   docusate sodium 100 MG capsule Commonly known as: COLACE Take 1 capsule (100 mg total) by mouth 2 (two) times daily.   enoxaparin 60 MG/0.6ML injection Commonly known as: LOVENOX Inject 1 syringe (60 mg total) into the skin 2 (two) times daily.  Begin 10/04/21. Last dose pre-operatively is morning of 10/13/21.   furosemide 20 MG tablet Commonly known as: LASIX Take 40 mg by mouth 2 (two) times daily.   Klor-Con M20 20 MEQ tablet Generic drug: potassium chloride SA Take 40 mEq  by mouth See admin instructions. Crush and mix 40 mEq with applesauce and eat once a day   lactose free nutrition Liqd Take 237 mLs by mouth 2 (two) times daily.   levothyroxine 75 MCG tablet Commonly known as: SYNTHROID Take 75 mcg by mouth daily before breakfast.   multivitamin with minerals Tabs tablet Take 1 tablet by mouth daily.   nitroGLYCERIN 0.4 MG SL tablet Commonly known as: NITROSTAT Place 0.4 mg under the tongue every 5 (five) minutes as needed for chest pain.   oxyCODONE 5 MG immediate release tablet Commonly known as: Oxy IR/ROXICODONE Take 0.5-1 tablets (2.5-5 mg total) by mouth every 4 (four) hours as needed for moderate pain.   pantoprazole 40 MG tablet Commonly known as: PROTONIX Take 1 tablet (40 mg total) by mouth daily.   zolpidem 10 MG tablet Commonly known as: AMBIEN Take 5-10 mg by mouth at bedtime.       Allergies  Allergen Reactions   Atenolol Other (See Comments)    HR and pulse dropped   Metoprolol Succinate [Metoprolol] Other (See Comments)    HR and pulse dropped   Baclofen Anxiety and Other (See Comments)    Makes the patient feel anxious the day after taking this   Latex Rash    The results of significant diagnostics from this hospitalization (including imaging, microbiology, ancillary and laboratory) are listed below for reference.    Significant Diagnostic Studies: DG Chest 2 View  Result Date: 09/30/2021 CLINICAL DATA:  Shortness of breath EXAM: CHEST - 2 VIEW COMPARISON:  08/26/2021 FINDINGS: Cardiomegaly status post median sternotomy with mitral annulus prosthesis. Left chest multi lead pacer. Small, layering bilateral pleural effusions and associated atelectasis or consolidation. Exaggerated  thoracic kyphosis. Disc degenerative disease of the thoracic spine. IMPRESSION: 1.  Cardiomegaly. 2. Small, layering bilateral pleural effusions and associated atelectasis or consolidation. Electronically Signed   By: Jearld Lesch M.D.   On: 09/30/2021 11:27   DG Abdomen 1 View  Result Date: 09/30/2021 CLINICAL DATA:  Shortness of breath, LEFT lower quadrant abdominal pain, unable to urinate since yesterday, constipation EXAM: ABDOMEN - 1 VIEW COMPARISON:  02/27/2014 FINDINGS: Nonobstructive bowel gas pattern. No bowel dilatation or bowel wall thickening. Bones demineralized with degenerative facet disease changes lower lumbar spine. Surgical clips RIGHT upper quadrant. No urinary tract calcifications. IMPRESSION: No acute abnormalities. Electronically Signed   By: Ulyses Southward M.D.   On: 09/30/2021 11:29   CARDIAC CATHETERIZATION  Result Date: 10/03/2021   Mid LAD lesion is 20% stenosed.   1st Mrg lesion is 40% stenosed.   Prox RCA lesion is 30% stenosed.   Hemodynamic findings consistent with moderate pulmonary hypertension.   There is severe aortic valve stenosis. 1.  Patent coronary arteries with diffuse nonobstructive CAD, continued patency of the mid LAD stent 2.  Known severe aortic stenosis with heavy calcification and restriction of the aortic valve leaflets 3.  Normal fluoroscopic motion of the patient's mechanical mitral prosthesis 4.  Moderate pulmonary hypertension with mean PA pressure 36 mmHg, transpulmonary gradient 13 mmHg, PVR 3.8 Wood units Recommend: Resume warfarin today, start Lovenox tomorrow morning for bridging, tentatively plan TAVR 12/27 we will coordinate hospital admission for bridging into the procedure.  Medical therapy for nonobstructive CAD.  Continue IV diuresis today with elevated filling pressures.   CT CORONARY MORPH W/CTA COR W/SCORE W/CA W/CM &/OR WO/CM  Addendum Date: 10/02/2021   ADDENDUM REPORT: 10/02/2021 09:41 CLINICAL DATA:  Aortic stenosis EXAM: Cardiac  TAVR CT TECHNIQUE:  The patient was scanned on a Siemens Force [id number] slice scanner. A 120 kV retrospective scan was triggered in the descending thoracic aorta at 111 HU's. Gantry rotation speed was 270 msecs and collimation was .9 mm. No beta blockade or nitro were given. The 3D data set was reconstructed in 5% intervals of the R-R cycle. Systolic and diastolic phases were analyzed on a dedicated work station using MPR, MIP and VRT modes. The patient received 80 cc of contrast. FINDINGS: Aortic Valve: Tri leaflet calcified with restricted leaflet motion Calcium score > 5000 Aorta: Heavily calcified mildly dilated ascending thoracic aorta severe calcific atherosclerosis Sinotubular Junction: 27 mm Ascending Thoracic Aorta: 39 mm Descending Thoracic Aorta: 24 mm Sinus of Valsalva Measurements: Non-coronary: 34.8 mm Right - coronary: 33.6 mm Left - coronary: 32.4 mm Coronary Artery Height above Annulus: Left Main: 11.7 mm above annulus Right Coronary: 11.1 mm above annulus Virtual Basal Annulus Measurements: Maximum/Minimum Diameter: 27 mm x 19.4 mm Perimeter: 77.6 mm Area: 441 mm2 30% phase Coronary Arteries: Sufficient height above annulus for deployment Optimum Fluoroscopic Angle for Delivery: LAO 12 Caudal 12 degrees IMPRESSION: 1. Calcified tri-leaflet AV with score >5,000 2.  Annular area of 441 mm2 suitable for a 26 mm Sapien 3 valve 3. Normal appearing bileaflet mechanical MVR with no pannus and good opening /closing angles 4.  Mixing artifact in LAA no delayed images done 5.  Coronary arteries sufficient height above annulus for deployment 6. Optimum angiographic angle for deployment LAO 12 Caudal 12 degrees 7.  Patient appearing stent in LAD 8.  Pacing wires in RA/RV 9.  Dilated main PA 4.1 cm suggesting elevated PA systolic pressures [place] Rouge Electronically Signed   By: Jenkins Rouge M.D.   On: 10/02/2021 09:41   Result Date: 10/02/2021 EXAM: OVER-READ INTERPRETATION  CT CHEST The following report is an  over-read performed by radiologist Dr. Vinnie Langton of [facility], [place] on 10/02/2021. This over-read does not include interpretation of cardiac or coronary anatomy or pathology. The coronary calcium score/coronary CTA interpretation by the cardiologist is attached. COMPARISON:  Chest CT 07/22/2021. FINDINGS: Extracardiac findings will be described separately under dictation for contemporaneously obtained CTA chest, abdomen and pelvis. IMPRESSION: Please see separate dictation for contemporaneously obtained CTA chest, abdomen and pelvis dated 10/02/2021 for full description of relevant extracardiac findings. Electronically Signed: By: Vinnie Langton M.D. On: 10/02/2021 09:14   CT ANGIO CHEST AORTA W/CM & OR WO/CM  Result Date: 10/02/2021 CLINICAL DATA:  75 year old female with history of severe aortic stenosis. Preprocedural study prior to potential transcatheter aortic valve replacement (TAVR) procedure. EXAM: CT ANGIOGRAPHY CHEST, ABDOMEN AND PELVIS TECHNIQUE: Multidetector CT imaging through the chest, abdomen and pelvis was performed using the standard protocol during bolus administration of intravenous contrast. Multiplanar reconstructed images and MIPs were obtained and reviewed to evaluate the vascular anatomy. CONTRAST:  147mL OMNIPAQUE IOHEXOL 350 MG/ML SOLN COMPARISON:  None. FINDINGS: CTA CHEST FINDINGS Cardiovascular: Heart size is severely enlarged. There is no significant pericardial fluid, thickening or pericardial calcification. There is aortic atherosclerosis, as well as atherosclerosis of the great vessels of the mediastinum and the coronary arteries, including calcified atherosclerotic plaque in the left main, left anterior descending, left circumflex and right coronary arteries. Myocardial thinning and subendocardial hypoattenuation throughout the mid to distal LAD territory, likely sequela of prior LAD territory myocardial infarction(s). This is associated with some mild  aneurysmal dilatation of the left ventricular apex. Severe dilatation of the pulmonic trunk (4.3 cm in diameter), concerning for  pulmonary arterial hypertension. Severe thickening calcification of the aortic valve. 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 Calcifications of the mitral annulus. Left-sided pacemaker device in place with lead tips terminating in the right atrial appendage and right ventricular apex. Mediastinum/Lymph Nodes: No pathologically enlarged mediastinal or hilar lymph nodes. Hilar esophagus is unremarkable in appearance. No axillary lymphadenopathy. Lungs/Pleura: Small bilateral pleural effusions (right greater than left) lying dependently. Scattered areas of subsegmental atelectasis are noted in the lower lungs, along with near complete atelectasis of the right middle lobe. There is a background of mild diffuse ground-glass attenuation and interlobular septal thickening throughout the lungs bilaterally, suggesting the presence of mild interstitial pulmonary edema. Musculoskeletal/Soft Tissues: Median sternotomy wires. There are no aggressive appearing lytic or blastic lesions noted in the visualized portions of the skeleton. CTA ABDOMEN AND PELVIS FINDINGS Hepatobiliary: Heterogeneous attenuation throughout the hepatic parenchyma, with large areas of low attenuation most evident in the right lobe of the liver, favored to reflect a background of heterogeneous hepatic steatosis. No definite focal cystic or solid hepatic lesions. No intra or extrahepatic biliary ductal dilatation. Status post cholecystectomy. Pancreas: No pancreatic mass. No pancreatic ductal dilatation. No pancreatic or peripancreatic fluid collections or inflammatory changes. Spleen: Small splenule inferior to the spleen. Otherwise, unremarkable. Adrenals/Urinary Tract: Mild multifocal cortical thinning in both kidneys. Subcentimeter low-attenuation lesion in the upper pole of the left kidney, too  small to characterize, but statistically likely to represent a tiny cyst. Bilateral adrenal glands are normal in appearance. No hydroureteronephrosis. Urinary bladder is normal in appearance. Stomach/Bowel: The appearance of the stomach is normal. There is no pathologic dilatation of small bowel or colon. The appendix is not confidently identified and may be surgically absent. Regardless, there are no inflammatory changes noted adjacent to the cecum to suggest the presence of an acute appendicitis at this time. Vascular/Lymphatic: Aortic atherosclerosis, with vascular findings and measurements pertinent to potential TAVR procedure, as detailed below. No aneurysm or dissection noted in the abdominal or pelvic vasculature. No lymphadenopathy identified in the abdomen or pelvis. Reproductive: Uterus and ovaries are atrophic. Other: No significant volume of ascites.  No pneumoperitoneum. Musculoskeletal: There are no aggressive appearing lytic or blastic lesions noted in the visualized portions of the skeleton. VASCULAR MEASUREMENTS PERTINENT TO TAVR: AORTA: Minimal Aortic Diameter-13 x 14 mm Severity of Aortic Calcification-moderate to severe RIGHT PELVIS: Right Common Iliac Artery - Minimal Diameter-7.7 x 7.1 mm Tortuosity-mild Calcification-moderate Right External Iliac Artery - Minimal Diameter-7.1 x 7.1 mm Tortuosity-mild-to-moderate Calcification-none Right Common Femoral Artery - Minimal Diameter-6.5 x 6.3 mm Tortuosity-mild Calcification-none LEFT PELVIS: Left Common Iliac Artery - Minimal Diameter-8.2 x 8.7 mm Tortuosity-mild Calcification-moderate Left External Iliac Artery - Minimal Diameter-6.3 x 7.2 mm Tortuosity-mild Calcification-none Left Common Femoral Artery - Minimal Diameter-6.8 x 6.8 mm Tortuosity-mild Calcification-none Review of the MIP images confirms the above findings. IMPRESSION: 1. Vascular findings and measurements pertinent to potential TAVR procedure, as detailed above. 2. Severe  thickening calcification of the aortic valve, compatible with reported clinical history of severe aortic stenosis. 3. Cardiomegaly with findings suggestive of congestive heart failure, including evidence of mild interstitial pulmonary edema and small bilateral pleural effusions, as detailed above. 4. Aortic atherosclerosis, in addition to left main and 3 vessel coronary artery disease. There is also evidence of prior LAD territory myocardial infarction(s), as above. 5. Severe dilatation of the pulmonic trunk (4.3 cm in diameter), concerning for pulmonary arterial hypertension. 6. Heterogeneous hepatic steatosis. 7. Additional incidental findings, as above. Electronically Signed  By: Vinnie Langton M.D.   On: 10/02/2021 10:12   CT ANGIO ABDOMEN PELVIS  W &/OR WO CONTRAST  Result Date: 10/02/2021 CLINICAL DATA:  75 year old female with history of severe aortic stenosis. Preprocedural study prior to potential transcatheter aortic valve replacement (TAVR) procedure. EXAM: CT ANGIOGRAPHY CHEST, ABDOMEN AND PELVIS TECHNIQUE: Multidetector CT imaging through the chest, abdomen and pelvis was performed using the standard protocol during bolus administration of intravenous contrast. Multiplanar reconstructed images and MIPs were obtained and reviewed to evaluate the vascular anatomy. CONTRAST:  188mL OMNIPAQUE IOHEXOL 350 MG/ML SOLN COMPARISON:  None. FINDINGS: CTA CHEST FINDINGS Cardiovascular: Heart size is severely enlarged. There is no significant pericardial fluid, thickening or pericardial calcification. There is aortic atherosclerosis, as well as atherosclerosis of the great vessels of the mediastinum and the coronary arteries, including calcified atherosclerotic plaque in the left main, left anterior descending, left circumflex and right coronary arteries. Myocardial thinning and subendocardial hypoattenuation throughout the mid to distal LAD territory, likely sequela of prior LAD territory myocardial  infarction(s). This is associated with some mild aneurysmal dilatation of the left ventricular apex. Severe dilatation of the pulmonic trunk (4.3 cm in diameter), concerning for pulmonary arterial hypertension. Severe thickening calcification of the aortic valve. 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 Calcifications of the mitral annulus. Left-sided pacemaker device in place with lead tips terminating in the right atrial appendage and right ventricular apex. Mediastinum/Lymph Nodes: No pathologically enlarged mediastinal or hilar lymph nodes. Hilar esophagus is unremarkable in appearance. No axillary lymphadenopathy. Lungs/Pleura: Small bilateral pleural effusions (right greater than left) lying dependently. Scattered areas of subsegmental atelectasis are noted in the lower lungs, along with near complete atelectasis of the right middle lobe. There is a background of mild diffuse ground-glass attenuation and interlobular septal thickening throughout the lungs bilaterally, suggesting the presence of mild interstitial pulmonary edema. Musculoskeletal/Soft Tissues: Median sternotomy wires. There are no aggressive appearing lytic or blastic lesions noted in the visualized portions of the skeleton. CTA ABDOMEN AND PELVIS FINDINGS Hepatobiliary: Heterogeneous attenuation throughout the hepatic parenchyma, with large areas of low attenuation most evident in the right lobe of the liver, favored to reflect a background of heterogeneous hepatic steatosis. No definite focal cystic or solid hepatic lesions. No intra or extrahepatic biliary ductal dilatation. Status post cholecystectomy. Pancreas: No pancreatic mass. No pancreatic ductal dilatation. No pancreatic or peripancreatic fluid collections or inflammatory changes. Spleen: Small splenule inferior to the spleen. Otherwise, unremarkable. Adrenals/Urinary Tract: Mild multifocal cortical thinning in both kidneys. Subcentimeter low-attenuation  lesion in the upper pole of the left kidney, too small to characterize, but statistically likely to represent a tiny cyst. Bilateral adrenal glands are normal in appearance. No hydroureteronephrosis. Urinary bladder is normal in appearance. Stomach/Bowel: The appearance of the stomach is normal. There is no pathologic dilatation of small bowel or colon. The appendix is not confidently identified and may be surgically absent. Regardless, there are no inflammatory changes noted adjacent to the cecum to suggest the presence of an acute appendicitis at this time. Vascular/Lymphatic: Aortic atherosclerosis, with vascular findings and measurements pertinent to potential TAVR procedure, as detailed below. No aneurysm or dissection noted in the abdominal or pelvic vasculature. No lymphadenopathy identified in the abdomen or pelvis. Reproductive: Uterus and ovaries are atrophic. Other: No significant volume of ascites.  No pneumoperitoneum. Musculoskeletal: There are no aggressive appearing lytic or blastic lesions noted in the visualized portions of the skeleton. VASCULAR MEASUREMENTS PERTINENT TO TAVR: AORTA: Minimal Aortic Diameter-13 x  14 mm Severity of Aortic Calcification-moderate to severe RIGHT PELVIS: Right Common Iliac Artery - Minimal Diameter-7.7 x 7.1 mm Tortuosity-mild Calcification-moderate Right External Iliac Artery - Minimal Diameter-7.1 x 7.1 mm Tortuosity-mild-to-moderate Calcification-none Right Common Femoral Artery - Minimal Diameter-6.5 x 6.3 mm Tortuosity-mild Calcification-none LEFT PELVIS: Left Common Iliac Artery - Minimal Diameter-8.2 x 8.7 mm Tortuosity-mild Calcification-moderate Left External Iliac Artery - Minimal Diameter-6.3 x 7.2 mm Tortuosity-mild Calcification-none Left Common Femoral Artery - Minimal Diameter-6.8 x 6.8 mm Tortuosity-mild Calcification-none Review of the MIP images confirms the above findings. IMPRESSION: 1. Vascular findings and measurements pertinent to potential TAVR  procedure, as detailed above. 2. Severe thickening calcification of the aortic valve, compatible with reported clinical history of severe aortic stenosis. 3. Cardiomegaly with findings suggestive of congestive heart failure, including evidence of mild interstitial pulmonary edema and small bilateral pleural effusions, as detailed above. 4. Aortic atherosclerosis, in addition to left main and 3 vessel coronary artery disease. There is also evidence of prior LAD territory myocardial infarction(s), as above. 5. Severe dilatation of the pulmonic trunk (4.3 cm in diameter), concerning for pulmonary arterial hypertension. 6. Heterogeneous hepatic steatosis. 7. Additional incidental findings, as above. Electronically Signed   By: Vinnie Langton M.D.   On: 10/02/2021 10:12    Microbiology: Recent Results (from the past 240 hour(s))  Resp Panel by RT-PCR (Flu A&B, Covid) Nasopharyngeal Swab     Status: None   Collection Time: 09/30/21  1:02 PM   Specimen: Nasopharyngeal Swab; Nasopharyngeal(NP) swabs in vial transport medium  Result Value Ref Range Status   SARS Coronavirus 2 by RT PCR NEGATIVE NEGATIVE Final    Comment: (NOTE) SARS-CoV-2 target nucleic acids are NOT DETECTED.  The SARS-CoV-2 RNA is generally detectable in upper respiratory specimens during the acute phase of infection. The lowest concentration of SARS-CoV-2 viral copies this assay can detect is 138 copies/mL. A negative result does not preclude SARS-Cov-2 infection and should not be used as the sole basis for treatment or other patient management decisions. A negative result may occur with  improper specimen collection/handling, submission of specimen other than nasopharyngeal swab, presence of viral mutation(s) within the areas targeted by this assay, and inadequate number of viral copies(<138 copies/mL). A negative result must be combined with clinical observations, patient history, and epidemiological information. The expected  result is Negative.  Fact Sheet for Patients:  EntrepreneurPulse.com.au  Fact Sheet for Healthcare Providers:  IncredibleEmployment.be  This test is no t yet approved or cleared by the [place] FDA and  has been authorized for detection and/or diagnosis of SARS-CoV-2 by FDA under an Emergency Use Authorization (EUA). This EUA will remain  in effect (meaning this test can be used) for the duration of the COVID-19 declaration under Section 564(b)(1) of the Act, 21 U.S.C.section 360bbb-3(b)(1), unless the authorization is terminated  or revoked sooner.       Influenza A by PCR NEGATIVE NEGATIVE Final   Influenza B by PCR NEGATIVE NEGATIVE Final    Comment: (NOTE) The Xpert Xpress SARS-CoV-2/FLU/RSV plus assay is intended as an aid in the diagnosis of influenza from Nasopharyngeal swab specimens and should not be used as a sole basis for treatment. Nasal washings and aspirates are unacceptable for Xpert Xpress SARS-CoV-2/FLU/RSV testing.  Fact Sheet for Patients: EntrepreneurPulse.com.au  Fact Sheet for Healthcare Providers: IncredibleEmployment.be  This test is not yet approved or cleared by the [place] FDA and has been authorized for detection and/or diagnosis of SARS-CoV-2 by FDA under an Emergency Use Authorization (  EUA). This EUA will remain in effect (meaning this test can be used) for the duration of the COVID-19 declaration under Section 564(b)(1) of the Act, 21 U.S.C. section 360bbb-3(b)(1), unless the authorization is terminated or revoked.  Performed at [facility], [place] [address], [place], [place] [address]      Labs: Basic Metabolic Panel: Recent Labs  Lab 09/30/21 1103 10/01/21 0400 10/02/21 0241 10/03/21 0230 10/03/21 1104 10/03/21 1115 10/04/21 0237 10/05/21 1448  NA 133* 132* 135  --  135 125* 134* 130*  K 4.6 2.8* 3.5  --  3.8 3.5 3.5 3.7  CL 99 97* 100  --    --   --  95* 90*  CO2 24 26 24   --   --   --  32 31  GLUCOSE 117* 90 111*  --   --   --  108* 127*  BUN 18 15 14   --   --   --  11 12  CREATININE 1.27* 1.14* 1.14*  --   --   --  1.02* 1.10*  CALCIUM 9.2 8.5* 8.5*  --   --   --  8.7* 9.1  MG  --   --   --  1.8  --   --   --   --    Liver Function Tests: Recent Labs  Lab 09/30/21 1302  AST 26  ALT 17  ALKPHOS 84  BILITOT 1.3*  PROT 7.0  ALBUMIN 4.1   No results for input(s): LIPASE, AMYLASE in the last 168 hours. No results for input(s): AMMONIA in the last 168 hours. CBC: Recent Labs  Lab 09/30/21 1103 10/01/21 0400 10/03/21 0230 10/03/21 1104 10/03/21 1115 10/04/21 0237 10/05/21 1448 10/06/21 0126  WBC 6.3 4.7 5.2  --   --  5.3 5.5 3.7*  NEUTROABS 5.0  --   --   --   --   --   --   --   HGB 12.1 10.7* 11.3* 12.6 12.2 11.3* 11.9* 11.0*  HCT 38.3 33.5* 35.1* 37.0 36.0 35.3* 36.3 33.7*  MCV 89.5 89.1 87.8  --   --  88.5 87.1 86.9  PLT 189 147* 166  --   --  162 176 145*   Cardiac Enzymes: No results for input(s): CKTOTAL, CKMB, CKMBINDEX, TROPONINI in the last 168 hours. BNP: BNP (last 3 results) Recent Labs    08/26/21 2149 09/30/21 1103  BNP 1,295.2* 2,815.3*    ProBNP (last 3 results) No results for input(s): PROBNP in the last 8760 hours.  CBG: No results for input(s): GLUCAP in the last 168 hours.  Principal Problem:   Acute on chronic combined systolic and diastolic CHF (congestive heart failure) (HCC) Active Problems:   Essential hypertension   PACEMAKER, PERMANENT   Atrial arrhythmia   Hypothyroidism   Severe aortic stenosis   CKD (chronic kidney disease) stage 3, GFR 30-59 ml/min (HCC)   AKI (acute kidney injury) ([place])   DNR (do not resuscitate)   Time coordinating discharge: 38 minutes.  Signed:        Keigan Girten, DO [facility]  10/06/2021, 6:06 PM

## 2021-10-06 NOTE — Progress Notes (Signed)
PT Cancellation Note  Patient Details Name: [name] [name] MRN: [id number] DOB: 17-Dec-1945   Cancelled Treatment:    Reason Eval/Treat Not Completed: Other (comment); attempted to see pt this am for pre-TAVR assessment.  Initially reported feeling too dizzy from medication she had and requested PT return.  Returned and pt reported still feeling woozy from meds and no food.  Her tray arrived and encouraged her to eat and PT would return.  Returned after 40 minutes and pt had been discharged.     Elray Mcgregor 10/06/2021, 12:51 PM
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
## 2021-10-07 ENCOUNTER — Other Ambulatory Visit (HOSPITAL_COMMUNITY): Payer: Self-pay

## 2021-10-08 MED FILL — Heparin Sod (Porcine)-NaCl IV Soln 1000 Unit/500ML-0.9%: INTRAVENOUS | Qty: 1000 | Status: AC

## 2021-10-08 MED FILL — Heparin Sodium (Porcine) Inj 1000 Unit/ML: INTRAMUSCULAR | Qty: 10 | Status: AC

## 2021-10-08 MED FILL — Verapamil HCl IV Soln 2.5 MG/ML: INTRAVENOUS | Qty: 2 | Status: AC

## 2021-10-10 ENCOUNTER — Telehealth: Payer: Self-pay

## 2021-10-10 ENCOUNTER — Inpatient Hospital Stay (HOSPITAL_COMMUNITY): Admit: 2021-10-10 | Discharge: 2021-10-10 | Disposition: A | Discharge status: Home / Self Care | Payer: Medicare Other

## 2021-10-10 NOTE — Progress Notes (Signed)
Spoke to [employer], [place] in regards to pt. Due to pt condition, she will be admitted directly to hospital for inpatient workup this weekend. Orders or procedure will be placed day before surgery. Short stay nurse does not have to do SDW/provide pre-op instructions as pt will get inpatient TAVR orders per PA.

## 2021-10-10 NOTE — Telephone Encounter (Signed)
Thank you Orpha Bur, I agree

## 2021-10-10 NOTE — Telephone Encounter (Signed)
Confirmed with the patient she will be called tomorrow when her room is ready for admission. She understands to report to Admitting and they will direct her to her room.  Upon request, called and spoke with her daughter, [name], and confirmed above information. Spoke with [name] at length. She is concerned her mother isn't taking her medications as directed. Khamila offered Lisa's husband a digoxin tablet last night when he said his back hurt. She said she takes it for back pain "all the time." [name] is also concerned because she thinks [place] is addicted to Xanax and Ambien. She has no idea how many pills she takes but knows it is more than is prescribed. Reiterated to her that digoxin should not be taken more than prescribed as it can cause HR issues and even death. She is concerned the patient does not want to live and isn't honest about when her appointments are so she doesn't have to go. [name] states her mother recently lied to her and told her the hospital tried to cancel her valve surgery and that all doctors are quacks.  Discussed with the patient her medications and when/how much she is to take. Reiterated to her that she is only to take digoxin one time daily and it is NOT for back pain. She states she doesn't think she's taking it at all.  Spoke with Carlean Jews for an update. Will send to [facility] (the [facility] on this weekend). Recommend digoxin level on admission and perhaps a psychiatric evaluation if the patient is willing.

## 2021-10-11 ENCOUNTER — Inpatient Hospital Stay (HOSPITAL_COMMUNITY): Payer: Medicare Other

## 2021-10-11 ENCOUNTER — Inpatient Hospital Stay (HOSPITAL_COMMUNITY)
Admission: RE | Admit: 2021-10-11 | Discharge: 2021-10-17 | DRG: 266 | Disposition: A | Discharge status: Home / Self Care | Payer: Medicare Other | Attending: Cardiology | Admitting: Cardiology

## 2021-10-11 ENCOUNTER — Encounter (HOSPITAL_COMMUNITY): Payer: Self-pay | Admitting: Cardiology

## 2021-10-11 DIAGNOSIS — J90 Pleural effusion, not elsewhere classified: Secondary | ICD-10-CM | POA: Diagnosis not present

## 2021-10-11 DIAGNOSIS — K5900 Constipation, unspecified: Secondary | ICD-10-CM | POA: Diagnosis not present

## 2021-10-11 DIAGNOSIS — Z823 Family history of stroke: Secondary | ICD-10-CM

## 2021-10-11 DIAGNOSIS — I517 Cardiomegaly: Secondary | ICD-10-CM | POA: Diagnosis not present

## 2021-10-11 DIAGNOSIS — T502X5A Adverse effect of carbonic-anhydrase inhibitors, benzothiadiazides and other diuretics, initial encounter: Secondary | ICD-10-CM | POA: Diagnosis not present

## 2021-10-11 DIAGNOSIS — Z952 Presence of prosthetic heart valve: Secondary | ICD-10-CM | POA: Diagnosis not present

## 2021-10-11 DIAGNOSIS — Z79899 Other long term (current) drug therapy: Secondary | ICD-10-CM | POA: Diagnosis not present

## 2021-10-11 DIAGNOSIS — E871 Hypo-osmolality and hyponatremia: Secondary | ICD-10-CM | POA: Diagnosis not present

## 2021-10-11 DIAGNOSIS — F419 Anxiety disorder, unspecified: Secondary | ICD-10-CM | POA: Diagnosis present

## 2021-10-11 DIAGNOSIS — N183 Chronic kidney disease, stage 3 unspecified: Secondary | ICD-10-CM | POA: Diagnosis not present

## 2021-10-11 DIAGNOSIS — Z955 Presence of coronary angioplasty implant and graft: Secondary | ICD-10-CM

## 2021-10-11 DIAGNOSIS — I509 Heart failure, unspecified: Secondary | ICD-10-CM

## 2021-10-11 DIAGNOSIS — E876 Hypokalemia: Secondary | ICD-10-CM | POA: Diagnosis not present

## 2021-10-11 DIAGNOSIS — Z806 Family history of leukemia: Secondary | ICD-10-CM

## 2021-10-11 DIAGNOSIS — I110 Hypertensive heart disease with heart failure: Secondary | ICD-10-CM | POA: Diagnosis not present

## 2021-10-11 DIAGNOSIS — I2510 Atherosclerotic heart disease of native coronary artery without angina pectoris: Secondary | ICD-10-CM | POA: Diagnosis present

## 2021-10-11 DIAGNOSIS — R531 Weakness: Secondary | ICD-10-CM | POA: Diagnosis not present

## 2021-10-11 DIAGNOSIS — Z006 Encounter for examination for normal comparison and control in clinical research program: Secondary | ICD-10-CM | POA: Diagnosis not present

## 2021-10-11 DIAGNOSIS — I5021 Acute systolic (congestive) heart failure: Secondary | ICD-10-CM | POA: Diagnosis not present

## 2021-10-11 DIAGNOSIS — Z20822 Contact with and (suspected) exposure to covid-19: Secondary | ICD-10-CM | POA: Diagnosis not present

## 2021-10-11 DIAGNOSIS — Z91199 Patient's noncompliance with other medical treatment and regimen due to unspecified reason: Secondary | ICD-10-CM

## 2021-10-11 DIAGNOSIS — Z950 Presence of cardiac pacemaker: Secondary | ICD-10-CM

## 2021-10-11 DIAGNOSIS — M549 Dorsalgia, unspecified: Secondary | ICD-10-CM | POA: Diagnosis present

## 2021-10-11 DIAGNOSIS — R7401 Elevation of levels of liver transaminase levels: Secondary | ICD-10-CM | POA: Diagnosis not present

## 2021-10-11 DIAGNOSIS — I480 Paroxysmal atrial fibrillation: Secondary | ICD-10-CM | POA: Diagnosis not present

## 2021-10-11 DIAGNOSIS — I099 Rheumatic heart disease, unspecified: Secondary | ICD-10-CM | POA: Diagnosis present

## 2021-10-11 DIAGNOSIS — I10 Essential (primary) hypertension: Secondary | ICD-10-CM | POA: Diagnosis present

## 2021-10-11 DIAGNOSIS — M7989 Other specified soft tissue disorders: Secondary | ICD-10-CM | POA: Diagnosis not present

## 2021-10-11 DIAGNOSIS — I350 Nonrheumatic aortic (valve) stenosis: Secondary | ICD-10-CM

## 2021-10-11 DIAGNOSIS — I272 Pulmonary hypertension, unspecified: Secondary | ICD-10-CM | POA: Diagnosis present

## 2021-10-11 DIAGNOSIS — I219 Acute myocardial infarction, unspecified: Secondary | ICD-10-CM | POA: Diagnosis present

## 2021-10-11 DIAGNOSIS — I130 Hypertensive heart and chronic kidney disease with heart failure and stage 1 through stage 4 chronic kidney disease, or unspecified chronic kidney disease: Secondary | ICD-10-CM | POA: Diagnosis not present

## 2021-10-11 DIAGNOSIS — I5043 Acute on chronic combined systolic (congestive) and diastolic (congestive) heart failure: Secondary | ICD-10-CM | POA: Diagnosis not present

## 2021-10-11 DIAGNOSIS — G8929 Other chronic pain: Secondary | ICD-10-CM | POA: Diagnosis present

## 2021-10-11 DIAGNOSIS — Z888 Allergy status to other drugs, medicaments and biological substances status: Secondary | ICD-10-CM

## 2021-10-11 DIAGNOSIS — I50811 Acute right heart failure: Secondary | ICD-10-CM

## 2021-10-11 DIAGNOSIS — Z7901 Long term (current) use of anticoagulants: Secondary | ICD-10-CM | POA: Diagnosis not present

## 2021-10-11 DIAGNOSIS — I5023 Acute on chronic systolic (congestive) heart failure: Secondary | ICD-10-CM | POA: Diagnosis not present

## 2021-10-11 DIAGNOSIS — K219 Gastro-esophageal reflux disease without esophagitis: Secondary | ICD-10-CM | POA: Diagnosis present

## 2021-10-11 DIAGNOSIS — I712 Thoracic aortic aneurysm, without rupture, unspecified: Secondary | ICD-10-CM | POA: Diagnosis not present

## 2021-10-11 DIAGNOSIS — K760 Fatty (change of) liver, not elsewhere classified: Secondary | ICD-10-CM | POA: Diagnosis present

## 2021-10-11 DIAGNOSIS — I252 Old myocardial infarction: Secondary | ICD-10-CM

## 2021-10-11 DIAGNOSIS — Z8249 Family history of ischemic heart disease and other diseases of the circulatory system: Secondary | ICD-10-CM

## 2021-10-11 DIAGNOSIS — Z79890 Hormone replacement therapy (postmenopausal): Secondary | ICD-10-CM

## 2021-10-11 DIAGNOSIS — J9811 Atelectasis: Secondary | ICD-10-CM | POA: Diagnosis not present

## 2021-10-11 DIAGNOSIS — Z91040 Latex allergy status: Secondary | ICD-10-CM

## 2021-10-11 DIAGNOSIS — E039 Hypothyroidism, unspecified: Secondary | ICD-10-CM | POA: Diagnosis present

## 2021-10-11 LAB — DIGOXIN LEVEL: Digoxin Level: <0.2 ng/mL — ABNORMAL LOW (ref 0.8–2.0)

## 2021-10-11 LAB — CBC
HCT: 30.9 % — ABNORMAL LOW (ref 36.0–46.0)
Hemoglobin: 10.5 g/dL — ABNORMAL LOW (ref 12.0–15.0)
MCH: 28.8 pg (ref 26.0–34.0)
MCHC: 34 g/dL (ref 30.0–36.0)
MCV: 84.9 fL (ref 80.0–100.0)
Platelets: 278 10*3/uL (ref 150–400)
RBC: 3.64 MIL/uL — ABNORMAL LOW (ref 3.87–5.11)
RDW: 16.1 % — ABNORMAL HIGH (ref 11.5–15.5)
WBC: 4.8 10*3/uL (ref 4.0–10.5)
nRBC: 0 % (ref 0.0–0.2)

## 2021-10-11 LAB — COMPREHENSIVE METABOLIC PANEL
ALT: 350 U/L — ABNORMAL HIGH (ref 0–44)
AST: 587 U/L — ABNORMAL HIGH (ref 15–41)
Albumin: 3.5 g/dL (ref 3.5–5.0)
Alkaline Phosphatase: 92 U/L (ref 38–126)
Anion gap: 8 (ref 5–15)
BUN: 20 mg/dL (ref 8–23)
CO2: 30 mmol/L (ref 22–32)
Calcium: 8.7 mg/dL — ABNORMAL LOW (ref 8.9–10.3)
Chloride: 93 mmol/L — ABNORMAL LOW (ref 98–111)
Creatinine, Ser: 1.24 mg/dL — ABNORMAL HIGH (ref 0.44–1.00)
GFR, Estimated: 46 mL/min — ABNORMAL LOW (ref >60)
Glucose, Bld: 118 mg/dL — ABNORMAL HIGH (ref 70–99)
Potassium: 3.4 mmol/L — ABNORMAL LOW (ref 3.5–5.1)
Sodium: 131 mmol/L — ABNORMAL LOW (ref 135–145)
Total Bilirubin: 1.4 mg/dL — ABNORMAL HIGH (ref 0.3–1.2)
Total Protein: 5.9 g/dL — ABNORMAL LOW (ref 6.5–8.1)

## 2021-10-11 LAB — BRAIN NATRIURETIC PEPTIDE: B Natriuretic Peptide: 2466.3 pg/mL — ABNORMAL HIGH (ref 0.0–100.0)

## 2021-10-11 LAB — HEPARIN LEVEL (UNFRACTIONATED): Heparin Unfractionated: 0.27 IU/mL — ABNORMAL LOW (ref 0.30–0.70)

## 2021-10-11 LAB — TSH: TSH: 4.347 u[IU]/mL (ref 0.350–4.500)

## 2021-10-11 MED ORDER — ALBUTEROL SULFATE (2.5 MG/3ML) 0.083% IN NEBU: 2.5000 mg | INHALATION_SOLUTION | Freq: Four times a day (QID) | RESPIRATORY_TRACT | Status: DC | PRN

## 2021-10-11 MED ORDER — PANTOPRAZOLE SODIUM 40 MG PO TBEC
40.0000 mg | DELAYED_RELEASE_TABLET | Freq: Every day | ORAL | Status: DC
  Administered 2021-10-11 – 2021-10-17 (×6): 40 mg via ORAL
  Filled 2021-10-11 (×6): qty 1

## 2021-10-11 MED ORDER — ZOLPIDEM TARTRATE 5 MG PO TABS
5.0000 mg | ORAL_TABLET | Freq: Every evening | ORAL | Status: DC | PRN
  Administered 2021-10-11 – 2021-10-16 (×6): 5 mg via ORAL
  Filled 2021-10-11 (×6): qty 1

## 2021-10-11 MED ORDER — ALPRAZOLAM 0.25 MG PO TABS: 0.2500 mg | ORAL_TABLET | Freq: Two times a day (BID) | ORAL | Status: DC | PRN

## 2021-10-11 MED ORDER — ATORVASTATIN CALCIUM 80 MG PO TABS: 80.0000 mg | ORAL_TABLET | Freq: Every day | ORAL | Status: DC

## 2021-10-11 MED ORDER — SODIUM CHLORIDE 0.9 % IV SOLN: 250.0000 mL | INTRAVENOUS | Status: DC | PRN

## 2021-10-11 MED ORDER — POTASSIUM CHLORIDE CRYS ER 20 MEQ PO TBCR: 40.0000 meq | EXTENDED_RELEASE_TABLET | Freq: Every day | ORAL | Status: DC

## 2021-10-11 MED ORDER — OXYCODONE HCL 5 MG PO TABS
2.5000 mg | ORAL_TABLET | ORAL | Status: DC | PRN
  Administered 2021-10-11 – 2021-10-14 (×9): 5 mg via ORAL
  Filled 2021-10-11 (×8): qty 1

## 2021-10-11 MED ORDER — BOOST PLUS PO LIQD
237.0000 mL | Freq: Two times a day (BID) | ORAL | Status: DC
  Administered 2021-10-11 – 2021-10-16 (×8): 237 mL via ORAL
  Filled 2021-10-11 (×13): qty 237

## 2021-10-11 MED ORDER — BACLOFEN 10 MG PO TABS
10.0000 mg | ORAL_TABLET | Freq: Three times a day (TID) | ORAL | Status: DC | PRN
  Administered 2021-10-16 (×2): 10 mg via ORAL
  Filled 2021-10-11 (×4): qty 1

## 2021-10-11 MED ORDER — NITROGLYCERIN 0.4 MG SL SUBL: 0.4000 mg | SUBLINGUAL_TABLET | SUBLINGUAL | Status: DC | PRN

## 2021-10-11 MED ORDER — FUROSEMIDE 10 MG/ML IJ SOLN
40.0000 mg | Freq: Two times a day (BID) | INTRAMUSCULAR | Status: DC
  Administered 2021-10-11 – 2021-10-15 (×7): 40 mg via INTRAVENOUS
  Filled 2021-10-11 (×7): qty 4

## 2021-10-11 MED ORDER — HEPARIN (PORCINE) 25000 UT/250ML-% IV SOLN
900.0000 [IU]/h | INTRAVENOUS | Status: DC
Start: 2021-10-11 — End: 2021-10-14
  Administered 2021-10-11: 15:00:00 800 [IU]/h via INTRAVENOUS
  Administered 2021-10-12 – 2021-10-13 (×2): 900 [IU]/h via INTRAVENOUS
  Filled 2021-10-11 (×3): qty 250

## 2021-10-11 MED ORDER — POTASSIUM CHLORIDE CRYS ER 20 MEQ PO TBCR
40.0000 meq | EXTENDED_RELEASE_TABLET | Freq: Two times a day (BID) | ORAL | Status: DC
  Administered 2021-10-11 – 2021-10-12 (×2): 40 meq via ORAL
  Filled 2021-10-11 (×2): qty 2

## 2021-10-11 MED ORDER — ACETAMINOPHEN 325 MG PO TABS: 650.0000 mg | ORAL_TABLET | ORAL | Status: DC | PRN

## 2021-10-11 MED ORDER — DOCUSATE SODIUM 100 MG PO CAPS
100.0000 mg | ORAL_CAPSULE | Freq: Two times a day (BID) | ORAL | Status: DC
  Administered 2021-10-11 – 2021-10-17 (×11): 100 mg via ORAL
  Filled 2021-10-11 (×11): qty 1

## 2021-10-11 MED ORDER — SODIUM CHLORIDE 0.9% FLUSH: 3.0000 mL | INTRAVENOUS | Status: DC | PRN

## 2021-10-11 MED ORDER — SODIUM CHLORIDE 0.9% FLUSH
3.0000 mL | Freq: Two times a day (BID) | INTRAVENOUS | Status: DC
  Administered 2021-10-11 – 2021-10-13 (×4): 3 mL via INTRAVENOUS

## 2021-10-11 MED ORDER — ALPRAZOLAM 0.5 MG PO TABS
0.5000 mg | ORAL_TABLET | Freq: Two times a day (BID) | ORAL | Status: DC | PRN
Start: 2021-10-11 — End: 2021-10-17
  Administered 2021-10-11 – 2021-10-15 (×8): 0.5 mg via ORAL
  Administered 2021-10-16: 23:00:00 1 mg via ORAL
  Administered 2021-10-16: 12:00:00 0.5 mg via ORAL
  Administered 2021-10-17: 09:00:00 1 mg via ORAL
  Filled 2021-10-11: qty 2
  Filled 2021-10-11 (×3): qty 1
  Filled 2021-10-11: qty 2
  Filled 2021-10-11 (×6): qty 1

## 2021-10-11 MED ORDER — LEVOTHYROXINE SODIUM 75 MCG PO TABS
75.0000 ug | ORAL_TABLET | Freq: Every day | ORAL | Status: DC
  Administered 2021-10-12 – 2021-10-17 (×5): 75 ug via ORAL
  Filled 2021-10-11 (×5): qty 1

## 2021-10-11 NOTE — Progress Notes (Signed)
Brief note:  Digoxin level <0.2  Pacemaker interrogation document reviewed: Longevity 13 months Normal pacer parameters No recent events documented  Cardiology covering physician Donnald Garre, MD, [facility]

## 2021-10-11 NOTE — Progress Notes (Signed)
Patient arrived to 4E as a direct admit. Vital signs taken and stable. Placed on tele and notified CCMD. Patient has permanent pacemaker. CHG done and assessment completed. Skin checked and patient has generalized bruising. Patient indicated that she is on home O2. Placed on 3L of oxygen. RN paged Cardiology PA and they will come to bedside to see patient. Attempted to place IV but had to call IV team due to hard stick. Patient oriented to unit. Family member on the way. Call bell within reach.  [name] [name]

## 2021-10-11 NOTE — H&P (Addendum)
Cardiology Admission History and Physical:   Patient ID: [name] [name] MRN: [id number]; DOB: November 14, 1945   Admission date: 10/11/2021  PCP:  Ronita Hipps, MD   [facility] Providers Cardiologist:  Sherren Mocha, MD      Dr Dolan Amen in [facility]ter[facility] [name]  Chief Complaint:  severe AS  Patient Profile:   [name] [name] is a 75 y.o. female with a hx of rheumatic heart disease s/p mech MV on warfarin, long QT w/ bradycardia s/p PPM, CHF, HTN, atrial flutter with a history of ablation and recurrent atrial arrhythmias, severe AS, hypothyroid, GERD,  who is being seen 10/11/2021 for the evaluation of AS.  History of Present Illness:   Ms. Wahl was admitted 12/13-12/19/2022 for acute on chronic CHF felt secondary to severe aortic stenosis.  She was seen by the Structural Heart team and outpatient TAVR was planned.  She was discharged off of her Coumadin and with subcu Lovenox for anticoagulation.  She returns to the hospital today for heparin anticoagulation and volume management prior to her TAVR scheduled on 12/27.  Ms. Fullam says her shortness of breath has gotten worse again since her discharge.  She also has had worsening lower extremity edema.  She has significant back problems, and because of that and her breathing, has been sitting up most of the time.  She cannot lie down.  She also describes orthopnea.  She has not had chest pain or palpitations.  She has not had syncope.  Currently, she is significantly short of breath when not on O2.   Past Medical History:  Diagnosis Date   Anxiety    Aortic aneurysm [facility])    Atrial arrhythmia    With prior history of atrial flutter ablation   Bradycardia    s/p pacemaker implantation   CONGESTIVE HEART FAILURE 07/31/2010   Qualifier: Diagnosis of  By: Caryl Comes, MD, Remus Blake    GERD (gastroesophageal reflux disease)    Hypertension    Hypothyroidism    Long Q-T syndrome    Myocardial infarct ([place])  11/20/2011   Stent placement for 100% blockage of LAD   PACEMAKER, PERMANENT 2009   [employer] Adapta serial # [id number]   Rheumatic heart disease    With mitral valve replacement, tricuspid regurgitation moderate to severe, significant left atrial enlargment   TRICUSPID REGURGITATION 12/23/2010   Qualifier: Diagnosis of  By: Caryl Comes, MD, Remus Blake    Unspecified essential hypertension 07/31/2010   Qualifier: Diagnosis of  By: Caryl Comes, MD, [facility], Mack Guise     Past Surgical History:  Procedure Laterality Date   APPENDECTOMY     CARDIAC ELECTROPHYSIOLOGY STUDY AND ABLATION     Stent and pacemaker placement   CHOLECYSTECTOMY     CORONARY ANGIOPLASTY WITH STENT PLACEMENT Left 09/13/2015   Dr. Gwenith Spitz at [facility], LAD stent   MITRAL VALVE REPLACEMENT  2001   mechanical valve   OOPHORECTOMY     PACEMAKER INSERTION  11/20/2007   Dual chamber pacemaker implantation with rapid ventricular pacing   RIGHT/LEFT HEART CATH AND CORONARY ANGIOGRAPHY N/A 10/03/2021   Procedure: RIGHT/LEFT HEART CATH AND CORONARY ANGIOGRAPHY;  Surgeon: Sherren Mocha, MD;  Location: [facility];  Service: Cardiovascular;  Laterality: N/A;   SALPINGECTOMY Bilateral    TONSILLECTOMY       Medications Prior to Admission: Prior to Admission medications   Medication Sig Start Date End Date Taking? Authorizing Provider  albuterol (VENTOLIN HFA) 108 (90 Base) MCG/ACT inhaler Inhale 2 puffs  into the lungs every 6 (six) hours as needed for shortness of breath. 03/21/14  Yes [provider]  ALPRAZolam Prudy Feeler) 1 MG tablet Take 0.5-1 mg by mouth 2 (two) times daily as needed for anxiety.   Yes [provider]  atorvastatin (LIPITOR) 80 MG tablet Take 80 mg by mouth at bedtime. 11/27/11  Yes [provider]  baclofen (LIORESAL) 20 MG tablet Take 10 mg by mouth every 8 (eight) hours as needed for muscle spasms. 01/03/21  Yes [provider]  digoxin (LANOXIN) 0.125 MG  tablet Take 1 tablet (125 mcg total) by mouth daily. 10/06/21 10/06/22 Yes Swayze, Ava, DO  docusate sodium (COLACE) 100 MG capsule Take 1 capsule (100 mg total) by mouth 2 (two) times daily. 10/06/21  Yes Swayze, Ava, DO  enoxaparin (LOVENOX) 60 MG/0.6ML injection Inject 1 syringe (60 mg total) into the skin 2 (two) times daily. Begin 10/04/21. Last dose pre-operatively is morning of 10/13/21. 10/06/21  Yes Swayze, Ava, DO  KLOR-CON M20 20 MEQ tablet Take 40 mEq by mouth See admin instructions. Crush and mix 40 mEq with applesauce and eat once a day   Yes [provider]  lactose free nutrition (BOOST PLUS) LIQD Take 237 mLs by mouth 2 (two) times daily.   Yes [provider]  levothyroxine (SYNTHROID) 75 MCG tablet Take 75 mcg by mouth daily before breakfast. 03/12/21 10/31/21 Yes [provider]  nitroGLYCERIN (NITROSTAT) 0.4 MG SL tablet Place 0.4 mg under the tongue every 5 (five) minutes as needed for chest pain. 01/14/21  Yes [provider]  oxyCODONE (OXY IR/ROXICODONE) 5 MG immediate release tablet Take 0.5-1 tablets (2.5-5 mg total) by mouth every 4 (four) hours as needed for moderate pain. 10/06/21  Yes Swayze, Ava, DO  pantoprazole (PROTONIX) 40 MG tablet Take 1 tablet (40 mg total) by mouth daily. 10/06/21  Yes Swayze, Ava, DO  zolpidem (AMBIEN) 10 MG tablet Take 5-10 mg by mouth at bedtime. 11/27/11  Yes [provider]  furosemide (LASIX) 20 MG tablet Take 40 mg by mouth 2 (two) times daily. Patient not taking: Reported on 10/11/2021 09/21/21   [provider]     Allergies:    Allergies  Allergen Reactions   Atenolol Other (See Comments)    HR and pulse dropped   Metoprolol Succinate [Metoprolol] Other (See Comments)    HR and pulse dropped   Baclofen Anxiety and Other (See Comments)    Makes the patient feel anxious the day after taking this   Latex Rash    Social History:   Social History   Socioeconomic History    Marital status: Single    Spouse name: Not on file   Number of children: Not on file   Years of education: Not on file   Highest education level: Not on file  Occupational History   Not on file  Tobacco Use   Smoking status: Never   Smokeless tobacco: Never  Substance and Sexual Activity   Alcohol use: Never   Drug use: Never   Sexual activity: Not on file  Other Topics Concern   Not on file  Social History Narrative   Not on file   Social Determinants of Health   Financial Resource Strain: Not on file  Food Insecurity: Not on file  Transportation Needs: Not on file  Physical Activity: Not on file  Stress: Not on file  Social Connections: Not on file  Intimate Partner Violence: Not on file  Family History:   The patient's family history includes Heart attack in her paternal grandmother; Heart disease in her father; Leukemia in her mother; Stroke in her maternal grandmother and mother.    ROS:  Please see the history of present illness.  All other ROS reviewed and negative.     Physical Exam/Data:   Vitals:   10/11/21 1241 10/11/21 1517  BP: (!) 129/54 (!) 147/55  Pulse: 63 63  Resp: 20 19  Temp: 97.7 F (36.5 C) 97.9 F (36.6 C)  TempSrc: Oral Oral  SpO2: 98% 96%  Weight: 55.3 kg   Height: 4\' 10"  (1.473 m)     Intake/Output Summary (Last 24 hours) at 10/11/2021 1615 Last data filed at 10/11/2021 1543 Gross per 24 hour  Intake --  Output 200 ml  Net -200 ml   Last 3 Weights 10/11/2021 10/06/2021 10/05/2021  Weight (lbs) 122 lb 121 lb 4.1 oz 118 lb 4.8 oz  Weight (kg) 55.339 kg 55 kg 53.661 kg     Body mass index is 25.5 kg/m.  General:  Well nourished, well developed, in moderate respiratory distress HEENT: normal Neck:  JVD 10 cm Vascular: No carotid bruits; Distal pulses 2+ bilaterally   Cardiac:  normal S1, S2; RRR; 3/6 murmur  Lungs: Rales bases bilaterally, no wheezing, rhonchi Abd: soft, nontender, no hepatomegaly  Ext: 1-2+ LE  edema Musculoskeletal:  No new deformities, BUE and BLE strength weak but equal, she has kyphosis and scoliosis at baseline Skin: warm and dry  Neuro:  CNs 2-12 intact, no focal abnormalities noted Psych:  Normal affect    EKG:  The ECG that was done 12/18 was personally reviewed and demonstrates AV pacing, heart rate 65  Relevant CV Studies:  CARDIAC CATH: 10/03/2021   Mid LAD lesion is 20% stenosed.   1st Mrg lesion is 40% stenosed.   Prox RCA lesion is 30% stenosed.   Hemodynamic findings consistent with moderate pulmonary hypertension.   There is severe aortic valve stenosis.   1.  Patent coronary arteries with diffuse nonobstructive CAD, continued patency of the mid LAD stent 2.  Known severe aortic stenosis with heavy calcification and restriction of the aortic valve leaflets 3.  Normal fluoroscopic motion of the patient's mechanical mitral prosthesis 4.  Moderate pulmonary hypertension with mean PA pressure 36 mmHg, transpulmonary gradient 13 mmHg, PVR 3.8 Wood units   Recommend: Resume warfarin today, start Lovenox tomorrow morning for bridging, tentatively plan TAVR 12/27 we will coordinate hospital admission for bridging into the procedure.  Medical therapy for nonobstructive CAD.  Continue IV diuresis today with elevated filling pressures.  Echo: SUMMARY  The left ventricular size is normal.  There is moderate concentric left ventricular hypertrophy.  Left ventricular systolic function is moderately reduced.  LV ejection fraction = 35-40%.  Abnormal (paradoxical) septal motion consistent with RV pacemaker.  There are regional wall motion abnormalities as specified below.  There is severe hypokinesis and thinning of the anterior,  anteroseptal, inferoseptal territory from mid to apex.  The right ventricle is normal in size and function.  The left atrium is severely dilated.  Diffuse thickening of the aortic valve with restricted cusp opening.  There is critically  severe aortic stenosis.  There is moderate aortic regurgitation.  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  There is moderate tricuspid regurgitation. Moderate pulmonary  hypertension.  The aortic Sinus(es) of Valsalva are borderline dilated.  The IVC is normal in size with an inspiratory collapse of greater then  50%, suggesting normal right atrial pressure.  There is no pericardial effusion.  Compared to prior study dated 12/30/20, AS is now critically severe.   -  FINDINGS:  LEFT VENTRICLE  The left ventricular size is normal. Upper septal hypertrophy (sigmoid  septum), normal variant. There is moderate concentric left ventricular  hypertrophy. Left ventricular systolic function is moderately reduced.  LV ejection fraction = 35-40%. LV Global L Strain =-8.3%. There is  severe [Grade III] diastolic dysfunction [restrictive filling  pattern], with elevated left atrial pressure. Mitral inflow  deceleration timeAbnormal<150 msec. Consistent with restrictive  physiology.. Abnormal (paradoxical) septal motion consistent with RV  pacemaker. There are regional wall motion abnormalities as specified  below. There is severe hypokinesis and thinning of the anterior,  anteroseptal, inferoseptal territory from mid to apex.   -  RIGHT VENTRICLE  The right ventricle is normal in size and function.   LEFT ATRIUM  The left atrium is severely dilated. LAVI 111.0 ml/m2.   RIGHT ATRIUM  Right atrial size is normal.  -  AORTIC VALVE  The aortic valve is trileaflet. Diffuse thickening of the aortic valve  with restricted cusp opening. There is critically severe aortic  stenosis. There is moderate aortic regurgitation. The calculated  aortic valve area using the continuity equation is 0.4 cm2. Aortic  valve mean pressure gradient is 53 mmHg. The peak aortic valve  velocity 449 cm/s.  -  MITRAL VALVE  There is no mitral regurgitation noted. The mean gradient across the  mitral valve is 3.5 mmHg.  The heart rate for the mean mitral valve  gradient is 61 BPM. 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  -  TRICUSPID VALVE  Structurally normal tricuspid valve. There is moderate tricuspid  regurgitation. Estimated right ventricular systolic pressure is 50  mmHg. Moderate pulmonary hypertension.  -  PULMONIC VALVE  Structurally normal pulmonic valve. Trace pulmonic valvular  regurgitation.  -  ARTERIES  The aortic Sinus(es) of Valsalva are borderline dilated. The ascending  aorta is normal size. Mild pulmonary artery dilation. Measures 3.2 cm.   -  VENOUS  Pulmonary venous flow pattern not well visualized. The IVC is normal  in size with an inspiratory collapse of greater then 50%, suggesting  normal right atrial pressure.  -  EFFUSION  There is no pericardial effusion.  -  -   MMode/2D Measurements & Calculations  IVSd: 1.7 cm       LA dim: 6.0 cm    ESV(MOD-sp4):  Ao sinus diam:  LVIDd: 5.2 cm      EDV(MOD-sp4):     75.2 ml        3.8 cm  LVPWd: 1.3 cm      121.0 ml          EDV(MOD-sp2):  LVIDs: 3.7 cm                        169.0 ml                                       ESV(MOD-sp2):                                       113.0 ml          _______________________________________________________________________  asc Aorta Diam:  LVOT diam: 2.0 cm SV(MOD-sp4):   EF A4C: 37.9 %  3.6 cm                               45.8 ml                                       SI(MOD-sp4):                                       29.8 ml/m2          _______________________________________________________________________  LA area A2:        LA area A4:       LA ESV (BP):   LA ESV Index  40.9 cm2           41.2 cm2          171.0 ml       (A2C): 109.1 ml/m2           _______________________________________________________________________  LA ESV Index       LA ESV Index (BP):LA vol:        LA vol index:  (A4C): 111.0 ml/m2 111.0 ml/m2       183.4 ml       119.4 ml/m2            _______________________________________________________________________  SV A4C: 45.8 ml   Doppler Measurements & Calculations  MV E max vel:       MV V2 max:       MV P1/2t max IN:[id number]):  138.0 cm/sec        166.8 cm/sec     162.4 cm/sec     45.3 ml  MV A max vel:       MV max PG:       MV P1/2t:        Ao V2 max:  59.2 cm/sec         11.1 mmHg        58.5 msec        491.2 cm/sec  MV E/A: 2.3         MV V2 mean:      MV dec time:     Ao max PG:  Med Peak E' Vel:    77.0 cm/sec      0.12 sec         96.5 mmHg  4.8 cm/sec          MV mean PG:                       Ao V2 mean:  Lat Peak E' Vel:    3.0 mmHg                          352.9 cm/sec  12.5 cm/sec         MV V2 VTI:                        Ao mean PG:  E/Lat E`: 11.0      37.9 cm  57.4 mmHg  E/Med E`: 28.8      MVA(P1/2t):                       Ao V2 VTI:                      3.8 cm2                           144.2 cm                      MVA(VTI): 1.2 cm2                 AVA (VTI):                                                        0.31 cm2          _______________________________________________________________________  AI max vel:         LV V1 VTI:       TR max vel:      RAP systole:  401.4 cm/sec        14.7 cm          342.1 cm/sec     3.0 mmHg  AI dec slope:                        TR max PG:  335.9 cm/sec2                        46.8 mmHg  AI P1/2t:                            RVSP(TR):  350.0 msec                           49.8 mmHg           _______________________________________________________________________  AS Dimensionless    AVAi(VTI)        SV index(LVOT):  Index (VTI): 0.10   cm^2/m^2:                                       29.5 ml/m2                      0.20 cm2    CTA CHEST FINDINGS   Cardiovascular: Heart size is severely enlarged. There is no significant pericardial fluid, thickening or pericardial calcification. There is aortic atherosclerosis, as well  as atherosclerosis of the great vessels of the mediastinum and the coronary arteries, including calcified atherosclerotic plaque in the left main, left anterior descending, left circumflex and right coronary arteries. Myocardial thinning and subendocardial hypoattenuation throughout the mid to distal LAD territory, likely sequela of prior LAD territory myocardial infarction(s). This is associated with some mild aneurysmal dilatation of the left ventricular apex. Severe dilatation of the pulmonic trunk (4.3 cm in diameter), concerning for pulmonary arterial hypertension. Severe thickening calcification of the aortic valve. 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 Calcifications  of the mitral annulus. Left-sided pacemaker device in place with lead tips terminating in the right atrial appendage and right ventricular apex.   Mediastinum/Lymph Nodes: No pathologically enlarged mediastinal or hilar lymph nodes. Hilar esophagus is unremarkable in appearance. No axillary lymphadenopathy.   Lungs/Pleura: Small bilateral pleural effusions (right greater than left) lying dependently. Scattered areas of subsegmental atelectasis are noted in the lower lungs, along with near complete atelectasis of the right middle lobe. There is a background of mild diffuse ground-glass attenuation and interlobular septal thickening throughout the lungs bilaterally, suggesting the presence of mild interstitial pulmonary edema.   Musculoskeletal/Soft Tissues: Median sternotomy wires. There are no aggressive appearing lytic or blastic lesions noted in the visualized portions of the skeleton.   CTA ABDOMEN AND PELVIS FINDINGS   Hepatobiliary: Heterogeneous attenuation throughout the hepatic parenchyma, with large areas of low attenuation most evident in the right lobe of the liver, favored to reflect a background of heterogeneous hepatic steatosis. No definite focal cystic or  solid hepatic lesions. No intra or extrahepatic biliary ductal dilatation. Status post cholecystectomy.   Pancreas: No pancreatic mass. No pancreatic ductal dilatation. No pancreatic or peripancreatic fluid collections or inflammatory changes.   Spleen: Small splenule inferior to the spleen. Otherwise, unremarkable.   Adrenals/Urinary Tract: Mild multifocal cortical thinning in both kidneys. Subcentimeter low-attenuation lesion in the upper pole of the left kidney, too small to characterize, but statistically likely to represent a tiny cyst. Bilateral adrenal glands are normal in appearance. No hydroureteronephrosis. Urinary bladder is normal in appearance.   Stomach/Bowel: The appearance of the stomach is normal. There is no pathologic dilatation of small bowel or colon. The appendix is not confidently identified and may be surgically absent. Regardless, there are no inflammatory changes noted adjacent to the cecum to suggest the presence of an acute appendicitis at this time.   Vascular/Lymphatic: Aortic atherosclerosis, with vascular findings and measurements pertinent to potential TAVR procedure, as detailed below. No aneurysm or dissection noted in the abdominal or pelvic vasculature. No lymphadenopathy identified in the abdomen or pelvis.   Reproductive: Uterus and ovaries are atrophic.   Other: No significant volume of ascites.  No pneumoperitoneum.   Musculoskeletal: There are no aggressive appearing lytic or blastic lesions noted in the visualized portions of the skeleton.   VASCULAR MEASUREMENTS PERTINENT TO TAVR:   AORTA:   Minimal Aortic Diameter-13 x 14 mm   Severity of Aortic Calcification-moderate to severe   RIGHT PELVIS:   Right Common Iliac Artery -   Minimal Diameter-7.7 x 7.1 mm   Tortuosity-mild   Calcification-moderate   Right External Iliac Artery -   Minimal Diameter-7.1 x 7.1 mm   Tortuosity-mild-to-moderate   Calcification-none    Right Common Femoral Artery -   Minimal Diameter-6.5 x 6.3 mm   Tortuosity-mild   Calcification-none   LEFT PELVIS:   Left Common Iliac Artery -   Minimal Diameter-8.2 x 8.7 mm   Tortuosity-mild   Calcification-moderate   Left External Iliac Artery -   Minimal Diameter-6.3 x 7.2 mm   Tortuosity-mild   Calcification-none   Left Common Femoral Artery -   Minimal Diameter-6.8 x 6.8 mm   Tortuosity-mild   Calcification-none   Review of the MIP images confirms the above findings.   IMPRESSION: 1. Vascular findings and measurements pertinent to potential TAVR procedure, as detailed above. 2. Severe thickening calcification of the aortic valve, compatible with reported clinical history of severe aortic stenosis. 3. Cardiomegaly with findings suggestive of congestive heart  failure, including evidence of mild interstitial pulmonary edema and small bilateral pleural effusions, as detailed above. 4. Aortic atherosclerosis, in addition to left main and 3 vessel coronary artery disease. There is also evidence of prior LAD territory myocardial infarction(s), as above. 5. Severe dilatation of the pulmonic trunk (4.3 cm in diameter), concerning for pulmonary arterial hypertension. 6. Heterogeneous hepatic steatosis. 7. Additional incidental findings, as above.  Laboratory Data:  High Sensitivity Troponin:   Recent Labs  Lab 09/30/21 1103 09/30/21 1302  TROPONINIHS 28* 23*      Chemistry Recent Labs  Lab 10/05/21 1448  NA 130*  K 3.7  CL 90*  CO2 31  GLUCOSE 127*  BUN 12  CREATININE 1.10*  CALCIUM 9.1  GFRNONAA 53*  ANIONGAP 9    No results for input(s): PROT, ALBUMIN, AST, ALT, ALKPHOS, BILITOT in the last 168 hours. Lipids No results for input(s): CHOL, TRIG, HDL, LABVLDL, LDLCALC, CHOLHDL in the last 168 hours. Hematology Recent Labs  Lab 10/06/21 0126 10/11/21 1341  WBC 3.7* 4.8  RBC 3.88 3.64*  HGB 11.0* 10.5*  HCT 33.7* 30.9*  MCV 86.9  84.9  MCH 28.4 28.8  MCHC 32.6 34.0  RDW 15.8* 16.1*  PLT 145* 278   Thyroid No results for input(s): TSH, FREET4 in the last 168 hours. BNPNo results for input(s): BNP, PROBNP in the last 168 hours.  DDimer  No results for input(s): DDIMER in the last 168 hours.  Lab Results  Component Value Date   INR 1.5 (H) 10/06/2021   INR 1.5 (H) 10/05/2021   INR 1.7 (H) 10/04/2021     Radiology/Studies:  No results found.   Assessment and Plan:   Severe aortic stenosis -She is here in preparation for TAVR - CTs and other evaluation have been completed - No procedure scheduled for 12/27  2.  Acute on chronic combined systolic and diastolic CHF - Her weight November 2022 was 52 kg. - During her admission earlier this month, her peak weight was 55.1 kg. -Today her weight is 55.3 kg -  She has evidence of volume overload by exam - Discuss diuresis with MD   Risk Assessment/Risk Scores:      New York Heart Association (NYHA) Functional Class NYHA Class IV   For questions or updates, please contact [facility] Please consult [email or web address] for contact info under     Signed, Rosaria Ferries, PA-C  10/11/2021 4:15 PM    Patient seen and examined.  Agree with above documentation.  Ms. Brignac is a 75 year old female with a history of critical aortic stenosis, rheumatic heart disease, mechanical mitral valve replacement 20 years ago, PPM, atrial fibrillation who presents for direct admission for heparin bridge prior to TAVR planned on 10/14/2021.  She was admitted from 12/13 through 10/06/2021 for decompensated heart failure secondary to severe AS.  TAVR planned on 12/27.  She reports since her discharge on 12/19 she is progressively more short of breath and noted worsening lower extremity edema.  Denies any chest pain.  Cath on 10/03/2021 showed nonobstructive CAD, patent LAD stent, severe aortic stenosis.  Labs today show potassium 3.4, sodium 131, creatinine 1.24 (up from 1.10  on 12/18), AST 587, ALT 350, total bilirubin 1.4, BNP 2466, hemoglobin 10.5, platelets 278, WBC 4.8, TSH 4.3.  Chest x-ray with bilateral pleural effusions.  On exam, patient is alert and oriented, regular rate and rhythm, 3 out of 6 systolic murmur, bibasilar crackles, 2+ bilateral lower extremity edema, + JVD.  We will start  heparin drip to bridge her to surgery.  She appears volume overloaded on exam.  We will start IV Lasix 40 mg twice daily.  LFTs significantly elevated.  Suspect due to congestion, will diurese as above.  Will check right upper quadrant ultrasound for further evaluation and hold her statin.  Check digoxin level.  Donato Heinz, MD

## 2021-10-11 NOTE — Progress Notes (Signed)
ANTICOAGULATION CONSULT NOTE - Initial Consult  Pharmacy Consult for heparin Indication: atrial fibrillation  Allergies  Allergen Reactions   Atenolol Other (See Comments)    HR and pulse dropped   Metoprolol Succinate [Metoprolol] Other (See Comments)    HR and pulse dropped   Baclofen Anxiety and Other (See Comments)    Makes the patient feel anxious the day after taking this   Latex Rash    Patient Measurements: Height: 4\' 10"  (147.3 cm) Weight: 55.3 kg (122 lb) IBW/kg (Calculated) : 40.9 Heparin Dosing Weight: 52.4 kg  Vital Signs: Temp: 97.7 F (36.5 C) (12/24 1241) Temp Source: Oral (12/24 1241) BP: 129/54 (12/24 1241) Pulse Rate: 63 (12/24 1241)  Labs: No results for input(s): HGB, HCT, PLT, APTT, LABPROT, INR, HEPARINUNFRC, HEPRLOWMOCWT, CREATININE, CKTOTAL, CKMB, TROPONINIHS in the last 72 hours.  Estimated Creatinine Clearance: 33.1 mL/min (A) (by C-G formula based on SCr of 1.1 mg/dL (H)).   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 She was previously anticoagulated on warfarin but was transitioned to enoxaparin on 12/17 in anticipation of surgery. Last dose of enoxaparin was 10/10/21 ~20:00 per patient report. Pharmacy is now consulted to transition to heparin. CBC ordered and pending.   Goal of Therapy:  Heparin level 0.3-0.7 units/ml Monitor platelets by anticoagulation protocol: Yes   Plan:  Heparin 800 units/hr  Heparin level in ~6-8 hours Daily heparin level, CBC, and monitor for s/sx of bleeding  Thank you for including pharmacy in the care of this patient.  10/12/21, PharmD PGY1 Acute Care Pharmacy Resident  Phone: [phone] 10/11/2021  1:28 PM  Please check [email or web address] for unit-specific pharmacy phone numbers.

## 2021-10-12 ENCOUNTER — Inpatient Hospital Stay (HOSPITAL_COMMUNITY): Payer: Medicare Other

## 2021-10-12 DIAGNOSIS — I350 Nonrheumatic aortic (valve) stenosis: Secondary | ICD-10-CM

## 2021-10-12 DIAGNOSIS — I5021 Acute systolic (congestive) heart failure: Secondary | ICD-10-CM

## 2021-10-12 LAB — CBC
HCT: 31.7 % — ABNORMAL LOW (ref 36.0–46.0)
Hemoglobin: 10 g/dL — ABNORMAL LOW (ref 12.0–15.0)
MCH: 27.4 pg (ref 26.0–34.0)
MCHC: 31.5 g/dL (ref 30.0–36.0)
MCV: 86.8 fL (ref 80.0–100.0)
Platelets: 165 10*3/uL (ref 150–400)
RBC: 3.65 MIL/uL — ABNORMAL LOW (ref 3.87–5.11)
RDW: 16.1 % — ABNORMAL HIGH (ref 11.5–15.5)
WBC: 4.9 10*3/uL (ref 4.0–10.5)
nRBC: 0 % (ref 0.0–0.2)

## 2021-10-12 LAB — COMPREHENSIVE METABOLIC PANEL
ALT: 267 U/L — ABNORMAL HIGH (ref 0–44)
AST: 373 U/L — ABNORMAL HIGH (ref 15–41)
Albumin: 3.2 g/dL — ABNORMAL LOW (ref 3.5–5.0)
Alkaline Phosphatase: 81 U/L (ref 38–126)
Anion gap: 7 (ref 5–15)
BUN: 17 mg/dL (ref 8–23)
CO2: 31 mmol/L (ref 22–32)
Calcium: 8.3 mg/dL — ABNORMAL LOW (ref 8.9–10.3)
Chloride: 97 mmol/L — ABNORMAL LOW (ref 98–111)
Creatinine, Ser: 1.03 mg/dL — ABNORMAL HIGH (ref 0.44–1.00)
GFR, Estimated: 57 mL/min — ABNORMAL LOW (ref >60)
Glucose, Bld: 111 mg/dL — ABNORMAL HIGH (ref 70–99)
Potassium: 3.7 mmol/L (ref 3.5–5.1)
Sodium: 135 mmol/L (ref 135–145)
Total Bilirubin: 1.5 mg/dL — ABNORMAL HIGH (ref 0.3–1.2)
Total Protein: 5.3 g/dL — ABNORMAL LOW (ref 6.5–8.1)

## 2021-10-12 LAB — ECHOCARDIOGRAM COMPLETE
AR max vel: 0.42 cm2
AV Area VTI: 0.38 cm2
AV Area mean vel: 0.38 cm2
AV Mean grad: 53 mmHg
AV Peak grad: 92.2 mmHg
Ao pk vel: 4.8 m/s
Area-P 1/2: 4.36 cm2
Calc EF: 35.9 %
Height: 58 in
MV VTI: 1.19 cm2
P 1/2 time: 392 msec
S' Lateral: 4.3 cm
Single Plane A2C EF: 41.2 %
Single Plane A4C EF: 31 %
Weight: 1880.08 oz

## 2021-10-12 LAB — HEPARIN LEVEL (UNFRACTIONATED)
Heparin Unfractionated: 0.58 IU/mL (ref 0.30–0.70)
Heparin Unfractionated: 0.53 IU/mL (ref 0.30–0.70)

## 2021-10-12 MED ORDER — ALUM & MAG HYDROXIDE-SIMETH 200-200-20 MG/5ML PO SUSP: 15.0000 mL | Freq: Four times a day (QID) | ORAL | Status: DC | PRN

## 2021-10-12 MED ORDER — POTASSIUM CHLORIDE CRYS ER 20 MEQ PO TBCR
40.0000 meq | EXTENDED_RELEASE_TABLET | Freq: Every day | ORAL | Status: DC
Start: 2021-10-13 — End: 2021-10-17
  Administered 2021-10-13 – 2021-10-17 (×4): 40 meq via ORAL
  Filled 2021-10-12 (×4): qty 2

## 2021-10-12 NOTE — Progress Notes (Signed)
°  Echocardiogram 2D Echocardiogram has been performed.  Delcie Roch 10/12/2021, 11:52 AM

## 2021-10-12 NOTE — Progress Notes (Signed)
ANTICOAGULATION CONSULT NOTE - Follow Up Consult  Pharmacy Consult for heparin Indication:  Afib/MVR  Labs: Recent Labs    10/11/21 1341 10/11/21 1538 10/11/21 2114 10/12/21 0131  HGB 10.5*  --   --  10.0*  HCT 30.9*  --   --  31.7*  PLT 278  --   --  165  HEPARINUNFRC  --   --  0.27*  --   CREATININE  --  1.24*  --  1.03*     Assessment: 75yo female subtherapeutic on heparin with initial dosing while Coumadin on hold for TAVR planned on 12/27. Nursing reported no infusion issues or bleeding. Heparin level after dose increased is therapeutic at 0.58. Hg 10 and HCT 31.7. Platelets WNL. Will continue current rate and recheck in 6-8 hours.   Goal of Therapy:  Heparin level 0.3-0.7 units/ml   Plan:  Continue heparin at 900 units/hr Recheck heparin level in 6-8 hours  Thank you for allowing pharmacy to participate in this patient's care.  Enos Fling, PharmD PGY1 Pharmacy Resident 10/12/2021 9:40 AM Check [email or web address] for unit specific pharmacy number

## 2021-10-12 NOTE — Progress Notes (Signed)
ANTICOAGULATION CONSULT NOTE - Follow Up Consult  Pharmacy Consult for heparin Indication:  Afib/MVR  Labs: Recent Labs    10/11/21 1341 10/11/21 1538 10/11/21 2114 10/12/21 0131 10/12/21 0849 10/12/21 1744  HGB 10.5*  --   --  10.0*  --   --   HCT 30.9*  --   --  31.7*  --   --   PLT 278  --   --  165  --   --   HEPARINUNFRC  --   --  0.27*  --  0.58 0.53  CREATININE  --  1.24*  --  1.03*  --   --      Assessment: 75yo female subtherapeutic on heparin with initial dosing while Coumadin on hold for TAVR planned on 12/27.   Heparin level is therapeutic at 0.53. Hg 10 and HCT 31.7. Platelets WNL. No signs of bleeding noted.   Goal of Therapy:  Heparin level 0.3-0.7 units/ml   Plan:  Continue heparin at 900 units/hr Check heparin level and CBC in AM.   Thank you for allowing pharmacy to participate in this patient's care.  Georgeann Oppenheim, PharmD Pharmacy Resident 10/12/2021, 7:31 PM

## 2021-10-12 NOTE — Progress Notes (Signed)
ANTICOAGULATION CONSULT NOTE - Follow Up Consult  Pharmacy Consult for heparin Indication:  Afib/MVR  Labs: Recent Labs    10/11/21 1341 10/11/21 1538 10/11/21 2114  HGB 10.5*  --   --   HCT 30.9*  --   --   PLT 278  --   --   HEPARINUNFRC  --   --  0.27*  CREATININE  --  1.24*  --     Assessment: 75yo female subtherapeutic on heparin with initial dosing while Coumadin on hold; no infusion issues or signs of bleeding per RN.  Goal of Therapy:  Heparin level 0.3-0.7 units/ml   Plan:  Will increase heparin infusion by 2 units/kg/hr to 900 units/hr and check level in 8 hours.    Vernard Gambles, PharmD, BCPS  10/12/2021,12:14 AM

## 2021-10-12 NOTE — Progress Notes (Signed)
Progress Note  Patient Name: [name] [name] Date of Encounter: 10/12/2021  [facility] Cardiologist: Sherren Mocha, MD  Subjective   Incomplete I/O's but recorded as net -1.1 L yesterday.  BP 144/57 this morning.  Improvement in renal function (1.2 > 1.0) and liver function (AST 587 > 373, ALT 350 > 267).  Reports dyspnea improved today  Inpatient Medications    Scheduled Meds:  docusate sodium  100 mg Oral BID   furosemide  40 mg Intravenous BID   lactose free nutrition  237 mL Oral BID   levothyroxine  75 mcg Oral QAC breakfast   pantoprazole  40 mg Oral Daily   potassium chloride SA  40 mEq Oral BID   sodium chloride flush  3 mL Intravenous Q12H   Continuous Infusions:  sodium chloride     heparin 900 Units/hr (10/12/21 0040)   PRN Meds: sodium chloride, acetaminophen, albuterol, ALPRAZolam, baclofen, nitroGLYCERIN, oxyCODONE, sodium chloride flush, zolpidem   Vital Signs    Vitals:   10/11/21 1930 10/11/21 2353 10/12/21 0530 10/12/21 0830  BP: (!) 131/55 (!) 111/56 (!) 105/48 (!) 144/57  Pulse: 62 64 (!) 58 68  Resp: 19 18 (!) 21 20  Temp: 97.9 F (36.6 C) 98.5 F (36.9 C) 98.8 F (37.1 C) 98.2 F (36.8 C)  TempSrc: Oral Oral Oral Oral  SpO2: 99% 98% 99% 99%  Weight:   53.3 kg   Height:   4\' 10"  (1.473 m)     Intake/Output Summary (Last 24 hours) at 10/12/2021 1007 Last data filed at 10/12/2021 [id number] Gross per 24 hour  Intake 376.74 ml  Output 1700 ml  Net -1323.26 ml   Last 3 Weights 10/12/2021 10/11/2021 10/06/2021  Weight (lbs) 117 lb 8.1 oz 122 lb 121 lb 4.1 oz  Weight (kg) 53.3 kg 55.339 kg 55 kg      Telemetry    AV paced in 60s, occasional sinus rhythm- Personally Reviewed  ECG    No new EKG- Personally Reviewed  Physical Exam   GEN: No acute distress.   Neck: +JVD Cardiac: RRR, 3 out of 6 systolic murmur Respiratory: Expiratory wheezing GI: Soft, nontender, non-distended  MS: 1+ edema Neuro:  Nonfocal  Psych: Normal  affect   Labs    High Sensitivity Troponin:   Recent Labs  Lab 09/30/21 1103 09/30/21 1302  TROPONINIHS 28* 23*     Chemistry Recent Labs  Lab 10/05/21 1448 10/11/21 1538 10/12/21 0131  NA 130* 131* 135  K 3.7 3.4* 3.7  CL 90* 93* 97*  CO2 31 30 31   GLUCOSE 127* 118* 111*  BUN 12 20 17   CREATININE 1.10* 1.24* 1.03*  CALCIUM 9.1 8.7* 8.3*  PROT  --  5.9* 5.3*  ALBUMIN  --  3.5 3.2*  AST  --  587* 373*  ALT  --  350* 267*  ALKPHOS  --  92 81  BILITOT  --  1.4* 1.5*  GFRNONAA 53* 46* 57*  ANIONGAP 9 8 7     Lipids No results for input(s): CHOL, TRIG, HDL, LABVLDL, LDLCALC, CHOLHDL in the last 168 hours.  Hematology Recent Labs  Lab 10/06/21 0126 10/11/21 1341 10/12/21 0131  WBC 3.7* 4.8 4.9  RBC 3.88 3.64* 3.65*  HGB 11.0* 10.5* 10.0*  HCT 33.7* 30.9* 31.7*  MCV 86.9 84.9 86.8  MCH 28.4 28.8 27.4  MCHC 32.6 34.0 31.5  RDW 15.8* 16.1* 16.1*  PLT 145* 278 165   Thyroid  Recent Labs  Lab 10/11/21 1538  TSH 4.347    BNP Recent Labs  Lab 10/11/21 1544  BNP 2,466.3*    DDimer No results for input(s): DDIMER in the last 168 hours.   Radiology    DG Chest 2 View  Result Date: 10/11/2021 CLINICAL DATA:  Congestive heart failure EXAM: CHEST - 2 VIEW COMPARISON:  09/30/2021 FINDINGS: LEFT-sided pacemaker overlies normal cardiac silhouette. Patient rotated leftward. Sternotomy wires noted. LEFT pacer noted. Bilateral pleural effusions present. No pulmonary edema. No infiltrate. IMPRESSION: Cardiomegaly and bilateral pleural effusions Electronically Signed   By: Genevive Bi M.D.   On: 10/11/2021 16:35   US Abdomen Limited RUQ (LIVER/GB)  Result Date: 10/11/2021 CLINICAL DATA:  Transaminitis. EXAM: ULTRASOUND ABDOMEN LIMITED RIGHT UPPER QUADRANT COMPARISON:  10/02/2021. FINDINGS: Gallbladder: Surgically absent. Common bile duct: Diameter: 4.6 mm. Liver: No focal lesion identified. The liver has a slightly nodular border inferiorly. Increased parenchymal  echogenicity. Portal vein is patent on color Doppler imaging with normal direction of blood flow towards the liver. Other: A trace amount of free fluid is noted in the right upper quadrant. Small right pleural effusion. IMPRESSION: 1. Hepatic steatosis. The liver has a slightly nodular border inferiorly which may be associated with underlying cirrhosis. 2. Trace ascites. 3. Small right pleural effusion. 4. Status post cholecystectomy. Electronically Signed   By: Thornell Sartorius M.D.   On: 10/11/2021 22:49    Cardiac Studies     Patient Profile     75 y.o. female with a history of critical aortic stenosis, rheumatic heart disease, mechanical mitral valve replacement 20 years ago, PPM, atrial fibrillation who presents for direct admission for heparin bridge prior to TAVR planned on 10/14/2021  Assessment & Plan    Acute on chronic combined systolic and diastolic CHF: presented on 12/24 for scheduled admission for heparin bridging for TAVR on 12/27.  Noted to be significantly volume overloaded. -Continue IV Lasix 40 mg twice daily -Check echocardiogram  Severe aortic stenosis: TAVR planned for 1227 - Will need to optimize volume status prior to TAVR, continue diuresis as above.  Will be cautious to avoid overdiuresis given severe aortic stenosis  Mechanical mitral valve: Started on heparin drip in preparation for TAVR on 12/27   Transaminitis: Suspect due to hepatic congestion in setting of decompensated heart failure as above.  Will continue diuresis.  Right upper quadrant ultrasound showed hepatic steatosis with slightly nodular border inferiorly that may be associated with underlying cirrhosis  Paroxysmal atrial fibrillation: On heparin drip as above.  Currently in sinus rhythm.  Was discharged recently on digoxin.  Digoxin level undetectable on admission.  For questions or updates, please contact [facility] Please consult [email or web address] for contact info under         Signed, Little Ishikawa, MD  10/12/2021, 10:07 AM

## 2021-10-13 LAB — CBC
HCT: 34.7 % — ABNORMAL LOW (ref 36.0–46.0)
Hemoglobin: 11.1 g/dL — ABNORMAL LOW (ref 12.0–15.0)
MCH: 28.1 pg (ref 26.0–34.0)
MCHC: 32 g/dL (ref 30.0–36.0)
MCV: 87.8 fL (ref 80.0–100.0)
Platelets: 168 10*3/uL (ref 150–400)
RBC: 3.95 MIL/uL (ref 3.87–5.11)
RDW: 16.4 % — ABNORMAL HIGH (ref 11.5–15.5)
WBC: 5.2 10*3/uL (ref 4.0–10.5)
nRBC: 0 % (ref 0.0–0.2)

## 2021-10-13 LAB — SURGICAL PCR SCREEN
MRSA, PCR: NEGATIVE
Staphylococcus aureus: NEGATIVE

## 2021-10-13 LAB — COMPREHENSIVE METABOLIC PANEL
ALT: 225 U/L — ABNORMAL HIGH (ref 0–44)
AST: 223 U/L — ABNORMAL HIGH (ref 15–41)
Albumin: 3.4 g/dL — ABNORMAL LOW (ref 3.5–5.0)
Alkaline Phosphatase: 83 U/L (ref 38–126)
Anion gap: 11 (ref 5–15)
BUN: 13 mg/dL (ref 8–23)
CO2: 30 mmol/L (ref 22–32)
Calcium: 8.7 mg/dL — ABNORMAL LOW (ref 8.9–10.3)
Chloride: 96 mmol/L — ABNORMAL LOW (ref 98–111)
Creatinine, Ser: 0.96 mg/dL (ref 0.44–1.00)
GFR, Estimated: >60 mL/min (ref >60)
Glucose, Bld: 99 mg/dL (ref 70–99)
Potassium: 3.9 mmol/L (ref 3.5–5.1)
Sodium: 137 mmol/L (ref 135–145)
Total Bilirubin: 1.2 mg/dL (ref 0.3–1.2)
Total Protein: 6.3 g/dL — ABNORMAL LOW (ref 6.5–8.1)

## 2021-10-13 LAB — HEPARIN LEVEL (UNFRACTIONATED): Heparin Unfractionated: 0.68 IU/mL (ref 0.30–0.70)

## 2021-10-13 LAB — ABO/RH: ABO/RH(D): O POS

## 2021-10-13 LAB — PROTIME-INR
INR: 1.3 — ABNORMAL HIGH (ref 0.8–1.2)
Prothrombin Time: 15.8 seconds — ABNORMAL HIGH (ref 11.4–15.2)

## 2021-10-13 LAB — TYPE AND SCREEN
ABO/RH(D): O POS
Antibody Screen: NEGATIVE

## 2021-10-13 LAB — SARS CORONAVIRUS 2 BY RT PCR (HOSPITAL ORDER, PERFORMED IN ~~LOC~~ HOSPITAL LAB): SARS Coronavirus 2: NEGATIVE

## 2021-10-13 LAB — MAGNESIUM: Magnesium: 1.9 mg/dL (ref 1.7–2.4)

## 2021-10-13 MED ORDER — TEMAZEPAM 15 MG PO CAPS
15.0000 mg | ORAL_CAPSULE | Freq: Once | ORAL | Status: DC | PRN
  Filled 2021-10-13: qty 1

## 2021-10-13 MED ORDER — MAGNESIUM SULFATE 50 % IJ SOLN
40.0000 meq | INTRAMUSCULAR | Status: DC
  Filled 2021-10-13: qty 9.85

## 2021-10-13 MED ORDER — POTASSIUM CHLORIDE 2 MEQ/ML IV SOLN
80.0000 meq | INTRAVENOUS | Status: DC
  Filled 2021-10-13: qty 40

## 2021-10-13 MED ORDER — CHLORHEXIDINE GLUCONATE 0.12 % MT SOLN
15.0000 mL | Freq: Once | OROMUCOSAL | Status: AC
  Administered 2021-10-14: 05:00:00 15 mL via OROMUCOSAL
  Filled 2021-10-13: qty 15

## 2021-10-13 MED ORDER — BISACODYL 5 MG PO TBEC
5.0000 mg | DELAYED_RELEASE_TABLET | Freq: Every day | ORAL | Status: DC | PRN
  Administered 2021-10-13: 12:00:00 5 mg via ORAL
  Filled 2021-10-13: qty 1

## 2021-10-13 MED ORDER — CHLORHEXIDINE GLUCONATE CLOTH 2 % EX PADS
6.0000 | MEDICATED_PAD | Freq: Once | CUTANEOUS | Status: AC
  Administered 2021-10-14: 05:00:00 6 via TOPICAL

## 2021-10-13 MED ORDER — BISACODYL 5 MG PO TBEC
5.0000 mg | DELAYED_RELEASE_TABLET | Freq: Once | ORAL | Status: AC
  Administered 2021-10-13: 18:00:00 5 mg via ORAL
  Filled 2021-10-13: qty 1

## 2021-10-13 MED ORDER — DEXMEDETOMIDINE HCL IN NACL 400 MCG/100ML IV SOLN
0.1000 ug/kg/h | INTRAVENOUS | Status: AC
Start: 2021-10-14 — End: 2021-10-14
  Administered 2021-10-14: 08:00:00 53.8 ug via INTRAVENOUS
  Administered 2021-10-14: 08:00:00 1 ug/kg/h via INTRAVENOUS
  Filled 2021-10-13: qty 100

## 2021-10-13 MED ORDER — NOREPINEPHRINE 4 MG/250ML-% IV SOLN
0.0000 ug/min | INTRAVENOUS | Status: AC
  Administered 2021-10-14: 08:00:00 2 ug/min via INTRAVENOUS
  Filled 2021-10-13: qty 250

## 2021-10-13 MED ORDER — CEFAZOLIN SODIUM-DEXTROSE 2-4 GM/100ML-% IV SOLN
2.0000 g | INTRAVENOUS | Status: AC
  Administered 2021-10-14: 08:00:00 2 g via INTRAVENOUS
  Filled 2021-10-13: qty 100

## 2021-10-13 MED ORDER — HEPARIN 30,000 UNITS/1000 ML (OHS) CELLSAVER SOLUTION
Status: DC
  Filled 2021-10-13: qty 1000

## 2021-10-13 NOTE — H&P (Signed)
[address]       [place] [address]             [phone]      Cardiothoracic Surgery Admission History and Physical   Patient ID: PIA NANEZ  MRN: [id number]; DOB: [birth year]   Primary Care Provider: Ronita Hipps, MD  [facility] Cardiologist: Sherren Mocha, MD  [facility] Electrophysiologist: None  Reason for admission: severe aortic stenosis  History of Present Illness:  Ms. Winstanley lives alone in [place]. She has two daughters, one who lives locally. She takes care of all of her own ADLs, but this has become limited due to her health decline. Of note, she wears full dentures.  She had rheumatic fever at age 75 and went on to develop rheumatic heart disease. She ultimately underwent mechanical mitral valve replacement approximately 20 years ago at [facility]. She has tolerated long-term warfarin therapy well without any recent bleeding problems. She underwent permanent pacemaker placement (dual-chamber) in 2009 by Dr Caryl Comes. The patient's recent echocardiogram from July 22, 2021 demonstrates severe global LV systolic dysfunction with LVEF 30 to 35%, severe calcification and restriction of the aortic valve leaflets, mild to moderate aortic valve insufficiency, and critical aortic stenosis with a mean transvalvular gradient of 53 mmHg, peak systolic velocity of 4.5 m/s, and calculated aortic valve area less than 0.5 cm. She actually underwent TAVR evaluation at [facility], but did not follow through with completing her work-up there. She had CT angiography studies done earlier this year. She appeared to have access for transfemoral TAVR. She was then evaluated at [facility] but did not want to be treated at an academic center and requested referral to [facility]. She was seen by Dr. Burt Knack in the office on 08/22/21 and complained of shortness of breath and fatigue with low-level activity. She described progressive shortness of  breath and generalized fatigue over the past year, now with limitation during low-level activities. She can only walk short distances without feeling breathless.  She was then admitted 11/8-11/12 for influenza A and CHF. She was treated with IV lasix and Tamiflu. She had agitation and was noncompliant during this admission refusing care and being aggressive with staff. Pt refused further lasix stating she was dehydrated and home HCTZ was discontinued at discharge.  We attempted to bring her back into the office for re-evaluation with structural heart, but patient no showed and then ultimately refused an office visit saying she wanted to save money and wanted to just proceed with CT scans and appt with me. These were set up for 10/01/21 but, unfortunately, the patient was too volume overloaded to lay for scanning and she was directly admitted to the hospital for IV diuresis. Once able to lye flat CT scans were completed 10/02/21. Cardiac gated CTA of the heart revealed anatomical characteristics consistent with aortic stenosis suitable for treatment by transcatheter aortic valve replacement without any significant complicating features and CTA of the aorta and iliac vessels demonstrated what appear to be adequate pelvic vascular access to facilitate a transfemoral approach. Her coumadin was transitioned to heparin with plan to proceed with L/RHC on 10/03/21. INR 2.0 today.  Today she is seen laying in bed. She has no complaints. Breathing better. Mild LE edema. Laying on her side bc of back pain. She says she feels a little woozy from a pain pill.      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   Qualifier: Diagnosis of By: Caryl Comes, MD, Remus Blake    Rheumatic heart disease    With mitral valve replacement, tricuspid regurgitation moderate to severe, significant left atrial enlargment   TRICUSPID REGURGITATION 12/23/2010   Qualifier: Diagnosis of By: Caryl Comes, MD, Remus Blake    Unspecified essential hypertension 07/31/2010   Qualifier: Diagnosis of By: Caryl Comes, MD, [facility], Mack Guise         Past Surgical History:  Procedure Laterality Date   APPENDECTOMY     CARDIAC ELECTROPHYSIOLOGY STUDY AND ABLATION     Stent and pacemaker placement   CHOLECYSTECTOMY     CORONARY ANGIOPLASTY WITH STENT PLACEMENT Left 09/13/2015   Dr. Gwenith Spitz at [facility], LAD stent   MITRAL VALVE REPLACEMENT  2001   mechanical valve   OOPHORECTOMY     PACEMAKER INSERTION  11/20/2007   Dual chamber pacemaker implantation with rapid ventricular pacing   SALPINGECTOMY Bilateral    TONSILLECTOMY     Home Medications:         Prior to Admission medications   Medication Sig Start Date End Date Taking? Authorizing Provider  albuterol (VENTOLIN HFA) 108 (90 Base) MCG/ACT inhaler Inhale 2 puffs into the lungs every 6 (six) hours as needed for shortness of breath. 03/21/14  Yes [provider]  ALPRAZolam Duanne Moron) 1 MG tablet Take 0.5-1 mg by mouth 2 (two) times daily as needed for anxiety.   Yes [provider]  atorvastatin (LIPITOR) 80 MG tablet Take 80 mg by mouth at bedtime. 11/27/11  Yes [provider]  baclofen (LIORESAL) 20 MG tablet Take 10 mg by mouth every 8 (eight) hours as needed for muscle spasms. 01/03/21  Yes [provider]  candesartan (ATACAND) 8 MG tablet Take 4 mg by mouth daily as needed ("as long as the Diastolic number is not too low"). 06/02/21  Yes [provider]  DILT-XR 240  MG 24 hr capsule Take 1 capsule (240 mg total) by mouth daily. 08/30/21  Yes Mercy Riding, MD  furosemide (LASIX) 20 MG tablet Take 40 mg by mouth 2 (two) times daily. 09/21/21  Yes [provider]  KLOR-CON M20 20 MEQ tablet Take 40 mEq by mouth See admin instructions. Crush and mix 40 mEq with applesauce and eat once a day   Yes [provider]  lactose free nutrition (BOOST PLUS) LIQD Take 237 mLs by mouth 2 (two) times daily.   Yes [provider]  levothyroxine (SYNTHROID) 75 MCG tablet Take 75 mcg by mouth daily before breakfast. 03/12/21 10/31/21 Yes [provider]  nitroGLYCERIN (NITROSTAT) 0.4 MG SL tablet Place 0.4 mg under the tongue every 5 (five) minutes as needed for chest pain. 01/14/21  Yes [provider]  warfarin (COUMADIN) 5 MG tablet Take 2 tablets (10 mg total) by mouth daily for 1 day, THEN 1.5 tablets (7.5 mg total) daily.  Patient taking differently: Take 7.5 mg by mouth at bedtime unless  INR is 2.5-3.5, then adjust as directed 08/30/21 11/29/21 Yes Gonfa, Taye T, MD  zolpidem (AMBIEN) 10 MG tablet Take 5-10 mg by mouth at bedtime. 11/27/11  Yes [provider]  Inpatient Medications:  Scheduled Meds:   atorvastatin 80 mg Oral QHS   docusate sodium 100 mg Oral BID   furosemide 40 mg Intravenous BID   levothyroxine 75 mcg Oral QAC breakfast   mouth rinse 15 mL Mouth Rinse BID   potassium chloride 40 mEq Oral BID   sodium chloride flush 3 mL Intravenous Q12H   zolpidem 5 mg Oral QHS   Continuous Infusions:   heparin 750 Units/hr (10/02/21 0927)   PRN Meds:  acetaminophen **OR** acetaminophen, ALPRAZolam, baclofen, bisacodyl, hydrALAZINE, morphine injection, oxyCODONE, polyethylene glycol, traZODone  Allergies:       Allergies  Allergen Reactions   Atenolol Other (See Comments)    HR and pulse dropped   Metoprolol Succinate [Metoprolol] Other (See Comments)    HR and pulse dropped   Baclofen Anxiety and Other (See  Comments)    Makes the patient feel anxious the day after taking this   Latex Rash   Social History:  Social History        Socioeconomic History   Marital status: Single    Spouse name: Not on file   Number of children: Not on file   Years of education: Not on file   Highest education level: Not on file  Occupational History   Not on file  Tobacco Use   Smoking status: Never   Smokeless tobacco: Never  Substance and Sexual Activity   Alcohol use: Never   Drug use: Never   Sexual activity: Not on file  Other Topics Concern   Not on file  Social History Narrative   Not on file   Social Determinants of Health   Financial Resource Strain: Not on file  Food Insecurity: Not on file  Transportation Needs: Not on file  Physical Activity: Not on file  Stress: Not on file  Social Connections: Not on file  Intimate Partner Violence: Not on file   Family History:       Family History  Problem Relation Age of Onset   Leukemia Mother    Stroke Mother    Heart disease Father    Stroke Maternal Grandmother    Heart attack Paternal Grandmother    ROS:  Please see the history of present illness.  All other ROS reviewed and negative.  Physical Exam/Data:         Vitals:   10/01/21 2031 10/02/21 0001 10/02/21 0557 10/02/21 0807  BP: (!) 115/53 (!) 103/53 (!) 117/50 (!) 123/53  Pulse: 61 (!) 59 63 60  Resp: 15 (!) 24 15 18   Temp: 98.6 F (37 C) 98.3 F (36.8 C) 97.6 F (36.4 C) 98.6 F (37 C)  TempSrc: Oral Oral Oral Oral  SpO2: 97% 92% 93% 92%  Weight:  54.4 kg      Intake/Output Summary (Last 24 hours) at 10/02/2021 0952  Last data filed at 10/02/2021 0536     Gross per 24 hour  Intake 945 ml  Output 1450 ml  Net -505 ml   Last 3 Weights 10/02/2021 08/30/2021 08/28/2021  Weight (lbs) 120 lb 114 lb 10.2 oz 117 lb 15.1 oz  Weight (kg) 54.432 kg 52 kg 53.5 kg   Body mass index is 25.08 kg/m.  General: Well nourished, well developed, in no acute distress   HEENT: normal  Lymph: no adenopathy  Neck: no JVD  Endocrine: No thyromegaly  Cardiac: normal S1, S2; RRR; harsh SEM, mechanical click at apex  Lungs: clear to auscultation bilaterally, no wheezing, rhonchi or rales  Abd: soft, nontender, no hepatomegaly  Ext: mi  Musculoskeletal: No deformities, BUE and BLE strength normal and equal  Skin: warm and dry  Neuro: CNs 2-12 intact, no focal abnormalities noted  Psych: Normal affect  EKG: The EKG was personally reviewed and demonstrates: AV paced with wide LBBB.  Telemetry: Telemetry was personally reviewed and demonstrates: paced with PVCs  Relevant CV Studies:  Echo outside films: 07/22/21  Echo:  SUMMARY  The left ventricular size is normal.  There is moderate concentric left ventricular hypertrophy.  Left ventricular systolic function is moderately reduced.  LV ejection fraction = 35-40%.  Abnormal (paradoxical) septal motion consistent with RV pacemaker.  There are regional wall motion abnormalities as specified below.  There is severe hypokinesis and thinning of the anterior,  anteroseptal, inferoseptal territory from mid to apex.  The right ventricle is normal in size and function.  The left atrium is severely dilated.  Diffuse thickening of the aortic valve with restricted cusp opening.  There is critically severe aortic stenosis.  There is moderate aortic regurgitation.  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  There is moderate tricuspid regurgitation. Moderate pulmonary  hypertension.  The aortic Sinus(es) of Valsalva are borderline dilated.  The IVC is normal in size with an inspiratory collapse of greater then  50%, suggesting normal right atrial pressure.  There is no pericardial effusion.  Compared to prior study dated 12/30/20, AS is now critically severe.   -  FINDINGS:  LEFT VENTRICLE  The left ventricular size is normal. Upper septal hypertrophy (sigmoid  septum), normal variant. There is moderate concentric left  ventricular  hypertrophy. Left ventricular systolic function is moderately reduced.  LV ejection fraction = 35-40%. LV Global L Strain =-8.3%. There is  severe [Grade III] diastolic dysfunction [restrictive filling  pattern], with elevated left atrial pressure. Mitral inflow  deceleration timeAbnormal<150 msec. Consistent with restrictive  physiology.. Abnormal (paradoxical) septal motion consistent with RV  pacemaker. There are regional wall motion abnormalities as specified  below. There is severe hypokinesis and thinning of the anterior,  anteroseptal, inferoseptal territory from mid to apex.   -  RIGHT VENTRICLE  The right ventricle is normal in size and function.   LEFT ATRIUM  The left atrium is severely dilated. LAVI 111.0 ml/m2.   RIGHT ATRIUM  Right atrial size is normal.  -  AORTIC VALVE  The aortic valve is trileaflet. Diffuse thickening of the aortic valve  with restricted cusp opening. There is critically severe aortic  stenosis. There is moderate aortic regurgitation. The calculated  aortic valve area using the continuity equation is 0.4 cm2. Aortic  valve mean pressure gradient is 53 mmHg. The peak aortic valve  velocity 449 cm/s.  -  MITRAL VALVE  There is no mitral regurgitation noted. The mean gradient across the  mitral valve is 3.5 mmHg. The heart rate for the mean mitral valve  gradient is 61 BPM. 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  -  TRICUSPID VALVE  Structurally normal tricuspid valve. There is moderate tricuspid  regurgitation. Estimated right ventricular systolic pressure is 50  mmHg. Moderate pulmonary hypertension.  -  PULMONIC VALVE  Structurally normal pulmonic valve. Trace pulmonic valvular  regurgitation.  -  ARTERIES  The aortic Sinus(es) of Valsalva are borderline dilated. The ascending  aorta is normal size. Mild pulmonary artery dilation. Measures 3.2 cm.   -  VENOUS  Pulmonary venous flow pattern not well visualized. The IVC is  normal  in size with an inspiratory collapse of greater then 50%, suggesting  normal right atrial pressure.  -  EFFUSION  There is no pericardial effusion.  -  -   MMode/2D Measurements & Calculations  IVSd: 1.7 cm LA dim: 6.0 cm ESV(MOD-sp4): Ao sinus diam:  LVIDd: 5.2 cm EDV(MOD-sp4): 75.2 ml 3.8 cm  LVPWd: 1.3 cm 121.0 ml EDV(MOD-sp2):  LVIDs: 3.7 cm 169.0 ml  ESV(MOD-sp2):  113.0 ml   _______________________________________________________________________  asc Aorta Diam: LVOT diam: 2.0 cm SV(MOD-sp4): EF A4C: 37.9 %  3.6 cm 45.8 ml  SI(MOD-sp4):  29.8 ml/m2   _______________________________________________________________________  LA area A2: LA area A4: LA ESV (BP): LA ESV Index  40.9 cm2 41.2 cm2 171.0 ml (A2C): 109.1 ml/m2    _______________________________________________________________________  LA ESV Index LA ESV Index (BP):LA vol: LA vol index:  (A4C): 111.0 ml/m2 111.0 ml/m2 183.4 ml 119.4 ml/m2    _______________________________________________________________________  SV A4C: 45.8 ml   Doppler Measurements & Calculations  MV E max vel: MV V2 max: MV P1/2t max IN:[id number]):  138.0 cm/sec 166.8 cm/sec 162.4 cm/sec 45.3 ml  MV A max vel: MV max PG: MV P1/2t: Ao V2 max:  59.2 cm/sec 11.1 mmHg 58.5 msec 491.2 cm/sec  MV E/A: 2.3 MV V2 mean: MV dec time: Ao max PG:  Med Peak E' Vel: 77.0 cm/sec 0.12 sec 96.5 mmHg  4.8 cm/sec MV mean PG: Ao V2 mean:  Lat Peak E' Vel: 3.0 mmHg 352.9 cm/sec  12.5 cm/sec MV V2 VTI: Ao mean PG:  E/Lat E`: 11.0 37.9 cm 57.4 mmHg  E/Med E`: 28.8 MVA(P1/2t): Ao V2 VTI:  3.8 cm2 144.2 cm  MVA(VTI): 1.2 cm2 AVA (VTI):  0.31 cm2   _______________________________________________________________________  AI max vel: LV V1 VTI: TR max vel: RAP systole:  401.4 cm/sec 14.7 cm 342.1 cm/sec 3.0 mmHg  AI dec slope: TR max PG:  335.9 cm/sec2 46.8 mmHg  AI P1/2t: RVSP(TR):  350.0 msec 49.8 mmHg     _______________________________________________________________________  AS Dimensionless AVAi(VTI) SV index(LVOT):  Index (VTI): 0.10 cm^2/m^2:  29.5 ml/m2  0.20 cm2  _____________________________  Cardiac CT 10/02/2021  CLINICAL DATA: Aortic stenosis  EXAM:  Cardiac TAVR CT  TECHNIQUE:  The patient was scanned on a Siemens Force [id number] slice scanner. A 120  kV retrospective scan was triggered in the descending thoracic aorta  at 111 HU's. Gantry rotation speed was 270 msecs and collimation was  .9 mm. No beta blockade or nitro were given. The 3D data set was  reconstructed in 5% intervals of the R-R cycle. Systolic and  diastolic phases were analyzed on a dedicated work station using  MPR, MIP and VRT modes. The patient received 80 cc of contrast.  FINDINGS:  Aortic Valve: Tri leaflet calcified with restricted leaflet motion  Calcium score > 5000  Aorta: Heavily calcified mildly dilated ascending thoracic aorta  severe calcific atherosclerosis  Sinotubular Junction: 27 mm  Ascending Thoracic Aorta: 39 mm  Descending Thoracic Aorta: 24 mm  Sinus of Valsalva Measurements:  Non-coronary: 34.8 mm  Right - coronary: 33.6 mm  Left - coronary: 32.4 mm  Coronary Artery Height above Annulus:  Left Main: 11.7 mm above annulus  Right Coronary: 11.1 mm above annulus  Virtual Basal Annulus Measurements:  Maximum/Minimum Diameter: 27 mm x 19.4 mm  Perimeter: 77.6  mm  Area: 441 mm2 30% phase  Coronary Arteries: Sufficient height above annulus for deployment  Optimum Fluoroscopic Angle for Delivery: LAO 12 Caudal 12 degrees  IMPRESSION:  1. Calcified tri-leaflet AV with score >5,000  2. Annular area of 441 mm2 suitable for a 26 mm Sapien 3 valve  3. Normal appearing bileaflet mechanical MVR with no pannus and good  opening /closing angles  4. Mixing artifact in LAA no delayed images done  5. Coronary arteries sufficient height above annulus for deployment  6. Optimum angiographic  angle for deployment LAO 12 Caudal 12  degrees  7. Patient appearing stent in LAD  8. Pacing wires in RA/RV  9. Dilated main PA 4.1 cm suggesting elevated PA systolic pressures  Jenkins Rouge  Electronically Signed  By: Jenkins Rouge M.D.  On: 10/02/2021 09:41  _____________________  CT abdomen/chest/pelvis 10/02/21  Narrative & Impression  CLINICAL DATA: 75 year old female with history of severe aortic  stenosis. Preprocedural study prior to potential transcatheter  aortic valve replacement (TAVR) procedure.  EXAM:  CT ANGIOGRAPHY CHEST, ABDOMEN AND PELVIS  TECHNIQUE:  Multidetector CT imaging through the chest, abdomen and pelvis was  performed using the standard protocol during bolus administration of  intravenous contrast. Multiplanar reconstructed images and MIPs were  obtained and reviewed to evaluate the vascular anatomy.  CONTRAST: 132mL OMNIPAQUE IOHEXOL 350 MG/ML SOLN  COMPARISON: None.  FINDINGS:  CTA CHEST FINDINGS  Cardiovascular: Heart size is severely enlarged. There is no  significant pericardial fluid, thickening or pericardial  calcification. There is aortic atherosclerosis, as well as  atherosclerosis of the great vessels of the mediastinum and the  coronary arteries, including calcified atherosclerotic plaque in the  left main, left anterior descending, left circumflex and right  coronary arteries. Myocardial thinning and subendocardial  hypoattenuation throughout the mid to distal LAD territory, likely  sequela of prior LAD territory myocardial infarction(s). This is  associated with some mild aneurysmal dilatation of the left  ventricular apex. Severe dilatation of the pulmonic trunk (4.3 cm in  diameter), concerning for pulmonary arterial hypertension. Severe  thickening calcification of the aortic valve. Status post mitral  valve replacement with mechanical mitral valve. Calcifications of  the mitral annulus. Left-sided pacemaker device in place with lead   tips terminating in the right atrial appendage and right ventricular  apex.  Mediastinum/Lymph Nodes: No pathologically enlarged mediastinal or  hilar lymph nodes. Hilar esophagus is unremarkable in appearance. No  axillary lymphadenopathy.  Lungs/Pleura: Small bilateral pleural effusions (right greater than  left) lying dependently. Scattered areas of subsegmental atelectasis  are noted in the lower lungs, along with near complete atelectasis  of the right middle lobe. There is a background of mild diffuse  ground-glass attenuation and interlobular septal thickening  throughout the lungs bilaterally, suggesting the presence of mild  interstitial pulmonary edema.  Musculoskeletal/Soft Tissues: Median sternotomy wires. There are no  aggressive appearing lytic or blastic lesions noted in the  visualized portions of the skeleton.  CTA ABDOMEN AND PELVIS FINDINGS  Hepatobiliary: Heterogeneous attenuation throughout the hepatic  parenchyma, with large areas of low attenuation most evident in the  right lobe of the liver, favored to reflect a background of  heterogeneous hepatic steatosis. No definite focal cystic or solid  hepatic lesions. No intra or extrahepatic biliary ductal dilatation.  Status post cholecystectomy.  Pancreas: No pancreatic mass. No pancreatic ductal dilatation. No  pancreatic or peripancreatic fluid collections or inflammatory  changes.  Spleen: Small splenule inferior to the spleen.  Otherwise,  unremarkable.  Adrenals/Urinary Tract: Mild multifocal cortical thinning in both  kidneys. Subcentimeter low-attenuation lesion in the upper pole of  the left kidney, too small to characterize, but statistically likely  to represent a tiny cyst. Bilateral adrenal glands are normal in  appearance. No hydroureteronephrosis. Urinary bladder is normal in  appearance.  Stomach/Bowel: The appearance of the stomach is normal. There is no  pathologic dilatation of small bowel or  colon. The appendix is not  confidently identified and may be surgically absent. Regardless,  there are no inflammatory changes noted adjacent to the cecum to  suggest the presence of an acute appendicitis at this time.  Vascular/Lymphatic: Aortic atherosclerosis, with vascular findings  and measurements pertinent to potential TAVR procedure, as detailed  below. No aneurysm or dissection noted in the abdominal or pelvic  vasculature. No lymphadenopathy identified in the abdomen or pelvis.  Reproductive: Uterus and ovaries are atrophic.  Other: No significant volume of ascites. No pneumoperitoneum.  Musculoskeletal: There are no aggressive appearing lytic or blastic  lesions noted in the visualized portions of the skeleton.  VASCULAR MEASUREMENTS PERTINENT TO TAVR:  AORTA:  Minimal Aortic Diameter-13 x 14 mm  Severity of Aortic Calcification-moderate to severe  RIGHT PELVIS:  Right Common Iliac Artery -  Minimal Diameter-7.7 x 7.1 mm  Tortuosity-mild  Calcification-moderate  Right External Iliac Artery -  Minimal Diameter-7.1 x 7.1 mm  Tortuosity-mild-to-moderate  Calcification-none  Right Common Femoral Artery -  Minimal Diameter-6.5 x 6.3 mm  Tortuosity-mild  Calcification-none  LEFT PELVIS:  Left Common Iliac Artery -  Minimal Diameter-8.2 x 8.7 mm  Tortuosity-mild  Calcification-moderate  Left External Iliac Artery -  Minimal Diameter-6.3 x 7.2 mm  Tortuosity-mild  Calcification-none  Left Common Femoral Artery -  Minimal Diameter-6.8 x 6.8 mm  Tortuosity-mild  Calcification-none  Review of the MIP images confirms the above findings.  IMPRESSION:  1. Vascular findings and measurements pertinent to potential TAVR  procedure, as detailed above.  2. Severe thickening calcification of the aortic valve, compatible  with reported clinical history of severe aortic stenosis.  3. Cardiomegaly with findings suggestive of congestive heart  failure, including evidence of mild  interstitial pulmonary edema and  small bilateral pleural effusions, as detailed above.  4. Aortic atherosclerosis, in addition to left main and 3 vessel  coronary artery disease. There is also evidence of prior LAD  territory myocardial infarction(s), as above.  5. Severe dilatation of the pulmonic trunk (4.3 cm in diameter),  concerning for pulmonary arterial hypertension.  6. Heterogeneous hepatic steatosis.  7. Additional incidental findings, as above.  Laboratory Data:  High Sensitivity Troponin:  Last Labs       Recent Labs  Lab 09/30/21  1103 09/30/21  1302  TROPONINIHS 28* 23*  Chemistry  Last Labs        Recent Labs  Lab 09/30/21  1103 10/01/21  0400 10/02/21  0241  NA 133* 132* 135  K 4.6 2.8* 3.5  CL 99 97* 100  CO2 24 26 24   GLUCOSE 117* 90 111*  BUN 18 15 14   CREATININE 1.27* 1.14* 1.14*  CALCIUM 9.2 8.5* 8.5*  GFRNONAA 44* 51* 51*  ANIONGAP 10 9 11    Last Labs      Recent Labs  Lab 09/30/21  1302  PROT 7.0  ALBUMIN 4.1  AST 26  ALT 17  ALKPHOS 84  BILITOT 1.3*  Hematology  Last Labs       Recent Labs  Lab 09/30/21  1103 10/01/21  0400  WBC 6.3 4.7  RBC 4.28 3.76*  HGB 12.1 10.7*  HCT 38.3 33.5*  MCV 89.5 89.1  MCH 28.3 28.5  MCHC 31.6 31.9  RDW 16.3* 16.2*  PLT 189 147*  BNP  Last Labs      Recent Labs  Lab 09/30/21  1103  BNP 2,815.3*  DDimer  Last Labs   No results for input(s): DDIMER in the last 168 hours.  Radiology/Studies:  DG Chest 2 View  Result Date: 09/30/2021  CLINICAL DATA: Shortness of breath EXAM: CHEST - 2 VIEW COMPARISON: 08/26/2021 FINDINGS: Cardiomegaly status post median sternotomy with mitral annulus prosthesis. Left chest multi lead pacer. Small, layering bilateral pleural effusions and associated atelectasis or consolidation. Exaggerated thoracic kyphosis. Disc degenerative disease of the thoracic spine. IMPRESSION: 1. Cardiomegaly. 2. Small, layering bilateral pleural effusions and associated  atelectasis or consolidation. Electronically Signed By: Delanna Ahmadi M.D. On: 09/30/2021 11:27  DG Abdomen 1 View  Result Date: 09/30/2021  CLINICAL DATA: Shortness of breath, LEFT lower quadrant abdominal pain, unable to urinate since yesterday, constipation EXAM: ABDOMEN - 1 VIEW COMPARISON: 02/27/2014 FINDINGS: Nonobstructive bowel gas pattern. No bowel dilatation or bowel wall thickening. Bones demineralized with degenerative facet disease changes lower lumbar spine. Surgical clips RIGHT upper quadrant. No urinary tract calcifications. IMPRESSION: No acute abnormalities. Electronically Signed By: Lavonia Dana M.D. On: 09/30/2021 11:29  CT CORONARY MORPH W/CTA COR W/SCORE W/CA W/CM &/OR WO/CM  Addendum Date: 10/02/2021  ADDENDUM REPORT: 10/02/2021 09:41 CLINICAL DATA: Aortic stenosis EXAM: Cardiac TAVR CT TECHNIQUE: The patient was scanned on a Siemens Force [id number] slice scanner. A 120 kV retrospective scan was triggered in the descending thoracic aorta at 111 HU's. Gantry rotation speed was 270 msecs and collimation was .9 mm. No beta blockade or nitro were given. The 3D data set was reconstructed in 5% intervals of the R-R cycle. Systolic and diastolic phases were analyzed on a dedicated work station using MPR, MIP and VRT modes. The patient received 80 cc of contrast. FINDINGS: Aortic Valve: Tri leaflet calcified with restricted leaflet motion Calcium score > 5000 Aorta: Heavily calcified mildly dilated ascending thoracic aorta severe calcific atherosclerosis Sinotubular Junction: 27 mm Ascending Thoracic Aorta: 39 mm Descending Thoracic Aorta: 24 mm Sinus of Valsalva Measurements: Non-coronary: 34.8 mm Right - coronary: 33.6 mm Left - coronary: 32.4 mm Coronary Artery Height above Annulus: Left Main: 11.7 mm above annulus Right Coronary: 11.1 mm above annulus Virtual Basal Annulus Measurements: Maximum/Minimum Diameter: 27 mm x 19.4 mm Perimeter: 77.6 mm Area: 441 mm2 30% phase Coronary Arteries: Sufficient  height above annulus for deployment Optimum Fluoroscopic Angle for Delivery: LAO 12 Caudal 12 degrees IMPRESSION: 1. Calcified tri-leaflet AV with score >5,000 2. Annular area of 441 mm2 suitable for a 26 mm Sapien 3 valve 3. Normal appearing bileaflet mechanical MVR with no pannus and good opening /closing angles 4. Mixing artifact in LAA no delayed images done 5. Coronary arteries sufficient height above annulus for deployment 6. Optimum angiographic angle for deployment LAO 12 Caudal 12 degrees 7. Patient appearing stent in LAD 8. Pacing wires in RA/RV 9. Dilated main PA 4.1 cm suggesting elevated PA systolic pressures Jenkins Rouge Electronically Signed By: Jenkins Rouge M.D. On: 10/02/2021 09:41  Result Date: 10/02/2021  EXAM: OVER-READ INTERPRETATION CT CHEST The following report is an over-read performed by radiologist Dr. Vinnie Langton of [facility] [place] on 10/02/2021. This over-read does not include interpretation of cardiac or coronary anatomy or pathology. The coronary  calcium score/coronary CTA interpretation by the cardiologist is attached. COMPARISON: Chest CT 07/22/2021. FINDINGS: Extracardiac findings will be described separately under dictation for contemporaneously obtained CTA chest, abdomen and pelvis. IMPRESSION: Please see separate dictation for contemporaneously obtained CTA chest, abdomen and pelvis dated 10/02/2021 for full description of relevant extracardiac findings. Electronically Signed: By: Vinnie Langton M.D. On: 10/02/2021 09:14    STS score  Procedure: Isolated AVR  Risk of Mortality:  4.169%  Renal Failure:  2.054%  Permanent Stroke:  0.725%  Prolonged Ventilation:  13.176%  DSW Infection:  0.143%  Reoperation:  4.706%  Morbidity or Mortality:  17.897%  Short Length of Stay:  14.359%  Long Length of Stay:  9.243%  ________________________  [place]  [facility]CCQ-12 08/22/2021  1 a. Ability to shower/bathe Not at all  limited  1 b. Ability to walk 1 block Moderately limited  1 c. Ability to hurry/jog Extremely limited  2. Edema feet/ankles/legs 3+ times a week, not every day  3. Limited by fatigue 1-2 times a week  4. Limited by dyspnea Several times a day  5. Sitting up / on 3+ pillows Never over the past 2 weeks  6. Limited enjoyment of life Moderately limited  7. Rest of life w/ symptoms Somewhat satisfied  8 a. Participation in hobbies Limited quite a bit  8 b. Participation in chores Moderately limited  8 c. Visiting family/friends Moderately limited   Assessment and Plan:    The patient is a 75 year old woman with a history of rheumatic heart disease status post mechanical mitral valve replacement about 20 years ago at [facility] by Dr. Anne Shutter. She has been treated with long-term Coumadin for her mechanical valve and PAF. She had a permanent pacemaker placed in 2009. She also has a history of coronary disease with MI and prior stenting. She now presents with progressive exertional shortness of breath and fatigue as well as lower extremity edema. Her most recent echo in October 2022 showed reduced ejection fraction of 30 to 35%. The aortic valve was severely calcified with restricted leaflet mobility with a mean gradient of 53 mmHg and a valve area of 0.5 cm consistent with critical aortic stenosis. There was mild to moderate aortic insufficiency. She started her TAVR work-up and was supposed to see me yesterday in the office after her scans but was unable to lay flat on the CT scan table and was felt to be volume overloaded and admitted for intravenous diuresis. She feels much better. Her gated cardiac CTA this morning shows anatomy suitable for a 26 mm SAPIEN 3 valve. There was a patent stent in the LAD. There is diffuse calcification of the ascending aorta particularly medially that extends from the sinotubular junction up to the aortic arch. Her abdominal and pelvic CTA shows adequate pelvic vascular  anatomy to allow transfemoral insertion. She is scheduled for cardiac catheterization tomorrow. I agree that aortic valve replacement is indicated in this patient for relief of her symptoms and to prevent further left ventricular deterioration. I do not think she is a candidate for open surgical AVR given the diffuse calcification of her ascending aorta as well as her age with prior mitral valve replacement. I think TAVR would be a reasonable alternative for treating her.  The patient was counseled at length regarding treatment alternatives for management of severe symptomatic aortic stenosis. The risks and benefits of surgical intervention has been discussed in detail. Long-term prognosis with medical therapy was discussed. Alternative approaches such as conventional  surgical aortic valve replacement, transcatheter aortic valve replacement, and palliative medical therapy were compared and contrasted at length. This discussion was placed in the context of the patient's own specific clinical presentation and past medical history. All of her questions have been addressed.  Following the decision to proceed with transcatheter aortic valve replacement, a discussion was held regarding what types of management strategies would be attempted intraoperatively in the event of life-threatening complications, including whether or not the patient would be considered a candidate for the use of cardiopulmonary bypass and/or conversion to open sternotomy for attempted surgical intervention. She is not a candidate for emergent sternotomy to manage any intraoperative complications. The patient is aware of the fact that transient use of cardiopulmonary bypass may be necessary. The patient has been advised of a variety of complications that might develop including but not limited to risks of death, stroke, paravalvular leak, aortic dissection or other major vascular complications, aortic annulus rupture, device embolization, cardiac  rupture or perforation, mitral regurgitation, acute myocardial infarction, arrhythmia, heart block or bradycardia requiring permanent pacemaker placement, congestive heart failure, respiratory failure, renal failure, pneumonia, infection, other late complications related to structural valve deterioration or migration, or other complications that might ultimately cause a temporary or permanent loss of functional independence or other long term morbidity. The patient provides full informed consent for the procedure as described and all questions were answered.   Alleen Borne, MD

## 2021-10-13 NOTE — Progress Notes (Signed)
Progress Note  Patient Name: [name] [name] Date of Encounter: 10/13/2021  [facility] Cardiologist: Sherren Mocha, MD  Subjective   Net -1.3 L yesterday, -2.4 L on admission.  BP 124/50 this morning.  Stable creatinine at 0.96 today.  LFTs improving (AST 587 > 373 > 223).  Reports dyspnea improved but continues to feel short of breath  Inpatient Medications    Scheduled Meds:  docusate sodium  100 mg Oral BID   furosemide  40 mg Intravenous BID   lactose free nutrition  237 mL Oral BID   levothyroxine  75 mcg Oral QAC breakfast   [START ON 10/14/2021] magnesium sulfate  40 mEq Other To OR   pantoprazole  40 mg Oral Daily   [START ON 10/14/2021] potassium chloride  80 mEq Other To OR   potassium chloride SA  40 mEq Oral Daily   sodium chloride flush  3 mL Intravenous Q12H   Continuous Infusions:  sodium chloride     [START ON 10/14/2021]  ceFAZolin (ANCEF) IV     [START ON 10/14/2021] dexmedetomidine     [START ON 10/14/2021] heparin 30,000 units/NS 1000 mL solution for CELLSAVER     heparin 900 Units/hr (10/12/21 1833)   [START ON 10/14/2021] norepinephrine (LEVOPHED) Adult infusion     PRN Meds: sodium chloride, acetaminophen, albuterol, ALPRAZolam, alum & mag hydroxide-simeth, baclofen, nitroGLYCERIN, oxyCODONE, sodium chloride flush, zolpidem   Vital Signs    Vitals:   10/12/21 1940 10/12/21 2254 10/13/21 0415 10/13/21 0802  BP: (!) 119/38 111/85 (!) 113/32 (!) 124/50  Pulse: 64 71 64 (!) 56  Resp: 16 18 20  (!) 21  Temp: 98.2 F (36.8 C) 98.3 F (36.8 C) 98.2 F (36.8 C) 98.2 F (36.8 C)  TempSrc: Oral Oral Oral Oral  SpO2: 100% 97% 99% 96%  Weight:   53.8 kg   Height:        Intake/Output Summary (Last 24 hours) at 10/13/2021 1015 Last data filed at 10/13/2021 0600 Gross per 24 hour  Intake 333.74 ml  Output 1550 ml  Net -1216.26 ml    Last 3 Weights 10/13/2021 10/12/2021 10/11/2021  Weight (lbs) 118 lb 9.7 oz 117 lb 8.1 oz 122 lb   Weight (kg) 53.8 kg 53.3 kg 55.339 kg      Telemetry    AV paced in 60s, occasional sinus rhythm- Personally Reviewed  ECG    No new EKG- Personally Reviewed  Physical Exam   GEN: No acute distress.   Neck: +JVD Cardiac: RRR, 3 out of 6 systolic murmur Respiratory: Crackles at R base GI: Soft, nontender, non-distended  MS: 1+ edema Neuro:  Nonfocal  Psych: Normal affect   Labs    High Sensitivity Troponin:   Recent Labs  Lab 09/30/21 1103 09/30/21 1302  TROPONINIHS 28* 23*      Chemistry Recent Labs  Lab 10/11/21 1538 10/12/21 0131 10/13/21 0302  NA 131* 135 137  K 3.4* 3.7 3.9  CL 93* 97* 96*  CO2 30 31 30   GLUCOSE 118* 111* 99  BUN 20 17 13   CREATININE 1.24* 1.03* 0.96  CALCIUM 8.7* 8.3* 8.7*  PROT 5.9* 5.3* 6.3*  ALBUMIN 3.5 3.2* 3.4*  AST 587* 373* 223*  ALT 350* 267* 225*  ALKPHOS 92 81 83  BILITOT 1.4* 1.5* 1.2  GFRNONAA 46* 57* >60  ANIONGAP 8 7 11      Lipids No results for input(s): CHOL, TRIG, HDL, LABVLDL, LDLCALC, CHOLHDL in the last 168 hours.  Hematology  Recent Labs  Lab 10/11/21 1341 10/12/21 0131 10/13/21 0302  WBC 4.8 4.9 5.2  RBC 3.64* 3.65* 3.95  HGB 10.5* 10.0* 11.1*  HCT 30.9* 31.7* 34.7*  MCV 84.9 86.8 87.8  MCH 28.8 27.4 28.1  MCHC 34.0 31.5 32.0  RDW 16.1* 16.1* 16.4*  PLT 278 165 168    Thyroid  Recent Labs  Lab 10/11/21 1538  TSH 4.347     BNP Recent Labs  Lab 10/11/21 1544  BNP 2,466.3*     DDimer No results for input(s): DDIMER in the last 168 hours.   Radiology    DG Chest 2 View  Result Date: 10/11/2021 CLINICAL DATA:  Congestive heart failure EXAM: CHEST - 2 VIEW COMPARISON:  09/30/2021 FINDINGS: LEFT-sided pacemaker overlies normal cardiac silhouette. Patient rotated leftward. Sternotomy wires noted. LEFT pacer noted. Bilateral pleural effusions present. No pulmonary edema. No infiltrate. IMPRESSION: Cardiomegaly and bilateral pleural effusions Electronically Signed   By: Suzy Bouchard  M.D.   On: 10/11/2021 16:35   ECHOCARDIOGRAM COMPLETE  Result Date: 10/12/2021    ECHOCARDIOGRAM REPORT   Patient Name:   [name] [name] Date of Exam: 10/12/2021 Medical Rec #:  [id number]          Height:       58.0 in Accession #:    [id number]         Weight:       117.5 lb Date of Birth:  November 12, 1945          BSA:          1.453 m Patient Age:    75 years           BP:           144/57 mmHg Patient Gender: F                  HR:           52 bpm. Exam Location:  Inpatient Procedure: 2D Echo Indications:     aortic stenosis. Acute systolic chf.  History:         Patient has prior history of Echocardiogram examinations.                  Aortic Valve Disease and rheumatic heart disease.                   Mitral Valve: mechanical valve valve is present in the mitral                  position.  Sonographer:     Johny Chess RDCS Referring Phys:  [id number] Donato Heinz Diagnosing Phys: Oswaldo Milian MD IMPRESSIONS  1. Left ventricular ejection fraction, by estimation, is 30 to 35%. The left ventricle has moderately decreased function. The left ventricle demonstrates regional wall motion abnormalities. Abnormal (paradoxical) septal motion, consistent with RV pacemaker.     The left ventricular internal cavity size was mildly dilated. There is severe asymmetric left ventricular hypertrophy of the basal-septal segment. Left ventricular diastolic parameters are indeterminate.  2. Right ventricular systolic function is mildly reduced. The right ventricular size is normal. There is mildly elevated pulmonary artery systolic pressure. The estimated right ventricular systolic pressure is Q000111Q mmHg.  3. Left atrial size was severely dilated.  4. Right atrial size was mildly dilated.  5. There is a mechanical valve present in the mitral position     No evidence of mitral valve regurgitation. The mean mitral valve gradient is  3.0 mmHg at 68bpm.  6. Tricuspid valve regurgitation is moderate.  7. The  inferior vena cava is normal in size with <50% respiratory variability, suggesting right atrial pressure of 8 mmHg.  8. The aortic valve is calcified. There is severe calcifcation of the aortic valve. Aortic valve regurgitation is moderate. Severe aortic stenosis. Vmax 4.57m/s, MG 53 mmHg, AVA 0.4 cm^2, DI 0.15 FINDINGS  Left Ventricle: Left ventricular ejection fraction, by estimation, is 30 to 35%. The left ventricle has moderately decreased function. The left ventricle demonstrates regional wall motion abnormalities. The left ventricular internal cavity size was mildly dilated. There is severe asymmetric left ventricular hypertrophy of the basal-septal segment. Abnormal (paradoxical) septal motion, consistent with RV pacemaker. Left ventricular diastolic parameters are indeterminate. Right Ventricle: The right ventricular size is normal. Right vetricular wall thickness was not well visualized. Right ventricular systolic function is mildly reduced. There is mildly elevated pulmonary artery systolic pressure. The tricuspid regurgitant velocity is 2.62 m/s, and with an assumed right atrial pressure of 8 mmHg, the estimated right ventricular systolic pressure is 35.5 mmHg. Left Atrium: Left atrial size was severely dilated. Right Atrium: Right atrial size was mildly dilated. Pericardium: Trivial pericardial effusion is present. Mitral Valve: The mitral valve has been repaired/replaced. No evidence of mitral valve regurgitation. There is a mechanical valve present in the mitral position. MV peak gradient, 10.1 mmHg. The mean mitral valve gradient is 3.0 mmHg. Tricuspid Valve: The tricuspid valve is normal in structure. Tricuspid valve regurgitation is moderate. Aortic Valve: The aortic valve is calcified. There is severe calcifcation of the aortic valve. Aortic valve regurgitation is moderate. Aortic regurgitation PHT measures 392 msec. Severe aortic stenosis is present. Aortic valve mean gradient measures 53.0  mmHg.  Aortic valve peak gradient measures 92.2 mmHg. Aortic valve area, by VTI measures 0.38 cm. Pulmonic Valve: The pulmonic valve was grossly normal. Pulmonic valve regurgitation is trivial. Aorta: The aortic root is normal in size and structure. Venous: The inferior vena cava is normal in size with less than 50% respiratory variability, suggesting right atrial pressure of 8 mmHg. IAS/Shunts: The interatrial septum was not well visualized.  LEFT VENTRICLE PLAX 2D LVIDd:         5.60 cm      Diastology LVIDs:         4.30 cm      LV e' medial:    5.00 cm/s LV PW:         1.10 cm      LV E/e' medial:  25.4 LV IVS:        1.00 cm      LV e' lateral:   9.03 cm/s LVOT diam:     1.90 cm      LV E/e' lateral: 14.1 LV SV:         45 LV SV Index:   31 LVOT Area:     2.84 cm  LV Volumes (MOD) LV vol d, MOD A2C: 139.0 ml LV vol d, MOD A4C: 76.8 ml LV vol s, MOD A2C: 81.7 ml LV vol s, MOD A4C: 53.0 ml LV SV MOD A2C:     57.3 ml LV SV MOD A4C:     76.8 ml LV SV MOD BP:      38.6 ml RIGHT VENTRICLE            IVC RV S prime:     6.74 cm/s  IVC diam: 1.60 cm TAPSE (M-mode): 1.0 cm LEFT ATRIUM  Index         RIGHT ATRIUM           Index LA Vol (A2C):   124.0 ml 85.36 ml/m   RA Area:     16.60 cm LA Vol (A4C):   148.0 ml 101.88 ml/m  RA Volume:   46.80 ml  32.22 ml/m LA Biplane Vol: 139.0 ml 95.68 ml/m  AORTIC VALVE AV Area (Vmax):    0.42 cm AV Area (Vmean):   0.38 cm AV Area (VTI):     0.38 cm AV Vmax:           480.00 cm/s AV Vmean:          341.000 cm/s AV VTI:            1.180 m AV Peak Grad:      92.2 mmHg AV Mean Grad:      53.0 mmHg LVOT Vmax:         70.70 cm/s LVOT Vmean:        45.650 cm/s LVOT VTI:          0.158 m LVOT/AV VTI ratio: 0.13 AI PHT:            392 msec  AORTA Ao Root diam: 3.40 cm Ao Asc diam:  3.70 cm MITRAL VALVE                TRICUSPID VALVE MV Area (PHT): 4.36 cm     TR Peak grad:   27.5 mmHg MV Area VTI:   1.19 cm     TR Vmax:        262.00 cm/s MV Peak grad:  10.1 mmHg MV Mean  grad:  3.0 mmHg     SHUNTS MV Vmax:       1.59 m/s     Systemic VTI:  0.16 m MV Vmean:      80.5 cm/s    Systemic Diam: 1.90 cm MV Decel Time: 174 msec MV E velocity: 127.00 cm/s MV A velocity: 52.10 cm/s MV E/A ratio:  2.44 Oswaldo Milian MD Electronically signed by Oswaldo Milian MD Signature Date/Time: 10/12/2021/1:10:10 PM    Final (Updated)    US Abdomen Limited RUQ (LIVER/GB)  Result Date: 10/11/2021 CLINICAL DATA:  Transaminitis. EXAM: ULTRASOUND ABDOMEN LIMITED RIGHT UPPER QUADRANT COMPARISON:  10/02/2021. FINDINGS: Gallbladder: Surgically absent. Common bile duct: Diameter: 4.6 mm. Liver: No focal lesion identified. The liver has a slightly nodular border inferiorly. Increased parenchymal echogenicity. Portal vein is patent on color Doppler imaging with normal direction of blood flow towards the liver. Other: A trace amount of free fluid is noted in the right upper quadrant. Small right pleural effusion. IMPRESSION: 1. Hepatic steatosis. The liver has a slightly nodular border inferiorly which may be associated with underlying cirrhosis. 2. Trace ascites. 3. Small right pleural effusion. 4. Status post cholecystectomy. Electronically Signed   By: Brett Fairy M.D.   On: 10/11/2021 22:49    Cardiac Studies   Echo 10/12/21:  1. Left ventricular ejection fraction, by estimation, is 30 to 35%. The  left ventricle has moderately decreased function. The left ventricle  demonstrates regional wall motion abnormalities. Abnormal (paradoxical)  septal motion, consistent with RV  pacemaker.      The left ventricular internal cavity size was mildly dilated. There is  severe asymmetric left ventricular hypertrophy of the basal-septal  segment. Left ventricular diastolic parameters are indeterminate.   2. Right ventricular systolic function is mildly reduced. The right  ventricular size  is normal. There is mildly elevated pulmonary artery  systolic pressure. The estimated right  ventricular systolic pressure is  Q000111Q mmHg.   3. Left atrial size was severely dilated.   4. Right atrial size was mildly dilated.   5. There is a mechanical valve present in the mitral position      No evidence of mitral valve regurgitation. The mean mitral valve  gradient is 3.0 mmHg at 68bpm.   6. Tricuspid valve regurgitation is moderate.   7. The inferior vena cava is normal in size with <50% respiratory  variability, suggesting right atrial pressure of 8 mmHg.   8. The aortic valve is calcified. There is severe calcifcation of the  aortic valve. Aortic valve regurgitation is moderate. Severe aortic  stenosis. Vmax 4.64m/s, MG 53 mmHg, AVA 0.4 cm^2, DI 0.15   Patient Profile     75 y.o. female with a history of critical aortic stenosis, rheumatic heart disease, mechanical mitral valve replacement 20 years ago, PPM, atrial fibrillation who presents for direct admission for heparin bridge prior to TAVR planned on 10/14/2021  Assessment & Plan    Acute on chronic combined systolic and diastolic CHF: presented on 12/24 for scheduled admission for heparin bridging for TAVR on 12/27.  Noted to be significantly volume overloaded.  Improving with IV Lasix.  Echo 12/25 showed EF 30 to 35%, mild RV dysfunction, mechanical mitral valve with no MR and mean gradient 3 mmHg, very severe aortic stenosis (Vmax 4.49m/s, MG 53 mmHg, AVA 0.4 cm^2, DI 0.15) -Continue IV Lasix 40 mg twice daily  Severe aortic stenosis: TAVR planned for 1227.  Echo 12/25 showed very severe aortic stenosis (Vmax 4.73m/s, MG 53 mmHg, AVA 0.4 cm^2, DI 0.15) - Will need to optimize volume status prior to TAVR, continue diuresis as above.  Will be cautious to avoid overdiuresis given very severe aortic stenosis  Mechanical mitral valve: Started on heparin drip in preparation for TAVR on 12/27.  Normal valve function on echo 12/25   Transaminitis: Suspect due to hepatic congestion in setting of decompensated heart failure as  above.  Will continue diuresis.  Right upper quadrant ultrasound showed hepatic steatosis with slightly nodular border inferiorly that may be associated with underlying cirrhosis  Paroxysmal atrial fibrillation: On heparin drip as above.  Currently in sinus rhythm.  Was discharged recently on digoxin.  Digoxin level undetectable on admission, holding digoxin.   For questions or updates, please contact [place] Please consult [email or web address] for contact info under        Signed, Donato Heinz, MD  10/13/2021, 10:15 AM

## 2021-10-13 NOTE — Progress Notes (Signed)
°  Transition of Care [facility]) Screening Note   Patient Details  Name: [name] [name] Date of Birth: [birth year]   Transition of Care [facility]) CM/SW Contact:    Bess Kinds, RN Phone Number: [phone] 10/13/2021, 12:26 PM    Transition of Care Department [facility]) has reviewed patient and no TOC needs have been identified at this time. We will continue to monitor patient advancement through interdisciplinary progression rounds. If new patient transition needs arise, please place a TOC consult.

## 2021-10-13 NOTE — Progress Notes (Signed)
ANTICOAGULATION CONSULT NOTE  Pharmacy Consult for heparin Indication: atrial fibrillation/MVR  Allergies  Allergen Reactions   Atenolol Other (See Comments)    HR and pulse dropped   Metoprolol Succinate [Metoprolol] Other (See Comments)    HR and pulse dropped   Baclofen Anxiety and Other (See Comments)    Makes the patient feel anxious the day after taking this   Latex Rash    Patient Measurements: Height: 4\' 10"  (147.3 cm) Weight: 53.8 kg (118 lb 9.7 oz) IBW/kg (Calculated) : 40.9 Heparin Dosing Weight: 52.4 kg  Vital Signs: Temp: 98.2 F (36.8 C) (12/26 0802) Temp Source: Oral (12/26 0802) BP: 124/50 (12/26 0802) Pulse Rate: 56 (12/26 0802)  Labs: Recent Labs    10/11/21 1341 10/11/21 1538 10/11/21 2114 10/12/21 0131 10/12/21 0849 10/12/21 1744 10/13/21 0302  HGB 10.5*  --   --  10.0*  --   --  11.1*  HCT 30.9*  --   --  31.7*  --   --  34.7*  PLT 278  --   --  165  --   --  168  HEPARINUNFRC  --   --    < >  --  0.58 0.53 0.68  CREATININE  --  1.24*  --  1.03*  --   --  0.96   < > = values in this interval not displayed.    Estimated Creatinine Clearance: 37.4 mL/min (by C-G formula based on SCr of 0.96 mg/dL).   Medical History: Past Medical History:  Diagnosis Date   Anxiety    Aortic aneurysm [facility])    Atrial arrhythmia    With prior history of atrial flutter ablation   Bradycardia    s/p pacemaker implantation   CONGESTIVE HEART FAILURE 07/31/2010   Qualifier: Diagnosis of  By: 08/02/2010, MD, Graciela Husbands    GERD (gastroesophageal reflux disease)    Hypertension    Hypothyroidism    Long Q-T syndrome    Myocardial infarct (HCC) 11/20/2011   Stent placement for 100% blockage of LAD   PACEMAKER, PERMANENT 2009   Medtronic Adapta serial # [id number]   Rheumatic heart disease    With mitral valve replacement, tricuspid regurgitation moderate to severe, significant left atrial enlargment   TRICUSPID REGURGITATION 12/23/2010   Qualifier:  Diagnosis of  By: 02/22/2011, MD, Graciela Husbands    Unspecified essential hypertension 07/31/2010   Qualifier: Diagnosis of  By: 08/02/2010, MD, Graciela Husbands    Assessment: 75 yo female admitted for TAVR. She was previously anticoagulated on warfarin. Pharmacy consulted for heparin while warfarin on hold in anticipation of TAVR on 12/27. No infusion issues or bleeding noted. Heparin level is therapeutic this morning at 0.68. H/H and platelets are stable.  Goal of Therapy:  Heparin level 0.3-0.7 units/ml Monitor platelets by anticoagulation protocol: Yes   Plan:  Continue Heparin 900 units/hr  Check heparin level daily while on heparin Continue to monitor H&H and platelets   Thank you for allowing 1/28 to participate in this patients care. [place], PharmD 10/13/2021 10:22 AM  **Pharmacist phone directory can be found on [email or web address] listed under [facility]**

## 2021-10-13 NOTE — H&P (View-Only) (Signed)
[address]       [place] [address]             [phone]      Cardiothoracic Surgery Admission History and Physical   Patient ID: [name] [name]  MRN: [id number]; DOB: 10-07-1946   Primary Care Provider: Ronita Hipps, MD  [facility] Cardiologist: Sherren Mocha, MD  [facility] Electrophysiologist: None  Reason for admission: severe aortic stenosis  History of Present Illness:  Ms. [name] [name] lives alone in [place]. She has two daughters, one who lives locally. She takes care of all of her own ADLs, but this has become limited due to her health decline. Of note, she wears full dentures.  She had rheumatic fever at age 62 and went on to develop rheumatic heart disease. She ultimately underwent mechanical mitral valve replacement approximately 20 years ago at [facility]. She has tolerated long-term warfarin therapy well without any recent bleeding problems. She underwent permanent pacemaker placement (dual-chamber) in 2009 by Dr Caryl Comes. The patient's recent echocardiogram from July 22, 2021 demonstrates severe global LV systolic dysfunction with LVEF 30 to 35%, severe calcification and restriction of the aortic valve leaflets, mild to moderate aortic valve insufficiency, and critical aortic stenosis with a mean transvalvular gradient of 53 mmHg, peak systolic velocity of 4.5 m/s, and calculated aortic valve area less than 0.5 cm. She actually underwent TAVR evaluation at [facility], but did not follow through with completing her work-up there. She had CT angiography studies done earlier this year. She appeared to have access for transfemoral TAVR. She was then evaluated at [facility] but did not want to be treated at an academic center and requested referral to [facility]. She was seen by Dr. Burt Knack in the office on 08/22/21 and complained of shortness of breath and fatigue with low-level activity. She described progressive shortness of  breath and generalized fatigue over the past year, now with limitation during low-level activities. She can only walk short distances without feeling breathless.  She was then admitted 11/8-11/12 for influenza A and CHF. She was treated with IV lasix and Tamiflu. She had agitation and was noncompliant during this admission refusing care and being aggressive with staff. Pt refused further lasix stating she was dehydrated and home HCTZ was discontinued at discharge.  We attempted to bring her back into the office for re-evaluation with structural heart, but patient no showed and then ultimately refused an office visit saying she wanted to save money and wanted to just proceed with CT scans and appt with me. These were set up for 10/01/21 but, unfortunately, the patient was too volume overloaded to lay for scanning and she was directly admitted to the hospital for IV diuresis. Once able to lye flat CT scans were completed 10/02/21. Cardiac gated CTA of the heart revealed anatomical characteristics consistent with aortic stenosis suitable for treatment by transcatheter aortic valve replacement without any significant complicating features and CTA of the aorta and iliac vessels demonstrated what appear to be adequate pelvic vascular access to facilitate a transfemoral approach. Her coumadin was transitioned to heparin with plan to proceed with L/RHC on 10/03/21. INR 2.0 today.  Today she is seen laying in bed. She has no complaints. Breathing better. Mild LE edema. Laying on her side bc of back pain. She says she feels a little woozy from a pain pill.      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   Qualifier: Diagnosis of By: Caryl Comes, MD, Remus Blake    Rheumatic heart disease    With mitral valve replacement, tricuspid regurgitation moderate to severe, significant left atrial enlargment   TRICUSPID REGURGITATION 12/23/2010   Qualifier: Diagnosis of By: Caryl Comes, MD, Remus Blake    Unspecified essential hypertension 07/31/2010   Qualifier: Diagnosis of By: Caryl Comes, MD, [facility], Mack Guise         Past Surgical History:  Procedure Laterality Date   APPENDECTOMY     CARDIAC ELECTROPHYSIOLOGY STUDY AND ABLATION     Stent and pacemaker placement   CHOLECYSTECTOMY     CORONARY ANGIOPLASTY WITH STENT PLACEMENT Left 09/13/2015   Dr. Gwenith Spitz at [facility], LAD stent   MITRAL VALVE REPLACEMENT  2001   mechanical valve   OOPHORECTOMY     PACEMAKER INSERTION  11/20/2007   Dual chamber pacemaker implantation with rapid ventricular pacing   SALPINGECTOMY Bilateral    TONSILLECTOMY     Home Medications:         Prior to Admission medications   Medication Sig Start Date End Date Taking? Authorizing Provider  albuterol (VENTOLIN HFA) 108 (90 Base) MCG/ACT inhaler Inhale 2 puffs into the lungs every 6 (six) hours as needed for shortness of breath. 03/21/14  Yes [provider]  ALPRAZolam Duanne Moron) 1 MG tablet Take 0.5-1 mg by mouth 2 (two) times daily as needed for anxiety.   Yes [provider]  atorvastatin (LIPITOR) 80 MG tablet Take 80 mg by mouth at bedtime. 11/27/11  Yes [provider]  baclofen (LIORESAL) 20 MG tablet Take 10 mg by mouth every 8 (eight) hours as needed for muscle spasms. 01/03/21  Yes [provider]  candesartan (ATACAND) 8 MG tablet Take 4 mg by mouth daily as needed ("as long as the Diastolic number is not too low"). 06/02/21  Yes [provider]  DILT-XR 240  MG 24 hr capsule Take 1 capsule (240 mg total) by mouth daily. 08/30/21  Yes Mercy Riding, MD  furosemide (LASIX) 20 MG tablet Take 40 mg by mouth 2 (two) times daily. 09/21/21  Yes [provider]  KLOR-CON M20 20 MEQ tablet Take 40 mEq by mouth See admin instructions. Crush and mix 40 mEq with applesauce and eat once a day   Yes [provider]  lactose free nutrition (BOOST PLUS) LIQD Take 237 mLs by mouth 2 (two) times daily.   Yes [provider]  levothyroxine (SYNTHROID) 75 MCG tablet Take 75 mcg by mouth daily before breakfast. 03/12/21 10/31/21 Yes [provider]  nitroGLYCERIN (NITROSTAT) 0.4 MG SL tablet Place 0.4 mg under the tongue every 5 (five) minutes as needed for chest pain. 01/14/21  Yes [provider]  warfarin (COUMADIN) 5 MG tablet Take 2 tablets (10 mg total) by mouth daily for 1 day, THEN 1.5 tablets (7.5 mg total) daily.  Patient taking differently: Take 7.5 mg by mouth at bedtime unless  INR is 2.5-3.5, then adjust as directed 08/30/21 11/29/21 Yes Gonfa, Taye T, MD  zolpidem (AMBIEN) 10 MG tablet Take 5-10 mg by mouth at bedtime. 11/27/11  Yes [provider]  Inpatient Medications:  Scheduled Meds:   atorvastatin 80 mg Oral QHS   docusate sodium 100 mg Oral BID   furosemide 40 mg Intravenous BID   levothyroxine 75 mcg Oral QAC breakfast   mouth rinse 15 mL Mouth Rinse BID   potassium chloride 40 mEq Oral BID   sodium chloride flush 3 mL Intravenous Q12H   zolpidem 5 mg Oral QHS   Continuous Infusions:   heparin 750 Units/hr (10/02/21 0927)   PRN Meds:  acetaminophen **OR** acetaminophen, ALPRAZolam, baclofen, bisacodyl, hydrALAZINE, morphine injection, oxyCODONE, polyethylene glycol, traZODone  Allergies:       Allergies  Allergen Reactions   Atenolol Other (See Comments)    HR and pulse dropped   Metoprolol Succinate [Metoprolol] Other (See Comments)    HR and pulse dropped   Baclofen Anxiety and Other (See  Comments)    Makes the patient feel anxious the day after taking this   Latex Rash   Social History:  Social History        Socioeconomic History   Marital status: Single    Spouse name: Not on file   Number of children: Not on file   Years of education: Not on file   Highest education level: Not on file  Occupational History   Not on file  Tobacco Use   Smoking status: Never   Smokeless tobacco: Never  Substance and Sexual Activity   Alcohol use: Never   Drug use: Never   Sexual activity: Not on file  Other Topics Concern   Not on file  Social History Narrative   Not on file   Social Determinants of Health   Financial Resource Strain: Not on file  Food Insecurity: Not on file  Transportation Needs: Not on file  Physical Activity: Not on file  Stress: Not on file  Social Connections: Not on file  Intimate Partner Violence: Not on file   Family History:       Family History  Problem Relation Age of Onset   Leukemia Mother    Stroke Mother    Heart disease Father    Stroke Maternal Grandmother    Heart attack Paternal Grandmother    ROS:  Please see the history of present illness.  All other ROS reviewed and negative.  Physical Exam/Data:         Vitals:   10/01/21 2031 10/02/21 0001 10/02/21 0557 10/02/21 0807  BP: (!) 115/53 (!) 103/53 (!) 117/50 (!) 123/53  Pulse: 61 (!) 59 63 60  Resp: 15 (!) 24 15 18   Temp: 98.6 F (37 C) 98.3 F (36.8 C) 97.6 F (36.4 C) 98.6 F (37 C)  TempSrc: Oral Oral Oral Oral  SpO2: 97% 92% 93% 92%  Weight:  54.4 kg      Intake/Output Summary (Last 24 hours) at 10/02/2021 0952  Last data filed at 10/02/2021 0536     Gross per 24 hour  Intake 945 ml  Output 1450 ml  Net -505 ml   Last 3 Weights 10/02/2021 08/30/2021 08/28/2021  Weight (lbs) 120 lb 114 lb 10.2 oz 117 lb 15.1 oz  Weight (kg) 54.432 kg 52 kg 53.5 kg   Body mass index is 25.08 kg/m.  General: Well nourished, well developed, in no acute distress   HEENT: normal  Lymph: no adenopathy  Neck: no JVD  Endocrine: No thyromegaly  Cardiac: normal S1, S2; RRR; harsh SEM, mechanical click at apex  Lungs: clear to auscultation bilaterally, no wheezing, rhonchi or rales  Abd: soft, nontender, no hepatomegaly  Ext: mi  Musculoskeletal: No deformities, BUE and BLE strength normal and equal  Skin: warm and dry  Neuro: CNs 2-12 intact, no focal abnormalities noted  Psych: Normal affect  EKG: The EKG was personally reviewed and demonstrates: AV paced with wide LBBB.  Telemetry: Telemetry was personally reviewed and demonstrates: paced with PVCs  Relevant CV Studies:  Echo outside films: 07/22/21  Echo:  SUMMARY  The left ventricular size is normal.  There is moderate concentric left ventricular hypertrophy.  Left ventricular systolic function is moderately reduced.  LV ejection fraction = 35-40%.  Abnormal (paradoxical) septal motion consistent with RV pacemaker.  There are regional wall motion abnormalities as specified below.  There is severe hypokinesis and thinning of the anterior,  anteroseptal, inferoseptal territory from mid to apex.  The right ventricle is normal in size and function.  The left atrium is severely dilated.  Diffuse thickening of the aortic valve with restricted cusp opening.  There is critically severe aortic stenosis.  There is moderate aortic regurgitation.  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  There is moderate tricuspid regurgitation. Moderate pulmonary  hypertension.  The aortic Sinus(es) of Valsalva are borderline dilated.  The IVC is normal in size with an inspiratory collapse of greater then  50%, suggesting normal right atrial pressure.  There is no pericardial effusion.  Compared to prior study dated 12/30/20, AS is now critically severe.   -  FINDINGS:  LEFT VENTRICLE  The left ventricular size is normal. Upper septal hypertrophy (sigmoid  septum), normal variant. There is moderate concentric left  ventricular  hypertrophy. Left ventricular systolic function is moderately reduced.  LV ejection fraction = 35-40%. LV Global L Strain =-8.3%. There is  severe [Grade III] diastolic dysfunction [restrictive filling  pattern], with elevated left atrial pressure. Mitral inflow  deceleration timeAbnormal<150 msec. Consistent with restrictive  physiology.. Abnormal (paradoxical) septal motion consistent with RV  pacemaker. There are regional wall motion abnormalities as specified  below. There is severe hypokinesis and thinning of the anterior,  anteroseptal, inferoseptal territory from mid to apex.   -  RIGHT VENTRICLE  The right ventricle is normal in size and function.   LEFT ATRIUM  The left atrium is severely dilated. LAVI 111.0 ml/m2.   RIGHT ATRIUM  Right atrial size is normal.  -  AORTIC VALVE  The aortic valve is trileaflet. Diffuse thickening of the aortic valve  with restricted cusp opening. There is critically severe aortic  stenosis. There is moderate aortic regurgitation. The calculated  aortic valve area using the continuity equation is 0.4 cm2. Aortic  valve mean pressure gradient is 53 mmHg. The peak aortic valve  velocity 449 cm/s.  -  MITRAL VALVE  There is no mitral regurgitation noted. The mean gradient across the  mitral valve is 3.5 mmHg. The heart rate for the mean mitral valve  gradient is 61 BPM. 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  -  TRICUSPID VALVE  Structurally normal tricuspid valve. There is moderate tricuspid  regurgitation. Estimated right ventricular systolic pressure is 50  mmHg. Moderate pulmonary hypertension.  -  PULMONIC VALVE  Structurally normal pulmonic valve. Trace pulmonic valvular  regurgitation.  -  ARTERIES  The aortic Sinus(es) of Valsalva are borderline dilated. The ascending  aorta is normal size. Mild pulmonary artery dilation. Measures 3.2 cm.   -  VENOUS  Pulmonary venous flow pattern not well visualized. The IVC is  normal  in size with an inspiratory collapse of greater then 50%, suggesting  normal right atrial pressure.  -  EFFUSION  There is no pericardial effusion.  -  -   MMode/2D Measurements & Calculations  IVSd: 1.7 cm LA dim: 6.0 cm ESV(MOD-sp4): Ao sinus diam:  LVIDd: 5.2 cm EDV(MOD-sp4): 75.2 ml 3.8 cm  LVPWd: 1.3 cm 121.0 ml EDV(MOD-sp2):  LVIDs: 3.7 cm 169.0 ml  ESV(MOD-sp2):  113.0 ml   _______________________________________________________________________  asc Aorta Diam: LVOT diam: 2.0 cm SV(MOD-sp4): EF A4C: 37.9 %  3.6 cm 45.8 ml  SI(MOD-sp4):  29.8 ml/m2   _______________________________________________________________________  LA area A2: LA area A4: LA ESV (BP): LA ESV Index  40.9 cm2 41.2 cm2 171.0 ml (A2C): 109.1 ml/m2    _______________________________________________________________________  LA ESV Index LA ESV Index (BP):LA vol: LA vol index:  (A4C): 111.0 ml/m2 111.0 ml/m2 183.4 ml 119.4 ml/m2    _______________________________________________________________________  SV A4C: 45.8 ml   Doppler Measurements & Calculations  MV E max vel: MV V2 max: MV P1/2t max IN:[id number]):  138.0 cm/sec 166.8 cm/sec 162.4 cm/sec 45.3 ml  MV A max vel: MV max PG: MV P1/2t: Ao V2 max:  59.2 cm/sec 11.1 mmHg 58.5 msec 491.2 cm/sec  MV E/A: 2.3 MV V2 mean: MV dec time: Ao max PG:  Med Peak E' Vel: 77.0 cm/sec 0.12 sec 96.5 mmHg  4.8 cm/sec MV mean PG: Ao V2 mean:  Lat Peak E' Vel: 3.0 mmHg 352.9 cm/sec  12.5 cm/sec MV V2 VTI: Ao mean PG:  E/Lat E`: 11.0 37.9 cm 57.4 mmHg  E/Med E`: 28.8 MVA(P1/2t): Ao V2 VTI:  3.8 cm2 144.2 cm  MVA(VTI): 1.2 cm2 AVA (VTI):  0.31 cm2   _______________________________________________________________________  AI max vel: LV V1 VTI: TR max vel: RAP systole:  401.4 cm/sec 14.7 cm 342.1 cm/sec 3.0 mmHg  AI dec slope: TR max PG:  335.9 cm/sec2 46.8 mmHg  AI P1/2t: RVSP(TR):  350.0 msec 49.8 mmHg     _______________________________________________________________________  AS Dimensionless AVAi(VTI) SV index(LVOT):  Index (VTI): 0.10 cm^2/m^2:  29.5 ml/m2  0.20 cm2  _____________________________  Cardiac CT 10/02/2021  CLINICAL DATA: Aortic stenosis  EXAM:  Cardiac TAVR CT  TECHNIQUE:  The patient was scanned on a Siemens Force [id number] slice scanner. A 120  kV retrospective scan was triggered in the descending thoracic aorta  at 111 HU's. Gantry rotation speed was 270 msecs and collimation was  .9 mm. No beta blockade or nitro were given. The 3D data set was  reconstructed in 5% intervals of the R-R cycle. Systolic and  diastolic phases were analyzed on a dedicated work station using  MPR, MIP and VRT modes. The patient received 80 cc of contrast.  FINDINGS:  Aortic Valve: Tri leaflet calcified with restricted leaflet motion  Calcium score > 5000  Aorta: Heavily calcified mildly dilated ascending thoracic aorta  severe calcific atherosclerosis  Sinotubular Junction: 27 mm  Ascending Thoracic Aorta: 39 mm  Descending Thoracic Aorta: 24 mm  Sinus of Valsalva Measurements:  Non-coronary: 34.8 mm  Right - coronary: 33.6 mm  Left - coronary: 32.4 mm  Coronary Artery Height above Annulus:  Left Main: 11.7 mm above annulus  Right Coronary: 11.1 mm above annulus  Virtual Basal Annulus Measurements:  Maximum/Minimum Diameter: 27 mm x 19.4 mm  Perimeter: 77.6  mm  Area: 441 mm2 30% phase  Coronary Arteries: Sufficient height above annulus for deployment  Optimum Fluoroscopic Angle for Delivery: LAO 12 Caudal 12 degrees  IMPRESSION:  1. Calcified tri-leaflet AV with score >5,000  2. Annular area of 441 mm2 suitable for a 26 mm Sapien 3 valve  3. Normal appearing bileaflet mechanical MVR with no pannus and good  opening /closing angles  4. Mixing artifact in LAA no delayed images done  5. Coronary arteries sufficient height above annulus for deployment  6. Optimum angiographic  angle for deployment LAO 12 Caudal 12  degrees  7. Patient appearing stent in LAD  8. Pacing wires in RA/RV  9. Dilated main PA 4.1 cm suggesting elevated PA systolic pressures  Jenkins Rouge  Electronically Signed  By: Jenkins Rouge M.D.  On: 10/02/2021 09:41  _____________________  CT abdomen/chest/pelvis 10/02/21  Narrative & Impression  CLINICAL DATA: 75 year old female with history of severe aortic  stenosis. Preprocedural study prior to potential transcatheter  aortic valve replacement (TAVR) procedure.  EXAM:  CT ANGIOGRAPHY CHEST, ABDOMEN AND PELVIS  TECHNIQUE:  Multidetector CT imaging through the chest, abdomen and pelvis was  performed using the standard protocol during bolus administration of  intravenous contrast. Multiplanar reconstructed images and MIPs were  obtained and reviewed to evaluate the vascular anatomy.  CONTRAST: 135mL OMNIPAQUE IOHEXOL 350 MG/ML SOLN  COMPARISON: None.  FINDINGS:  CTA CHEST FINDINGS  Cardiovascular: Heart size is severely enlarged. There is no  significant pericardial fluid, thickening or pericardial  calcification. There is aortic atherosclerosis, as well as  atherosclerosis of the great vessels of the mediastinum and the  coronary arteries, including calcified atherosclerotic plaque in the  left main, left anterior descending, left circumflex and right  coronary arteries. Myocardial thinning and subendocardial  hypoattenuation throughout the mid to distal LAD territory, likely  sequela of prior LAD territory myocardial infarction(s). This is  associated with some mild aneurysmal dilatation of the left  ventricular apex. Severe dilatation of the pulmonic trunk (4.3 cm in  diameter), concerning for pulmonary arterial hypertension. Severe  thickening calcification of the aortic valve. Status post mitral  valve replacement with mechanical mitral valve. Calcifications of  the mitral annulus. Left-sided pacemaker device in place with lead   tips terminating in the right atrial appendage and right ventricular  apex.  Mediastinum/Lymph Nodes: No pathologically enlarged mediastinal or  hilar lymph nodes. Hilar esophagus is unremarkable in appearance. No  axillary lymphadenopathy.  Lungs/Pleura: Small bilateral pleural effusions (right greater than  left) lying dependently. Scattered areas of subsegmental atelectasis  are noted in the lower lungs, along with near complete atelectasis  of the right middle lobe. There is a background of mild diffuse  ground-glass attenuation and interlobular septal thickening  throughout the lungs bilaterally, suggesting the presence of mild  interstitial pulmonary edema.  Musculoskeletal/Soft Tissues: Median sternotomy wires. There are no  aggressive appearing lytic or blastic lesions noted in the  visualized portions of the skeleton.  CTA ABDOMEN AND PELVIS FINDINGS  Hepatobiliary: Heterogeneous attenuation throughout the hepatic  parenchyma, with large areas of low attenuation most evident in the  right lobe of the liver, favored to reflect a background of  heterogeneous hepatic steatosis. No definite focal cystic or solid  hepatic lesions. No intra or extrahepatic biliary ductal dilatation.  Status post cholecystectomy.  Pancreas: No pancreatic mass. No pancreatic ductal dilatation. No  pancreatic or peripancreatic fluid collections or inflammatory  changes.  Spleen: Small splenule inferior to the spleen.  Otherwise,  unremarkable.  Adrenals/Urinary Tract: Mild multifocal cortical thinning in both  kidneys. Subcentimeter low-attenuation lesion in the upper pole of  the left kidney, too small to characterize, but statistically likely  to represent a tiny cyst. Bilateral adrenal glands are normal in  appearance. No hydroureteronephrosis. Urinary bladder is normal in  appearance.  Stomach/Bowel: The appearance of the stomach is normal. There is no  pathologic dilatation of small bowel or  colon. The appendix is not  confidently identified and may be surgically absent. Regardless,  there are no inflammatory changes noted adjacent to the cecum to  suggest the presence of an acute appendicitis at this time.  Vascular/Lymphatic: Aortic atherosclerosis, with vascular findings  and measurements pertinent to potential TAVR procedure, as detailed  below. No aneurysm or dissection noted in the abdominal or pelvic  vasculature. No lymphadenopathy identified in the abdomen or pelvis.  Reproductive: Uterus and ovaries are atrophic.  Other: No significant volume of ascites. No pneumoperitoneum.  Musculoskeletal: There are no aggressive appearing lytic or blastic  lesions noted in the visualized portions of the skeleton.  VASCULAR MEASUREMENTS PERTINENT TO TAVR:  AORTA:  Minimal Aortic Diameter-13 x 14 mm  Severity of Aortic Calcification-moderate to severe  RIGHT PELVIS:  Right Common Iliac Artery -  Minimal Diameter-7.7 x 7.1 mm  Tortuosity-mild  Calcification-moderate  Right External Iliac Artery -  Minimal Diameter-7.1 x 7.1 mm  Tortuosity-mild-to-moderate  Calcification-none  Right Common Femoral Artery -  Minimal Diameter-6.5 x 6.3 mm  Tortuosity-mild  Calcification-none  LEFT PELVIS:  Left Common Iliac Artery -  Minimal Diameter-8.2 x 8.7 mm  Tortuosity-mild  Calcification-moderate  Left External Iliac Artery -  Minimal Diameter-6.3 x 7.2 mm  Tortuosity-mild  Calcification-none  Left Common Femoral Artery -  Minimal Diameter-6.8 x 6.8 mm  Tortuosity-mild  Calcification-none  Review of the MIP images confirms the above findings.  IMPRESSION:  1. Vascular findings and measurements pertinent to potential TAVR  procedure, as detailed above.  2. Severe thickening calcification of the aortic valve, compatible  with reported clinical history of severe aortic stenosis.  3. Cardiomegaly with findings suggestive of congestive heart  failure, including evidence of mild  interstitial pulmonary edema and  small bilateral pleural effusions, as detailed above.  4. Aortic atherosclerosis, in addition to left main and 3 vessel  coronary artery disease. There is also evidence of prior LAD  territory myocardial infarction(s), as above.  5. Severe dilatation of the pulmonic trunk (4.3 cm in diameter),  concerning for pulmonary arterial hypertension.  6. Heterogeneous hepatic steatosis.  7. Additional incidental findings, as above.  Laboratory Data:  High Sensitivity Troponin:  Last Labs       Recent Labs  Lab 09/30/21  1103 09/30/21  1302  TROPONINIHS 28* 23*  Chemistry  Last Labs        Recent Labs  Lab 09/30/21  1103 10/01/21  0400 10/02/21  0241  NA 133* 132* 135  K 4.6 2.8* 3.5  CL 99 97* 100  CO2 24 26 24   GLUCOSE 117* 90 111*  BUN 18 15 14   CREATININE 1.27* 1.14* 1.14*  CALCIUM 9.2 8.5* 8.5*  GFRNONAA 44* 51* 51*  ANIONGAP 10 9 11    Last Labs      Recent Labs  Lab 09/30/21  1302  PROT 7.0  ALBUMIN 4.1  AST 26  ALT 17  ALKPHOS 84  BILITOT 1.3*  Hematology  Last Labs       Recent Labs  Lab 09/30/21  1103 10/01/21  0400  WBC 6.3 4.7  RBC 4.28 3.76*  HGB 12.1 10.7*  HCT 38.3 33.5*  MCV 89.5 89.1  MCH 28.3 28.5  MCHC 31.6 31.9  RDW 16.3* 16.2*  PLT 189 147*  BNP  Last Labs      Recent Labs  Lab 09/30/21  1103  BNP 2,815.3*  DDimer  Last Labs   No results for input(s): DDIMER in the last 168 hours.  Radiology/Studies:  DG Chest 2 View  Result Date: 09/30/2021  CLINICAL DATA: Shortness of breath EXAM: CHEST - 2 VIEW COMPARISON: 08/26/2021 FINDINGS: Cardiomegaly status post median sternotomy with mitral annulus prosthesis. Left chest multi lead pacer. Small, layering bilateral pleural effusions and associated atelectasis or consolidation. Exaggerated thoracic kyphosis. Disc degenerative disease of the thoracic spine. IMPRESSION: 1. Cardiomegaly. 2. Small, layering bilateral pleural effusions and associated  atelectasis or consolidation. Electronically Signed By: Delanna Ahmadi M.D. On: 09/30/2021 11:27  DG Abdomen 1 View  Result Date: 09/30/2021  CLINICAL DATA: Shortness of breath, LEFT lower quadrant abdominal pain, unable to urinate since yesterday, constipation EXAM: ABDOMEN - 1 VIEW COMPARISON: 02/27/2014 FINDINGS: Nonobstructive bowel gas pattern. No bowel dilatation or bowel wall thickening. Bones demineralized with degenerative facet disease changes lower lumbar spine. Surgical clips RIGHT upper quadrant. No urinary tract calcifications. IMPRESSION: No acute abnormalities. Electronically Signed By: Lavonia Dana M.D. On: 09/30/2021 11:29  CT CORONARY MORPH W/CTA COR W/SCORE W/CA W/CM &/OR WO/CM  Addendum Date: 10/02/2021  ADDENDUM REPORT: 10/02/2021 09:41 CLINICAL DATA: Aortic stenosis EXAM: Cardiac TAVR CT TECHNIQUE: The patient was scanned on a Siemens Force [id number] slice scanner. A 120 kV retrospective scan was triggered in the descending thoracic aorta at 111 HU's. Gantry rotation speed was 270 msecs and collimation was .9 mm. No beta blockade or nitro were given. The 3D data set was reconstructed in 5% intervals of the R-R cycle. Systolic and diastolic phases were analyzed on a dedicated work station using MPR, MIP and VRT modes. The patient received 80 cc of contrast. FINDINGS: Aortic Valve: Tri leaflet calcified with restricted leaflet motion Calcium score > 5000 Aorta: Heavily calcified mildly dilated ascending thoracic aorta severe calcific atherosclerosis Sinotubular Junction: 27 mm Ascending Thoracic Aorta: 39 mm Descending Thoracic Aorta: 24 mm Sinus of Valsalva Measurements: Non-coronary: 34.8 mm Right - coronary: 33.6 mm Left - coronary: 32.4 mm Coronary Artery Height above Annulus: Left Main: 11.7 mm above annulus Right Coronary: 11.1 mm above annulus Virtual Basal Annulus Measurements: Maximum/Minimum Diameter: 27 mm x 19.4 mm Perimeter: 77.6 mm Area: 441 mm2 30% phase Coronary Arteries: Sufficient  height above annulus for deployment Optimum Fluoroscopic Angle for Delivery: LAO 12 Caudal 12 degrees IMPRESSION: 1. Calcified tri-leaflet AV with score >5,000 2. Annular area of 441 mm2 suitable for a 26 mm Sapien 3 valve 3. Normal appearing bileaflet mechanical MVR with no pannus and good opening /closing angles 4. Mixing artifact in LAA no delayed images done 5. Coronary arteries sufficient height above annulus for deployment 6. Optimum angiographic angle for deployment LAO 12 Caudal 12 degrees 7. Patient appearing stent in LAD 8. Pacing wires in RA/RV 9. Dilated main PA 4.1 cm suggesting elevated PA systolic pressures Jenkins Rouge Electronically Signed By: Jenkins Rouge M.D. On: 10/02/2021 09:41  Result Date: 10/02/2021  EXAM: OVER-READ INTERPRETATION CT CHEST The following report is an over-read performed by radiologist Dr. Vinnie Langton of [facility], [place] on 10/02/2021. This over-read does not include interpretation of cardiac or coronary anatomy or pathology. The coronary  calcium score/coronary CTA interpretation by the cardiologist is attached. COMPARISON: Chest CT 07/22/2021. FINDINGS: Extracardiac findings will be described separately under dictation for contemporaneously obtained CTA chest, abdomen and pelvis. IMPRESSION: Please see separate dictation for contemporaneously obtained CTA chest, abdomen and pelvis dated 10/02/2021 for full description of relevant extracardiac findings. Electronically Signed: By: Vinnie Langton M.D. On: 10/02/2021 09:14    STS score  Procedure: Isolated AVR  Risk of Mortality:  4.169%  Renal Failure:  2.054%  Permanent Stroke:  0.725%  Prolonged Ventilation:  13.176%  DSW Infection:  0.143%  Reoperation:  4.706%  Morbidity or Mortality:  17.897%  Short Length of Stay:  14.359%  Long Length of Stay:  9.243%  ________________________  [place]  KCCQ-12 08/22/2021  1 a. Ability to shower/bathe Not at all  limited  1 b. Ability to walk 1 block Moderately limited  1 c. Ability to hurry/jog Extremely limited  2. Edema feet/ankles/legs 3+ times a week, not every day  3. Limited by fatigue 1-2 times a week  4. Limited by dyspnea Several times a day  5. Sitting up / on 3+ pillows Never over the past 2 weeks  6. Limited enjoyment of life Moderately limited  7. Rest of life w/ symptoms Somewhat satisfied  8 a. Participation in hobbies Limited quite a bit  8 b. Participation in chores Moderately limited  8 c. Visiting family/friends Moderately limited   Assessment and Plan:    The patient is a 75 year old woman with a history of rheumatic heart disease status post mechanical mitral valve replacement about 20 years ago at [facility] by Dr. Anne Shutter. She has been treated with long-term Coumadin for her mechanical valve and PAF. She had a permanent pacemaker placed in 2009. She also has a history of coronary disease with MI and prior stenting. She now presents with progressive exertional shortness of breath and fatigue as well as lower extremity edema. Her most recent echo in October 2022 showed reduced ejection fraction of 30 to 35%. The aortic valve was severely calcified with restricted leaflet mobility with a mean gradient of 53 mmHg and a valve area of 0.5 cm consistent with critical aortic stenosis. There was mild to moderate aortic insufficiency. She started her TAVR work-up and was supposed to see me yesterday in the office after her scans but was unable to lay flat on the CT scan table and was felt to be volume overloaded and admitted for intravenous diuresis. She feels much better. Her gated cardiac CTA this morning shows anatomy suitable for a 26 mm SAPIEN 3 valve. There was a patent stent in the LAD. There is diffuse calcification of the ascending aorta particularly medially that extends from the sinotubular junction up to the aortic arch. Her abdominal and pelvic CTA shows adequate pelvic vascular  anatomy to allow transfemoral insertion. She is scheduled for cardiac catheterization tomorrow. I agree that aortic valve replacement is indicated in this patient for relief of her symptoms and to prevent further left ventricular deterioration. I do not think she is a candidate for open surgical AVR given the diffuse calcification of her ascending aorta as well as her age with prior mitral valve replacement. I think TAVR would be a reasonable alternative for treating her.  The patient was counseled at length regarding treatment alternatives for management of severe symptomatic aortic stenosis. The risks and benefits of surgical intervention has been discussed in detail. Long-term prognosis with medical therapy was discussed. Alternative approaches such as conventional  surgical aortic valve replacement, transcatheter aortic valve replacement, and palliative medical therapy were compared and contrasted at length. This discussion was placed in the context of the patient's own specific clinical presentation and past medical history. All of her questions have been addressed.  Following the decision to proceed with transcatheter aortic valve replacement, a discussion was held regarding what types of management strategies would be attempted intraoperatively in the event of life-threatening complications, including whether or not the patient would be considered a candidate for the use of cardiopulmonary bypass and/or conversion to open sternotomy for attempted surgical intervention. She is not a candidate for emergent sternotomy to manage any intraoperative complications. The patient is aware of the fact that transient use of cardiopulmonary bypass may be necessary. The patient has been advised of a variety of complications that might develop including but not limited to risks of death, stroke, paravalvular leak, aortic dissection or other major vascular complications, aortic annulus rupture, device embolization, cardiac  rupture or perforation, mitral regurgitation, acute myocardial infarction, arrhythmia, heart block or bradycardia requiring permanent pacemaker placement, congestive heart failure, respiratory failure, renal failure, pneumonia, infection, other late complications related to structural valve deterioration or migration, or other complications that might ultimately cause a temporary or permanent loss of functional independence or other long term morbidity. The patient provides full informed consent for the procedure as described and all questions were answered.   Alleen Borne, MD

## 2021-10-13 NOTE — Progress Notes (Signed)
Obtained the consent form and placed in pt's chart.   Darilyn Storbeck S Azizi Bally, RN  

## 2021-10-14 ENCOUNTER — Inpatient Hospital Stay (HOSPITAL_COMMUNITY): Payer: Medicare Other

## 2021-10-14 ENCOUNTER — Encounter (HOSPITAL_COMMUNITY): Payer: Self-pay | Admitting: Cardiology

## 2021-10-14 ENCOUNTER — Inpatient Hospital Stay (HOSPITAL_COMMUNITY): Payer: Medicare Other | Admitting: Certified Registered"

## 2021-10-14 ENCOUNTER — Encounter (HOSPITAL_COMMUNITY): Admission: RE | Disposition: A | Discharge status: Home / Self Care | Payer: Self-pay | Attending: Cardiology

## 2021-10-14 ENCOUNTER — Inpatient Hospital Stay (HOSPITAL_COMMUNITY): Admission: RE | Admit: 2021-10-14 | Payer: Medicare Other | Source: Home / Self Care | Admitting: Cardiovascular Disease

## 2021-10-14 DIAGNOSIS — Z952 Presence of prosthetic heart valve: Secondary | ICD-10-CM

## 2021-10-14 DIAGNOSIS — Z006 Encounter for examination for normal comparison and control in clinical research program: Secondary | ICD-10-CM

## 2021-10-14 DIAGNOSIS — I350 Nonrheumatic aortic (valve) stenosis: Secondary | ICD-10-CM

## 2021-10-14 HISTORY — DX: Presence of prosthetic heart valve: Z95.2

## 2021-10-14 HISTORY — PX: TRANSCATHETER AORTIC VALVE REPLACEMENT, TRANSFEMORAL: SHX6400

## 2021-10-14 HISTORY — PX: INTRAOPERATIVE TRANSTHORACIC ECHOCARDIOGRAM: SHX6523

## 2021-10-14 LAB — ECHOCARDIOGRAM LIMITED
AR max vel: 2.12 cm2
AV Area VTI: 1.51 cm2
AV Area mean vel: 1.77 cm2
AV Mean grad: 21.7 mmHg
AV Peak grad: 27.5 mmHg
Ao pk vel: 2.62 m/s
Height: 58 in
MV VTI: 2.84 cm2
P 1/2 time: 334 msec
Weight: 1897.72 oz

## 2021-10-14 LAB — HEPARIN LEVEL (UNFRACTIONATED): Heparin Unfractionated: 0.6 IU/mL (ref 0.30–0.70)

## 2021-10-14 LAB — POCT I-STAT, CHEM 8
BUN: 18 mg/dL (ref 8–23)
BUN: 17 mg/dL (ref 8–23)
Calcium, Ion: 1.1 mmol/L — ABNORMAL LOW (ref 1.15–1.40)
Calcium, Ion: 1.14 mmol/L — ABNORMAL LOW (ref 1.15–1.40)
Chloride: 92 mmol/L — ABNORMAL LOW (ref 98–111)
Chloride: 93 mmol/L — ABNORMAL LOW (ref 98–111)
Creatinine, Ser: 1 mg/dL (ref 0.44–1.00)
Creatinine, Ser: 0.9 mg/dL (ref 0.44–1.00)
Glucose, Bld: 96 mg/dL (ref 70–99)
Glucose, Bld: 98 mg/dL (ref 70–99)
HCT: 31 % — ABNORMAL LOW (ref 36.0–46.0)
HCT: 31 % — ABNORMAL LOW (ref 36.0–46.0)
Hemoglobin: 10.5 g/dL — ABNORMAL LOW (ref 12.0–15.0)
Hemoglobin: 10.5 g/dL — ABNORMAL LOW (ref 12.0–15.0)
Potassium: 4.5 mmol/L (ref 3.5–5.1)
Potassium: 4.5 mmol/L (ref 3.5–5.1)
Sodium: 132 mmol/L — ABNORMAL LOW (ref 135–145)
Sodium: 133 mmol/L — ABNORMAL LOW (ref 135–145)
TCO2: 32 mmol/L (ref 22–32)
TCO2: 32 mmol/L (ref 22–32)

## 2021-10-14 LAB — COMPREHENSIVE METABOLIC PANEL
ALT: 152 U/L — ABNORMAL HIGH (ref 0–44)
AST: 121 U/L — ABNORMAL HIGH (ref 15–41)
Albumin: 3.4 g/dL — ABNORMAL LOW (ref 3.5–5.0)
Alkaline Phosphatase: 70 U/L (ref 38–126)
Anion gap: 8 (ref 5–15)
BUN: 14 mg/dL (ref 8–23)
CO2: 29 mmol/L (ref 22–32)
Calcium: 8.4 mg/dL — ABNORMAL LOW (ref 8.9–10.3)
Chloride: 95 mmol/L — ABNORMAL LOW (ref 98–111)
Creatinine, Ser: 1.02 mg/dL — ABNORMAL HIGH (ref 0.44–1.00)
GFR, Estimated: 58 mL/min — ABNORMAL LOW (ref >60)
Glucose, Bld: 91 mg/dL (ref 70–99)
Potassium: 4.3 mmol/L (ref 3.5–5.1)
Sodium: 132 mmol/L — ABNORMAL LOW (ref 135–145)
Total Bilirubin: 1.6 mg/dL — ABNORMAL HIGH (ref 0.3–1.2)
Total Protein: 5.5 g/dL — ABNORMAL LOW (ref 6.5–8.1)

## 2021-10-14 LAB — CBC
HCT: 31.3 % — ABNORMAL LOW (ref 36.0–46.0)
Hemoglobin: 10 g/dL — ABNORMAL LOW (ref 12.0–15.0)
MCH: 27.6 pg (ref 26.0–34.0)
MCHC: 31.9 g/dL (ref 30.0–36.0)
MCV: 86.5 fL (ref 80.0–100.0)
Platelets: 128 10*3/uL — ABNORMAL LOW (ref 150–400)
RBC: 3.62 MIL/uL — ABNORMAL LOW (ref 3.87–5.11)
RDW: 16.3 % — ABNORMAL HIGH (ref 11.5–15.5)
WBC: 4 10*3/uL (ref 4.0–10.5)
nRBC: 0 % (ref 0.0–0.2)

## 2021-10-14 LAB — POCT I-STAT 7, (LYTES, BLD GAS, ICA,H+H)
Acid-Base Excess: 10 mmol/L — ABNORMAL HIGH (ref 0.0–2.0)
Bicarbonate: 33.4 mmol/L — ABNORMAL HIGH (ref 20.0–28.0)
Calcium, Ion: 1.11 mmol/L — ABNORMAL LOW (ref 1.15–1.40)
HCT: 33 % — ABNORMAL LOW (ref 36.0–46.0)
Hemoglobin: 11.2 g/dL — ABNORMAL LOW (ref 12.0–15.0)
O2 Saturation: 100 %
Potassium: 4.6 mmol/L (ref 3.5–5.1)
Sodium: 132 mmol/L — ABNORMAL LOW (ref 135–145)
TCO2: 35 mmol/L — ABNORMAL HIGH (ref 22–32)
pCO2 arterial: 41.4 mmHg (ref 32.0–48.0)
pH, Arterial: 7.515 — ABNORMAL HIGH (ref 7.350–7.450)
pO2, Arterial: 154 mmHg — ABNORMAL HIGH (ref 83.0–108.0)

## 2021-10-14 LAB — MAGNESIUM: Magnesium: 1.8 mg/dL (ref 1.7–2.4)

## 2021-10-14 SURGERY — IMPLANTATION, AORTIC VALVE, TRANSCATHETER, FEMORAL APPROACH
Anesthesia: Monitor Anesthesia Care | Site: Chest

## 2021-10-14 MED ORDER — HEPARIN 6000 UNIT IRRIGATION SOLUTION
Status: DC | PRN
  Administered 2021-10-14: 3

## 2021-10-14 MED ORDER — CEFAZOLIN SODIUM-DEXTROSE 2-4 GM/100ML-% IV SOLN
2.0000 g | Freq: Three times a day (TID) | INTRAVENOUS | Status: AC
  Administered 2021-10-14 (×2): 2 g via INTRAVENOUS
  Filled 2021-10-14 (×2): qty 100

## 2021-10-14 MED ORDER — PROPOFOL 500 MG/50ML IV EMUL
INTRAVENOUS | Status: DC | PRN
  Administered 2021-10-14: 30 ug/kg/min via INTRAVENOUS

## 2021-10-14 MED ORDER — LACTATED RINGERS IV SOLN: INTRAVENOUS | Status: DC | PRN

## 2021-10-14 MED ORDER — WARFARIN - PHARMACIST DOSING INPATIENT: Freq: Every day | Status: DC

## 2021-10-14 MED ORDER — NITROGLYCERIN 0.4 MG SL SUBL: 0.4000 mg | SUBLINGUAL_TABLET | SUBLINGUAL | Status: DC | PRN

## 2021-10-14 MED ORDER — PROTAMINE SULFATE 10 MG/ML IV SOLN
INTRAVENOUS | Status: DC | PRN
  Administered 2021-10-14: 10 mg via INTRAVENOUS
  Administered 2021-10-14: 70 mg via INTRAVENOUS

## 2021-10-14 MED ORDER — IODIXANOL 320 MG/ML IV SOLN
INTRAVENOUS | Status: DC | PRN
  Administered 2021-10-14: 08:00:00 40 mL

## 2021-10-14 MED ORDER — OXYCODONE HCL 5 MG PO TABS
5.0000 mg | ORAL_TABLET | ORAL | Status: DC | PRN
Start: 2021-10-14 — End: 2021-10-17
  Administered 2021-10-14: 13:00:00 5 mg via ORAL
  Administered 2021-10-14 (×2): 10 mg via ORAL
  Administered 2021-10-15 (×3): 5 mg via ORAL
  Administered 2021-10-15: 02:00:00 10 mg via ORAL
  Administered 2021-10-15 – 2021-10-16 (×2): 5 mg via ORAL
  Administered 2021-10-16: 21:00:00 10 mg via ORAL
  Administered 2021-10-16 (×2): 5 mg via ORAL
  Administered 2021-10-17 (×3): 10 mg via ORAL
  Filled 2021-10-14 (×5): qty 1
  Filled 2021-10-14: qty 2
  Filled 2021-10-14 (×3): qty 1
  Filled 2021-10-14 (×6): qty 2

## 2021-10-14 MED ORDER — SODIUM CHLORIDE 0.9 % IV SOLN: INTRAVENOUS | Status: AC

## 2021-10-14 MED ORDER — MORPHINE SULFATE (PF) 2 MG/ML IV SOLN
1.0000 mg | INTRAVENOUS | Status: DC | PRN
  Administered 2021-10-14: 12:00:00 2 mg via INTRAVENOUS

## 2021-10-14 MED ORDER — ACETAMINOPHEN 650 MG RE SUPP: 650.0000 mg | Freq: Four times a day (QID) | RECTAL | Status: DC | PRN

## 2021-10-14 MED ORDER — ZOLPIDEM TARTRATE 5 MG PO TABS
5.0000 mg | ORAL_TABLET | Freq: Every day | ORAL | Status: DC
Start: 2021-10-14 — End: 2021-10-14

## 2021-10-14 MED ORDER — MORPHINE SULFATE (PF) 2 MG/ML IV SOLN
INTRAVENOUS | Status: AC
  Filled 2021-10-14: qty 1

## 2021-10-14 MED ORDER — LIDOCAINE HCL 1 % IJ SOLN
INTRAMUSCULAR | Status: DC | PRN
  Administered 2021-10-14: 6 mL

## 2021-10-14 MED ORDER — NITROGLYCERIN IN D5W 200-5 MCG/ML-% IV SOLN: 0.0000 ug/min | INTRAVENOUS | Status: DC

## 2021-10-14 MED ORDER — LIDOCAINE HCL 1 % IJ SOLN
INTRAMUSCULAR | Status: AC
  Filled 2021-10-14: qty 20

## 2021-10-14 MED ORDER — HEPARIN 6000 UNIT IRRIGATION SOLUTION
Status: AC
  Filled 2021-10-14: qty 1500

## 2021-10-14 MED ORDER — ONDANSETRON HCL 4 MG/2ML IJ SOLN
4.0000 mg | Freq: Once | INTRAMUSCULAR | Status: AC
  Administered 2021-10-14: 16:00:00 4 mg via INTRAVENOUS
  Filled 2021-10-14: qty 2

## 2021-10-14 MED ORDER — SODIUM CHLORIDE 0.9 % IV SOLN: 250.0000 mL | INTRAVENOUS | Status: DC | PRN

## 2021-10-14 MED ORDER — SODIUM CHLORIDE 0.9% FLUSH
3.0000 mL | Freq: Two times a day (BID) | INTRAVENOUS | Status: DC
  Administered 2021-10-15 – 2021-10-17 (×4): 3 mL via INTRAVENOUS

## 2021-10-14 MED ORDER — HEPARIN SODIUM (PORCINE) 1000 UNIT/ML IJ SOLN
INTRAMUSCULAR | Status: DC | PRN
  Administered 2021-10-14: 8000 [IU] via INTRAVENOUS

## 2021-10-14 MED ORDER — PHENYLEPHRINE 40 MCG/ML (10ML) SYRINGE FOR IV PUSH (FOR BLOOD PRESSURE SUPPORT)
PREFILLED_SYRINGE | INTRAVENOUS | Status: DC | PRN
  Administered 2021-10-14: 120 ug via INTRAVENOUS

## 2021-10-14 MED ORDER — SODIUM CHLORIDE 0.9% FLUSH: 3.0000 mL | INTRAVENOUS | Status: DC | PRN

## 2021-10-14 MED ORDER — ONDANSETRON HCL 4 MG/2ML IJ SOLN
INTRAMUSCULAR | Status: DC | PRN
  Administered 2021-10-14: 4 mg via INTRAVENOUS

## 2021-10-14 MED ORDER — SODIUM CHLORIDE 0.9 % IV SOLN: 250.0000 mL | INTRAVENOUS | Status: DC

## 2021-10-14 MED ORDER — WARFARIN SODIUM 5 MG PO TABS
5.0000 mg | ORAL_TABLET | Freq: Once | ORAL | Status: AC
  Administered 2021-10-14: 16:00:00 5 mg via ORAL
  Filled 2021-10-14 (×2): qty 1

## 2021-10-14 MED ORDER — ACETAMINOPHEN 325 MG PO TABS
650.0000 mg | ORAL_TABLET | Freq: Four times a day (QID) | ORAL | Status: DC | PRN
  Administered 2021-10-15: 17:00:00 650 mg via ORAL
  Filled 2021-10-14 (×2): qty 2

## 2021-10-14 MED ORDER — FUROSEMIDE 40 MG PO TABS: 40.0000 mg | ORAL_TABLET | Freq: Two times a day (BID) | ORAL | Status: DC

## 2021-10-14 SURGICAL SUPPLY — 61 items
BAG COUNTER SPONGE SURGICOUNT (BAG) ×3 IMPLANT
BAG DECANTER FOR FLEXI CONT (MISCELLANEOUS) IMPLANT
BAG SURGICOUNT SPONGE COUNTING (BAG) ×1
BLADE CLIPPER SURG (BLADE) IMPLANT
BLADE STERNUM SYSTEM 6 (BLADE) IMPLANT
CABLE ADAPT CONN TEMP 6FT (ADAPTER) ×4 IMPLANT
CANISTER SUCT 3000ML PPV (MISCELLANEOUS) IMPLANT
CATH DIAG EXPO 6F AL1 (CATHETERS) IMPLANT
CATH DIAG EXPO 6F VENT PIG 145 (CATHETERS) ×8 IMPLANT
CATH INFINITI 6F AL2 (CATHETERS) IMPLANT
CATH S G BIP PACING (CATHETERS) ×4 IMPLANT
CHLORAPREP W/TINT 26 (MISCELLANEOUS) ×4 IMPLANT
CLOSURE MYNX CONTROL 6F/7F (Vascular Products) ×2 IMPLANT
CNTNR URN SCR LID CUP LEK RST (MISCELLANEOUS) ×4 IMPLANT
CONT SPEC 4OZ STRL OR WHT (MISCELLANEOUS) ×4
COVER BACK TABLE 80X110 HD (DRAPES) IMPLANT
DECANTER SPIKE VIAL GLASS SM (MISCELLANEOUS) ×4 IMPLANT
DERMABOND ADVANCED (GAUZE/BANDAGES/DRESSINGS) ×2
DERMABOND ADVANCED .7 DNX12 (GAUZE/BANDAGES/DRESSINGS) ×2 IMPLANT
DEVICE CLOSURE PERCLS PRGLD 6F (VASCULAR PRODUCTS) ×4 IMPLANT
DRSG TEGADERM 4X4.75 (GAUZE/BANDAGES/DRESSINGS) ×8 IMPLANT
ELECT REM PT RETURN 9FT ADLT (ELECTROSURGICAL) ×4
ELECTRODE REM PT RTRN 9FT ADLT (ELECTROSURGICAL) ×2 IMPLANT
GAUZE SPONGE 4X4 12PLY STRL (GAUZE/BANDAGES/DRESSINGS) ×4 IMPLANT
GAUZE SPONGE 4X4 12PLY STRL LF (GAUZE/BANDAGES/DRESSINGS) ×4 IMPLANT
GLOVE SURG ENC MOIS LTX SZ7.5 (GLOVE) IMPLANT
GLOVE SURG ENC MOIS LTX SZ8 (GLOVE) IMPLANT
GLOVE SURG ORTHO LTX SZ7.5 (GLOVE) IMPLANT
GOWN STRL REUS W/ TWL LRG LVL3 (GOWN DISPOSABLE) IMPLANT
GOWN STRL REUS W/ TWL XL LVL3 (GOWN DISPOSABLE) ×2 IMPLANT
GOWN STRL REUS W/TWL LRG LVL3 (GOWN DISPOSABLE)
GOWN STRL REUS W/TWL XL LVL3 (GOWN DISPOSABLE) ×2
GUIDEWIRE SAF TJ AMPL .035X180 (WIRE) ×4 IMPLANT
GUIDEWIRE SAFE TJ AMPLATZ EXST (WIRE) ×4 IMPLANT
KIT BASIN OR (CUSTOM PROCEDURE TRAY) ×4 IMPLANT
KIT HEART LEFT (KITS) ×4 IMPLANT
KIT SAPIAN 3 ULTRA RESILIA 23 (Valve) ×2 IMPLANT
KIT TURNOVER KIT B (KITS) ×4 IMPLANT
NS IRRIG 1000ML POUR BTL (IV SOLUTION) ×4 IMPLANT
PACK ENDO MINOR (CUSTOM PROCEDURE TRAY) ×4 IMPLANT
PAD ARMBOARD 7.5X6 YLW CONV (MISCELLANEOUS) ×8 IMPLANT
PAD ELECT DEFIB RADIOL ZOLL (MISCELLANEOUS) ×4 IMPLANT
PERCLOSE PROGLIDE 6F (VASCULAR PRODUCTS) ×12
POSITIONER HEAD DONUT 9IN (MISCELLANEOUS) ×4 IMPLANT
SET MICROPUNCTURE 5F STIFF (MISCELLANEOUS) ×4 IMPLANT
SHEATH BRITE TIP 7FR 35CM (SHEATH) ×4 IMPLANT
SHEATH PINNACLE 6F 10CM (SHEATH) ×4 IMPLANT
SHEATH PINNACLE 8F 10CM (SHEATH) ×4 IMPLANT
SLEEVE REPOSITIONING LENGTH 30 (MISCELLANEOUS) ×4 IMPLANT
STOPCOCK MORSE 400PSI 3WAY (MISCELLANEOUS) ×8 IMPLANT
SUT PROLENE 6 0 C 1 30 (SUTURE) IMPLANT
SUT SILK  1 MH (SUTURE) ×2
SUT SILK 1 MH (SUTURE) ×2 IMPLANT
SYR 50ML LL SCALE MARK (SYRINGE) ×4 IMPLANT
SYR BULB IRRIG 60ML STRL (SYRINGE) IMPLANT
TOWEL GREEN STERILE (TOWEL DISPOSABLE) ×8 IMPLANT
TRANSDUCER W/STOPCOCK (MISCELLANEOUS) ×8 IMPLANT
TRAY FOLEY SLVR 14FR TEMP STAT (SET/KITS/TRAYS/PACK) IMPLANT
TUBE SUCT INTRACARD DLP 20F (MISCELLANEOUS) IMPLANT
WIRE EMERALD 3MM-J .035X150CM (WIRE) ×4 IMPLANT
WIRE EMERALD 3MM-J .035X260CM (WIRE) ×4 IMPLANT

## 2021-10-14 NOTE — Interval H&P Note (Signed)
History and Physical Interval Note:  10/14/2021 7:01 AM  [name] [name]  has presented today for surgery, with the diagnosis of Severe Aortic Stenosis.  The various methods of treatment have been discussed with the patient and family. After consideration of risks, benefits and other options for treatment, the patient has consented to  Procedure(s): TRANSCATHETER AORTIC VALVE REPLACEMENT, TRANSFEMORAL (N/A) INTRAOPERATIVE TRANSTHORACIC ECHOCARDIOGRAM (N/A) as a surgical intervention.  The patient's history has been reviewed, patient examined, no change in status, stable for surgery.  I have reviewed the patient's chart and labs.  Questions were answered to the patient's satisfaction.     Alleen Borne

## 2021-10-14 NOTE — Progress Notes (Signed)
ANTICOAGULATION CONSULT NOTE  Pharmacy Consult for warfarin Indication: atrial fibrillation/MVR  Allergies  Allergen Reactions   Atenolol Other (See Comments)    HR and pulse dropped   Metoprolol Succinate [Metoprolol] Other (See Comments)    HR and pulse dropped   Baclofen Anxiety and Other (See Comments)    Makes the patient feel anxious the day after taking this   Latex Rash    Patient Measurements: Height: 4\' 10"  (147.3 cm) Weight: 53.8 kg (118 lb 9.7 oz) IBW/kg (Calculated) : 40.9 Heparin Dosing Weight: 52.4 kg  Vital Signs: Temp: 97.7 F (36.5 C) (12/27 1046) Temp Source: Temporal (12/27 1046) BP: 109/53 (12/27 1046) Pulse Rate: 0 (12/27 1051)  Labs: Recent Labs    10/12/21 0131 10/12/21 0849 10/12/21 1744 10/13/21 0302 10/13/21 1609 10/14/21 0449 10/14/21 1025  HGB 10.0*  --   --  11.1*  --  10.0* 10.5*  HCT 31.7*  --   --  34.7*  --  31.3* 31.0*  PLT 165  --   --  168  --  128*  --   LABPROT  --   --   --   --  15.8*  --   --   INR  --   --   --   --  1.3*  --   --   HEPARINUNFRC  --    < > 0.53 0.68  --  0.60  --   CREATININE 1.03*  --   --  0.96  --  1.02* 0.90   < > = values in this interval not displayed.     Estimated Creatinine Clearance: 39.9 mL/min (by C-G formula based on SCr of 0.9 mg/dL).   Medical History: Past Medical History:  Diagnosis Date   Anxiety    Aortic aneurysm [facility])    Atrial arrhythmia    With prior history of atrial flutter ablation   Bradycardia    s/p pacemaker implantation   CONGESTIVE HEART FAILURE 07/31/2010   Qualifier: Diagnosis of  By: 08/02/2010, MD, Graciela Husbands    GERD (gastroesophageal reflux disease)    Hypertension    Hypothyroidism    Long Q-T syndrome    Myocardial infarct (HCC) 11/20/2011   Stent placement for 100% blockage of LAD   PACEMAKER, PERMANENT 2009   Medtronic Adapta serial # [id number]   Rheumatic heart disease    With mitral valve replacement, tricuspid regurgitation moderate to  severe, significant left atrial enlargment   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 37m and Dr. Excell Seltzer   TRICUSPID REGURGITATION 12/23/2010   Qualifier: Diagnosis of  By: 02/22/2011, MD, Graciela Husbands    Unspecified essential hypertension 07/31/2010   Qualifier: Diagnosis of  By: 08/02/2010, MD, Graciela Husbands    Assessment: 75 yo female admitted for TAVR. She was previously anticoagulated on warfarin which was held preadmit (pt on enoxaparin bridge). Heparin started while admited.  Pt s/p TAVR 12/27. Pharmacy asked to resume warfarin 12/27 pm. No heparin immediately postop for now. INR 1.3 yesterday. Prior PTA dose was 7.5mg  daily.   Goal of Therapy:  Heparin level 0.3-0.7 units/ml Monitor platelets by anticoagulation protocol: Yes   Plan:  Warfarin 5mg  tonight Daily INR F/U plans to resume heparin  1/28, PharmD, BCPS, [facility] Clinical Pharmacist [phone] Please check AMION for all [facility] Pharmacy numbers 10/14/2021

## 2021-10-14 NOTE — Transfer of Care (Signed)
Immediate Anesthesia Transfer of Care Note  Patient: [name] [name]  Procedure(s) Performed: TRANSCATHETER AORTIC VALVE REPLACEMENT, TRANSFEMORAL (Chest) INTRAOPERATIVE TRANSTHORACIC ECHOCARDIOGRAM  Patient Location: Cath Lab  Anesthesia Type:MAC  Level of Consciousness: awake, alert  and oriented  Airway & Oxygen Therapy: Patient Spontanous Breathing and Patient connected to nasal cannula oxygen  Post-op Assessment: Report given to RN and Post -op Vital signs reviewed and stable  Post vital signs: Reviewed and stable  Last Vitals:  Vitals Value Taken Time  BP 111/65 10/14/21 1002  Temp 36.3 C 10/14/21 1002  Pulse 64 10/14/21 1002  Resp 11 10/14/21 1002  SpO2 94     Last Pain:  Vitals:   10/14/21 1002  TempSrc: Temporal  PainSc: 0-No pain         Complications: No notable events documented.

## 2021-10-14 NOTE — Progress Notes (Signed)
Mobility Specialist Progress Note    10/14/21 1421  Mobility  Activity Ambulated in hall  Level of Assistance Contact guard assist, steadying assist  Assistive Device Front wheel walker  Distance Ambulated (ft) 100 ft  Mobility Ambulated with assistance in hallway  Mobility Response Tolerated well  Mobility performed by Mobility specialist  Bed Position Chair  $Mobility charge 1 Mobility   Pre-Mobility: 63 HR, 124/76 BP, 96% SpO2 During Mobility: 85% SpO2 Post-mobility: 65 HR, 91% SpO2  Pt received in bed and agreeable. Pt ambulated on RA and remained in the high 80s with encouraged focus on pursed lip breathing. C/o back pain. Left in chair with call bell in reach on RA and RN notified.   [facility] Mobility Specialist  M.S. Primary Phone: [phone] M.S. Secondary Phone: [phone]

## 2021-10-14 NOTE — Progress Notes (Signed)
  [facility]   MULTIDISCIPLINARY HEART VALVE TEAM  Patient doing well s/p TAVR. She is hemodynamically stable. Groin sites stable. ECG with no high grade block. Arterial line discontinued and transferred to 4E.  Plan for early ambulation after bedrest completed and hopeful discharge over the next 24-48 hours.   Inga Noller NP-C Structural Heart Team  Pager: [phone] Phone: [phone]  

## 2021-10-14 NOTE — Anesthesia Procedure Notes (Signed)
Procedure Name: MAC Date/Time: 10/14/2021 7:45 AM Performed by: Griffin Dakin, CRNA Pre-anesthesia Checklist: Emergency Drugs available, Patient identified, Suction available, Patient being monitored and Timeout performed Patient Re-evaluated:Patient Re-evaluated prior to induction Oxygen Delivery Method: Simple face mask Induction Type: IV induction Placement Confirmation: positive ETCO2 and breath sounds checked- equal and bilateral Dental Injury: Teeth and Oropharynx as per pre-operative assessment

## 2021-10-14 NOTE — Anesthesia Procedure Notes (Signed)
Arterial Line Insertion Start/End12/27/2022 7:15 AM, 10/14/2021 7:20 AM Performed by: Achille Rich, MD, anesthesiologist  Patient location: Pre-op. Preanesthetic checklist: patient identified, IV checked, site marked, risks and benefits discussed, surgical consent, monitors and equipment checked, pre-op evaluation, timeout performed and anesthesia consent Lidocaine 1% used for infiltration Right, radial was placed Catheter size: 20 G Hand hygiene performed  and maximum sterile barriers used   Attempts: 1 Procedure performed using ultrasound guided technique. Ultrasound Notes:anatomy identified, needle tip was noted to be adjacent to the nerve/plexus identified and no ultrasound evidence of intravascular and/or intraneural injection Following insertion, dressing applied and Biopatch. Post procedure assessment: normal and unchanged

## 2021-10-14 NOTE — Anesthesia Preprocedure Evaluation (Signed)
Anesthesia Evaluation  Patient identified by MRN, date of birth, ID band Patient awake    Reviewed: Allergy & Precautions, H&P , NPO status , Patient's Chart, lab work & pertinent test results  Airway Mallampati: II   Neck ROM: full    Dental   Pulmonary neg pulmonary ROS,    breath sounds clear to auscultation       Cardiovascular hypertension, + CAD, + Past MI, + Cardiac Stents and +CHF  + Valvular Problems/Murmurs AS  Rhythm:regular Rate:Normal  S/p mitral valve replacement 2016.  Severe AS.   Neuro/Psych PSYCHIATRIC DISORDERS Anxiety    GI/Hepatic GERD  ,  Endo/Other  Hypothyroidism   Renal/GU      Musculoskeletal   Abdominal   Peds  Hematology  (+) anemia ,   Anesthesia Other Findings   Reproductive/Obstetrics                             Anesthesia Physical Anesthesia Plan  ASA: 3  Anesthesia Plan: MAC   Post-op Pain Management:    Induction: Intravenous  PONV Risk Score and Plan: 2 and Ondansetron, Dexamethasone, Propofol infusion and Treatment may vary due to age or medical condition  Airway Management Planned: Simple Face Mask  Additional Equipment:   Intra-op Plan:   Post-operative Plan:   Informed Consent:     Dental advisory given  Plan Discussed with: CRNA, Anesthesiologist and Surgeon  Anesthesia Plan Comments:         Anesthesia Quick Evaluation

## 2021-10-14 NOTE — Progress Notes (Signed)
°  Echocardiogram 2D Echocardiogram limited has been performed.  Leta Jungling M 10/14/2021, 9:33 AM

## 2021-10-14 NOTE — Op Note (Signed)
[facility]   MULTIDISCIPLINARY HEART VALVE TEAM   TAVR OPERATIVE NOTE   Date of Procedure:  10/14/2021  Preoperative Diagnosis: Severe Aortic Stenosis   Postoperative Diagnosis: Same   Procedure:   Transcatheter Aortic Valve Replacement - Percutaneous Right Transfemoral Approach  Edwards Sapien 3 Ultra Resilia THV (size 23 mm, model # 9755RSL, serial # [id number])   Co-Surgeons:  Gaye Pollack, MD and Sherren Mocha, MD    Anesthesiologist:  Albertha Ghee, MD  Echocardiographer:  Jenkins Rouge, MD  Pre-operative Echo Findings: Severe aortic stenosis Moderate left ventricular systolic dysfunction  Post-operative Echo Findings: Trace paravalvular leak Unchanged moderate left ventricular systolic dysfunction   BRIEF CLINICAL NOTE AND INDICATIONS FOR SURGERY  The patient is a 75 year old woman with a history of rheumatic heart disease status post mechanical mitral valve replacement about 20 years ago at [facility] by Dr. Anne Shutter. She has been treated with long-term Coumadin for her mechanical valve and PAF. She had a permanent pacemaker placed in 2009. She also has a history of coronary disease with MI and prior stenting. She now presents with progressive exertional shortness of breath and fatigue as well as lower extremity edema. Her most recent echo in October 2022 showed reduced ejection fraction of 30 to 35%. The aortic valve was severely calcified with restricted leaflet mobility with a mean gradient of 53 mmHg and a valve area of 0.5 cm consistent with critical aortic stenosis. There was mild to moderate aortic insufficiency. She started her TAVR work-up and was supposed to see me yesterday in the office after her scans but was unable to lay flat on the CT scan table and was felt to be volume overloaded and admitted for intravenous diuresis. She feels much better. Her gated cardiac CTA this morning shows anatomy suitable for a 26 mm SAPIEN 3 valve. There was a  patent stent in the LAD. There is diffuse calcification of the ascending aorta particularly medially that extends from the sinotubular junction up to the aortic arch. Her abdominal and pelvic CTA shows adequate pelvic vascular anatomy to allow transfemoral insertion. She is scheduled for cardiac catheterization tomorrow. I agree that aortic valve replacement is indicated in this patient for relief of her symptoms and to prevent further left ventricular deterioration. I do not think she is a candidate for open surgical AVR given the diffuse calcification of her ascending aorta as well as her age with prior mitral valve replacement. I think TAVR would be a reasonable alternative for treating her.  The patient was counseled at length regarding treatment alternatives for management of severe symptomatic aortic stenosis. The risks and benefits of surgical intervention has been discussed in detail. Long-term prognosis with medical therapy was discussed. Alternative approaches such as conventional surgical aortic valve replacement, transcatheter aortic valve replacement, and palliative medical therapy were compared and contrasted at length. This discussion was placed in the context of the patient's own specific clinical presentation and past medical history. All of her questions have been addressed.  Following the decision to proceed with transcatheter aortic valve replacement, a discussion was held regarding what types of management strategies would be attempted intraoperatively in the event of life-threatening complications, including whether or not the patient would be considered a candidate for the use of cardiopulmonary bypass and/or conversion to open sternotomy for attempted surgical intervention. She is not a candidate for emergent sternotomy to manage any intraoperative complications. The patient is aware of the fact that transient use of cardiopulmonary bypass may be  necessary. The patient has been advised of a  variety of complications that might develop including but not limited to risks of death, stroke, paravalvular leak, aortic dissection or other major vascular complications, aortic annulus rupture, device embolization, cardiac rupture or perforation, mitral regurgitation, acute myocardial infarction, arrhythmia, heart block or bradycardia requiring permanent pacemaker placement, congestive heart failure, respiratory failure, renal failure, pneumonia, infection, other late complications related to structural valve deterioration or migration, or other complications that might ultimately cause a temporary or permanent loss of functional independence or other long term morbidity. The patient provides full informed consent for the procedure as described and all questions were answered.     DETAILS OF THE OPERATIVE PROCEDURE  PREPARATION:    The patient was brought to the operating room on the above mentioned date and appropriate monitoring was established by the anesthesia team. The patient was placed in the supine position on the operating table.  Intravenous antibiotics were administered. The patient was monitored closely throughout the procedure under conscious sedation.   Baseline transthoracic echocardiogram was performed. The patient's abdomen and both groins were prepped and draped in a sterile manner. A time out procedure was performed.   PERIPHERAL ACCESS:    Using the modified Seldinger technique, femoral arterial and venous access was obtained with placement of 6 Fr sheaths on the left side.  A pigtail diagnostic catheter was passed through the left arterial sheath under fluoroscopic guidance into the aortic root.  A temporary transvenous pacemaker catheter was passed through the left femoral venous sheath under fluoroscopic guidance into the right ventricle.  The pacemaker was tested to ensure stable lead placement and pacemaker capture. Aortic root angiography was performed in order to determine  the optimal angiographic angle for valve deployment.   TRANSFEMORAL ACCESS:   Percutaneous transfemoral access and sheath placement was performed using ultrasound guidance.  The right common femoral artery was cannulated using a micropuncture needle and appropriate location was verified using hand injection angiogram.  A pair of Abbott Perclose percutaneous closure devices were placed and a 6 French sheath replaced into the femoral artery.  The patient was heparinized systemically and ACT verified > 250 seconds.    A 14 Fr transfemoral E-sheath was introduced into the right common femoral artery after progressively dilating over an Amplatz superstiff wire. An AL-1 catheter was used to direct a straight-tip exchange length wire across the native aortic valve into the left ventricle. This was exchanged out for a pigtail catheter and position was confirmed in the LV apex. Simultaneous LV and Ao pressures were recorded.  The pigtail catheter was exchanged for an Amplatz Extra-stiff wire in the LV apex.    BALLOON AORTIC VALVULOPLASTY:   Not performed   TRANSCATHETER HEART VALVE DEPLOYMENT:   An Edwards Sapien 3 Ultra transcatheter heart valve (size 23 mm) was prepared and crimped per manufacturer's guidelines, and the proper orientation of the valve is confirmed on the [employer] delivery system. The valve was advanced through the introducer sheath using normal technique until in an appropriate position in the abdominal aorta beyond the sheath tip. The balloon was then retracted and using the fine-tuning wheel was centered on the valve. The valve was then advanced across the aortic arch using appropriate flexion of the catheter. The valve was carefully positioned across the aortic valve annulus. The Commander catheter was retracted using normal technique. Once final position of the valve has been confirmed by angiographic assessment, the valve is deployed while temporarily holding ventilation and  during rapid ventricular pacing to maintain systolic blood pressure < 50 mmHg and pulse pressure < 10 mmHg. The balloon inflation is held for >3 seconds after reaching full deployment volume. Once the balloon has fully deflated the balloon is retracted into the ascending aorta and valve function is assessed using echocardiography. There is felt to be trace paravalvular leak and no central aortic insufficiency.  The patient's hemodynamic recovery following valve deployment is good.  The deployment balloon and guidewire are both removed.    PROCEDURE COMPLETION:   The sheath was removed and femoral artery closure performed.  Protamine was administered once femoral arterial repair was complete. The temporary pacemaker, pigtail catheters and femoral sheaths were removed with manual pressure used for hemostasis.  A Mynx femoral closure device was utilized following removal of the diagnostic sheath in the left femoral artery.  The patient tolerated the procedure well and is transported to the cath lab recovery area in stable condition. There were no immediate intraoperative complications. All sponge instrument and needle counts are verified correct at completion of the operation.   No blood products were administered during the operation.  The patient received a total of 40 mL of intravenous contrast during the procedure.   Gaye Pollack, MD 10/14/2021

## 2021-10-14 NOTE — Progress Notes (Signed)
[facility]   MULTIDISCIPLINARY HEART VALVE TEAM   TAVR OPERATIVE NOTE   Date of Procedure:  10/14/2021  Preoperative Diagnosis: Severe Aortic Stenosis   Postoperative Diagnosis: Same   Procedure:   Transcatheter Aortic Valve Replacement - Percutaneous Transfemoral Approach  Edwards Sapien 3 Ultra Resilia THV (size 23 mm, serial # [id number])   Co-Surgeons:  Alleen Borne, MD and Tonny Bollman, MD  Anesthesiologist:  Achille Rich, MD  Echocardiographer:  Charlton Haws, MD  Pre-operative Echo Findings: Severe aortic stenosis and moderate AI Moderately severe left ventricular systolic dysfunction  Post-operative Echo Findings: trace paravalvular leak unchanged left ventricular systolic function  BRIEF CLINICAL NOTE AND INDICATIONS FOR SURGERY  75 year old woman with critical aortic stenosis, moderate aortic insufficiency, and history of rheumatic heart disease who has undergone mechanical mitral valve replacement approximately 20 years ago maintained on long-term warfarin.  The patient is status post permanent pacemaker implantation in 2009.  She has developed progressive LV dysfunction with LVEF 30 to 35%, critical aortic stenosis with mean transvalvular gradient of 53 mmHg and aortic valve area less than 0.5 cm, and New York Heart Association functional class III symptoms of progressive acute on chronic systolic heart failure with BNP of 2466 and chest x-ray demonstrating cardiomegaly with bilateral pleural effusions.  After extensive preoperative imaging and multidisciplinary heart team review, she presents for TAVR.  During the course of the patient's preoperative work up they have been evaluated comprehensively by a multidisciplinary team of specialists coordinated through the Multidisciplinary Heart Valve Clinic in the [facility].  They have been demonstrated to suffer from symptomatic severe aortic stenosis as noted above. The  patient has been counseled extensively as to the relative risks and benefits of all options for the treatment of severe aortic stenosis including long term medical therapy, conventional surgery for aortic valve replacement, and transcatheter aortic valve replacement.  The patient has been independently evaluated in formal cardiac surgical consultation by Dr Laneta Simmers, who deemed the patient appropriate for TAVR. Based upon review of all of the patient's preoperative diagnostic tests they are felt to be candidate for transcatheter aortic valve replacement using the transfemoral approach as an alternative to conventional surgery.    Following the decision to proceed with transcatheter aortic valve replacement, a discussion has been held regarding what types of management strategies would be attempted intraoperatively in the event of life-threatening complications, including whether or not the patient would be considered a candidate for the use of cardiopulmonary bypass and/or conversion to open sternotomy for attempted surgical intervention.  The patient has been advised of a variety of complications that might develop peculiar to this approach including but not limited to risks of death, stroke, paravalvular leak, aortic dissection or other major vascular complications, aortic annulus rupture, device embolization, cardiac rupture or perforation, acute myocardial infarction, arrhythmia, heart block or bradycardia requiring permanent pacemaker placement, congestive heart failure, respiratory failure, renal failure, pneumonia, infection, other late complications related to structural valve deterioration or migration, or other complications that might ultimately cause a temporary or permanent loss of functional independence or other long term morbidity.  The patient provides full informed consent for the procedure as described and all questions were answered preoperatively.  DETAILS OF THE OPERATIVE  PROCEDURE  PREPARATION:   The patient is brought to the operating room on the above mentioned date and central monitoring was established by the anesthesia team including placement of a radial arterial line. The patient is placed in the  supine position on the operating table.  Intravenous antibiotics are administered. The patient is monitored closely throughout the procedure under conscious sedation.  Baseline transthoracic echocardiogram is performed. The patient's chest, abdomen, both groins, and both lower extremities are prepared and draped in a sterile manner. A time out procedure is performed.   PERIPHERAL ACCESS:   Using ultrasound guidance, femoral arterial and venous access is obtained with placement of 6 Fr sheaths on the left side.  [place] images are digitally captured and stored in the patient's chart. A pigtail diagnostic catheter was passed through the femoral arterial sheath under fluoroscopic guidance into the aortic root.  A temporary transvenous pacemaker catheter was passed through the femoral venous sheath under fluoroscopic guidance into the right ventricle.  The pacemaker was tested to ensure stable lead placement and pacemaker capture. Aortic root angiography was performed in order to determine the optimal angiographic angle for valve deployment.  TRANSFEMORAL ACCESS:  A micropuncture technique is used to access the right femoral artery under fluoroscopic and ultrasound guidance.  2 Perclose devices are deployed at 10' and 2' positions to 'PreClose' the femoral artery. An 8 French sheath is placed and then an Amplatz Superstiff wire is advanced through the sheath. This is changed out for a 14 French transfemoral E-Sheath after progressively dilating over the Superstiff wire.  An AL-1 catheter was used to direct a straight-tip exchange length wire across the native aortic valve into the left ventricle. This was exchanged out for a pigtail catheter and position was confirmed in the LV  apex. Simultaneous LV and Ao pressures were recorded.  The pigtail catheter was exchanged for an Amplatz Extra-stiff wire in the LV apex.    BALLOON AORTIC VALVULOPLASTY:  Not performed  TRANSCATHETER HEART VALVE DEPLOYMENT:  An Edwards Sapien 3 ultra Resilia transcatheter heart valve (size 23 mm) was prepared and crimped per manufacturer's guidelines, and the proper orientation of the valve is confirmed on the [employer] delivery system. The valve was advanced through the introducer sheath using normal technique until in an appropriate position in the abdominal aorta beyond the sheath tip. The balloon was then retracted and using the fine-tuning wheel was centered on the valve. The valve was then advanced across the aortic arch using appropriate flexion of the catheter. The valve was carefully positioned across the aortic valve annulus. The Commander catheter was retracted using normal technique. Once final position of the valve has been confirmed by angiographic assessment, the valve is deployed while temporarily holding ventilation and during rapid ventricular pacing to maintain systolic blood pressure < 50 mmHg and pulse pressure < 10 mmHg. The balloon inflation is held for >3 seconds after reaching full deployment volume. Once the balloon has fully deflated the balloon is retracted into the ascending aorta and valve function is assessed using echocardiography. The patient's hemodynamic recovery following valve deployment is good.  The deployment balloon and guidewire are both removed. Echo demostrated acceptable post-procedural gradients, stable mitral valve function, and mild aortic insufficiency.  There were 2 distinct paravalvular leaks.  An additional cc of volume was added to the atrion device and the valve was postdilated under rapid ventricular pacing.  This significantly improved the mild paravalvular regurgitation and reduced it to trace.   PROCEDURE COMPLETION:  The sheath was removed  and femoral artery closure is performed using the 2 previously deployed Perclose devices.  Protamine is administered once femoral arterial repair was complete. The site is clear with no evidence of bleeding or hematoma after the  sutures are tightened. The temporary pacemaker and pigtail catheters are removed. Mynx closure is used for contralateral femoral arterial hemostasis for the 6 Fr sheath.  The patient tolerated the procedure well and is transported to the recovery area in stable condition. There were no immediate intraoperative complications. All sponge instrument and needle counts are verified correct at completion of the operation.   The patient received a total of 40 mL of intravenous contrast during the procedure.  Anticoagulation recommendations: Resume warfarin today.  Avoid postoperative heparin or Lovenox due to excessive bleeding risk.  Sherren Mocha, MD 10/14/2021 9:47 AM

## 2021-10-15 ENCOUNTER — Inpatient Hospital Stay (HOSPITAL_COMMUNITY): Payer: Medicare Other

## 2021-10-15 ENCOUNTER — Other Ambulatory Visit: Payer: Self-pay

## 2021-10-15 ENCOUNTER — Other Ambulatory Visit: Payer: Self-pay | Admitting: Cardiology

## 2021-10-15 ENCOUNTER — Encounter (HOSPITAL_COMMUNITY): Payer: Self-pay | Admitting: Cardiovascular Disease

## 2021-10-15 DIAGNOSIS — Z952 Presence of prosthetic heart valve: Secondary | ICD-10-CM

## 2021-10-15 DIAGNOSIS — I350 Nonrheumatic aortic (valve) stenosis: Secondary | ICD-10-CM

## 2021-10-15 LAB — BASIC METABOLIC PANEL
Anion gap: 11 (ref 5–15)
BUN: 12 mg/dL (ref 8–23)
CO2: 29 mmol/L (ref 22–32)
Calcium: 8.5 mg/dL — ABNORMAL LOW (ref 8.9–10.3)
Chloride: 92 mmol/L — ABNORMAL LOW (ref 98–111)
Creatinine, Ser: 0.97 mg/dL (ref 0.44–1.00)
GFR, Estimated: >60 mL/min (ref >60)
Glucose, Bld: 87 mg/dL (ref 70–99)
Potassium: 3.7 mmol/L (ref 3.5–5.1)
Sodium: 132 mmol/L — ABNORMAL LOW (ref 135–145)

## 2021-10-15 LAB — CBC
HCT: 33.1 % — ABNORMAL LOW (ref 36.0–46.0)
Hemoglobin: 10.3 g/dL — ABNORMAL LOW (ref 12.0–15.0)
MCH: 27.2 pg (ref 26.0–34.0)
MCHC: 31.1 g/dL (ref 30.0–36.0)
MCV: 87.3 fL (ref 80.0–100.0)
Platelets: 96 10*3/uL — ABNORMAL LOW (ref 150–400)
RBC: 3.79 MIL/uL — ABNORMAL LOW (ref 3.87–5.11)
RDW: 16.4 % — ABNORMAL HIGH (ref 11.5–15.5)
WBC: 6 10*3/uL (ref 4.0–10.5)
nRBC: 0 % (ref 0.0–0.2)

## 2021-10-15 LAB — PROTIME-INR
INR: 1.3 — ABNORMAL HIGH (ref 0.8–1.2)
Prothrombin Time: 15.8 seconds — ABNORMAL HIGH (ref 11.4–15.2)

## 2021-10-15 MED ORDER — FUROSEMIDE 40 MG PO TABS
40.0000 mg | ORAL_TABLET | Freq: Two times a day (BID) | ORAL | Status: DC
  Administered 2021-10-15 – 2021-10-16 (×2): 40 mg via ORAL
  Filled 2021-10-15 (×2): qty 1

## 2021-10-15 MED ORDER — WARFARIN SODIUM 7.5 MG PO TABS
7.5000 mg | ORAL_TABLET | Freq: Once | ORAL | Status: AC
  Administered 2021-10-15: 16:00:00 7.5 mg via ORAL
  Filled 2021-10-15: qty 1

## 2021-10-15 MED FILL — Magnesium Sulfate Inj 50%: INTRAMUSCULAR | Qty: 10 | Status: AC

## 2021-10-15 MED FILL — Potassium Chloride Inj 2 mEq/ML: INTRAVENOUS | Qty: 40 | Status: AC

## 2021-10-15 MED FILL — Heparin Sodium (Porcine) Inj 1000 Unit/ML: Qty: 1000 | Status: AC

## 2021-10-15 NOTE — Progress Notes (Signed)
°  Echocardiogram 2D Echocardiogram has been performed.  [name] [name] 10/15/2021, 10:03 AM

## 2021-10-15 NOTE — Care Management Important Message (Signed)
Important Message  Patient Details  Name: [name] [name] MRN: [id number] Date of Birth: July 15, 1946   Medicare Important Message Given:  Yes     Renie Ora 10/15/2021, 10:57 AM

## 2021-10-15 NOTE — Progress Notes (Addendum)
[place] VALVE TEAM  Patient Name: [name] [name] Date of Encounter: 10/15/2021  Primary Cardiologist: Sherren Mocha, MD  Hospital Problem List     Principal Problem:   Severe aortic stenosis Active Problems:   Essential hypertension   Presence of permanent cardiac pacemaker   Thoracic aortic aneurysm   Myocardial infarct [facility])   Rheumatic heart disease   Acute on chronic HFrEF (heart failure with reduced ejection fraction) (HCC)   Chronic anticoagulation   Transaminitis   H/O mitral valve replacement with mechanical valve   S/P TAVR (transcatheter aortic valve replacement)   Subjective   Feeling well this AM. Continues to have chronic back pain. No chest pain, SOB, palpitations.   Inpatient Medications    Scheduled Meds:  docusate sodium  100 mg Oral BID   furosemide  40 mg Intravenous BID   lactose free nutrition  237 mL Oral BID   levothyroxine  75 mcg Oral QAC breakfast   pantoprazole  40 mg Oral Daily   potassium chloride SA  40 mEq Oral Daily   sodium chloride flush  3 mL Intravenous Q12H   Warfarin - Pharmacist Dosing Inpatient   Does not apply q1600   Continuous Infusions:  sodium chloride     sodium chloride     sodium chloride     nitroGLYCERIN     PRN Meds: sodium chloride, sodium chloride, acetaminophen **OR** acetaminophen, albuterol, ALPRAZolam, alum & mag hydroxide-simeth, baclofen, bisacodyl, morphine injection, nitroGLYCERIN, oxyCODONE, sodium chloride flush, zolpidem   Vital Signs    Vitals:   10/14/21 1900 10/14/21 2324 10/15/21 0326 10/15/21 0734  BP: 121/61 (!) 91/53 (!) 112/57 (!) 106/56  Pulse: 65 68 64 71  Resp: 20 20 20 18   Temp: 98.6 F (37 C) 98.7 F (37.1 C) 98.8 F (37.1 C) 98.1 F (36.7 C)  TempSrc: Oral Oral Oral Oral  SpO2: 99% 99% 99% 94%  Weight:   56.2 kg   Height:        Intake/Output Summary (Last 24 hours) at 10/15/2021 0843 Last data filed at 10/15/2021  0615 Gross per 24 hour  Intake 700 ml  Output 700 ml  Net 0 ml   Filed Weights   10/13/21 0415 10/14/21 0342 10/15/21 0326  Weight: 53.8 kg 53.8 kg 56.2 kg    Physical Exam    General: Well developed, well nourished, NAD Neck: Negative for carotid bruits. No JVD Lungs:Clear to ausculation bilaterally. Breathing is unlabored. Cardiovascular: Irregular with S1 S2. + murmur Abdomen: Soft, non-tender, non-distended. No obvious abdominal masses. Extremities: No edema. Neuro: Alert and oriented. No focal deficits. No facial asymmetry. MAE spontaneously. Psych: Responds to questions appropriately with normal affect.    Labs    CBC Recent Labs    10/14/21 0449 10/14/21 0758 10/14/21 1025 10/15/21 0222  WBC 4.0  --   --  6.0  HGB 10.0*   < > 10.5* 10.3*  HCT 31.3*   < > 31.0* 33.1*  MCV 86.5  --   --  87.3  PLT 128*  --   --  96*   < > = values in this interval not displayed.   Basic Metabolic Panel Recent Labs    10/13/21 0302 10/14/21 0449 10/14/21 0758 10/14/21 1025 10/15/21 0222  NA 137 132*   < > 133* 132*  K 3.9 4.3   < > 4.5 3.7  CL 96* 95*   < > 93* 92*  CO2  30 29  --   --  29  GLUCOSE 99 91   < > 98 87  BUN 13 14   < > 17 12  CREATININE 0.96 1.02*   < > 0.90 0.97  CALCIUM 8.7* 8.4*  --   --  8.5*  MG 1.9 1.8  --   --   --    < > = values in this interval not displayed.   Liver Function Tests Recent Labs    10/13/21 0302 10/14/21 0449  AST 223* 121*  ALT 225* 152*  ALKPHOS 83 70  BILITOT 1.2 1.6*  PROT 6.3* 5.5*  ALBUMIN 3.4* 3.4*   No results for input(s): LIPASE, AMYLASE in the last 72 hours. Cardiac Enzymes No results for input(s): CKTOTAL, CKMB, CKMBINDEX, TROPONINI in the last 72 hours. BNP Invalid input(s): POCBNP D-Dimer No results for input(s): DDIMER in the last 72 hours. Hemoglobin A1C No results for input(s): HGBA1C in the last 72 hours. Fasting Lipid Panel No results for input(s): CHOL, HDL, LDLCALC, TRIG, CHOLHDL, LDLDIRECT in  the last 72 hours. Thyroid Function Tests No results for input(s): TSH, T4TOTAL, T3FREE, THYROIDAB in the last 72 hours.  Invalid input(s): FREET3  Telemetry    10/15/21 AV paced, frequent PACs- Personally Reviewed  ECG    10/15/21 NSR with PACs? AV paced with stable rates- Personally Reviewed  Radiology    ECHOCARDIOGRAM LIMITED  Result Date: 10/14/2021    ECHOCARDIOGRAM LIMITED REPORT   Patient Name:   [name] [name] Date of Exam: 10/14/2021 Medical Rec #:  [id number]          Height:       58.0 in Accession #:    [id number]         Weight:       118.6 lb Date of Birth:  08/20/1946          BSA:          1.458 m Patient Age:    75 years           BP:           150/58 mmHg Patient Gender: F                  HR:           49 bpm. Exam Location:  Inpatient Procedure: Limited Echo, Cardiac Doppler, Color Doppler and 3D Echo Indications:    TAVR  History:        Patient has prior history of Echocardiogram examinations. CHF,                 CAD and Previous Myocardial Infarction, Prior CABG and                 Pacemaker, Aortic Valve Disease and Mitral Valve Disease,                 Arrythmias:Bradycardia; Risk Factors:Hypertension. Aortic                 aneurysm. History of rheumatic fever.                 Aortic Valve: 23 mm Sapien prosthetic, stented (TAVR) valve is                 present in the aortic position. Procedure Date: 10/14/2021.  Sonographer:    Darlina Sicilian RDCS Referring Phys: [place]  1. Inferior septal and apical hypokinesis . Left ventricular ejection  fraction, by estimation, is 30 to 35%. The left ventricle has moderately decreased function. The left ventricle demonstrates regional wall motion abnormalities (see scoring diagram/findings for description). The left ventricular internal cavity size was moderately dilated. There is moderate asymmetric left ventricular hypertrophy of the basal and septal segments.  2. Pacing wires RA/RV. Right  ventricular systolic function is moderately reduced. The right ventricular size is moderately enlarged. There is normal pulmonary artery systolic pressure.  3. 27 mm bi leaflet mechanical MVR present . The mitral valve has been repaired/replaced. No evidence of mitral valve regurgitation. Procedure Date: 2002.  4. Tricuspid valve regurgitation is moderate to severe.  5. Pre TAVR: severe AS fused right and left cusp mean gradient 54 peak 89 mmHg     AVA 0.64 cm2         Post TAVR: well positioned 23 mm Sapien 3 valve After initial deployment two mild PVL's one at 10:00 and one at 4:00 on PSLX views The valve was then post dilated with only residual trivial PVL at 12:00 postion mean gradient 5 peak 9 mmHg AVA 3.1 cm2  . Aortic valve regurgitation is moderate. There is a 23 mm Sapien prosthetic (TAVR) valve present in the aortic position. Procedure Date: 10/14/2021. FINDINGS  Left Ventricle: Inferior septal and apical hypokinesis. Left ventricular ejection fraction, by estimation, is 30 to 35%. The left ventricle has moderately decreased function. The left ventricle demonstrates regional wall motion abnormalities. The left ventricular internal cavity size was moderately dilated. There is moderate asymmetric left ventricular hypertrophy of the basal and septal segments. Right Ventricle: Pacing wires RA/RV. The right ventricular size is moderately enlarged. Right ventricular systolic function is moderately reduced. There is normal pulmonary artery systolic pressure. The tricuspid regurgitant velocity is 2.55 m/s, and with an assumed right atrial pressure of 8 mmHg, the estimated right ventricular systolic pressure is [id number]. Mitral Valve: 27 mm bi leaflet mechanical MVR present. The mitral valve has been repaired/replaced. There is a [employer] present in the mitral position. MV peak gradient, 9.1 mmHg. The mean mitral valve gradient is 3.0 mmHg. Tricuspid Valve: Tricuspid valve regurgitation is moderate to severe.  Aortic Valve: Pre TAVR: severe AS fused right and left cusp mean gradient 54 peak 89 mmHg AVA 0.64 cm2 Post TAVR: well positioned 23 mm Sapien 3 valve After initial deployment two mild PVL's one at 10:00 and one at 4:00 on PSLX views The valve was then post dilated with only residual trivial PVL at 12:00 postion mean gradient 5 peak 9 mmHg AVA 3.1 cm2. Aortic valve regurgitation is moderate. Aortic regurgitation PHT measures 334 msec. Aortic valve mean gradient measures 21.7 mmHg. Aortic valve peak gradient measures 27.5 mmHg. Aortic valve area, by VTI measures 1.51 cm. There is a 23 mm Sapien prosthetic, stented (TAVR) valve present in the aortic position. Procedure Date: 10/14/2021. Additional Comments: A device lead is visualized. LEFT VENTRICLE PLAX 2D LVOT diam:     2.30 cm LV SV:         97 LV SV Index:   66 LVOT Area:     4.15 cm                          3D Volume EF:                          3D EF:        35 %  LV EDV:       176 ml                          LV ESV:       114 ml                          LV SV:        61 ml AORTIC VALVE AV Area (Vmax):    2.12 cm AV Area (Vmean):   1.77 cm AV Area (VTI):     1.51 cm AV Vmax:           262.00 cm/s AV Vmean:          185.700 cm/s AV VTI:            0.638 m AV Peak Grad:      27.5 mmHg AV Mean Grad:      21.7 mmHg LVOT Vmax:         133.50 cm/s LVOT Vmean:        79.150 cm/s LVOT VTI:          0.232 m LVOT/AV VTI ratio: 0.36 AI PHT:            334 msec MITRAL VALVE             TRICUSPID VALVE MV Area VTI:  2.84 cm   TR Peak grad:   26.0 mmHg MV Peak grad: 9.1 mmHg   TR Vmax:        255.00 cm/s MV Mean grad: 3.0 mmHg MV Vmax:      1.50 m/s   SHUNTS MV Vmean:     69.6 cm/s  Systemic VTI:  0.23 m                          Systemic Diam: 2.30 cm Charlton Haws MD Electronically signed by Charlton Haws MD Signature Date/Time: 10/14/2021/12:43:06 PM    Final    Structural Heart Procedure  Result Date: 10/14/2021 See surgical note for  result.   Cardiac Studies     TAVR OPERATIVE NOTE     Date of Procedure:                10/14/2021   Preoperative Diagnosis:      Severe Aortic Stenosis    Postoperative Diagnosis:    Same    Procedure:        Transcatheter Aortic Valve Replacement - Percutaneous Right Transfemoral Approach             Edwards Sapien 3 Ultra Resilia THV (size 23 mm, model # 9755RSL, serial # [id number])              Co-Surgeons:                        Alleen Borne, MD and Tonny Bollman, MD     Anesthesiologist:                  Achille Rich, MD   Echocardiographer:              Charlton Haws, MD   Pre-operative Echo Findings: Severe aortic stenosis Moderate left ventricular systolic dysfunction   Post-operative Echo Findings: Trace paravalvular leak Unchanged moderate left ventricular systolic dysfunction   Echo 10/12/21:  1. Left ventricular ejection fraction, by estimation, is 30 to 35%.  The  left ventricle has moderately decreased function. The left ventricle  demonstrates regional wall motion abnormalities. Abnormal (paradoxical)  septal motion, consistent with RV  pacemaker.      The left ventricular internal cavity size was mildly dilated. There is  severe asymmetric left ventricular hypertrophy of the basal-septal  segment. Left ventricular diastolic parameters are indeterminate.   2. Right ventricular systolic function is mildly reduced. The right  ventricular size is normal. There is mildly elevated pulmonary artery  systolic pressure. The estimated right ventricular systolic pressure is  [id number] mmHg.   3. Left atrial size was severely dilated.   4. Right atrial size was mildly dilated.   5. There is a mechanical valve present in the mitral position      No evidence of mitral valve regurgitation. The mean mitral valve  gradient is 3.0 mmHg at 68bpm.   6. Tricuspid valve regurgitation is moderate.   7. The inferior vena cava is normal in size with <50% respiratory   variability, suggesting right atrial pressure of 8 mmHg.   8. The aortic valve is calcified. There is severe calcifcation of the  aortic valve. Aortic valve regurgitation is moderate. Severe aortic  stenosis. Vmax 4.58m/s, MG 53 mmHg, AVA 0.4 cm^2, DI 0.15    Patient Profile     75 y.o. female with a history of critical aortic stenosis s/p TAVR 10/14/21, rheumatic heart disease, mechanical mitral valve replacement 20 years ago on chronic coumadin, PPM, atrial fibrillation who presents for direct admission for heparin bridge prior to TAVR planned on 10/14/2021  Assessment & Plan   Severe AS: s/p successful TAVR with a 23 mm Edwards Sapien 3 Ultra Resilia via the TF approach on 10/14/21. Post operative echo pending today. Groin sites are stable. ECG with AV paced rhythm, irregular but stable rates. Patient restarted on coumadin post procedure. INR today at 1.3. Pharmacy to dose.   Acute on chronic combined systolic and diastolic CHF: Initial plan was to direct admit the patient for Coumadin bridge prior to TAVR on 10/14/21 however on arrival found to be significantly volume overloaded. Repeat echo 12/25 showed EF 30 to 35%, mild RV dysfunction, mechanical mitral valve with no MR and mean gradient 3 mmHg, very severe aortic stenosis (Vmax 4.53m/s, MG 53 mmHg, AVA 0.4 cm^2, DI 0.15). She was started on IV Lasix 40mg  BID. Weight up from baseline at 123.9lb today. I&O, net neg 3.1L. Transition to PO Lasix today and follow response.  Mechanical mitral valve: Started on heparin drip in preparation for TAVR however restarted Coumadin yesterday evening with INR 1.3 today. Follows her own INR levels at home with PCP making dosing adjustments.     Paroxysmal atrial fibrillation: Recently discharged on digoxin for PAF with levels undetectable at <0.2 on admission 10/11/21. Dig currently being held.   Transaminitis: Suspect due to hepatic congestion in setting of decompensated heart failure as above. Right  upper quadrant ultrasound showed hepatic steatosis with slightly nodular border inferiorly that may be associated with underlying cirrhosis. Plan to recheck LFTs in the AM. Last AST/ALT 121/152.   Hyponatremia: Appears chronic with no mental status changes. Likely secondary to diuresis.   Incidental findings: Dilated ascending aorta measuring 4.4 cm. Will continue with routine surveillance.   SignedKathyrn Drown, NP  10/15/2021, 8:43 AM  Pager [phone]   Patient seen, examined. Available data reviewed. Agree with findings, assessment, and plan as outlined by Hale Bogus, NP.  The patient is independently interviewed and examined.  She is alert, oriented, in no distress.  HEENT is normal, JVP is normal, lungs are clear bilaterally, heart is regular rate and rhythm with 2/6 systolic murmur at the right upper sternal border, no diastolic murmur, abdomen is soft and nontender, extremities have no edema, femoral sites are clear with no hematoma or ecchymosis bilaterally.  The patient's postoperative day #1 echo is reviewed and shows normal function of her transcatheter aortic valve with normal gradients and no significant paravalvular regurgitation.  The patient's mitral valve prosthetic function is stable with no mitral regurgitation and a mean transmitral gradient less than 5 mmHg.  Telemetry shows AV sequential pacing.  Continue current management strategies as outlined above with transition to oral Lasix today.  Anticipate hospital discharge tomorrow.  Trend INR.  Sherren Mocha, M.D. 10/15/2021 2:48 PM

## 2021-10-15 NOTE — Progress Notes (Addendum)
ANTICOAGULATION CONSULT NOTE  Pharmacy Consult for warfarin Indication: atrial fibrillation/MVR  Allergies  Allergen Reactions   Atenolol Other (See Comments)    HR and pulse dropped   Metoprolol Succinate [Metoprolol] Other (See Comments)    HR and pulse dropped   Baclofen Anxiety and Other (See Comments)    Makes the patient feel anxious the day after taking this   Latex Rash    Patient Measurements: Height: 4\' 10"  (147.3 cm) Weight: 56.2 kg (123 lb 14.4 oz) IBW/kg (Calculated) : 40.9 Heparin Dosing Weight: 52.4 kg  Vital Signs: Temp: 98.1 F (36.7 C) (12/28 0734) Temp Source: Oral (12/28 0734) BP: 106/56 (12/28 0734) Pulse Rate: 71 (12/28 0734)  Labs: Recent Labs    10/12/21 1744 10/13/21 0302 10/13/21 0302 10/13/21 1609 10/14/21 0449 10/14/21 0758 10/14/21 0802 10/14/21 1025 10/15/21 0222  HGB  --  11.1*   < >  --  10.0* 10.5* 11.2* 10.5* 10.3*  HCT  --  34.7*   < >  --  31.3* 31.0* 33.0* 31.0* 33.1*  PLT  --  168  --   --  128*  --   --   --  96*  LABPROT  --   --   --  15.8*  --   --   --   --  15.8*  INR  --   --   --  1.3*  --   --   --   --  1.3*  HEPARINUNFRC 0.53 0.68  --   --  0.60  --   --   --   --   CREATININE  --  0.96   < >  --  1.02* 1.00  --  0.90 0.97   < > = values in this interval not displayed.     Estimated Creatinine Clearance: 37.8 mL/min (by C-G formula based on SCr of 0.97 mg/dL).   Medical History: Past Medical History:  Diagnosis Date   Anxiety    Aortic aneurysm [facility])    Atrial arrhythmia    With prior history of atrial flutter ablation   Bradycardia    s/p pacemaker implantation   CONGESTIVE HEART FAILURE 07/31/2010   Qualifier: Diagnosis of  By: 08/02/2010, MD, Graciela Husbands    GERD (gastroesophageal reflux disease)    Hypertension    Hypothyroidism    Long Q-T syndrome    Myocardial infarct (HCC) 11/20/2011   Stent placement for 100% blockage of LAD   PACEMAKER, PERMANENT 2009   Medtronic Adapta serial #  [id number]   Rheumatic heart disease    With mitral valve replacement, tricuspid regurgitation moderate to severe, significant left atrial enlargment   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 37m and Dr. Excell Seltzer   TRICUSPID REGURGITATION 12/23/2010   Qualifier: Diagnosis of  By: 02/22/2011, MD, Graciela Husbands    Unspecified essential hypertension 07/31/2010   Qualifier: Diagnosis of  By: 08/02/2010, MD, Graciela Husbands    Assessment: 75 yo [name] admitted for TAVR. She was previously anticoagulated on warfarin for hx mMVR which was held preadmit (pt on enoxaparin bridge pre-op outpatient). Heparin started while admited.  Pt s/p TAVR 12/27. Pharmacy asked to resume warfarin 12/27 pm. No heparin immediately postop for now. INR 1.3 today. Prior PTA dose was 7.5mg  daily.   Goal of Therapy:  INR 2.5-3.5 Monitor platelets by anticoagulation protocol: Yes   Plan:  Warfarin 7.5mg  tonight Daily INR  Fredonia Highland, PharmD, BCPS, [facility] Clinical Pharmacist [phone] Please check AMION for all [facility] Pharmacy numbers 10/15/2021

## 2021-10-15 NOTE — Progress Notes (Signed)
Mobility Specialist: Progress Note   10/15/21 1605  Mobility  Activity Ambulated in hall  Level of Assistance Standby assist, set-up cues, supervision of patient - no hands on  Assistive Device Front wheel walker  Distance Ambulated (ft) 160 ft  Mobility Ambulated with assistance in hallway  Mobility Response Tolerated well  Mobility performed by Mobility specialist  $Mobility charge 1 Mobility   Pre-Mobility: 64 HR, 98% SpO2 During Mobility: 94% SpO2 Post-Mobility: 89 HR, 98% SpO2  Pt on 1.5 L/min [place] in the room and ambulated on 2 L/min [place]. Pt c/o feeling cold throughout, otherwise no other c/o. Pt sitting EOB after walk with call bell at her side.   [facility] Anyi Fels Mobility Specialist Mobility Specialist [address] [place]: [phone] Mobility Specialist [address] [place] [address] [place]: [phone]

## 2021-10-15 NOTE — Progress Notes (Addendum)
CARDIAC REHAB PHASE I   PRE:  Rate/Rhythm: 74 pacing    BP: sitting 141/62    SaO2: 97 4L, 97 2L  MODE:  Ambulation: 75 ft   POST:  Rate/Rhythm: 84 pacing    BP: sitting 139/75     SaO2: 97 2L  Slow pace. Trouble steering RW as it is too big for her. No major c/o. Ambulated on 2L without problems, standby assist. Ambulated a few feet in room without RW as she initially stated her house didn't have room for RW. Later pt sts it is big enough and a RW or rollator would be helpful, especially in open spaces. She is unsure which she would like, I encouraged her to discuss with her daughter. Pt has basic understanding of daily wts but did not know much about low sodium diet. Discussed with her however she is tangential with her own thoughts. It appears she prefer to learn by reading therefore left materials and encouraged her to read them today. Not interested in CRPII.  Turned pts O2 down in room. Was intially 4L. Ambulated on 2L. Decreased to RA on EOB. SaO2 90-92 RA. However pt planned to lay down, therefore placed on 1L. She sts she has O2 prn at home. [id number]   Harriet Masson CES, ACSM 10/15/2021 11:33 AM

## 2021-10-16 ENCOUNTER — Inpatient Hospital Stay (HOSPITAL_COMMUNITY): Payer: Medicare Other

## 2021-10-16 ENCOUNTER — Other Ambulatory Visit: Payer: Self-pay

## 2021-10-16 DIAGNOSIS — I5023 Acute on chronic systolic (congestive) heart failure: Secondary | ICD-10-CM

## 2021-10-16 DIAGNOSIS — Z952 Presence of prosthetic heart valve: Secondary | ICD-10-CM

## 2021-10-16 LAB — CBC
HCT: 29.1 % — ABNORMAL LOW (ref 36.0–46.0)
Hemoglobin: 9.1 g/dL — ABNORMAL LOW (ref 12.0–15.0)
MCH: 27.2 pg (ref 26.0–34.0)
MCHC: 31.3 g/dL (ref 30.0–36.0)
MCV: 87.1 fL (ref 80.0–100.0)
Platelets: 79 10*3/uL — ABNORMAL LOW (ref 150–400)
RBC: 3.34 MIL/uL — ABNORMAL LOW (ref 3.87–5.11)
RDW: 16.4 % — ABNORMAL HIGH (ref 11.5–15.5)
WBC: 5.1 10*3/uL (ref 4.0–10.5)
nRBC: 0 % (ref 0.0–0.2)

## 2021-10-16 LAB — BASIC METABOLIC PANEL
Anion gap: 9 (ref 5–15)
BUN: 13 mg/dL (ref 8–23)
CO2: 29 mmol/L (ref 22–32)
Calcium: 8.2 mg/dL — ABNORMAL LOW (ref 8.9–10.3)
Chloride: 91 mmol/L — ABNORMAL LOW (ref 98–111)
Creatinine, Ser: 0.93 mg/dL (ref 0.44–1.00)
GFR, Estimated: >60 mL/min (ref >60)
Glucose, Bld: 89 mg/dL (ref 70–99)
Potassium: 4 mmol/L (ref 3.5–5.1)
Sodium: 129 mmol/L — ABNORMAL LOW (ref 135–145)

## 2021-10-16 LAB — PROTIME-INR
INR: 1.4 — ABNORMAL HIGH (ref 0.8–1.2)
Prothrombin Time: 16.9 seconds — ABNORMAL HIGH (ref 11.4–15.2)

## 2021-10-16 LAB — HEPATIC FUNCTION PANEL
ALT: 48 U/L — ABNORMAL HIGH (ref 0–44)
AST: 61 U/L — ABNORMAL HIGH (ref 15–41)
Albumin: 3.2 g/dL — ABNORMAL LOW (ref 3.5–5.0)
Alkaline Phosphatase: 60 U/L (ref 38–126)
Bilirubin, Direct: 0.4 mg/dL — ABNORMAL HIGH (ref 0.0–0.2)
Indirect Bilirubin: 0.9 mg/dL (ref 0.3–0.9)
Total Bilirubin: 1.3 mg/dL — ABNORMAL HIGH (ref 0.3–1.2)
Total Protein: 5.4 g/dL — ABNORMAL LOW (ref 6.5–8.1)

## 2021-10-16 MED ORDER — FUROSEMIDE 10 MG/ML IJ SOLN
80.0000 mg | Freq: Once | INTRAMUSCULAR | Status: AC
  Administered 2021-10-16: 16:00:00 80 mg via INTRAVENOUS
  Filled 2021-10-16: qty 8

## 2021-10-16 MED ORDER — BISACODYL 5 MG PO TBEC
10.0000 mg | DELAYED_RELEASE_TABLET | Freq: Every day | ORAL | Status: DC | PRN
  Administered 2021-10-16: 16:00:00 10 mg via ORAL
  Filled 2021-10-16: qty 2

## 2021-10-16 MED ORDER — FUROSEMIDE 40 MG PO TABS
40.0000 mg | ORAL_TABLET | Freq: Two times a day (BID) | ORAL | Status: DC
  Administered 2021-10-17 (×2): 40 mg via ORAL
  Filled 2021-10-16 (×2): qty 1

## 2021-10-16 MED ORDER — MAGNESIUM HYDROXIDE 400 MG/5ML PO SUSP: 30.0000 mL | Freq: Every day | ORAL | Status: DC | PRN

## 2021-10-16 MED ORDER — METOLAZONE 5 MG PO TABS
5.0000 mg | ORAL_TABLET | Freq: Once | ORAL | Status: AC
  Administered 2021-10-16: 11:00:00 5 mg via ORAL
  Filled 2021-10-16: qty 1

## 2021-10-16 MED ORDER — WARFARIN SODIUM 7.5 MG PO TABS
7.5000 mg | ORAL_TABLET | Freq: Once | ORAL | Status: AC
  Administered 2021-10-16: 16:00:00 7.5 mg via ORAL
  Filled 2021-10-16: qty 1

## 2021-10-16 NOTE — Progress Notes (Addendum)
[facility] VALVE TEAM  Patient Name: [name] [name] Date of Encounter: 10/16/2021  Primary Cardiologist: Dr. Hunt Oris, MD (HP)/ Dr. Sherren Mocha, MD (TAVR)   Hospital Problem List     Principal Problem:   S/P TAVR (transcatheter aortic valve replacement) Active Problems:   Essential hypertension   Presence of permanent cardiac pacemaker   Thoracic aortic aneurysm   Myocardial infarct [facility])   Rheumatic heart disease   Acute on chronic HFrEF (heart failure with reduced ejection fraction) (HCC)   Severe aortic stenosis   Chronic anticoagulation   Transaminitis   H/O mitral valve replacement with mechanical valve  Subjective   Feeling well today with no complaints. Having increased edema in LE today. Added metolazone. Denies chest pain, SOB.   Inpatient Medications    Scheduled Meds:  docusate sodium  100 mg Oral BID   furosemide  40 mg Oral BID   lactose free nutrition  237 mL Oral BID   levothyroxine  75 mcg Oral QAC breakfast   metolazone  5 mg Oral Once   pantoprazole  40 mg Oral Daily   potassium chloride SA  40 mEq Oral Daily   sodium chloride flush  3 mL Intravenous Q12H   Warfarin - Pharmacist Dosing Inpatient   Does not apply q1600   Continuous Infusions:  sodium chloride     sodium chloride     sodium chloride     nitroGLYCERIN     PRN Meds: sodium chloride, sodium chloride, acetaminophen **OR** acetaminophen, albuterol, ALPRAZolam, alum & mag hydroxide-simeth, baclofen, bisacodyl, morphine injection, nitroGLYCERIN, oxyCODONE, sodium chloride flush, zolpidem   Vital Signs    Vitals:   10/15/21 1932 10/15/21 2319 10/16/21 0403 10/16/21 0740  BP: (!) 124/99 (!) 120/52 129/69 122/65  Pulse: 73 86 77 76  Resp: 18 20 20 19   Temp: 98.2 F (36.8 C) 98.3 F (36.8 C) 98.4 F (36.9 C) 98.2 F (36.8 C)  TempSrc: Oral Oral Oral Oral  SpO2: 97% 92% 96% 94%  Weight:      Height:        Intake/Output  Summary (Last 24 hours) at 10/16/2021 1004 Last data filed at 10/15/2021 1724 Gross per 24 hour  Intake 240 ml  Output 800 ml  Net -560 ml   Filed Weights   10/13/21 0415 10/14/21 0342 10/15/21 0326  Weight: 53.8 kg 53.8 kg 56.2 kg    Physical Exam    General: Well developed, well nourished, NAD Neck: Negative for carotid bruits. No JVD Lungs:Clear to ausculation bilaterally. Breathing is unlabored. Cardiovascular: Irregular with S1 S2. + murmur Abdomen: Soft, non-tender, non-distended. No obvious abdominal masses. Extremities: 1-2+LE edema. Neuro: Alert and oriented. No focal deficits. No facial asymmetry. MAE spontaneously. Psych: Responds to questions appropriately with normal affect.    Labs    CBC Recent Labs    10/15/21 0222 10/16/21 0256  WBC 6.0 5.1  HGB 10.3* 9.1*  HCT 33.1* 29.1*  MCV 87.3 87.1  PLT 96* 79*   Basic Metabolic Panel Recent Labs    10/14/21 0449 10/14/21 0758 10/15/21 0222 10/16/21 0256  NA 132*   < > 132* 129*  K 4.3   < > 3.7 4.0  CL 95*   < > 92* 91*  CO2 29  --  29 29  GLUCOSE 91   < > 87 89  BUN 14   < > 12 13  CREATININE 1.02*   < > 0.97  0.93  CALCIUM 8.4*  --  8.5* 8.2*  MG 1.8  --   --   --    < > = values in this interval not displayed.   Liver Function Tests Recent Labs    10/14/21 0449 10/16/21 0256  AST 121* 61*  ALT 152* 48*  ALKPHOS 70 60  BILITOT 1.6* 1.3*  PROT 5.5* 5.4*  ALBUMIN 3.4* 3.2*   No results for input(s): LIPASE, AMYLASE in the last 72 hours. Cardiac Enzymes No results for input(s): CKTOTAL, CKMB, CKMBINDEX, TROPONINI in the last 72 hours. BNP Invalid input(s): POCBNP D-Dimer No results for input(s): DDIMER in the last 72 hours. Hemoglobin A1C No results for input(s): HGBA1C in the last 72 hours. Fasting Lipid Panel No results for input(s): CHOL, HDL, LDLCALC, TRIG, CHOLHDL, LDLDIRECT in the last 72 hours. Thyroid Function Tests No results for input(s): TSH, T4TOTAL, T3FREE, THYROIDAB in  the last 72 hours.  Invalid input(s): FREET3  Telemetry    10/16/21 Intermittent AV pacing. Irregular rhythm - Personally Reviewed  ECG    No new tracing as of 10/16/21 - Personally Reviewed  Radiology    No results found.  Cardiac Studies    Echocardiogram 10/15/21: Pending final results    TAVR OPERATIVE NOTE     Date of Procedure:                10/14/2021   Preoperative Diagnosis:      Severe Aortic Stenosis    Postoperative Diagnosis:    Same    Procedure:        Transcatheter Aortic Valve Replacement - Percutaneous Right Transfemoral Approach             Edwards Sapien 3 Ultra Resilia THV (size 23 mm, model # 9755RSL, serial # [id number])              Co-Surgeons:                        Gaye Pollack, MD and Sherren Mocha, MD     Anesthesiologist:                  Albertha Ghee, MD   Echocardiographer:              Jenkins Rouge, MD   Pre-operative Echo Findings: Severe aortic stenosis Moderate left ventricular systolic dysfunction   Post-operative Echo Findings: Trace paravalvular leak Unchanged moderate left ventricular systolic dysfunction     Echo 10/12/21:  1. Left ventricular ejection fraction, by estimation, is 30 to 35%. The  left ventricle has moderately decreased function. The left ventricle  demonstrates regional wall motion abnormalities. Abnormal (paradoxical)  septal motion, consistent with RV  pacemaker.      The left ventricular internal cavity size was mildly dilated. There is  severe asymmetric left ventricular hypertrophy of the basal-septal  segment. Left ventricular diastolic parameters are indeterminate.   2. Right ventricular systolic function is mildly reduced. The right  ventricular size is normal. There is mildly elevated pulmonary artery  systolic pressure. The estimated right ventricular systolic pressure is  [id number] mmHg.   3. Left atrial size was severely dilated.   4. Right atrial size was mildly dilated.   5. There is  a mechanical valve present in the mitral position      No evidence of mitral valve regurgitation. The mean mitral valve  gradient is 3.0 mmHg at 68bpm.   6. Tricuspid valve regurgitation is moderate.  7. The inferior vena cava is normal in size with <50% respiratory  variability, suggesting right atrial pressure of 8 mmHg.   8. The aortic valve is calcified. There is severe calcifcation of the  aortic valve. Aortic valve regurgitation is moderate. Severe aortic  stenosis. Vmax 4.46m/s, MG 53 mmHg, AVA 0.4 cm^2, DI 0.15    Patient Profile     75 y.o. 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   Assessment & Plan    Severe AS: s/p successful TAVR with a 23 mm Edwards Sapien 3 Ultra Resilia via the TF approach on 10/14/21. Post operative echo pending today. Groin sites are stable. ECG with AV paced rhythm, irregular but stable rates. Patient restarted on coumadin post procedure. INR today at 1.4.    Acute on chronic combined systolic and diastolic CHF: Initial plan was to direct admit the patient for Coumadin bridge prior to TAVR on 10/14/21 however on arrival found to be significantly volume overloaded. Repeat echo 12/25 showed EF 30 to 35%, mild RV dysfunction, mechanical mitral valve with no MR and mean gradient 3 mmHg, very severe aortic stenosis (Vmax 4.42m/s, MG 53 mmHg, AVA 0.4 cm^2, DI 0.15). She was started on IV Lasix 40mg  BID. Weight up from baseline at 123.9lb from yesterday>>no updated weight today. I&O, net neg 3.5L. Transitioned to PO Lasix yesterday however continues to have moderate volume in LE therefore will add one time dose of metolazone and follow response. May need home QW dosing at d/c. Obtain CXR today.    Mechanical mitral valve: Started on  heparin drip in preparation for TAVR however restarted Coumadin yesterday evening with INR 1.4 today. Follows her own INR levels at home with PCP making dosing adjustments.   Chronic anticoagulation: On PTA Coumadin as above due to mechanical MV.     Paroxysmal atrial fibrillation: Recently discharged on digoxin for PAF with levels undetectable at <0.2 on admission 10/11/21. Dig currently being held.    Transaminitis: Suspect due to hepatic congestion in setting of decompensated heart failure as above. Right upper quadrant ultrasound showed hepatic steatosis with slightly nodular border inferiorly that may be associated with underlying cirrhosis. PLast AST/ALT 121/152. Improved on today's labs at 61/48.    Hyponatremia: Appears chronic with no mental status changes. Likely secondary to diuresis.   Incidental findings: Dilated ascending aorta measuring 4.4 cm. Will continue with routine surveillance.     Signed12/26/22, NP  10/16/2021, 10:04 AM  Pager [phone]   Patient seen, examined. Available data reviewed. Agree with findings, assessment, and plan as outlined by [phone], NP.  The patient is independently interviewed and examined.  She is alert, oriented, in no distress.  HEENT is normal, JVP is moderately elevated, lungs are diminished in the bases but otherwise clear, heart is regular rate and rhythm with a 2/6 systolic murmur at the right upper sternal border and a normal mechanical S1, abdomen is soft and nontender, extremities have 2+ ankle edema bilaterally.  The patient has continued evidence of volume overload.  She received metolazone earlier today, we will give her an additional 80 mg of IV Lasix today to try to mobilize more fluid.  We will repeat her labs in the morning and hopefully she will be ready for hospital discharge tomorrow.  We will repeat  a metabolic panel to follow her sodium level.  Otherwise her INR is trending upward and she remained stable.  She requests  more medication for pain and constipation and orders will be written for this.   Sherren Mocha, M.D. 10/16/2021 2:56 PM

## 2021-10-16 NOTE — Anesthesia Postprocedure Evaluation (Signed)
Anesthesia Post Note  Patient: [name] [name]  Procedure(s) Performed: TRANSCATHETER AORTIC VALVE REPLACEMENT, TRANSFEMORAL (Chest) INTRAOPERATIVE TRANSTHORACIC ECHOCARDIOGRAM     Patient location during evaluation: Cath Lab Anesthesia Type: MAC Level of consciousness: awake and alert Pain management: pain level controlled Vital Signs Assessment: post-procedure vital signs reviewed and stable Respiratory status: spontaneous breathing, nonlabored ventilation, respiratory function stable and patient connected to nasal cannula oxygen Cardiovascular status: stable and blood pressure returned to baseline Postop Assessment: no apparent nausea or vomiting Anesthetic complications: no   No notable events documented.  Last Vitals:  Vitals:   10/15/21 2319 10/16/21 0403  BP: (!) 120/52 129/69  Pulse: 86 77  Resp: 20 20  Temp: 36.8 C 36.9 C  SpO2: 92% 96%    Last Pain:  Vitals:   10/16/21 0414  TempSrc:   PainSc: [place]

## 2021-10-16 NOTE — Progress Notes (Signed)
CARDIAC REHAB PHASE I   PRE:  Rate/Rhythm: 72 pacing    BP: sitting 158/85    SaO2: 96 1L  MODE:  Ambulation: 320 ft   POST:  Rate/Rhythm: 95 pacing    BP: sitting 172/91     SaO2: 85-90 RA walking, 93 RA at rest  Pt moving well with RW, slow pace. C/o back pain. SAO2 difficult to read while walking but low at times. Up to 93 RA after walk talking on phone. Left on RA.  [phone]   Harriet Masson CES, ACSM 10/16/2021 11:14 AM

## 2021-10-16 NOTE — Progress Notes (Signed)
ANTICOAGULATION CONSULT NOTE  Pharmacy Consult for warfarin Indication: atrial fibrillation/MVR  Allergies  Allergen Reactions   Atenolol Other (See Comments)    HR and pulse dropped   Metoprolol Succinate [Metoprolol] Other (See Comments)    HR and pulse dropped   Baclofen Anxiety and Other (See Comments)    Makes the patient feel anxious the day after taking this   Latex Rash    Patient Measurements: Height: 4\' 10"  (147.3 cm) Weight: 56.2 kg (123 lb 14.4 oz) IBW/kg (Calculated) : 40.9 Heparin Dosing Weight: 52.4 kg  Vital Signs: Temp: 97.9 F (36.6 C) (12/29 1049) Temp Source: Oral (12/29 1049) BP: 172/91 (12/29 1103) Pulse Rate: 73 (12/29 1103)  Labs: Recent Labs    10/13/21 1609 10/14/21 0449 10/14/21 0758 10/14/21 1025 10/15/21 0222 10/16/21 0256  HGB  --  10.0*   < > 10.5* 10.3* 9.1*  HCT  --  31.3*   < > 31.0* 33.1* 29.1*  PLT  --  128*  --   --  96* 79*  LABPROT 15.8*  --   --   --  15.8* 16.9*  INR 1.3*  --   --   --  1.3* 1.4*  HEPARINUNFRC  --  0.60  --   --   --   --   CREATININE  --  1.02*   < > 0.90 0.97 0.93   < > = values in this interval not displayed.     Estimated Creatinine Clearance: 39.4 mL/min (by C-G formula based on SCr of 0.93 mg/dL).   Medical History: Past Medical History:  Diagnosis Date   Anxiety    Aortic aneurysm [facility])    Atrial arrhythmia    With prior history of atrial flutter ablation   Bradycardia    s/p pacemaker implantation   CONGESTIVE HEART FAILURE 07/31/2010   Qualifier: Diagnosis of  By: 08/02/2010, MD, Graciela Husbands    GERD (gastroesophageal reflux disease)    Hypertension    Hypothyroidism    Long Q-T syndrome    Myocardial infarct (HCC) 11/20/2011   Stent placement for 100% blockage of LAD   PACEMAKER, PERMANENT 2009   Medtronic Adapta serial # [id number]   Rheumatic heart disease    With mitral valve replacement, tricuspid regurgitation moderate to severe, significant left atrial enlargment   S/P  TAVR (transcatheter aortic valve replacement) 10/14/2021   Edwards 6mm S3U via TF approach with Dr. 37m and Dr. Excell Seltzer   TRICUSPID REGURGITATION 12/23/2010   Qualifier: Diagnosis of  By: 02/22/2011, MD, Graciela Husbands    Unspecified essential hypertension 07/31/2010   Qualifier: Diagnosis of  By: 08/02/2010, MD, Graciela Husbands    Assessment: 75 yo female admitted for TAVR. She was previously anticoagulated on warfarin for hx mMVR which was held preadmit (pt on enoxaparin bridge pre-op outpatient). Heparin started while admited.  Pt s/p TAVR 12/27. Pharmacy asked to resume warfarin 12/27 pm. No heparin immediately postop for now. INR 1.4 today. Prior PTA dose was 7.5mg  daily.   Goal of Therapy:  INR 2.5-3.5 Monitor platelets by anticoagulation protocol: Yes   Plan:  Warfarin 7.5mg  tonight Daily INR   1/28, Pharm.D., BCPS Clinical Pharmacist  **Pharmacist phone directory can be found on [email or web address] listed under [facility].  10/16/2021 3:54 PM

## 2021-10-17 ENCOUNTER — Encounter (HOSPITAL_COMMUNITY): Payer: Self-pay | Admitting: Cardiology

## 2021-10-17 DIAGNOSIS — E876 Hypokalemia: Secondary | ICD-10-CM

## 2021-10-17 HISTORY — DX: Hypokalemia: E87.6

## 2021-10-17 LAB — CBC
HCT: 30.7 % — ABNORMAL LOW (ref 36.0–46.0)
Hemoglobin: 9.9 g/dL — ABNORMAL LOW (ref 12.0–15.0)
MCH: 28 pg (ref 26.0–34.0)
MCHC: 32.2 g/dL (ref 30.0–36.0)
MCV: 86.7 fL (ref 80.0–100.0)
Platelets: 85 10*3/uL — ABNORMAL LOW (ref 150–400)
RBC: 3.54 MIL/uL — ABNORMAL LOW (ref 3.87–5.11)
RDW: 16.4 % — ABNORMAL HIGH (ref 11.5–15.5)
WBC: 5.7 10*3/uL (ref 4.0–10.5)
nRBC: 0 % (ref 0.0–0.2)

## 2021-10-17 LAB — BASIC METABOLIC PANEL
Anion gap: 8 (ref 5–15)
BUN: 12 mg/dL (ref 8–23)
CO2: 32 mmol/L (ref 22–32)
Calcium: 8.4 mg/dL — ABNORMAL LOW (ref 8.9–10.3)
Chloride: 90 mmol/L — ABNORMAL LOW (ref 98–111)
Creatinine, Ser: 0.76 mg/dL (ref 0.44–1.00)
GFR, Estimated: >60 mL/min (ref >60)
Glucose, Bld: 85 mg/dL (ref 70–99)
Potassium: 3 mmol/L — ABNORMAL LOW (ref 3.5–5.1)
Sodium: 130 mmol/L — ABNORMAL LOW (ref 135–145)

## 2021-10-17 LAB — ECHOCARDIOGRAM COMPLETE
AR max vel: 2.78 cm2
AV Area VTI: 2.96 cm2
AV Area mean vel: 2.59 cm2
AV Mean grad: 10.6 mmHg
AV Peak grad: 20.9 mmHg
Ao pk vel: 2.28 m/s
Area-P 1/2: 3.37 cm2
Calc EF: 33.3 %
Height: 58 in
MV VTI: 3.07 cm2
S' Lateral: 3.8 cm
Single Plane A2C EF: 44.3 %
Single Plane A4C EF: 29.4 %
Weight: 1982.4 oz

## 2021-10-17 LAB — PROTIME-INR
INR: 1.4 — ABNORMAL HIGH (ref 0.8–1.2)
Prothrombin Time: 16.8 seconds — ABNORMAL HIGH (ref 11.4–15.2)

## 2021-10-17 MED ORDER — WARFARIN SODIUM 7.5 MG PO TABS
7.5000 mg | ORAL_TABLET | Freq: Once | ORAL | Status: AC
  Administered 2021-10-17: 16:00:00 7.5 mg via ORAL
  Filled 2021-10-17: qty 1

## 2021-10-17 MED ORDER — ENOXAPARIN SODIUM 60 MG/0.6ML IJ SOSY
60.0000 mg | PREFILLED_SYRINGE | Freq: Two times a day (BID) | INTRAMUSCULAR | Status: DC
  Administered 2021-10-17: 12:00:00 60 mg via SUBCUTANEOUS
  Filled 2021-10-17: qty 0.6

## 2021-10-17 MED ORDER — POTASSIUM CHLORIDE CRYS ER 20 MEQ PO TBCR
40.0000 meq | EXTENDED_RELEASE_TABLET | Freq: Once | ORAL | Status: AC
  Administered 2021-10-17: 12:00:00 40 meq via ORAL
  Filled 2021-10-17: qty 2

## 2021-10-17 MED ORDER — ENOXAPARIN SODIUM 60 MG/0.6ML IJ SOSY: 60.0000 mg | PREFILLED_SYRINGE | Freq: Two times a day (BID) | INTRAMUSCULAR | Status: DC

## 2021-10-17 MED ORDER — WARFARIN SODIUM 7.5 MG PO TABS: 7.5000 mg | ORAL_TABLET | Freq: Every day | ORAL | Status: DC

## 2021-10-17 NOTE — TOC Transition Note (Signed)
Transition of Care (TOC) - CM/SW Discharge Note Donn Pierini RN, BSN Transitions of Care Unit 4E- RN Case Manager See Treatment Team for direct phone #    Patient Details  Name: [name] [name] MRN: [id number] Date of Birth: 14-Oct-1946  Transition of Care [facility]) CM/SW Contact:  Darrold Span, RN Phone Number: 10/17/2021, 12:17 PM   Clinical Narrative:    Pt stable for transition home today s/p TAVR. Pt has home 02 at baseline with [employer]. Requesting rollator for home- order has been placed and [employer] in house provider called for DME need. Rollator to be delivered to room prior to discharge.  Daughter to assist post discharge, no further TOC needs noted.    Final next level of care: Home/Self Care Barriers to Discharge: No Barriers Identified   Patient Goals and CMS Choice Patient states their goals for this hospitalization and ongoing recovery are:: return home CMS Medicare.gov Compare Post Acute Care list provided to:: Patient Choice offered to / list presented to : Patient  Discharge Placement               Home         Discharge Plan and Services   Discharge Planning Services: CM Consult Post Acute Care Choice: Durable Medical Equipment          DME Arranged: Walker rolling with seat DME Agency: [employer] Date DME Agency Contacted: 10/17/21 Time DME Agency Contacted: 1100 Representative spoke with at DME Agency: Velna Hatchet HH Arranged: NA HH Agency: NA        Social Determinants of Health (SDOH) Interventions     Readmission Risk Interventions Readmission Risk Prevention Plan 10/17/2021  Transportation Screening Complete  PCP or Specialist Appt within 3-5 Days Complete  Social Work Consult for Recovery Care Planning/Counseling Complete  Palliative Care Screening Not Applicable  Medication Review Oceanographer) Complete  Some recent data might be hidden

## 2021-10-17 NOTE — Progress Notes (Signed)
°  [facility]   MULTIDISCIPLINARY HEART VALVE TEAM   Personally called the patients daughter [name]) several times with no answer. I have left a detailed message regarding the patients discharge and need for a ride home. Initial plan was for the patients daughter to come pick her up early afternoon, take her to [facility] to pick up her medications and take the patient to her home. Patient reports that she will not take a cab home at this time. Due to this, we will need to keep her overnight and arrange transportation in the morning. I will send a message to the weekend team for assistance with transportation.    Georgie Chard NP-C Structural Heart Team  Pager: [phone] Phone: [phone]

## 2021-10-17 NOTE — Progress Notes (Signed)
Mobility Specialist: Progress Note   10/17/21 1144  Mobility  Activity Ambulated in hall  Level of Assistance Modified independent, requires aide device or extra time  Assistive Device Front wheel walker  Distance Ambulated (ft) 450 ft  Mobility Ambulated with assistance in hallway  Mobility Response Tolerated well  Mobility performed by Mobility specialist  $Mobility charge 1 Mobility   Pre-Mobility: 85 HR, 100% SpO2 During Mobility: 116 HR Post-Mobility: 96 HR, 94% SpO2  Pt mod I with no c/o throughout ambulation. Pt ambulated on RA today with sats maintaining >90%. Pt to BR after walk, void successful. Pt's HR peaked at 145 bpm after returning to the room, quickly resolved after getting back in the bed. Pt has call bell and phone in reach.   [facility] [place] [place] Imanuel Pruiett Mobility Specialist Mobility Specialist [address] [place]: [phone] Mobility Specialist [address] [place] [address] [place]: [phone]

## 2021-10-17 NOTE — Discharge Summary (Addendum)
[facility]   MULTIDISCIPLINARY HEART VALVE TEAM  Discharge Summary    Patient ID: [name] [name] MRN: [id number]; DOB: January 28, 1946  Admit date: 10/11/2021 Discharge date: 10/17/2021  Primary Care Provider: Marylen Ponto, MD  Primary Cardiologist: [name] Bollman, MD   Discharge Diagnoses    Principal Problem:   S/P TAVR (transcatheter aortic valve replacement) Active Problems:   Essential hypertension   Presence of permanent cardiac pacemaker   Thoracic aortic aneurysm   Myocardial infarct [facility])   Rheumatic heart disease   Acute on chronic HFrEF (heart failure with reduced ejection fraction) (HCC)   Severe aortic stenosis   Chronic anticoagulation   Transaminitis   H/O mitral valve replacement with mechanical valve   Hypokalemia  Allergies Allergies  Allergen Reactions   Atenolol Other (See Comments)    HR and pulse dropped   Metoprolol Succinate [Metoprolol] Other (See Comments)    HR and pulse dropped   Baclofen Anxiety and Other (See Comments)    Makes the patient feel anxious the day after taking this   Latex Rash   Diagnostic Studies/Procedures     TAVR OPERATIVE NOTE     Date of Procedure:                10/14/2021   Preoperative Diagnosis:      Severe Aortic Stenosis    Postoperative Diagnosis:    Same    Procedure:        Transcatheter Aortic Valve Replacement - Percutaneous Right Transfemoral Approach             Edwards Sapien 3 Ultra Resilia THV (size 23 mm, model # 9755RSL, serial # [id number])              Co-Surgeons:                        [name] Borne, MD and [name] Bollman, MD     Anesthesiologist:                  [name] Rich, MD   Echocardiographer:              [name] Haws, MD   Pre-operative Echo Findings: Severe aortic stenosis Moderate left ventricular systolic dysfunction   Post-operative Echo Findings: Trace paravalvular leak Unchanged moderate left ventricular systolic dysfunction _____________    Echo 10/15/21: Completed but pending formal read at the time of discharge (spoke with TAVR imager who will read later today, 10/17/21)  History of Present Illness     [name] [name] is a 75 y.o. female with a history of rheumatic MV/AV disease s/p mechanical MVR (on warfarin), moderate AR, mod-severe TR, PPM, hypothyroidism, aortic aneurysm, GERD and critical aortic stenosis who presented to [facility] on 10/11/21 for direct admission for Heparin bridge prior to TAVR on 10/14/21.   Ms. [name] [name] was initially referred to [name]. Excell [name] for the evaluation of critical aortic stenosis by [name]. Reuel [name]. She had rheumatic fever at age 61 and went on to develop rheumatic heart disease. She ultimately underwent mechanical mitral valve replacement approximately 20 years ago at [facility]. She has tolerated long-term warfarin therapy well without any recent bleeding problems. She underwent permanent pacemaker placement (dual-chamber) in 2009 by [name] [name]. On initial evaluation, she had reported worsening shortness of breath and fatigue with low-level activity ober the previous year. She had no orthopnea, PND, or leg swelling. She could only walk short distances without feeling breathless. Recent echocardiogram  07/22/21 demonstrated severe global LV systolic dysfunction with LVEF 30 to 35%, severe calcification and restriction of the aortic valve leaflets, mild to moderate aortic valve insufficiency, and critical aortic stenosis with a mean transvalvular gradient of 53 mmHg, peak systolic velocity of 4.5 m/s, and calculated aortic valve area less than 0.5 cm.  She actually underwent TAVR evaluation at [facility], however did not follow through with completing her work-up there. She had CT angiography studies done earlier this year however plan was to update these along with [facility]. Cardiac catheterization performed 10/03/21 which showed patent coronary arteries with diffuse nonobstructive CAD, continued patency of the mid LAD stent,  known severe aortic stenosis with heavy calcification and restriction of the aortic valve leaflets, normal fluoroscopic motion of the patient's mechanical mitral prosthesis, moderate pulmonary hypertension with mean PA pressure 36 mmHg, transpulmonary gradient 13 mmHg, PVR 3.8. Plan was to coordinate hospital admission for planned TAVR 10/14/21. She unfortunately was admitted 08/27/21 for CHF and was diuresed with IV Lasix and was discharged home 10/06/21 with anticipated direct admission 10/11/21.   She was evaluated by the multidisciplinary valve team, including CT surgery and felt to have severe, symptomatic aortic stenosis and to be a suitable candidate for TAVR, which was set up for 10/14/21.     Hospital Course     Severe AS: s/p successful TAVR with a 23 mm Edwards Sapien 3 Ultra Resilia via the TF approach on 10/14/21. Post operative echo pending. Spoke with TAVR imager who will be reading today. Groin sites have remained stable with no evidence of bleeding or hematoma. ECG with AV paced rhythm, irregular but stable rates. Patient restarted on coumadin post procedure with persistently sub-therapeutic INR levels. She checks her own INR levels at home and has a large supply of SQ Lovenox. Plan is to dose with full dose Lovenox 60mg  today while still here in the hospital. She will take another tonight and continue BID dosing through Monday. She will resume PTA Coumadin 7.5mg . Recheck INR at home on Monday and call PCP, [name]. Greggory [name] office when they re-open Tuesday after the holiday for further dosing instructions. Discussed plan with [facility] pharmacy team.    Acute on chronic combined systolic and diastolic CHF: Initial plan was to direct admit the patient for Coumadin bridge prior to TAVR on 10/14/21 however on arrival found to be significantly volume overloaded. Repeat echo 12/25 showed EF 30 to 35%, mild RV dysfunction, mechanical mitral valve with no MR and mean gradient 3 mmHg, very severe aortic stenosis  (Vmax 4.29m/s, MG 53 mmHg, AVA 0.4 cm^2, DI 0.15). She was started on IV Lasix 40mg  BID with adequate response. On transition to PO dosing, she had residual fluid overload and was  therefore given one dose of metolazone and an additional dose of IV Lasix. Obtain CXR with pulmonary edema. On evaluation today, she appears more stable and plan will be to resume home PO Lasix at 40mg  BID. May need further adjustments at follow up next week. Weight 118lb, I&O, net neg 5.9L.     Mechanical mitral valve: Started on heparin infusion in preparation for TAVR however restarted Coumadin in the post procedure setting. Continues to have sub-therapeutic INR levels. See plan as described above in #1.    Chronic anticoagulation: On PTA Coumadin as above due to mechanical MV.     Paroxysmal atrial fibrillation: Mostly AV paced with underlying irregular rhythm. No BB due to intolerance per patient report. Restarted home digoxin. Continue Coumadin as above  Transaminitis: Suspected due to hepatic congestion in setting of decompensated heart failure as above. Right upper quadrant ultrasound showed hepatic steatosis with slightly nodular border inferiorly that may be associated with underlying cirrhosis. Last AST/ALT 121/152 with continued improvement with diuresis. Last AST/ALT at 61/48.    Hyponatremia: Appears chronic with no mental status changes. Likely secondary to diuresis.  Hypokalemia: K+ 3.0 today with diuresis. Replaced prior to discharge. Will obtain BMET at follow up in one week.    Incidental findings: Dilated ascending aorta measuring 4.4 cm. Will continue with routine surveillance.   Consultants: None    The patient was seen and examined by [name]. Burt Knack who feels that she is stable and ready for discharge today, 10/17/21.  _____________  Discharge Vitals Blood pressure (!) 166/88, pulse 90, temperature 98.2 F (36.8 C), temperature source Oral, resp. rate 20, height 4\' 10"  (1.473 m), weight 53.5  kg, SpO2 94 %.  Filed Weights   10/14/21 0342 10/15/21 0326 10/17/21 0327  Weight: 53.8 kg 56.2 kg 53.5 kg   General: Well developed, well nourished, NAD Lungs:Clear to ausculation bilaterally. No wheezes, rales, or rhonchi. Breathing is unlabored. Cardiovascular: Irregular with S1 S2. + murmur Abdomen: Soft, non-tender, non-distended. No obvious abdominal masses. Extremities: 1-2 + BLE edema. Neuro: Alert and oriented. No focal deficits. No facial asymmetry. MAE spontaneously. Psych: Responds to questions appropriately with normal affect.    Labs & Radiologic Studies    CBC Recent Labs    10/16/21 0256 10/17/21 0137  WBC 5.1 5.7  HGB 9.1* 9.9*  HCT 29.1* 30.7*  MCV 87.1 86.7  PLT 79* 85*   Basic Metabolic Panel Recent Labs    10/16/21 0256 10/17/21 0137  NA 129* 130*  K 4.0 3.0*  CL 91* 90*  CO2 29 32  GLUCOSE 89 85  BUN 13 12  CREATININE 0.93 0.76  CALCIUM 8.2* 8.4*   Liver Function Tests Recent Labs    10/16/21 0256  AST 61*  ALT 48*  ALKPHOS 60  BILITOT 1.3*  PROT 5.4*  ALBUMIN 3.2*   No results for input(s): LIPASE, AMYLASE in the last 72 hours. Cardiac Enzymes No results for input(s): CKTOTAL, CKMB, CKMBINDEX, TROPONINI in the last 72 hours. BNP Invalid input(s): POCBNP D-Dimer No results for input(s): DDIMER in the last 72 hours. Hemoglobin A1C No results for input(s): HGBA1C in the last 72 hours. Fasting Lipid Panel No results for input(s): CHOL, HDL, LDLCALC, TRIG, CHOLHDL, LDLDIRECT in the last 72 hours. Thyroid Function Tests No results for input(s): TSH, T4TOTAL, T3FREE, THYROIDAB in the last 72 hours.  Invalid input(s): FREET3 _____________  DG Chest 2 View  Result Date: 10/11/2021 CLINICAL DATA:  Congestive heart failure EXAM: CHEST - 2 VIEW COMPARISON:  09/30/2021 FINDINGS: LEFT-sided pacemaker overlies normal cardiac silhouette. Patient rotated leftward. Sternotomy wires noted. LEFT pacer noted. Bilateral pleural effusions present.  No pulmonary edema. No infiltrate. IMPRESSION: Cardiomegaly and bilateral pleural effusions Electronically Signed   By: Suzy Bouchard M.D.   On: 10/11/2021 16:35   DG Chest 2 View  Result Date: 09/30/2021 CLINICAL DATA:  Shortness of breath EXAM: CHEST - 2 VIEW COMPARISON:  08/26/2021 FINDINGS: Cardiomegaly status post median sternotomy with mitral annulus prosthesis. Left chest multi lead pacer. Small, layering bilateral pleural effusions and associated atelectasis or consolidation. Exaggerated thoracic kyphosis. Disc degenerative disease of the thoracic spine. IMPRESSION: 1.  Cardiomegaly. 2. Small, layering bilateral pleural effusions and associated atelectasis or consolidation. Electronically Signed   By: Jamse Mead.D.  On: 09/30/2021 11:27   DG Abdomen 1 View  Result Date: 09/30/2021 CLINICAL DATA:  Shortness of breath, LEFT lower quadrant abdominal pain, unable to urinate since yesterday, constipation EXAM: ABDOMEN - 1 VIEW COMPARISON:  02/27/2014 FINDINGS: Nonobstructive bowel gas pattern. No bowel dilatation or bowel wall thickening. Bones demineralized with degenerative facet disease changes lower lumbar spine. Surgical clips RIGHT upper quadrant. No urinary tract calcifications. IMPRESSION: No acute abnormalities. Electronically Signed   By: Lavonia Dana M.D.   On: 09/30/2021 11:29   CARDIAC CATHETERIZATION  Result Date: 10/03/2021   Mid LAD lesion is 20% stenosed.   1st Mrg lesion is 40% stenosed.   Prox RCA lesion is 30% stenosed.   Hemodynamic findings consistent with moderate pulmonary hypertension.   There is severe aortic valve stenosis. 1.  Patent coronary arteries with diffuse nonobstructive CAD, continued patency of the mid LAD stent 2.  Known severe aortic stenosis with heavy calcification and restriction of the aortic valve leaflets 3.  Normal fluoroscopic motion of the patient's mechanical mitral prosthesis 4.  Moderate pulmonary hypertension with mean PA pressure 36  mmHg, transpulmonary gradient 13 mmHg, PVR 3.8 Wood units Recommend: Resume warfarin today, start Lovenox tomorrow morning for bridging, tentatively plan TAVR 12/27 we will coordinate hospital admission for bridging into the procedure.  Medical therapy for nonobstructive CAD.  Continue IV diuresis today with elevated filling pressures.   CT CORONARY MORPH W/CTA COR W/SCORE W/CA W/CM &/OR WO/CM  Addendum Date: 10/02/2021   ADDENDUM REPORT: 10/02/2021 09:41 CLINICAL DATA:  Aortic stenosis EXAM: Cardiac TAVR CT TECHNIQUE: The patient was scanned on a Siemens Force [id number] slice scanner. A 120 kV retrospective scan was triggered in the descending thoracic aorta at 111 HU's. Gantry rotation speed was 270 msecs and collimation was .9 mm. No beta blockade or nitro were given. The 3D data set was reconstructed in 5% intervals of the R-R cycle. Systolic and diastolic phases were analyzed on a dedicated work station using MPR, MIP and VRT modes. The patient received 80 cc of contrast. FINDINGS: Aortic Valve: Tri leaflet calcified with restricted leaflet motion Calcium score > 5000 Aorta: Heavily calcified mildly dilated ascending thoracic aorta severe calcific atherosclerosis Sinotubular Junction: 27 mm Ascending Thoracic Aorta: 39 mm Descending Thoracic Aorta: 24 mm Sinus of Valsalva Measurements: Non-coronary: 34.8 mm Right - coronary: 33.6 mm Left - coronary: 32.4 mm Coronary Artery Height above Annulus: Left Main: 11.7 mm above annulus Right Coronary: 11.1 mm above annulus Virtual Basal Annulus Measurements: Maximum/Minimum Diameter: 27 mm x 19.4 mm Perimeter: 77.6 mm Area: 441 mm2 30% phase Coronary Arteries: Sufficient height above annulus for deployment Optimum Fluoroscopic Angle for Delivery: LAO 12 Caudal 12 degrees IMPRESSION: 1. Calcified tri-leaflet AV with score >5,000 2.  Annular area of 441 mm2 suitable for a 26 mm Sapien 3 valve 3. Normal appearing bileaflet mechanical MVR with no pannus and good opening  /closing angles 4.  Mixing artifact in LAA no delayed images done 5.  Coronary arteries sufficient height above annulus for deployment 6. Optimum angiographic angle for deployment LAO 12 Caudal 12 degrees 7.  Patient appearing stent in LAD 8.  Pacing wires in RA/RV 9.  Dilated main PA 4.1 cm suggesting elevated PA systolic pressures [place] Rouge Electronically Signed   By: Jenkins Rouge M.D.   On: 10/02/2021 09:41   Result Date: 10/02/2021 EXAM: OVER-READ INTERPRETATION  CT CHEST The following report is an over-read performed by radiologist [name]. Vinnie Langton of [facility], [place] on 10/02/2021.  This over-read does not include interpretation of cardiac or coronary anatomy or pathology. The coronary calcium score/coronary CTA interpretation by the cardiologist is attached. COMPARISON:  Chest CT 07/22/2021. FINDINGS: Extracardiac findings will be described separately under dictation for contemporaneously obtained CTA chest, abdomen and pelvis. IMPRESSION: Please see separate dictation for contemporaneously obtained CTA chest, abdomen and pelvis dated 10/02/2021 for full description of relevant extracardiac findings. Electronically Signed: By: Vinnie Langton M.D. On: 10/02/2021 09:14   DG CHEST PORT 1 VIEW  Result Date: 10/16/2021 CLINICAL DATA:  Bilateral leg swelling post TAVR procedure 2 days ago. EXAM: PORTABLE CHEST 1 VIEW COMPARISON:  Radiographs 10/11/2021 and 09/30/2021.  CT 10/02/2021. FINDINGS: 1012 hours. There is stable cardiac enlargement status post interval TAVR. Left subclavian pacemaker leads and mitral valve prosthesis appear unchanged. There is aortic atherosclerosis. Left pleural effusion and left basilar pulmonary opacity appears slightly worse. There is a stable small right pleural effusion. No evidence of pneumothorax. The bones appear unchanged. IMPRESSION: Slight enlargement of left pleural effusion with associated left basilar atelectasis. No other significant changes status  post interval TAVR. Electronically Signed   By: Richardean Sale M.D.   On: 10/16/2021 12:22   CT ANGIO CHEST AORTA W/CM & OR WO/CM  Result Date: 10/02/2021 CLINICAL DATA:  75 year old female with history of severe aortic stenosis. Preprocedural study prior to potential transcatheter aortic valve replacement (TAVR) procedure. EXAM: CT ANGIOGRAPHY CHEST, ABDOMEN AND PELVIS TECHNIQUE: Multidetector CT imaging through the chest, abdomen and pelvis was performed using the standard protocol during bolus administration of intravenous contrast. Multiplanar reconstructed images and MIPs were obtained and reviewed to evaluate the vascular anatomy. CONTRAST:  128mL OMNIPAQUE IOHEXOL 350 MG/ML SOLN COMPARISON:  None. FINDINGS: CTA CHEST FINDINGS Cardiovascular: Heart size is severely enlarged. There is no significant pericardial fluid, thickening or pericardial calcification. There is aortic atherosclerosis, as well as atherosclerosis of the great vessels of the mediastinum and the coronary arteries, including calcified atherosclerotic plaque in the left main, left anterior descending, left circumflex and right coronary arteries. Myocardial thinning and subendocardial hypoattenuation throughout the mid to distal LAD territory, likely sequela of prior LAD territory myocardial infarction(s). This is associated with some mild aneurysmal dilatation of the left ventricular apex. Severe dilatation of the pulmonic trunk (4.3 cm in diameter), concerning for pulmonary arterial hypertension. Severe thickening calcification of the aortic valve. 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 Calcifications of the mitral annulus. Left-sided pacemaker device in place with lead tips terminating in the right atrial appendage and right ventricular apex. Mediastinum/Lymph Nodes: No pathologically enlarged mediastinal or hilar lymph nodes. Hilar esophagus is unremarkable in appearance. No axillary lymphadenopathy.  Lungs/Pleura: Small bilateral pleural effusions (right greater than left) lying dependently. Scattered areas of subsegmental atelectasis are noted in the lower lungs, along with near complete atelectasis of the right middle lobe. There is a background of mild diffuse ground-glass attenuation and interlobular septal thickening throughout the lungs bilaterally, suggesting the presence of mild interstitial pulmonary edema. Musculoskeletal/Soft Tissues: Median sternotomy wires. There are no aggressive appearing lytic or blastic lesions noted in the visualized portions of the skeleton. CTA ABDOMEN AND PELVIS FINDINGS Hepatobiliary: Heterogeneous attenuation throughout the hepatic parenchyma, with large areas of low attenuation most evident in the right lobe of the liver, favored to reflect a background of heterogeneous hepatic steatosis. No definite focal cystic or solid hepatic lesions. No intra or extrahepatic biliary ductal dilatation. Status post cholecystectomy. Pancreas: No pancreatic mass. No pancreatic ductal dilatation. No pancreatic or  peripancreatic fluid collections or inflammatory changes. Spleen: Small splenule inferior to the spleen. Otherwise, unremarkable. Adrenals/Urinary Tract: Mild multifocal cortical thinning in both kidneys. Subcentimeter low-attenuation lesion in the upper pole of the left kidney, too small to characterize, but statistically likely to represent a tiny cyst. Bilateral adrenal glands are normal in appearance. No hydroureteronephrosis. Urinary bladder is normal in appearance. Stomach/Bowel: The appearance of the stomach is normal. There is no pathologic dilatation of small bowel or colon. The appendix is not confidently identified and may be surgically absent. Regardless, there are no inflammatory changes noted adjacent to the cecum to suggest the presence of an acute appendicitis at this time. Vascular/Lymphatic: Aortic atherosclerosis, with vascular findings and measurements  pertinent to potential TAVR procedure, as detailed below. No aneurysm or dissection noted in the abdominal or pelvic vasculature. No lymphadenopathy identified in the abdomen or pelvis. Reproductive: Uterus and ovaries are atrophic. Other: No significant volume of ascites.  No pneumoperitoneum. Musculoskeletal: There are no aggressive appearing lytic or blastic lesions noted in the visualized portions of the skeleton. VASCULAR MEASUREMENTS PERTINENT TO TAVR: AORTA: Minimal Aortic Diameter-13 x 14 mm Severity of Aortic Calcification-moderate to severe RIGHT PELVIS: Right Common Iliac Artery - Minimal Diameter-7.7 x 7.1 mm Tortuosity-mild Calcification-moderate Right External Iliac Artery - Minimal Diameter-7.1 x 7.1 mm Tortuosity-mild-to-moderate Calcification-none Right Common Femoral Artery - Minimal Diameter-6.5 x 6.3 mm Tortuosity-mild Calcification-none LEFT PELVIS: Left Common Iliac Artery - Minimal Diameter-8.2 x 8.7 mm Tortuosity-mild Calcification-moderate Left External Iliac Artery - Minimal Diameter-6.3 x 7.2 mm Tortuosity-mild Calcification-none Left Common Femoral Artery - Minimal Diameter-6.8 x 6.8 mm Tortuosity-mild Calcification-none Review of the MIP images confirms the above findings. IMPRESSION: 1. Vascular findings and measurements pertinent to potential TAVR procedure, as detailed above. 2. Severe thickening calcification of the aortic valve, compatible with reported clinical history of severe aortic stenosis. 3. Cardiomegaly with findings suggestive of congestive heart failure, including evidence of mild interstitial pulmonary edema and small bilateral pleural effusions, as detailed above. 4. Aortic atherosclerosis, in addition to left main and 3 vessel coronary artery disease. There is also evidence of prior LAD territory myocardial infarction(s), as above. 5. Severe dilatation of the pulmonic trunk (4.3 cm in diameter), concerning for pulmonary arterial hypertension. 6. Heterogeneous hepatic  steatosis. 7. Additional incidental findings, as above. Electronically Signed   By: Vinnie Langton M.D.   On: 10/02/2021 10:12   ECHOCARDIOGRAM COMPLETE  Result Date: 10/17/2021    ECHOCARDIOGRAM REPORT   Patient Name:   [name] [name] Date of Exam: 10/15/2021 Medical Rec #:  [id number]          Height:       58.0 in Accession #:    [id number]         Weight:       123.9 lb Date of Birth:  03-11-1946          BSA:          1.486 m Patient Age:    57 years           BP:           106/56 mmHg Patient Gender: F                  HR:           87 bpm. Exam Location:  Inpatient Procedure: 2D Echo, 3D Echo, Cardiac Doppler and Color Doppler Indications:    ; I06.0 Rheumatic aortic stenosis  History:  Patient has prior history of Echocardiogram examinations, most                 recent 10/14/2021. CHF, Previous Myocardial Infarction, Abnormal                 ECG and Pacemaker, Aortic Valve Disease and Mitral Valve                 Disease, Arrythmias:Atrial Fibrillation and Bradycardia,                 Signs/Symptoms:Dyspnea and Shortness of Breath; Risk                 Factors:Hypertension.                 Aortic Valve: 23 mm Sapien prosthetic, stented (TAVR) valve is                 present in the aortic position.                 Mitral Valve: 27 mm mechanical valve valve is present in the                 mitral position.  Sonographer:    Sheralyn Boatman RDCS Referring Phys: [id number] JILL D MCDANIEL  Sonographer Comments: Technically difficult study due to poor echo windows. IMPRESSIONS  1. Inferior septal and apical hypokinesis . Left ventricular ejection fraction, by estimation, is 25 to 30%. The left ventricle has severely decreased function. The left ventricle demonstrates regional wall motion abnormalities (see scoring diagram/findings for description). The left ventricular internal cavity size was mildly dilated. There is severe asymmetric left ventricular hypertrophy of the basal and septal segments. Left  ventricular diastolic parameters are indeterminate.  2. Pacing wires in RA/RV . Right ventricular systolic function is normal. The right ventricular size is normal. There is moderately elevated pulmonary artery systolic pressure.  3. Left atrial size was severely dilated.  4. Right atrial size was mildly dilated.  5. Normal appearing mechanical 27 mm [employer] Jude AVR no PVL. The mitral valve has been repaired/replaced. No evidence of mitral valve regurgitation. There is a 27 mm mechanical valve present in the mitral position.  6. Tricuspid valve regurgitation is moderate to severe.  7. 12.27.22 post TAVR with 23 mm Sapien 3 valve previous PVL gone mean gradient 10.6 mm peak 20.9 mm AVA 2.6 cm2 DVI 0.71 . The aortic valve has been repaired/replaced. Aortic valve regurgitation is not visualized. There is a 23 mm Sapien prosthetic (TAVR) valve present in the aortic position.  8. Aortic dilatation noted. There is moderate dilatation of the ascending aorta, measuring 42 mm. FINDINGS  Left Ventricle: Inferior septal and apical hypokinesis. Left ventricular ejection fraction, by estimation, is 25 to 30%. The left ventricle has severely decreased function. The left ventricle demonstrates regional wall motion abnormalities. The left ventricular internal cavity size was mildly dilated. There is severe asymmetric left ventricular hypertrophy of the basal and septal segments. Left ventricular diastolic parameters are indeterminate. Right Ventricle: Pacing wires in RA/RV. The right ventricular size is normal. Right vetricular wall thickness was not assessed. Right ventricular systolic function is normal. There is moderately elevated pulmonary artery systolic pressure. The tricuspid regurgitant velocity is 3.27 m/s, and with an assumed right atrial pressure of 15 mmHg, the estimated right ventricular systolic pressure is 57.8 mmHg. Left Atrium: Left atrial size was severely dilated. Right Atrium: Right atrial size was mildly dilated.  Pericardium: There is no  evidence of pericardial effusion. Mitral Valve: Normal appearing mechanical 27 mm St Jude AVR no PVL. The mitral valve has been repaired/replaced. No evidence of mitral valve regurgitation. There is a 27 mm mechanical valve present in the mitral position. MV peak gradient, 9.7 mmHg. The mean mitral valve gradient is 3.5 mmHg. Tricuspid Valve: The tricuspid valve is normal in structure. Tricuspid valve regurgitation is moderate to severe. Aortic Valve: 12.27.22 post TAVR with 23 mm Sapien 3 valve previous PVL gone mean gradient 10.6 mm peak 20.9 mm AVA 2.6 cm2 DVI 0.71. The aortic valve has been repaired/replaced. Aortic valve regurgitation is not visualized. Aortic valve mean gradient measures 10.6 mmHg. Aortic valve peak gradient measures 20.9 mmHg. Aortic valve area, by VTI measures 2.96 cm. There is a 23 mm Sapien prosthetic, stented (TAVR) valve present in the aortic position. Pulmonic Valve: The pulmonic valve was normal in structure. Pulmonic valve regurgitation is mild. Aorta: Aortic dilatation noted. There is moderate dilatation of the ascending aorta, measuring 42 mm. IAS/Shunts: No atrial level shunt detected by color flow Doppler.  LEFT VENTRICLE PLAX 2D LVIDd:         4.40 cm LVIDs:         3.80 cm LV PW:         1.50 cm LV IVS:        1.80 cm LVOT diam:     2.30 cm      3D Volume EF: LV SV:         118          3D EF:        18 % LV SV Index:   79           LV EDV:       144 ml LVOT Area:     4.15 cm     LV ESV:       119 ml                             LV SV:        26 ml  LV Volumes (MOD) LV vol d, MOD A2C: 137.0 ml LV vol d, MOD A4C: 101.0 ml LV vol s, MOD A2C: 76.3 ml LV vol s, MOD A4C: 71.3 ml LV SV MOD A2C:     60.7 ml LV SV MOD A4C:     101.0 ml LV SV MOD BP:      39.7 ml RIGHT VENTRICLE            IVC RV S prime:     6.05 cm/s  IVC diam: 2.10 cm TAPSE (M-mode): 1.0 cm LEFT ATRIUM            Index        RIGHT ATRIUM           Index LA diam:      6.30 cm  4.24 cm/m   RA  Area:     13.40 cm LA Vol (A2C): 81.0 ml  54.52 ml/m  RA Volume:   36.70 ml  24.70 ml/m LA Vol (A4C): 122.0 ml 82.11 ml/m  AORTIC VALVE                     PULMONIC VALVE AV Area (Vmax):    2.78 cm      PR End Diast Vel: 2.10 msec AV Area (Vmean):   2.59 cm AV Area (VTI):  2.96 cm AV Vmax:           228.40 cm/s AV Vmean:          148.400 cm/s AV VTI:            0.397 m AV Peak Grad:      20.9 mmHg AV Mean Grad:      10.6 mmHg LVOT Vmax:         153.00 cm/s LVOT Vmean:        92.467 cm/s LVOT VTI:          0.283 m LVOT/AV VTI ratio: 0.71  AORTA Ao Root diam: 4.00 cm Ao Asc diam:  4.20 cm MITRAL VALVE                TRICUSPID VALVE MV Area (PHT): 3.37 cm     TR Peak grad:   42.8 mmHg MV Area VTI:   3.07 cm     TR Vmax:        327.00 cm/s MV Peak grad:  9.7 mmHg MV Mean grad:  3.5 mmHg     SHUNTS MV Vmax:       1.56 m/s     Systemic VTI:  0.28 m MV Vmean:      75.5 cm/s    Systemic Diam: 2.30 cm MV Decel Time: 225 msec MV E velocity: 167.00 cm/s Jenkins Rouge MD Electronically signed by Jenkins Rouge MD Signature Date/Time: 10/17/2021/10:10:45 AM    Final    ECHOCARDIOGRAM COMPLETE  Result Date: 10/12/2021    ECHOCARDIOGRAM REPORT   Patient Name:   [name] [name] Date of Exam: 10/12/2021 Medical Rec #:  [id number]          Height:       58.0 in Accession #:    [id number]         Weight:       117.5 lb Date of Birth:  February 16, 1946          BSA:          1.453 m Patient Age:    40 years           BP:           144/57 mmHg Patient Gender: F                  HR:           52 bpm. Exam Location:  Inpatient Procedure: 2D Echo Indications:     aortic stenosis. Acute systolic chf.  History:         Patient has prior history of Echocardiogram examinations.                  Aortic Valve Disease and rheumatic heart disease.                   Mitral Valve: mechanical valve valve is present in the mitral                  position.  Sonographer:     Johny Chess RDCS Referring Phys:  [id number] Donato Heinz Diagnosing Phys: Oswaldo Milian MD IMPRESSIONS  1. Left ventricular ejection fraction, by estimation, is 30 to 35%. The left ventricle has moderately decreased function. The left ventricle demonstrates regional wall motion abnormalities. Abnormal (paradoxical) septal motion, consistent with RV pacemaker.     The left ventricular internal cavity size was mildly dilated. There is severe asymmetric left ventricular hypertrophy of the basal-septal  segment. Left ventricular diastolic parameters are indeterminate.  2. Right ventricular systolic function is mildly reduced. The right ventricular size is normal. There is mildly elevated pulmonary artery systolic pressure. The estimated right ventricular systolic pressure is [id number] mmHg.  3. Left atrial size was severely dilated.  4. Right atrial size was mildly dilated.  5. There is a mechanical valve present in the mitral position     No evidence of mitral valve regurgitation. The mean mitral valve gradient is 3.0 mmHg at 68bpm.  6. Tricuspid valve regurgitation is moderate.  7. The inferior vena cava is normal in size with <50% respiratory variability, suggesting right atrial pressure of 8 mmHg.  8. The aortic valve is calcified. There is severe calcifcation of the aortic valve. Aortic valve regurgitation is moderate. Severe aortic stenosis. Vmax 4.41m/s, MG 53 mmHg, AVA 0.4 cm^2, DI 0.15 FINDINGS  Left Ventricle: Left ventricular ejection fraction, by estimation, is 30 to 35%. The left ventricle has moderately decreased function. The left ventricle demonstrates regional wall motion abnormalities. The left ventricular internal cavity size was mildly dilated. There is severe asymmetric left ventricular hypertrophy of the basal-septal segment. Abnormal (paradoxical) septal motion, consistent with RV pacemaker. Left ventricular diastolic parameters are indeterminate. Right Ventricle: The right ventricular size is normal. Right vetricular wall thickness was not  well visualized. Right ventricular systolic function is mildly reduced. There is mildly elevated pulmonary artery systolic pressure. The tricuspid regurgitant velocity is 2.62 m/s, and with an assumed right atrial pressure of 8 mmHg, the estimated right ventricular systolic pressure is [id number] mmHg. Left Atrium: Left atrial size was severely dilated. Right Atrium: Right atrial size was mildly dilated. Pericardium: Trivial pericardial effusion is present. Mitral Valve: The mitral valve has been repaired/replaced. No evidence of mitral valve regurgitation. There is a mechanical valve present in the mitral position. MV peak gradient, 10.1 mmHg. The mean mitral valve gradient is 3.0 mmHg. Tricuspid Valve: The tricuspid valve is normal in structure. Tricuspid valve regurgitation is moderate. Aortic Valve: The aortic valve is calcified. There is severe calcifcation of the aortic valve. Aortic valve regurgitation is moderate. Aortic regurgitation PHT measures 392 msec. Severe aortic stenosis is present. Aortic valve mean gradient measures 53.0  mmHg. Aortic valve peak gradient measures 92.2 mmHg. Aortic valve area, by VTI measures 0.38 cm. Pulmonic Valve: The pulmonic valve was grossly normal. Pulmonic valve regurgitation is trivial. Aorta: The aortic root is normal in size and structure. Venous: The inferior vena cava is normal in size with less than 50% respiratory variability, suggesting right atrial pressure of 8 mmHg. IAS/Shunts: The interatrial septum was not well visualized.  LEFT VENTRICLE PLAX 2D LVIDd:         5.60 cm      Diastology LVIDs:         4.30 cm      LV e' medial:    5.00 cm/s LV PW:         1.10 cm      LV E/e' medial:  25.4 LV IVS:        1.00 cm      LV e' lateral:   9.03 cm/s LVOT diam:     1.90 cm      LV E/e' lateral: 14.1 LV SV:         45 LV SV Index:   31 LVOT Area:     2.84 cm  LV Volumes (MOD) LV vol d, MOD A2C: 139.0 ml LV vol d, MOD A4C: 76.8 ml  LV vol s, MOD A2C: 81.7 ml LV vol s, MOD A4C:  53.0 ml LV SV MOD A2C:     57.3 ml LV SV MOD A4C:     76.8 ml LV SV MOD BP:      38.6 ml RIGHT VENTRICLE            IVC RV S prime:     6.74 cm/s  IVC diam: 1.60 cm TAPSE (M-mode): 1.0 cm LEFT ATRIUM              Index         RIGHT ATRIUM           Index LA Vol (A2C):   124.0 ml 85.36 ml/m   RA Area:     16.60 cm LA Vol (A4C):   148.0 ml 101.88 ml/m  RA Volume:   46.80 ml  32.22 ml/m LA Biplane Vol: 139.0 ml 95.68 ml/m  AORTIC VALVE AV Area (Vmax):    0.42 cm AV Area (Vmean):   0.38 cm AV Area (VTI):     0.38 cm AV Vmax:           480.00 cm/s AV Vmean:          341.000 cm/s AV VTI:            1.180 m AV Peak Grad:      92.2 mmHg AV Mean Grad:      53.0 mmHg LVOT Vmax:         70.70 cm/s LVOT Vmean:        45.650 cm/s LVOT VTI:          0.158 m LVOT/AV VTI ratio: 0.13 AI PHT:            392 msec  AORTA Ao Root diam: 3.40 cm Ao Asc diam:  3.70 cm MITRAL VALVE                TRICUSPID VALVE MV Area (PHT): 4.36 cm     TR Peak grad:   27.5 mmHg MV Area VTI:   1.19 cm     TR Vmax:        262.00 cm/s MV Peak grad:  10.1 mmHg MV Mean grad:  3.0 mmHg     SHUNTS MV Vmax:       1.59 m/s     Systemic VTI:  0.16 m MV Vmean:      80.5 cm/s    Systemic Diam: 1.90 cm MV Decel Time: 174 msec MV E velocity: 127.00 cm/s MV A velocity: 52.10 cm/s MV E/A ratio:  2.44 Oswaldo Milian MD Electronically signed by Oswaldo Milian MD Signature Date/Time: 10/12/2021/1:10:10 PM    Final (Updated)    ECHOCARDIOGRAM LIMITED  Result Date: 10/14/2021    ECHOCARDIOGRAM LIMITED REPORT   Patient Name:   [name] [name] Date of Exam: 10/14/2021 Medical Rec #:  [id number]          Height:       58.0 in Accession #:    [id number]         Weight:       118.6 lb Date of Birth:  [birth year]          BSA:          1.458 m Patient Age:    30 years           BP:           150/58 mmHg Patient Gender: F  HR:           49 bpm. Exam Location:  Inpatient Procedure: Limited Echo, Cardiac Doppler, Color Doppler and 3D Echo  Indications:    TAVR  History:        Patient has prior history of Echocardiogram examinations. CHF,                 CAD and Previous Myocardial Infarction, Prior CABG and                 Pacemaker, Aortic Valve Disease and Mitral Valve Disease,                 Arrythmias:Bradycardia; Risk Factors:Hypertension. Aortic                 aneurysm. History of rheumatic fever.                 Aortic Valve: 23 mm Sapien prosthetic, stented (TAVR) valve is                 present in the aortic position. Procedure Date: 10/14/2021.  Sonographer:    Darlina Sicilian RDCS Referring Phys: [place]  1. Inferior septal and apical hypokinesis . Left ventricular ejection fraction, by estimation, is 30 to 35%. The left ventricle has moderately decreased function. The left ventricle demonstrates regional wall motion abnormalities (see scoring diagram/findings for description). The left ventricular internal cavity size was moderately dilated. There is moderate asymmetric left ventricular hypertrophy of the basal and septal segments.  2. Pacing wires RA/RV. Right ventricular systolic function is moderately reduced. The right ventricular size is moderately enlarged. There is normal pulmonary artery systolic pressure.  3. 27 mm bi leaflet mechanical MVR present . The mitral valve has been repaired/replaced. No evidence of mitral valve regurgitation. Procedure Date: 2002.  4. Tricuspid valve regurgitation is moderate to severe.  5. Pre TAVR: severe AS fused right and left cusp mean gradient 54 peak 89 mmHg     AVA 0.64 cm2         Post TAVR: well positioned 23 mm Sapien 3 valve After initial deployment two mild PVL's one at 10:00 and one at 4:00 on PSLX views The valve was then post dilated with only residual trivial PVL at 12:00 postion mean gradient 5 peak 9 mmHg AVA 3.1 cm2  . Aortic valve regurgitation is moderate. There is a 23 mm Sapien prosthetic (TAVR) valve present in the aortic position. Procedure Date:  10/14/2021. FINDINGS  Left Ventricle: Inferior septal and apical hypokinesis. Left ventricular ejection fraction, by estimation, is 30 to 35%. The left ventricle has moderately decreased function. The left ventricle demonstrates regional wall motion abnormalities. The left ventricular internal cavity size was moderately dilated. There is moderate asymmetric left ventricular hypertrophy of the basal and septal segments. Right Ventricle: Pacing wires RA/RV. The right ventricular size is moderately enlarged. Right ventricular systolic function is moderately reduced. There is normal pulmonary artery systolic pressure. The tricuspid regurgitant velocity is 2.55 m/s, and with an assumed right atrial pressure of 8 mmHg, the estimated right ventricular systolic pressure is [id number] mmHg. Mitral Valve: 27 mm bi leaflet mechanical MVR present. The mitral valve has been repaired/replaced. There is a St. Jude present in the mitral position. MV peak gradient, 9.1 mmHg. The mean mitral valve gradient is 3.0 mmHg. Tricuspid Valve: Tricuspid valve regurgitation is moderate to severe. Aortic Valve: Pre TAVR: severe AS fused right and left cusp mean gradient 54 peak 89  mmHg AVA 0.64 cm2 Post TAVR: well positioned 23 mm Sapien 3 valve After initial deployment two mild PVL's one at 10:00 and one at 4:00 on PSLX views The valve was then post dilated with only residual trivial PVL at 12:00 postion mean gradient 5 peak 9 mmHg AVA 3.1 cm2. Aortic valve regurgitation is moderate. Aortic regurgitation PHT measures 334 msec. Aortic valve mean gradient measures 21.7 mmHg. Aortic valve peak gradient measures 27.5 mmHg. Aortic valve area, by VTI measures 1.51 cm. There is a 23 mm Sapien prosthetic, stented (TAVR) valve present in the aortic position. Procedure Date: 10/14/2021. Additional Comments: A device lead is visualized. LEFT VENTRICLE PLAX 2D LVOT diam:     2.30 cm LV SV:         97 LV SV Index:   66 LVOT Area:     4.15 cm                           3D Volume EF:                          3D EF:        35 %                          LV EDV:       176 ml                          LV ESV:       114 ml                          LV SV:        61 ml AORTIC VALVE AV Area (Vmax):    2.12 cm AV Area (Vmean):   1.77 cm AV Area (VTI):     1.51 cm AV Vmax:           262.00 cm/s AV Vmean:          185.700 cm/s AV VTI:            0.638 m AV Peak Grad:      27.5 mmHg AV Mean Grad:      21.7 mmHg LVOT Vmax:         133.50 cm/s LVOT Vmean:        79.150 cm/s LVOT VTI:          0.232 m LVOT/AV VTI ratio: 0.36 AI PHT:            334 msec MITRAL VALVE             TRICUSPID VALVE MV Area VTI:  2.84 cm   TR Peak grad:   26.0 mmHg MV Peak grad: 9.1 mmHg   TR Vmax:        255.00 cm/s MV Mean grad: 3.0 mmHg MV Vmax:      1.50 m/s   SHUNTS MV Vmean:     69.6 cm/s  Systemic VTI:  0.23 m                          Systemic Diam: 2.30 cm Jenkins Rouge MD Electronically signed by Jenkins Rouge MD Signature Date/Time: 10/14/2021/12:43:06 PM    Final    Structural Heart Procedure  Result Date: 10/14/2021 See surgical note  for result.  CT ANGIO ABDOMEN PELVIS  W &/OR WO CONTRAST  Result Date: 10/02/2021 CLINICAL DATA:  75 year old female with history of severe aortic stenosis. Preprocedural study prior to potential transcatheter aortic valve replacement (TAVR) procedure. EXAM: CT ANGIOGRAPHY CHEST, ABDOMEN AND PELVIS TECHNIQUE: Multidetector CT imaging through the chest, abdomen and pelvis was performed using the standard protocol during bolus administration of intravenous contrast. Multiplanar reconstructed images and MIPs were obtained and reviewed to evaluate the vascular anatomy. CONTRAST:  184mL OMNIPAQUE IOHEXOL 350 MG/ML SOLN COMPARISON:  None. FINDINGS: CTA CHEST FINDINGS Cardiovascular: Heart size is severely enlarged. There is no significant pericardial fluid, thickening or pericardial calcification. There is aortic atherosclerosis, as well as atherosclerosis of the  great vessels of the mediastinum and the coronary arteries, including calcified atherosclerotic plaque in the left main, left anterior descending, left circumflex and right coronary arteries. Myocardial thinning and subendocardial hypoattenuation throughout the mid to distal LAD territory, likely sequela of prior LAD territory myocardial infarction(s). This is associated with some mild aneurysmal dilatation of the left ventricular apex. Severe dilatation of the pulmonic trunk (4.3 cm in diameter), concerning for pulmonary arterial hypertension. Severe thickening calcification of the aortic valve. 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 Calcifications of the mitral annulus. Left-sided pacemaker device in place with lead tips terminating in the right atrial appendage and right ventricular apex. Mediastinum/Lymph Nodes: No pathologically enlarged mediastinal or hilar lymph nodes. Hilar esophagus is unremarkable in appearance. No axillary lymphadenopathy. Lungs/Pleura: Small bilateral pleural effusions (right greater than left) lying dependently. Scattered areas of subsegmental atelectasis are noted in the lower lungs, along with near complete atelectasis of the right middle lobe. There is a background of mild diffuse ground-glass attenuation and interlobular septal thickening throughout the lungs bilaterally, suggesting the presence of mild interstitial pulmonary edema. Musculoskeletal/Soft Tissues: Median sternotomy wires. There are no aggressive appearing lytic or blastic lesions noted in the visualized portions of the skeleton. CTA ABDOMEN AND PELVIS FINDINGS Hepatobiliary: Heterogeneous attenuation throughout the hepatic parenchyma, with large areas of low attenuation most evident in the right lobe of the liver, favored to reflect a background of heterogeneous hepatic steatosis. No definite focal cystic or solid hepatic lesions. No intra or extrahepatic biliary ductal dilatation. Status  post cholecystectomy. Pancreas: No pancreatic mass. No pancreatic ductal dilatation. No pancreatic or peripancreatic fluid collections or inflammatory changes. Spleen: Small splenule inferior to the spleen. Otherwise, unremarkable. Adrenals/Urinary Tract: Mild multifocal cortical thinning in both kidneys. Subcentimeter low-attenuation lesion in the upper pole of the left kidney, too small to characterize, but statistically likely to represent a tiny cyst. Bilateral adrenal glands are normal in appearance. No hydroureteronephrosis. Urinary bladder is normal in appearance. Stomach/Bowel: The appearance of the stomach is normal. There is no pathologic dilatation of small bowel or colon. The appendix is not confidently identified and may be surgically absent. Regardless, there are no inflammatory changes noted adjacent to the cecum to suggest the presence of an acute appendicitis at this time. Vascular/Lymphatic: Aortic atherosclerosis, with vascular findings and measurements pertinent to potential TAVR procedure, as detailed below. No aneurysm or dissection noted in the abdominal or pelvic vasculature. No lymphadenopathy identified in the abdomen or pelvis. Reproductive: Uterus and ovaries are atrophic. Other: No significant volume of ascites.  No pneumoperitoneum. Musculoskeletal: There are no aggressive appearing lytic or blastic lesions noted in the visualized portions of the skeleton. VASCULAR MEASUREMENTS PERTINENT TO TAVR: AORTA: Minimal Aortic Diameter-13 x 14 mm Severity of Aortic Calcification-moderate to severe RIGHT  PELVIS: Right Common Iliac Artery - Minimal Diameter-7.7 x 7.1 mm Tortuosity-mild Calcification-moderate Right External Iliac Artery - Minimal Diameter-7.1 x 7.1 mm Tortuosity-mild-to-moderate Calcification-none Right Common Femoral Artery - Minimal Diameter-6.5 x 6.3 mm Tortuosity-mild Calcification-none LEFT PELVIS: Left Common Iliac Artery - Minimal Diameter-8.2 x 8.7 mm Tortuosity-mild  Calcification-moderate Left External Iliac Artery - Minimal Diameter-6.3 x 7.2 mm Tortuosity-mild Calcification-none Left Common Femoral Artery - Minimal Diameter-6.8 x 6.8 mm Tortuosity-mild Calcification-none Review of the MIP images confirms the above findings. IMPRESSION: 1. Vascular findings and measurements pertinent to potential TAVR procedure, as detailed above. 2. Severe thickening calcification of the aortic valve, compatible with reported clinical history of severe aortic stenosis. 3. Cardiomegaly with findings suggestive of congestive heart failure, including evidence of mild interstitial pulmonary edema and small bilateral pleural effusions, as detailed above. 4. Aortic atherosclerosis, in addition to left main and 3 vessel coronary artery disease. There is also evidence of prior LAD territory myocardial infarction(s), as above. 5. Severe dilatation of the pulmonic trunk (4.3 cm in diameter), concerning for pulmonary arterial hypertension. 6. Heterogeneous hepatic steatosis. 7. Additional incidental findings, as above. Electronically Signed   By: Vinnie Langton M.D.   On: 10/02/2021 10:12   US Abdomen Limited RUQ (LIVER/GB)  Result Date: 10/11/2021 CLINICAL DATA:  Transaminitis. EXAM: ULTRASOUND ABDOMEN LIMITED RIGHT UPPER QUADRANT COMPARISON:  10/02/2021. FINDINGS: Gallbladder: Surgically absent. Common bile duct: Diameter: 4.6 mm. Liver: No focal lesion identified. The liver has a slightly nodular border inferiorly. Increased parenchymal echogenicity. Portal vein is patent on color Doppler imaging with normal direction of blood flow towards the liver. Other: A trace amount of free fluid is noted in the right upper quadrant. Small right pleural effusion. IMPRESSION: 1. Hepatic steatosis. The liver has a slightly nodular border inferiorly which may be associated with underlying cirrhosis. 2. Trace ascites. 3. Small right pleural effusion. 4. Status post cholecystectomy. Electronically Signed    By: Brett Fairy M.D.   On: 10/11/2021 22:49   Disposition   Pt is being discharged home today in good condition.  Follow-up Plans & Appointments    Follow-up Information     Eileen Stanford, PA-C Follow up on 10/24/2021.   Specialties: Cardiology, Radiology Why: at 1200pm. Please arrive to your appointment by 11:45am Contact information: [place] [address] [phone]                Discharge Instructions     ([place]) Call MD:  Anytime you have any of the following symptoms: 1) 3 pound weight gain in 24 hours or 5 pounds in 1 week 2) shortness of breath, with or without a dry hacking cough 3) swelling in the hands, feet or stomach 4) if you have to sleep on extra pillows at night in order to breathe.   Complete by: As directed    Call MD for:  difficulty breathing, headache or visual disturbances   Complete by: As directed    Call MD for:  extreme fatigue   Complete by: As directed    Call MD for:  hives   Complete by: As directed    Call MD for:  persistant dizziness or light-headedness   Complete by: As directed    Call MD for:  persistant nausea and vomiting   Complete by: As directed    Call MD for:  redness, tenderness, or signs of infection (pain, swelling, redness, odor or green/yellow discharge around incision site)   Complete by: As  directed    Call MD for:  severe uncontrolled pain   Complete by: As directed    Call MD for:  temperature >100.4   Complete by: As directed    Diet - low sodium heart healthy   Complete by: As directed    Discharge instructions   Complete by: As directed    You will restart Lovenox 60mg  twice daily through Monday 10/21/20 and will also resume your home Coumadin dose at 7.5mg  by mouth this evening. You will need to check your INR level on Monday and call your PCP by Tuesday when they re-open after the holiday for further Coumadin instructions.    ACTIVITY AND EXERCISE  Daily  activity and exercise are an important part of your recovery. People recover at different rates depending on their general health and type of valve procedure.  Most people recovering from TAVR feel better relatively quickly   No lifting, pushing, pulling more than 10 pounds (examples to avoid: groceries, vacuuming, gardening, golfing):             - For one week with a procedure through the groin.             - For six weeks for procedures through the chest wall or neck. NOTE: You will typically see one of our providers 7-14 days after your procedure to discuss [place] the above activities.      DRIVING  Do not drive until you are seen for follow up and cleared by a provider. Generally, we ask patient to not drive for 1 week after their procedure.  If you have been told by your doctor in the past that you may not drive, you must talk with him/her before you begin driving again.   DRESSING  Groin site: you may leave the clear dressing over the site for up to one week or until it falls off.   HYGIENE  If you had a femoral (leg) procedure, you may take a shower when you return home. After the shower, pat the site dry. Do NOT use powder, oils or lotions in your groin area until the site has completely healed.  If you had a chest procedure, you may shower when you return home unless specifically instructed not to by your discharging practitioner.             - DO NOT scrub incision; pat dry with a towel.             - DO NOT apply any lotions, oils, powders to the incision.             - No tub baths / swimming for at least 2 weeks.  If you notice any fevers, chills, increased pain, swelling, bleeding or pus, please contact your doctor.   ADDITIONAL INFORMATION  If you are going to have an upcoming dental procedure, please contact our office as you will require antibiotics ahead of time to prevent infection on your heart valve.    If you have any questions or concerns you can  call the structural heart phone during normal business hours 8am-4pm. If you have an urgent need after hours or weekends please call [phone] to talk to the on call provider for general cardiology. If you have an emergency that requires immediate attention, please call 911.    After TAVR Checklist  Check  Test Description  Follow up appointment in 1-2 weeks  You will see our structural heart advanced practice provider. Your  incision sites will be checked and you will be cleared to drive and resume all normal activities if you are doing well.    1 month echo and follow up  You will have an echo to check on your new heart valve and be seen back in the office by a structural heart advanced practice provider.  Follow up with your primary cardiologist You will need to be seen by your primary cardiologist in the following 3-6 months after your 1 month appointment in the valve clinic. Often times your Plavix or Aspirin will be discontinued during this time, but this is decided on a case by case basis.   1 year echo and follow up You will have another echo to check on your heart valve after 1 year and be seen back in the office by a structural heart advanced practice provider. This your last structural heart visit.  Bacterial endocarditis prophylaxis  You will have to take antibiotics for the rest of your life before all dental procedures (even teeth cleanings) to protect your heart valve. Antibiotics are also required before some surgeries. Please check with your cardiologist before scheduling any surgeries. Also, please make sure to tell us if you have a penicillin allergy as you will require an alternative antibiotic.   If the dressing is still on your incision site when you go home, remove it on the third day after your surgery date. Remove dressing if it begins to fall off, or if it is dirty or damaged before the third day.   Complete by: As directed    Increase activity slowly   Complete by: As  directed       Discharge Medications   Allergies as of 10/17/2021       Reactions   Atenolol Other (See Comments)   HR and pulse dropped   Metoprolol Succinate [metoprolol] Other (See Comments)   HR and pulse dropped   Baclofen Anxiety, Other (See Comments)   Makes the patient feel anxious the day after taking this   Latex Rash        Medication List     TAKE these medications    albuterol 108 (90 Base) MCG/ACT inhaler Commonly known as: VENTOLIN HFA Inhale 2 puffs into the lungs every 6 (six) hours as needed for shortness of breath.   ALPRAZolam 1 MG tablet Commonly known as: XANAX Take 0.5-1 mg by mouth 2 (two) times daily as needed for anxiety.   atorvastatin 80 MG tablet Commonly known as: LIPITOR Take 80 mg by mouth at bedtime.   baclofen 20 MG tablet Commonly known as: LIORESAL Take 10 mg by mouth every 8 (eight) hours as needed for muscle spasms.   digoxin 0.125 MG tablet Commonly known as: Lanoxin Take 1 tablet (125 mcg total) by mouth daily.   docusate sodium 100 MG capsule Commonly known as: COLACE Take 1 capsule (100 mg total) by mouth 2 (two) times daily.   enoxaparin 60 MG/0.6ML injection Commonly known as: LOVENOX Inject 0.6 mLs (60 mg total) into the skin every 12 (twelve) hours for 3 days. What changed: when to take this   furosemide 20 MG tablet Commonly known as: LASIX Take 40 mg by mouth 2 (two) times daily.   Klor-Con M20 20 MEQ tablet Generic drug: potassium chloride SA Take 40 mEq by mouth See admin instructions. Crush and mix 40 mEq with applesauce and eat once a day   lactose free nutrition Liqd Take 237 mLs by mouth 2 (two) times  daily.   levothyroxine 75 MCG tablet Commonly known as: SYNTHROID Take 75 mcg by mouth daily before breakfast.   nitroGLYCERIN 0.4 MG SL tablet Commonly known as: NITROSTAT Place 0.4 mg under the tongue every 5 (five) minutes as needed for chest pain.   oxyCODONE 5 MG immediate release  tablet Commonly known as: Oxy IR/ROXICODONE Take 0.5-1 tablets (2.5-5 mg total) by mouth every 4 (four) hours as needed for moderate pain.   pantoprazole 40 MG tablet Commonly known as: PROTONIX Take 1 tablet (40 mg total) by mouth daily.   warfarin 7.5 MG tablet Commonly known as: COUMADIN Take 1 tablet (7.5 mg total) by mouth daily at 4 PM.   zolpidem 10 MG tablet Commonly known as: AMBIEN Take 5-10 mg by mouth at bedtime.               Durable Medical Equipment  (From admission, onward)           Start     Ordered   10/17/21 0928  For home use only DME 4 wheeled rolling walker with seat  Once       Question:  Patient needs a walker to treat with the following condition  Answer:  Weakness   10/17/21 0927              Discharge Care Instructions  (From admission, onward)           Start     Ordered   10/17/21 0000  If the dressing is still on your incision site when you go home, remove it on the third day after your surgery date. Remove dressing if it begins to fall off, or if it is dirty or damaged before the third day.        10/17/21 1128            Outstanding Labs/Studies   BMET   Duration of Discharge Encounter   Greater than 30 minutes including physician time.  SignedKathyrn Drown, NP 10/17/2021, 11:28 AM [phone]   Patient seen, examined. Available data reviewed. Agree with findings, assessment, and plan as outlined by Kathyrn Drown, NP.  The patient is independently interviewed and examined.  She is alert, oriented, in no distress.  HEENT is normal, JVP is mildly elevated, lungs have improved air movement with diminished breath sounds in the bases otherwise clear, heart is regular with a 2/6 systolic murmur at the left lower sternal border and a normal mechanical S1.  Extremity edema has improved and is now trace in the lower legs.  Bilateral groin sites are clear.  The patient is medically stable for hospital discharge today.   We will transition her from IV to oral furosemide 40 mg twice daily.  Potassium will be repleted.  She is very eager to be discharged.  She will be bridged to a therapeutic INR with Lovenox now that she is a few days out from surgery and at lower bleeding risk.  Otherwise as outlined above.  Close hospital follow-up is arranged next week.  Sherren Mocha, M.D. 10/17/2021 1:20 PM

## 2021-10-17 NOTE — Progress Notes (Signed)
[name] Cala Bradford picked up pt. AVS reviewed with pt and all questions answered. Pt left with all of her belongings. PA notified of pt's departure.

## 2021-10-17 NOTE — Progress Notes (Signed)
Attempted to call both of pt's daughters for discharge planning. Both phone calls went to voicemail. Pt ready for discharge. Pt contacted [facility] and all meds are ready for pickup. Pt anxious to leave so that she can get her meds and get home.

## 2021-10-21 ENCOUNTER — Telehealth: Payer: Self-pay

## 2021-10-21 NOTE — Telephone Encounter (Signed)
Patient contacted regarding discharge from [facility] on 10/17/2021.  Patient understands to follow up with provider Carlean Jews PA-C on 10/24/2021 at 12:00 PM at [facility] office. Patient understands discharge instructions? yes Patient understands medications and regiment? yes Patient understands to bring all medications to this visit? Yes  The pt had questions in regards to her medications.  She wanted to know what pantoprazole was and why it was prescribed.  I educated the pt on this medication.  The pt also said she has already been contacted today by her PCP to follow up on her INR result.  At this time the pt is not therapeutic and she will continue lovenox. I instructed the pt to bring her medications to her Friday visit so that we can make sure she is taking her medications correctly. Pt agreed with plan.

## 2021-10-24 ENCOUNTER — Ambulatory Visit: Payer: Medicare Other | Admitting: Physician Assistant

## 2021-10-24 NOTE — Progress Notes (Deleted)
HEART AND [place]                                     Cardiology Office Note:    Date:  10/24/2021   ID:  [name], DOB Oct 09, 1946, MRN [id number]  PCP:  [name] Hipps, MD  [facility] Cardiologist:  Sherren Mocha, MD  [facility] Electrophysiologist:  None   Referring MD: [name] Hipps, MD   [facility] s/p TAVR  History of Present Illness:    [name] [name] is a 76 y.o. 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   She had rheumatic fever at age 71 and went on to develop rheumatic heart disease. She ultimately underwent mechanical mitral valve replacement approximately 20 years ago at [facility]. She has tolerated long-term warfarin therapy well without any recent bleeding problems. She underwent permanent pacemaker placement (dual-chamber) in 2009 by Dr Caryl Comes. The patient's recent echocardiogram from July 22, 2021 demonstrates severe global LV systolic dysfunction with LVEF 30 to 35%, severe calcification and restriction of the aortic valve leaflets, mild to moderate aortic valve insufficiency, and critical aortic stenosis with a mean transvalvular gradient of 53 mmHg, peak systolic velocity of 4.5 m/s, and calculated aortic valve area less than 0.5 cm.  She actually underwent TAVR evaluation at [facility], but did not follow through with completing her work-up there.  She had CT angiography studies done earlier this year.  She appeared to have access for transfemoral TAVR. She was then evaluated at [facility] but did not want to be treated at an academic center and requested referral to [place] [facility]. She was seen by Dr. Burt Knack in the office on 08/22/21 and  complained of shortness of breath and fatigue with low-level activity. She described progressive shortness of breath and generalized fatigue over the past year, now with limitation during low-level activities.  She can only walk short distances without feeling breathless.     She was then admitted 11/8-11/12 for influenza A and CHF. She was treated with IV lasix and Tamiflu. She had agitation and was noncompliant during this admission refusing care and being aggressive with staff. Pt refused further lasix stating she was dehydrated and home HCTZ was discontinued at discharge.    We attempted to bring her back into the office for re-evaluation with structural heart, but patient no showed and then ultimately refused an office visit saying she wanted to save money and wanted to just proceed with CT scans and appt with Dr. Cyndia Bent. These were set up for 10/01/21 but, unfortunately, the patient was too volume overloaded to lay for scanning and she was directly admitted to the hospital for IV diuresis. Once able to lye flat CT scans were completed 10/02/21. L/RHC on 10/03/21 showed patent coronary arteries with diffuse nonobstructive CAD, continued patency of the mid LAD stent. She was ultimately discharged home on Lovenox and brought back 12/24 for direct admission for heparin prior to TAVR. When admitted she was found to be volume overloaded and required IV diuresis. She underwent successful TAVR with a 23 mm Edwards Sapien 3 Ultra Resilia  via the TF approach on 10/14/21. Post operative echo showed EF 25-30%, normally functioning TAVR with a mean gradient of 10.6 mmHg and no PVL as well as a normal appearing mechanical MVR, mod-severe TR and moderate dilatation of the ascending aorta, measuring 42 mm. She also had transaminitis, hypokalemia and hyponatremia. This was felt to be related to acute CHF and diuresis.   Today the patient presents to clinic for follow up.    Past Medical History:  Diagnosis Date    Anxiety    Aortic aneurysm [facility])    Atrial arrhythmia    With prior history of atrial flutter ablation   Bradycardia    s/p pacemaker implantation   CONGESTIVE HEART FAILURE 07/31/2010   Qualifier: Diagnosis of  By: Caryl Comes, MD, Remus Blake    GERD (gastroesophageal reflux disease)    Hypertension    Hypothyroidism    Long Q-T syndrome    Myocardial infarct ([place]) 11/20/2011   Stent placement for 100% blockage of LAD   PACEMAKER, PERMANENT 2009   Medtronic Adapta serial # [id number]   Rheumatic heart disease    With mitral valve replacement, tricuspid regurgitation moderate to severe, significant left atrial enlargment   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 Burt Knack and Dr. Cyndia Bent   TRICUSPID REGURGITATION 12/23/2010   Qualifier: Diagnosis of  By: Caryl Comes, MD, Remus Blake    Unspecified essential hypertension 07/31/2010   Qualifier: Diagnosis of  By: Caryl Comes, MD, [facility], Mack Guise     Past Surgical History:  Procedure Laterality Date   APPENDECTOMY     CARDIAC ELECTROPHYSIOLOGY STUDY AND ABLATION     Stent and pacemaker placement   CHOLECYSTECTOMY     CORONARY ANGIOPLASTY WITH STENT PLACEMENT Left 09/13/2015   Dr. Gwenith Spitz at [facility] [place] stent   INTRAOPERATIVE TRANSTHORACIC ECHOCARDIOGRAM N/A 10/14/2021   Procedure: INTRAOPERATIVE TRANSTHORACIC ECHOCARDIOGRAM;  Surgeon: Sherren Mocha, MD;  Location: [place];  Service: Open Heart Surgery;  Laterality: N/A;   MITRAL VALVE REPLACEMENT  2001   mechanical valve   OOPHORECTOMY     PACEMAKER INSERTION  11/20/2007   Dual chamber pacemaker implantation with rapid ventricular pacing   RIGHT/LEFT HEART CATH AND CORONARY ANGIOGRAPHY N/A 10/03/2021   Procedure: RIGHT/LEFT HEART CATH AND CORONARY ANGIOGRAPHY;  Surgeon: Sherren Mocha, MD;  Location: [place] [facility];  Service: Cardiovascular;  Laterality: N/A;   SALPINGECTOMY Bilateral    TONSILLECTOMY      TRANSCATHETER AORTIC VALVE REPLACEMENT, TRANSFEMORAL N/A 10/14/2021   Procedure: TRANSCATHETER AORTIC VALVE REPLACEMENT, TRANSFEMORAL;  Surgeon: Sherren Mocha, MD;  Location: [place];  Service: Open Heart Surgery;  Laterality: N/A;    Current Medications: No outpatient medications have been marked as taking for the 10/24/21 encounter (Appointment) with Eileen Stanford, PA-C.     Allergies:   Atenolol, Metoprolol succinate [metoprolol], Baclofen, and Latex   Social History   Socioeconomic History   Marital status: Single    Spouse name: Not on file   Number of children: Not on file   Years of education: Not on file   Highest education level: Not on file  Occupational History   Not on file  Tobacco Use   Smoking status: Never   Smokeless tobacco: Never  Substance and Sexual Activity   Alcohol use: Never   Drug use: Never   Sexual activity: Not on file  Other Topics Concern   Not on file  Social History Narrative  Not on file   Social Determinants of Health   Financial Resource Strain: Not on file  Food Insecurity: Not on file  Transportation Needs: Not on file  Physical Activity: Not on file  Stress: Not on file  Social Connections: Not on file     Family History: The patient's family history includes Heart attack in her paternal grandmother; Heart disease in her father; Leukemia in her mother; Stroke in her maternal grandmother and mother.  ROS:   Please see the history of present illness.    All other systems reviewed and are negative.  EKGs/Labs/Other Studies Reviewed:    The following studies were reviewed today:  TAVR OPERATIVE NOTE     Date of Procedure:                10/14/2021   Preoperative Diagnosis:      Severe Aortic Stenosis    Postoperative Diagnosis:    Same    Procedure:        Transcatheter Aortic Valve Replacement - Percutaneous Right Transfemoral Approach             Edwards Sapien 3 Ultra Resilia THV (size 23 mm, model #  9755RSL, serial # [id number])              Co-Surgeons:                        Gaye Pollack, MD and Sherren Mocha, MD     Anesthesiologist:                  Albertha Ghee, MD   Echocardiographer:              Jenkins Rouge, MD   Pre-operative Echo Findings: Severe aortic stenosis Moderate left ventricular systolic dysfunction   Post-operative Echo Findings: Trace paravalvular leak Unchanged moderate left ventricular systolic dysfunction  _____________    Echo 10/15/21: IMPRESSIONS   1. Inferior septal and apical hypokinesis . Left ventricular ejection  fraction, by estimation, is 25 to 30%. The left ventricle has severely  decreased function. The left ventricle demonstrates regional wall motion  abnormalities (see scoring  diagram/findings for description). The left ventricular internal cavity  size was mildly dilated. There is severe asymmetric left ventricular  hypertrophy of the basal and septal segments. Left ventricular diastolic  parameters are indeterminate.   2. Pacing wires in RA/RV . Right ventricular systolic function is normal.  The right ventricular size is normal. There is moderately elevated  pulmonary artery systolic pressure.   3. Left atrial size was severely dilated.   4. Right atrial size was mildly dilated.   5. Normal appearing mechanical 27 mm St Jude AVR no PVL. The mitral valve  has been repaired/replaced. No evidence of mitral valve regurgitation.  There is a 27 mm mechanical valve present in the mitral position.   6. Tricuspid valve regurgitation is moderate to severe.   7. 12.27.22 post TAVR with 23 mm Sapien 3 valve previous PVL gone mean  gradient 10.6 mm peak 20.9 mm AVA 2.6 cm2 DVI 0.71 . The aortic valve has  been repaired/replaced. Aortic valve regurgitation is not visualized.  There is a 23 mm Sapien prosthetic  (TAVR) valve present in the aortic position.   8. Aortic dilatation noted. There is moderate dilatation of the ascending  aorta,  measuring 42 mm.   EKG:  EKG is ordered today.  The ekg ordered today demonstrates ***  Recent Labs: 10/11/2021:  B Natriuretic Peptide 2,466.3; TSH 4.347 10/14/2021: Magnesium 1.8 10/16/2021: ALT 48 10/17/2021: BUN 12; Creatinine, Ser 0.76; Hemoglobin 9.9; Platelets 85; Potassium 3.0; Sodium 130  Recent Lipid Panel    Component Value Date/Time   CHOL  02/02/2008 0435    175        ATP III CLASSIFICATION:  <200     mg/dL   Desirable  200-239  mg/dL   Borderline High  >=240    mg/dL   High   TRIG 123 02/02/2008 0435   HDL 33 (L) 02/02/2008 0435   CHOLHDL 5.3 02/02/2008 0435   VLDL 25 02/02/2008 0435   LDLCALC (H) 02/02/2008 0435    117        Total Cholesterol/HDL:CHD Risk Coronary Heart Disease Risk Table                     Men   Women  1/2 Average Risk   3.4   3.3     Risk Assessment/Calculations:    CHA2DS2-VASc Score = 5  {Confirm score is correct.  If not, click here to update score.  REFRESH note.  :1} This indicates a 7.2% annual risk of stroke. The patient's score is based upon: CHF History: 1 HTN History: 1 Diabetes History: 0 Stroke History: 0 Vascular Disease History: 1 Age Score: 1 Gender Score: 1   {This patient has a significant risk of stroke if diagnosed with atrial fibrillation.  Please consider VKA or DOAC agent for anticoagulation if the bleeding risk is acceptable.   You can also use the SmartPhrase .[place] for documentation.   [id number]   Physical Exam:    VS:  There were no vitals taken for this visit.    Wt Readings from Last 3 Encounters:  10/17/21 118 lb (53.5 kg)  10/06/21 121 lb 4.1 oz (55 kg)  08/30/21 114 lb 10.2 oz (52 kg)     GEN: *** Well nourished, well developed in no acute distress HEENT: Normal NECK: No JVD LYMPHATICS: No lymphadenopathy CARDIAC: ***RRR, no murmurs, rubs, gallops RESPIRATORY:  Clear to auscultation without rales, wheezing or rhonchi  ABDOMEN: Soft, non-tender, non-distended MUSCULOSKELETAL:   No edema; No deformity  SKIN: Warm and dry NEUROLOGIC:  Alert and oriented x 3 PSYCHIATRIC:  Normal affect   ASSESSMENT:    1. S/P TAVR (transcatheter aortic valve replacement)   2. Acute on chronic combined systolic and diastolic CHF (congestive heart failure) ([place])   3. H/O mitral valve replacement with mechanical valve   4. PAF (paroxysmal atrial fibrillation) ([place])   5. Transaminitis   6. Hypokalemia   7. Thoracoabdominal aortic aneurysm (TAAA) without rupture, unspecified part    PLAN:    In order of problems listed above:  Severe AS s/p TAVR:    Acute on chronic combined systolic and diastolic CHF:    Mechanical mitral valve: Started on heparin infusion in preparation for TAVR however restarted Coumadin in the post procedure setting. Continues to have sub-therapeutic INR levels. See plan as described above in #1.     Paroxysmal atrial fibrillation: Mostly AV paced with underlying irregular rhythm. No BB due to intolerance per patient report. Restarted home digoxin. Continue Coumadin as above      Transaminitis: Suspected due to hepatic congestion in setting of decompensated heart failure as above. Right upper quadrant ultrasound showed hepatic steatosis with slightly nodular border inferiorly that may be associated with underlying cirrhosis. Last AST/ALT 121/152 with continued improvement with diuresis. Last AST/ALT at 61/48.  Hyponatremia: Appears chronic with no mental status changes. Likely secondary to diuresis.   Hypokalemia: K+ 3.0 today with diuresis. Replaced prior to discharge. Will obtain BMET at follow up in one week.    Dilated ascending aorta: measuring 4.4 cm. Will continue with routine surveillance.    Medication Adjustments/Labs and Tests Ordered: Current medicines are reviewed at length with the patient today.  Concerns regarding medicines are outlined above.  No orders of the defined types were placed in this encounter.  No orders of the defined types  were placed in this encounter.   There are no Patient Instructions on file for this visit.   Signed, Angelena Form, PA-C  10/24/2021 9:42 AM    [facility]

## 2021-10-28 NOTE — Progress Notes (Signed)
[facility]   MULTIDISCIPLINARY HEART VALVE CLINIC                                     Cardiology Office Note:    Date:  10/30/2021   ID:  [name] [name], DOB 1946-08-10, MRN [id number]  PCP:  Marylen Ponto, MD  [facility] Cardiologist:  Dr. Garner Nash in [facility] / Dr. Excell Seltzer & Dr. Laneta Simmers (TAVR)  [facility] Electrophysiologist:  None   Referring MD: Marylen Ponto, MD   [facility] s/p TAVR  History of Present Illness:    [name] [name] is a 76 y.o. 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   She had rheumatic fever at age 61 and went on to develop rheumatic heart disease. She ultimately underwent mechanical mitral valve replacement approximately 20 years ago at [facility] She has tolerated long-term warfarin therapy well without any recent bleeding problems. She underwent permanent pacemaker placement (dual-chamber) in 2009 by Dr Graciela Husbands. The patient's recent echocardiogram from July 22, 2021 demonstrates severe global LV systolic dysfunction with LVEF 30 to 35%, severe calcification and restriction of the aortic valve leaflets, mild to moderate aortic valve insufficiency, and critical aortic stenosis with a mean transvalvular gradient of 53 mmHg, peak systolic velocity of 4.5 m/s, and calculated aortic valve area less than 0.5 cm.  She actually underwent TAVR evaluation at [facility], but did not follow through with completing her work-up there.  She had CT angiography studies done earlier this year.  She appeared to have access for transfemoral TAVR. She was then evaluated at [facility] but did not want to be treated at an academic center and requested referral to [facility] Structural Heart Team. She was seen by Dr. Excell Seltzer in  the office on 08/22/21 and complained of shortness of breath and fatigue with low-level activity. She described progressive shortness of breath and generalized fatigue over the past year, now with limitation during low-level activities.  She can only walk short distances without feeling breathless.       She was then admitted 11/8-11/12/22 for influenza A and CHF. She was treated with IV lasix and Tamiflu. She had agitation and was noncompliant during this admission refusing care and being aggressive with staff. Pt refused further lasix stating she was dehydrated and home HCTZ was discontinued at discharge.      We attempted to bring her back into the office for re-evaluation with structural heart, but patient no showed and then ultimately refused an office visit saying she wanted to save money and wanted to just proceed with CT scans and appt with Dr. Laneta Simmers. These were set up for 10/01/21 but, unfortunately, the patient was too volume overloaded to lay for scanning and she was directly admitted to the hospital for IV diuresis. Once able to lye flat CT scans were completed 10/02/21. L/RHC on 10/03/21 showed patent coronary arteries with diffuse nonobstructive CAD, continued patency of the mid LAD stent. She was ultimately discharged home on Lovenox and brought back 12/24 for direct admission for heparin prior to TAVR. When admitted she was found to be volume overloaded and required IV diuresis. She  underwent successful TAVR with a 23 mm Edwards Sapien 3 Ultra Resilia via the TF approach on 10/14/21. Post operative echo showed EF 25-30%, normally functioning TAVR with a mean gradient of 10.6 mmHg and no PVL as well as a normal appearing mechanical MVR, mod-severe TR and moderate dilatation of the ascending aorta, measuring 42 mm. She also had transaminitis, hypokalemia and hyponatremia. This was felt to be related to acute CHF and diuresis.   Today the patient presents to clinic for follow up. Here with her  daughter in law. She is doing well. Her INR machine broke and she has not been able to check her INR and send to her PCP. No CP. SOB improved since TAVR. She has chronic LE edema (right more than left) which is chronic. No orthopnea or PND. No dizziness or syncope. No blood in stool or urine. No palpitations. She mostly complains of back pain and wonders what can be done about this.     Past Medical History:  Diagnosis Date   Anxiety    Aortic aneurysm [facility])    Atrial arrhythmia    With prior history of atrial flutter ablation   Bradycardia    s/p pacemaker implantation   CONGESTIVE HEART FAILURE 07/31/2010   Qualifier: Diagnosis of  By: Caryl Comes, MD, Remus Blake    GERD (gastroesophageal reflux disease)    Hypertension    Hypothyroidism    Long Q-T syndrome    Myocardial infarct ([place]) 11/20/2011   Stent placement for 100% blockage of LAD   PACEMAKER, PERMANENT 2009   Medtronic Adapta serial # [id number]   Rheumatic heart disease    With mitral valve replacement, tricuspid regurgitation moderate to severe, significant left atrial enlargment   S/P TAVR (transcatheter aortic valve replacement) 10/14/2021   Edwards 39mm S3U via TF approach with Dr. Burt Knack and Dr. Cyndia Bent   TRICUSPID REGURGITATION 12/23/2010   Qualifier: Diagnosis of  By: Caryl Comes, MD, Remus Blake    Unspecified essential hypertension 07/31/2010   Qualifier: Diagnosis of  By: Caryl Comes, MD, [facility], Mack Guise     Past Surgical History:  Procedure Laterality Date   APPENDECTOMY     CARDIAC ELECTROPHYSIOLOGY STUDY AND ABLATION     Stent and pacemaker placement   CHOLECYSTECTOMY     CORONARY ANGIOPLASTY WITH STENT PLACEMENT Left 09/13/2015   Dr. Gwenith Spitz at [facility] [place] stent   INTRAOPERATIVE TRANSTHORACIC ECHOCARDIOGRAM N/A 10/14/2021   Procedure: INTRAOPERATIVE TRANSTHORACIC ECHOCARDIOGRAM;  Surgeon: Sherren Mocha, MD;  Location: [place];  Service: Open Heart Surgery;  Laterality: N/A;   MITRAL VALVE  REPLACEMENT  2001   mechanical valve   OOPHORECTOMY     PACEMAKER INSERTION  11/20/2007   Dual chamber pacemaker implantation with rapid ventricular pacing   RIGHT/LEFT HEART CATH AND CORONARY ANGIOGRAPHY N/A 10/03/2021   Procedure: RIGHT/LEFT HEART CATH AND CORONARY ANGIOGRAPHY;  Surgeon: Sherren Mocha, MD;  Location: [place] [facility];  Service: Cardiovascular;  Laterality: N/A;   SALPINGECTOMY Bilateral    TONSILLECTOMY     TRANSCATHETER AORTIC VALVE REPLACEMENT, TRANSFEMORAL N/A 10/14/2021   Procedure: TRANSCATHETER AORTIC VALVE REPLACEMENT, TRANSFEMORAL;  Surgeon: Sherren Mocha, MD;  Location: [place];  Service: Open Heart Surgery;  Laterality: N/A;    Current Medications: Current Meds  Medication Sig   albuterol (VENTOLIN HFA) 108 (90 Base) MCG/ACT inhaler Inhale 2 puffs into the lungs every 6 (six) hours as needed for shortness of breath.   ALPRAZolam (XANAX) 1 MG tablet Take 0.5-1 mg by mouth 2 (  two) times daily as needed for anxiety.   atorvastatin (LIPITOR) 80 MG tablet Take 80 mg by mouth at bedtime.   baclofen (LIORESAL) 20 MG tablet Take 10 mg by mouth every 8 (eight) hours as needed for muscle spasms.   digoxin (LANOXIN) 0.125 MG tablet Take 1 tablet (125 mcg total) by mouth daily.   DILT-XR 240 MG 24 hr capsule Take 240 mg by mouth daily.   docusate sodium (COLACE) 100 MG capsule Take 1 capsule (100 mg total) by mouth 2 (two) times daily.   furosemide (LASIX) 20 MG tablet Take 40 mg by mouth 2 (two) times daily.   KLOR-CON M20 20 MEQ tablet Take 40 mEq by mouth See admin instructions. Crush and mix 40 mEq with applesauce and eat once a day   lactose free nutrition (BOOST PLUS) LIQD Take 237 mLs by mouth 2 (two) times daily.   levothyroxine (SYNTHROID) 75 MCG tablet Take 75 mcg by mouth daily before breakfast.   nitroGLYCERIN (NITROSTAT) 0.4 MG SL tablet Place 0.4 mg under the tongue every 5 (five) minutes as needed for chest pain.   oxyCODONE (OXY IR/ROXICODONE) 5 MG  immediate release tablet Take 0.5-1 tablets (2.5-5 mg total) by mouth every 4 (four) hours as needed for moderate pain.   pantoprazole (PROTONIX) 40 MG tablet Take 1 tablet (40 mg total) by mouth daily.   sacubitril-valsartan (ENTRESTO) 24-26 MG Take 1 tablet by mouth 2 (two) times daily.   warfarin (COUMADIN) 7.5 MG tablet Take 1 tablet (7.5 mg total) by mouth daily at 4 PM.   zolpidem (AMBIEN) 10 MG tablet Take 5-10 mg by mouth at bedtime.     Allergies:   Atenolol, Metoprolol succinate [metoprolol], Baclofen, and Latex   Social History   Socioeconomic History   Marital status: Single    Spouse name: Not on file   Number of children: Not on file   Years of education: Not on file   Highest education level: Not on file  Occupational History   Not on file  Tobacco Use   Smoking status: Never   Smokeless tobacco: Never  Substance and Sexual Activity   Alcohol use: Never   Drug use: Never   Sexual activity: Not on file  Other Topics Concern   Not on file  Social History Narrative   Not on file   Social Determinants of Health   Financial Resource Strain: Not on file  Food Insecurity: Not on file  Transportation Needs: Not on file  Physical Activity: Not on file  Stress: Not on file  Social Connections: Not on file     Family History: The patient's family history includes Heart attack in her paternal grandmother; Heart disease in her father; Leukemia in her mother; Stroke in her maternal grandmother and mother.  ROS:   Please see the history of present illness.    All other systems reviewed and are negative.  EKGs/Labs/Other Studies Reviewed:    The following studies were reviewed today:  TAVR OPERATIVE NOTE     Date of Procedure:                10/14/2021   Preoperative Diagnosis:      Severe Aortic Stenosis    Postoperative Diagnosis:    Same    Procedure:        Transcatheter Aortic Valve Replacement - Percutaneous Right Transfemoral Approach              Edwards Sapien 3 Ultra Resilia THV (size  23 mm, model # 9755RSL, serial # [id number])              Co-Surgeons:                        Gaye Pollack, MD and Sherren Mocha, MD     Anesthesiologist:                  Albertha Ghee, MD   Echocardiographer:              Jenkins Rouge, MD   Pre-operative Echo Findings: Severe aortic stenosis Moderate left ventricular systolic dysfunction   Post-operative Echo Findings: Trace paravalvular leak Unchanged moderate left ventricular systolic dysfunction   ______________________   Echo 10/15/21 IMPRESSIONS   1. Inferior septal and apical hypokinesis . Left ventricular ejection  fraction, by estimation, is 25 to 30%. The left ventricle has severely  decreased function. The left ventricle demonstrates regional wall motion  abnormalities (see scoring  diagram/findings for description). The left ventricular internal cavity  size was mildly dilated. There is severe asymmetric left ventricular  hypertrophy of the basal and septal segments. Left ventricular diastolic  parameters are indeterminate.   2. Pacing wires in RA/RV . Right ventricular systolic function is normal.  The right ventricular size is normal. There is moderately elevated  pulmonary artery systolic pressure.   3. Left atrial size was severely dilated.   4. Right atrial size was mildly dilated.   5. Normal appearing mechanical 27 mm St Jude AVR no PVL. The mitral valve  has been repaired/replaced. No evidence of mitral valve regurgitation.  There is a 27 mm mechanical valve present in the mitral position.   6. Tricuspid valve regurgitation is moderate to severe.   7. 12.27.22 post TAVR with 23 mm Sapien 3 valve previous PVL gone mean  gradient 10.6 mm peak 20.9 mm AVA 2.6 cm2 DVI 0.71 . The aortic valve has  been repaired/replaced. Aortic valve regurgitation is not visualized.  There is a 23 mm Sapien prosthetic  (TAVR) valve present in the aortic position.   8. Aortic  dilatation noted. There is moderate dilatation of the ascending  aorta, measuring 42 mm.    EKG:  EKG is ordered today.  The ekg ordered today demonstrates sinus with frequent multiform PVCs, HR 89  Recent Labs: 10/11/2021: B Natriuretic Peptide 2,466.3; TSH 4.347 10/14/2021: Magnesium 1.8 10/17/2021: Hemoglobin 9.9; Platelets 85 10/29/2021: ALT 14; BUN 9; Creatinine, Ser 1.03; Potassium 3.9; Sodium 140  Recent Lipid Panel    Component Value Date/Time   CHOL  02/02/2008 0435    175        ATP III CLASSIFICATION:  <200     mg/dL   Desirable  200-239  mg/dL   Borderline High  >=240    mg/dL   High   TRIG 123 02/02/2008 0435   HDL 33 (L) 02/02/2008 0435   CHOLHDL 5.3 02/02/2008 0435   VLDL 25 02/02/2008 0435   LDLCALC (H) 02/02/2008 0435    117        Total Cholesterol/HDL:CHD Risk Coronary Heart Disease Risk Table                     Men   Women  1/2 Average Risk   3.4   3.3     Risk Assessment/Calculations:    CHA2DS2-VASc Score = 5   This indicates a 7.2% annual risk of stroke. The patient's score is  based upon: CHF History: 1 HTN History: 1 Diabetes History: 0 Stroke History: 0 Vascular Disease History: 1 Age Score: 1 Gender Score: 1      Physical Exam:    VS:  BP (!) 170/68    Pulse 89    Ht 4\' 10"  (1.473 m)    Wt 111 lb 9.6 oz (50.6 kg)    SpO2 95%    BMI 23.32 kg/m     Wt Readings from Last 3 Encounters:  10/29/21 111 lb 9.6 oz (50.6 kg)  10/17/21 118 lb (53.5 kg)  10/06/21 121 lb 4.1 oz (55 kg)     GEN:  Well nourished, well developed in no acute distress HEENT: Normal NECK: No JVD LYMPHATICS: No lymphadenopathy CARDIAC: RRR, frequent ectopy, soft flow murmur. No rubs, gallops. 1 + bilateral LE edema (L>R) RESPIRATORY:  Clear to auscultation without rales, wheezing or rhonchi  ABDOMEN: Soft, non-tender, non-distended SKIN: Warm and dry  Groin sites clear without hematoma or ecchymosis NEUROLOGIC:  Alert and oriented x 3 PSYCHIATRIC:  Normal  affect   ASSESSMENT:    1. S/P TAVR (transcatheter aortic valve replacement)   2. Chronic combined systolic and diastolic heart failure ([place])   3. H/O mitral valve replacement with mechanical valve   4. PAF (paroxysmal atrial fibrillation) ([place])   5. Transaminitis   6. Hyponatremia   7. Hypokalemia   8. Thoracic aortic aneurysm without rupture, unspecified part   9. Hypertension, unspecified type    PLAN:    In order of problems listed above:  Severe AS s/p TAVR: she is doing well 1 week out from TAVR. ECG with no HAVB. Groin sites are stable. Continue on Coumadin and lovenox (still not therapeutic- INR 1.7 today). SBE prophylaxis discussed; the patient is edentulous and does not go to the dentist. I will see her back for 1 month follow up and echo.     Chronic combined systolic and diastolic CHF: appears slightly volume overloaded with LE edema. She is currently on Lasix 40mg  BID and digoxin 0.163mcg as well as Dilt XR 240mg . I discussed how CCBs are not favored when the heart is weak and offered to switch her to a BB but she has had bad experiences with BBs in the past although she is not able to tell what it was. Also she only takes the Dilt XR as needed for palpitations. Will go ahead and start her on Entresto 24-26mg  BID (30 day free card given) which will help lower her BP and augment diuresis. I will try to add a BB later.     S/p mechanical MVR: continue on Coumadin and lovenox (still not therapeutic- INR 1.7 today). PCP manages this.      Paroxysmal atrial fibrillation: in sinus today but a lot of PVCs. Continue on Coumadin and PRN dilt. Will try to change this to a BB at next visit.     Transaminitis: noted during her admission. Suspected due to hepatic congestion in setting of decompensated heart failure. Recheck CMET today    Hyponatremia: Appears chronic. Check CMET    Hypokalemia: check Cmet      Dilated ascending aorta: measuring 4.4 cm. Will continue with routine  surveillance.    HTN: BP very elevated today. Stared on Entresto. Will continue to follow   Medication Adjustments/Labs and Tests Ordered: Current medicines are reviewed at length with the patient today.  Concerns regarding medicines are outlined above.  Orders Placed This Encounter  Procedures   Comprehensive metabolic panel  Protime-INR   EKG 12-Lead   Meds ordered this encounter  Medications   sacubitril-valsartan (ENTRESTO) 24-26 MG    Sig: Take 1 tablet by mouth 2 (two) times daily.    Dispense:  60 tablet    Refill:  11    Patient Instructions  Medication Instructions:  Your physician has recommended you make the following change in your medication:  START: ENTRESTO 24-26 TWICE DAILY. Please Honor Card patient is presenting for Carmie Kanner: [id number]; Juanna CaoFX:[id number]; [id number]: [facility]; [id number]   *If you need a refill on your cardiac medications before your next appointment, please call your pharmacy*   Lab Work: TODAY: CMET, INR If you have labs (blood work) drawn today and your tests are completely normal, you will receive your results only by: [place] (if you have MyChart) OR A paper copy in the mail If you have any lab test that is abnormal or we need to change your treatment, we will call you to review the results.   Testing/Procedures: NONE   Follow-Up: KEEP SCHEDULED FOLLOW-UP   Signed, Angelena Form, PA-C  10/30/2021 9:48 AM    [facility]

## 2021-10-29 ENCOUNTER — Other Ambulatory Visit: Payer: Self-pay

## 2021-10-29 ENCOUNTER — Ambulatory Visit: Payer: Medicare Other | Admitting: Physician Assistant

## 2021-10-29 ENCOUNTER — Encounter: Payer: Self-pay | Admitting: Physician Assistant

## 2021-10-29 VITALS — BP 170/68 | HR 89 | Ht <= 58 in | Wt 111.6 lb

## 2021-10-29 DIAGNOSIS — I5042 Chronic combined systolic (congestive) and diastolic (congestive) heart failure: Secondary | ICD-10-CM | POA: Diagnosis not present

## 2021-10-29 DIAGNOSIS — R7401 Elevation of levels of liver transaminase levels: Secondary | ICD-10-CM

## 2021-10-29 DIAGNOSIS — I480 Paroxysmal atrial fibrillation: Secondary | ICD-10-CM

## 2021-10-29 DIAGNOSIS — I10 Essential (primary) hypertension: Secondary | ICD-10-CM

## 2021-10-29 DIAGNOSIS — Z952 Presence of prosthetic heart valve: Secondary | ICD-10-CM

## 2021-10-29 DIAGNOSIS — E871 Hypo-osmolality and hyponatremia: Secondary | ICD-10-CM

## 2021-10-29 DIAGNOSIS — E876 Hypokalemia: Secondary | ICD-10-CM | POA: Diagnosis not present

## 2021-10-29 DIAGNOSIS — I712 Thoracic aortic aneurysm, without rupture, unspecified: Secondary | ICD-10-CM

## 2021-10-29 LAB — PROTIME-INR
INR: 1.7 — ABNORMAL HIGH (ref 0.9–1.2)
Prothrombin Time: 16.5 s — ABNORMAL HIGH (ref 9.1–12.0)

## 2021-10-29 MED ORDER — ENTRESTO 24-26 MG PO TABS
1.0000 | ORAL_TABLET | Freq: Two times a day (BID) | ORAL | 11 refills | Status: DC
Start: 2021-10-29 — End: 2021-12-10

## 2021-10-29 NOTE — Patient Instructions (Addendum)
Medication Instructions:  Your physician has recommended you make the following change in your medication:  START: ENTRESTO 24-26 TWICE DAILY. Please Honor Card patient is presenting for Carmie Kanner: [id number]; Juanna CaoOA:[id number]; [id number]: [facility]; [id number]   *If you need a refill on your cardiac medications before your next appointment, please call your pharmacy*   Lab Work: TODAY: CMET, INR If you have labs (blood work) drawn today and your tests are completely normal, you will receive your results only by: [place] (if you have MyChart) OR A paper copy in the mail If you have any lab test that is abnormal or we need to change your treatment, we will call you to review the results.   Testing/Procedures: NONE   Follow-Up: KEEP SCHEDULED FOLLOW-UP

## 2021-10-30 LAB — COMPREHENSIVE METABOLIC PANEL
ALT: 14 IU/L (ref 0–32)
AST: 25 IU/L (ref 0–40)
Albumin/Globulin Ratio: 1.5 (ref 1.2–2.2)
Albumin: 4.2 g/dL (ref 3.7–4.7)
Alkaline Phosphatase: 101 IU/L (ref 44–121)
BUN/Creatinine Ratio: 9 — ABNORMAL LOW (ref 12–28)
BUN: 9 mg/dL (ref 8–27)
Bilirubin Total: 1 mg/dL (ref 0.0–1.2)
CO2: 25 mmol/L (ref 20–29)
Calcium: 9 mg/dL (ref 8.7–10.3)
Chloride: 99 mmol/L (ref 96–106)
Creatinine, Ser: 1.03 mg/dL — ABNORMAL HIGH (ref 0.57–1.00)
Globulin, Total: 2.8 g/dL (ref 1.5–4.5)
Glucose: 94 mg/dL (ref 70–99)
Potassium: 3.9 mmol/L (ref 3.5–5.2)
Sodium: 140 mmol/L (ref 134–144)
Total Protein: 7 g/dL (ref 6.0–8.5)
eGFR: 57 mL/min/{1.73_m2} — ABNORMAL LOW (ref >59)

## 2021-11-04 DIAGNOSIS — Z7901 Long term (current) use of anticoagulants: Secondary | ICD-10-CM | POA: Diagnosis not present

## 2021-11-05 ENCOUNTER — Encounter: Payer: Medicare Other | Admitting: Surgery

## 2021-11-05 DIAGNOSIS — Z9181 History of falling: Secondary | ICD-10-CM | POA: Diagnosis not present

## 2021-11-05 DIAGNOSIS — Z6823 Body mass index (BMI) 23.0-23.9, adult: Secondary | ICD-10-CM | POA: Diagnosis not present

## 2021-11-05 DIAGNOSIS — R300 Dysuria: Secondary | ICD-10-CM | POA: Diagnosis not present

## 2021-11-05 DIAGNOSIS — Z952 Presence of prosthetic heart valve: Secondary | ICD-10-CM | POA: Diagnosis not present

## 2021-11-05 DIAGNOSIS — Z0000 Encounter for general adult medical examination without abnormal findings: Secondary | ICD-10-CM | POA: Diagnosis not present

## 2021-11-06 DIAGNOSIS — I5020 Unspecified systolic (congestive) heart failure: Secondary | ICD-10-CM | POA: Diagnosis not present

## 2021-11-13 NOTE — Progress Notes (Deleted)
HEART AND VASCULAR CENTER   MULTIDISCIPLINARY HEART VALVE CLINIC                                     Cardiology Office Note:    Date:  11/13/2021   ID:  [name] [name], DOB January 03, 1946, MRN [id number]  PCP:  Marylen Ponto, MD  [facility] Cardiologist:  Dr. Garner Nash in [facility] / Dr. Excell Seltzer & Dr. Laneta Simmers (TAVR)  [facility] Electrophysiologist:  None   Referring MD: Marylen Ponto, MD   1 month s/p TAVR  History of Present Illness:    [name] [name] is a 76 y.o. 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   She had rheumatic fever at age 69 and went on to develop rheumatic heart disease. She ultimately underwent mechanical mitral valve replacement approximately 20 years ago at [facility]. She has tolerated long-term warfarin therapy well without any recent bleeding problems. She underwent permanent pacemaker placement (dual-chamber) in 2009 by Dr Graciela Husbands. The patient's recent echocardiogram from July 22, 2021 demonstrates severe global LV systolic dysfunction with LVEF 30 to 35%, severe calcification and restriction of the aortic valve leaflets, mild to moderate aortic valve insufficiency, and critical aortic stenosis with a mean transvalvular gradient of 53 mmHg, peak systolic velocity of 4.5 m/s, and calculated aortic valve area less than 0.5 cm.  She actually underwent TAVR evaluation at [facility], but did not follow through with completing her work-up there.  She had CT angiography studies done earlier this year.  She appeared to have access for transfemoral TAVR. She was then evaluated at [facility] but did not want to be treated at an academic center and requested referral to [facility] Structural Heart Team. She was seen by Dr. Excell Seltzer  in the office on 08/22/21 and complained of shortness of breath and fatigue with low-level activity. She described progressive shortness of breath and generalized fatigue over the past year, now with limitation during low-level activities.  She can only walk short distances without feeling breathless.       She was then admitted 11/8-11/12/22 for influenza A and CHF. She was treated with IV lasix and Tamiflu. She had agitation and was noncompliant during this admission refusing care and being aggressive with staff. Pt refused further lasix stating she was dehydrated and home HCTZ was discontinued at discharge.      We attempted to bring her back into the office for re-evaluation with structural heart, but patient no showed and then ultimately refused an office visit saying she wanted to save money and wanted to just proceed with CT scans and appt with Dr. Laneta Simmers. These were set up for 10/01/21 but, unfortunately, the patient was too volume overloaded to lay for scanning and she was directly admitted to the hospital for IV diuresis. Once able to lye flat CT scans were completed 10/02/21. L/RHC on 10/03/21 showed patent coronary arteries with diffuse nonobstructive CAD, continued patency of the mid LAD stent. She was ultimately discharged home on Lovenox and brought back 12/24 for direct admission for heparin prior to TAVR. When admitted she was found to be volume overloaded and required IV diuresis.  She underwent successful TAVR with a 23 mm Edwards Sapien 3 Ultra Resilia via the TF approach on 10/14/21. Post operative echo showed EF 25-30%, normally functioning TAVR with a mean gradient of 10.6 mmHg and no PVL as well as a normal appearing mechanical MVR, mod-severe TR and moderate dilatation of the ascending aorta, measuring 42 mm. She also had transaminitis, hypokalemia and hyponatremia. This was felt to be related to acute CHF and diuresis.   She was seen for post hospital follow up on 10/30/21 and started on  Entresto given cardiomyopathy, HTN and mild volume overload.   Today the patient presents to clinic for follow up. Here with her daughter in law.     Past Medical History:  Diagnosis Date   Anxiety    Aortic aneurysm [facility])    Atrial arrhythmia    With prior history of atrial flutter ablation   Bradycardia    s/p pacemaker implantation   CONGESTIVE HEART FAILURE 07/31/2010   Qualifier: Diagnosis of  By: Graciela Husbands, MD, Susie Cassette    GERD (gastroesophageal reflux disease)    Hypertension    Hypothyroidism    Long Q-T syndrome    Myocardial infarct (HCC) 11/20/2011   Stent placement for 100% blockage of LAD   PACEMAKER, PERMANENT 2009   Medtronic Adapta serial # [id number] H   Rheumatic heart disease    With mitral valve replacement, tricuspid regurgitation moderate to severe, significant left atrial enlargment   S/P TAVR (transcatheter aortic valve replacement) 10/14/2021   Edwards 23mm S3U via TF approach with Dr. Excell Seltzer and Dr. Laneta Simmers   TRICUSPID REGURGITATION 12/23/2010   Qualifier: Diagnosis of  By: Graciela Husbands, MD, Susie Cassette    Unspecified essential hypertension 07/31/2010   Qualifier: Diagnosis of  By: Graciela Husbands, MD, [facility], Ty Hilts     Past Surgical History:  Procedure Laterality Date   APPENDECTOMY     CARDIAC ELECTROPHYSIOLOGY STUDY AND ABLATION     Stent and pacemaker placement   CHOLECYSTECTOMY     CORONARY ANGIOPLASTY WITH STENT PLACEMENT Left 09/13/2015   Dr. Josefa Half at [facility], LAD stent   INTRAOPERATIVE TRANSTHORACIC ECHOCARDIOGRAM N/A 10/14/2021   Procedure: INTRAOPERATIVE TRANSTHORACIC ECHOCARDIOGRAM;  Surgeon: Tonny Bollman, MD;  Location: [facility] OR;  Service: Open Heart Surgery;  Laterality: N/A;   MITRAL VALVE REPLACEMENT  2001   mechanical valve   OOPHORECTOMY     PACEMAKER INSERTION  11/20/2007   Dual chamber pacemaker implantation with rapid ventricular pacing   RIGHT/LEFT HEART CATH AND CORONARY ANGIOGRAPHY N/A 10/03/2021   Procedure:  RIGHT/LEFT HEART CATH AND CORONARY ANGIOGRAPHY;  Surgeon: Tonny Bollman, MD;  Location: [facility] INVASIVE CV LAB;  Service: Cardiovascular;  Laterality: N/A;   SALPINGECTOMY Bilateral    TONSILLECTOMY     TRANSCATHETER AORTIC VALVE REPLACEMENT, TRANSFEMORAL N/A 10/14/2021   Procedure: TRANSCATHETER AORTIC VALVE REPLACEMENT, TRANSFEMORAL;  Surgeon: Tonny Bollman, MD;  Location: [facility] OR;  Service: Open Heart Surgery;  Laterality: N/A;    Current Medications: No outpatient medications have been marked as taking for the 11/14/21 encounter (Appointment) with Janetta Hora, PA-C.     Allergies:   Atenolol, Metoprolol succinate [metoprolol], Baclofen, and Latex   Social History   Socioeconomic History   Marital status: Single    Spouse name: Not on file   Number of children: Not on file   Years of education: Not on file   Highest education level: Not on file  Occupational History   Not on file  Tobacco Use   Smoking  status: Never   Smokeless tobacco: Never  Substance and Sexual Activity   Alcohol use: Never   Drug use: Never   Sexual activity: Not on file  Other Topics Concern   Not on file  Social History Narrative   Not on file   Social Determinants of Health   Financial Resource Strain: Not on file  Food Insecurity: Not on file  Transportation Needs: Not on file  Physical Activity: Not on file  Stress: Not on file  Social Connections: Not on file     Family History: The patient's family history includes Heart attack in her paternal grandmother; Heart disease in her father; Leukemia in her mother; Stroke in her maternal grandmother and mother.  ROS:   Please see the history of present illness.    All other systems reviewed and are negative.  EKGs/Labs/Other Studies Reviewed:    The following studies were reviewed today:  TAVR OPERATIVE NOTE     Date of Procedure:                10/14/2021   Preoperative Diagnosis:      Severe Aortic Stenosis    Postoperative  Diagnosis:    Same    Procedure:        Transcatheter Aortic Valve Replacement - Percutaneous Right Transfemoral Approach             Edwards Sapien 3 Ultra Resilia THV (size 23 mm, model # 9755RSL, serial # [id number])              Co-Surgeons:                        Alleen Borne, MD and Tonny Bollman, MD     Anesthesiologist:                  Achille Rich, MD   Echocardiographer:              Charlton Haws, MD   Pre-operative Echo Findings: Severe aortic stenosis Moderate left ventricular systolic dysfunction   Post-operative Echo Findings: Trace paravalvular leak Unchanged moderate left ventricular systolic dysfunction   ______________________   Echo 10/15/21 IMPRESSIONS   1. Inferior septal and apical hypokinesis . Left ventricular ejection  fraction, by estimation, is 25 to 30%. The left ventricle has severely  decreased function. The left ventricle demonstrates regional wall motion  abnormalities (see scoring  diagram/findings for description). The left ventricular internal cavity  size was mildly dilated. There is severe asymmetric left ventricular  hypertrophy of the basal and septal segments. Left ventricular diastolic  parameters are indeterminate.   2. Pacing wires in RA/RV . Right ventricular systolic function is normal.  The right ventricular size is normal. There is moderately elevated  pulmonary artery systolic pressure.   3. Left atrial size was severely dilated.   4. Right atrial size was mildly dilated.   5. Normal appearing mechanical 27 mm St Jude AVR no PVL. The mitral valve  has been repaired/replaced. No evidence of mitral valve regurgitation.  There is a 27 mm mechanical valve present in the mitral position.   6. Tricuspid valve regurgitation is moderate to severe.   7. 12.27.22 post TAVR with 23 mm Sapien 3 valve previous PVL gone mean  gradient 10.6 mm peak 20.9 mm AVA 2.6 cm2 DVI 0.71 . The aortic valve has  been repaired/replaced. Aortic valve  regurgitation is not visualized.  There is a 23 mm Sapien prosthetic  (  TAVR) valve present in the aortic position.   8. Aortic dilatation noted. There is moderate dilatation of the ascending  aorta, measuring 42 mm.    _____________________________  Echo 11/14/21 ***  EKG:  EKG is NOT ordered today.    Recent Labs: 10/11/2021: B Natriuretic Peptide 2,466.3; TSH 4.347 10/14/2021: Magnesium 1.8 10/17/2021: Hemoglobin 9.9; Platelets 85 10/29/2021: ALT 14; BUN 9; Creatinine, Ser 1.03; Potassium 3.9; Sodium 140  Recent Lipid Panel    Component Value Date/Time   CHOL  02/02/2008 0435    175        ATP III CLASSIFICATION:  <200     mg/dL   Desirable  478-295  mg/dL   Borderline High  >=621    mg/dL   High   TRIG 308 65/78/4696 0435   HDL 33 (L) 02/02/2008 0435   CHOLHDL 5.3 02/02/2008 0435   VLDL 25 02/02/2008 0435   LDLCALC (H) 02/02/2008 0435    117        Total Cholesterol/HDL:CHD Risk Coronary Heart Disease Risk Table                     Men   Women  1/2 Average Risk   3.4   3.3     Risk Assessment/Calculations:    CHA2DS2-VASc Score = 5   This indicates a 7.2% annual risk of stroke. The patient's score is based upon: CHF History: 1 HTN History: 1 Diabetes History: 0 Stroke History: 0 Vascular Disease History: 1 Age Score: 1 Gender Score: 1   {This patient has a significant risk of stroke if diagnosed with atrial fibrillation.  Please consider VKA or DOAC agent for anticoagulation if the bleeding risk is acceptable.   You can also use the SmartPhrase .[email or web address] for documentation.   :[id number]}   Physical Exam:    VS:  There were no vitals taken for this visit.    Wt Readings from Last 3 Encounters:  10/29/21 111 lb 9.6 oz (50.6 kg)  10/17/21 118 lb (53.5 kg)  10/06/21 121 lb 4.1 oz (55 kg)     GEN:  Well nourished, well developed in no acute distress HEENT: Normal NECK: No JVD LYMPHATICS: No lymphadenopathy CARDIAC: RRR, frequent ectopy, soft flow  murmur. No rubs, gallops. 1 + bilateral LE edema (L>R) RESPIRATORY:  Clear to auscultation without rales, wheezing or rhonchi  ABDOMEN: Soft, non-tender, non-distended SKIN: Warm and dry   NEUROLOGIC:  Alert and oriented x 3 PSYCHIATRIC:  Normal affect   ASSESSMENT:    1. S/P TAVR (transcatheter aortic valve replacement)   2. Chronic combined systolic and diastolic heart failure (HCC)   3. H/O mitral valve replacement with mechanical valve   4. PAF (paroxysmal atrial fibrillation) (HCC)   5. Transaminitis   6. Hyponatremia   7. Hypokalemia   8. Thoracic aortic aneurysm without rupture, unspecified part   9. Hypertension, unspecified type     PLAN:    In order of problems listed above:  Severe AS s/p TAVR: sContinue on Coumadin. SBE prophylaxis discussed; the patient is edentulous and does not go to the dentist. I will see her back for 1 year follow up and echo.     Chronic combined systolic and diastolic CHF:  She is currently on Lasix  BID and digoxin 0.172mcg as well as Dilt XR . I discussed how CCBs are not favored when the heart is weak and offered to switch her to a BB but she has had bad experiences with  BBs in the past although she is not able to tell what it was. Also she only takes the Dilt XR as needed for palpitations. Will go ahead and start her on Entresto 24-26mg  BID (30 day free card given) which will help lower her BP and augment diuresis. I will try to add a BB later.     S/p mechanical MVR: continue on Coumadin     Paroxysmal atrial fibrillation: in sinus today but a lot of PVCs. Continue on Coumadin and PRN dilt. Will try to change this to a BB at next visit.     Transaminitis: noted during her admission. Suspected due to hepatic congestion in setting of decompensated heart failure. This resolved on repeat labs.     Hyponatremia: resolved    Hypokalemia: resovled    Dilated ascending aorta: measuring 4.4 cm. Will continue with routine surveillance.     HTN:   Medication Adjustments/Labs and Tests Ordered: Current medicines are reviewed at length with the patient today.  Concerns regarding medicines are outlined above.  No orders of the defined types were placed in this encounter.  No orders of the defined types were placed in this encounter.   There are no Patient Instructions on file for this visit.   Signed, [name] Crock, PA-C  11/13/2021 2:00 PM    [facility]

## 2021-11-14 ENCOUNTER — Ambulatory Visit: Payer: Medicare Other | Admitting: Physician Assistant

## 2021-11-14 ENCOUNTER — Ambulatory Visit (HOSPITAL_COMMUNITY): Payer: Medicare Other | Attending: Cardiovascular Disease

## 2021-11-14 DIAGNOSIS — I712 Thoracic aortic aneurysm, without rupture, unspecified: Secondary | ICD-10-CM

## 2021-11-14 DIAGNOSIS — E876 Hypokalemia: Secondary | ICD-10-CM

## 2021-11-14 DIAGNOSIS — R7401 Elevation of levels of liver transaminase levels: Secondary | ICD-10-CM

## 2021-11-14 DIAGNOSIS — I5042 Chronic combined systolic (congestive) and diastolic (congestive) heart failure: Secondary | ICD-10-CM

## 2021-11-14 DIAGNOSIS — I10 Essential (primary) hypertension: Secondary | ICD-10-CM

## 2021-11-14 DIAGNOSIS — Z952 Presence of prosthetic heart valve: Secondary | ICD-10-CM

## 2021-11-14 DIAGNOSIS — E871 Hypo-osmolality and hyponatremia: Secondary | ICD-10-CM

## 2021-11-14 DIAGNOSIS — I480 Paroxysmal atrial fibrillation: Secondary | ICD-10-CM

## 2021-11-27 ENCOUNTER — Telehealth: Payer: Self-pay | Admitting: Physician Assistant

## 2021-11-27 ENCOUNTER — Other Ambulatory Visit: Payer: Self-pay | Admitting: Physician Assistant

## 2021-11-27 MED ORDER — PANTOPRAZOLE SODIUM 40 MG PO TBEC: 40.0000 mg | DELAYED_RELEASE_TABLET | Freq: Every day | ORAL | 1 refills | Status: DC

## 2021-11-27 NOTE — Telephone Encounter (Signed)
°  HEART AND VASCULAR CENTER   MULTIDISCIPLINARY HEART VALVE TEAM  Patient was seen for post hospital follow up after TAVR on 1/11 and doing well. She was started on Entresto at that time. She was scheduled for 1 month follow up with echo and labs on 11/14/21 for which she no showed. Several attempts on multiple days have been made to call her and r/s her apts. We have left at least 5 messages. She answered one time and then hung up. She finally answered and said she has some people in her house " that she doesn't trust."   She doesn't seem to be tolerating the Entresto well but just started it yesterday. Wants a refill on digoxin. I told her I cannot refill any of her Rx's until I see her in person.   Given her high risk for no show I decided to ask the [facility] while on the phone with her. She seems to be doing okay from a cardiac standpoint but has multiple other complaints.   She has NYHA class II symptoms.   [facility] Cardiomyopathy Questionnaire  KCCQ-12 11/27/2021 08/22/2021  1 a. Ability to shower/bathe Not at all limited Not at all limited  1 b. Ability to walk 1 block Moderately limited Moderately limited  1 c. Ability to hurry/jog Other, Did not do Extremely limited  2. Edema feet/ankles/legs Never over the past 2 weeks 3+ times a week, not every day  3. Limited by fatigue 3+ times per week, not every day 1-2 times a week  4. Limited by dyspnea 1-2 times a week Several times a day  5. Sitting up / on 3+ pillows Never over the past 2 weeks Never over the past 2 weeks  6. Limited enjoyment of life Slightly limited Moderately limited  7. Rest of life w/ symptoms Mostly satisfied Somewhat satisfied  8 a. Participation in hobbies Did not limit at all Limited quite a bit  8 b. Participation in chores Did not limit at all Moderately limited  8 c. Visiting family/friends Did not limit at all Moderately limited    I did refill her Rx for pantoprazole for her. I spent about 15 minutes on the  phone with her. She did agree to an appt and echo on 2/22. Hopefully, we can get her echo and see her in person. She is seeing her PCP today due to an INR of 8. We did go over ER precautions.    Cline Crock PA-C  [facility]

## 2021-12-01 DIAGNOSIS — Z7901 Long term (current) use of anticoagulants: Secondary | ICD-10-CM | POA: Diagnosis not present

## 2021-12-01 DIAGNOSIS — Z79899 Other long term (current) drug therapy: Secondary | ICD-10-CM | POA: Diagnosis not present

## 2021-12-07 DIAGNOSIS — I5020 Unspecified systolic (congestive) heart failure: Secondary | ICD-10-CM | POA: Diagnosis not present

## 2021-12-08 NOTE — Progress Notes (Addendum)
[facility] [place]                                     Cardiology Office Note:    Date:  12/11/2021   ID:  [name] [name], DOB October 17, 1946, MRN [id number]  PCP:  Ronita Hipps, MD  [facility] Cardiologist:  Dr. Olena Heckle in [place]--> wants to switch to an [place] MD--> Dr. Bettina Gavia / Dr. Burt Knack & Dr. Cyndia Bent (TAVR)  [facility]Care Electrophysiologist:  None   Referring MD: Ronita Hipps, MD   1 month s/p TAVR   History of Present Illness:    [name] [name] is a 76 y.o. 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   She had rheumatic fever at age 24 and went on to develop rheumatic heart disease. She ultimately underwent mechanical mitral valve replacement approximately 20 years ago at [facility]. She has tolerated long-term warfarin therapy well without any recent bleeding problems. She underwent permanent pacemaker placement (dual-chamber) in 2009 by Dr Caryl Comes. The patient's recent echocardiogram from July 22, 2021 demonstrates severe global LV systolic dysfunction with LVEF 30 to 35%, severe calcification and restriction of the aortic valve leaflets, mild to moderate aortic valve insufficiency, and critical aortic stenosis with a mean transvalvular gradient of 53 mmHg, peak systolic velocity of 4.5 m/s, and calculated aortic valve area less than 0.5 cm.  She actually underwent TAVR evaluation at [facility], but did not follow through with completing her work-up there.  She had CT angiography studies done earlier this year.  She appeared to have access for transfemoral TAVR. She was then evaluated at [facility] but did not want to be treated at an academic center and requested referral to [place] [facility]. She was seen by Dr. Burt Knack in the office on 08/22/21 and complained of shortness of breath and fatigue with low-level activity. She described progressive shortness of breath and generalized fatigue over the past year, now with limitation during low-level activities.  She can only walk short distances without feeling breathless.       She was then admitted 11/8-11/12/22 for influenza A and CHF. She was treated with IV lasix and Tamiflu. She had agitation and was noncompliant during this admission refusing care and being aggressive with staff. Pt refused further lasix stating she was dehydrated and home HCTZ was discontinued at discharge.      We attempted to bring her back into the office for re-evaluation with structural heart, but patient no showed and then ultimately refused an office visit saying she wanted to save money and wanted to just proceed with CT scans and appt with Dr. Cyndia Bent. These were set up for 10/01/21 but, unfortunately, the patient was too volume overloaded to lay for scanning and she was directly admitted to the hospital for IV diuresis. Once able to lye flat CT scans were completed 10/02/21. L/RHC on 10/03/21 showed patent coronary arteries with diffuse nonobstructive CAD, continued patency of the mid LAD stent. She was ultimately discharged home on Lovenox and brought back 12/24 for direct admission for heparin prior to TAVR. When admitted she  was found to be volume overloaded and required IV diuresis. She underwent successful TAVR with a 23 mm Edwards Sapien 3 Ultra Resilia via the TF approach on 10/14/21. Post operative echo showed EF 25-30%, normally functioning TAVR with a mean gradient of 10.6 mmHg and no PVL as well as a normal appearing mechanical MVR, mod-severe TR and moderate dilatation of the ascending aorta, measuring 42 mm. She also had transaminitis, hypokalemia and hyponatremia. This was felt to be related to acute CHF and diuresis.    She was  seen for post hospital follow up on 10/30/21 and started on Entresto given cardiomyopathy, HTN and mild volume overload.   Today the patient presents to clinic for follow up. She did not tolerate entresto due to dizziness and dry eyes. Chest pain has resolved since TAVR. Does have a lot of swelling in legs, esp when on feet all day. No dyspnea, orthopnea or PND. Her biggest limitation is her back. No chest pain.     Past Medical History:  Diagnosis Date   Anxiety    Aortic aneurysm [facility])    Atrial arrhythmia    With prior history of atrial flutter ablation   Bradycardia    s/p pacemaker implantation   CONGESTIVE HEART FAILURE 07/31/2010   Qualifier: Diagnosis of  By: Caryl Comes, MD, Remus Blake    GERD (gastroesophageal reflux disease)    Hypertension    Hypothyroidism    Long Q-T syndrome    Myocardial infarct ([place]) 11/20/2011   Stent placement for 100% blockage of LAD   PACEMAKER, PERMANENT 2009   Medtronic Adapta serial # [id number]   Rheumatic heart disease    With mitral valve replacement, tricuspid regurgitation moderate to severe, significant left atrial enlargment   S/P TAVR (transcatheter aortic valve replacement) 10/14/2021   Edwards 33mm S3U via TF approach with Dr. Burt Knack and Dr. Cyndia Bent   TRICUSPID REGURGITATION 12/23/2010   Qualifier: Diagnosis of  By: Caryl Comes, MD, Remus Blake    Unspecified essential hypertension 07/31/2010   Qualifier: Diagnosis of  By: Caryl Comes, MD, [facility], Mack Guise     Past Surgical History:  Procedure Laterality Date   APPENDECTOMY     CARDIAC ELECTROPHYSIOLOGY STUDY AND ABLATION     Stent and pacemaker placement   CHOLECYSTECTOMY     CORONARY ANGIOPLASTY WITH STENT PLACEMENT Left 09/13/2015   Dr. Gwenith Spitz at [facility], [place] stent   INTRAOPERATIVE TRANSTHORACIC ECHOCARDIOGRAM N/A 10/14/2021   Procedure: INTRAOPERATIVE TRANSTHORACIC ECHOCARDIOGRAM;  Surgeon: Sherren Mocha, MD;  Location: [place];  Service: Open Heart Surgery;   Laterality: N/A;   MITRAL VALVE REPLACEMENT  2001   mechanical valve   OOPHORECTOMY     PACEMAKER INSERTION  11/20/2007   Dual chamber pacemaker implantation with rapid ventricular pacing   RIGHT/LEFT HEART CATH AND CORONARY ANGIOGRAPHY N/A 10/03/2021   Procedure: RIGHT/LEFT HEART CATH AND CORONARY ANGIOGRAPHY;  Surgeon: Sherren Mocha, MD;  Location: [place] CV [facility];  Service: Cardiovascular;  Laterality: N/A;   SALPINGECTOMY Bilateral    TONSILLECTOMY     TRANSCATHETER AORTIC VALVE REPLACEMENT, TRANSFEMORAL N/A 10/14/2021   Procedure: TRANSCATHETER AORTIC VALVE REPLACEMENT, TRANSFEMORAL;  Surgeon: Sherren Mocha, MD;  Location: [place];  Service: Open Heart Surgery;  Laterality: N/A;    Current Medications: Current Meds  Medication Sig   albuterol (VENTOLIN HFA) 108 (90 Base) MCG/ACT inhaler Inhale 2 puffs into the lungs every 6 (six) hours as needed for shortness of breath.   ALPRAZolam (XANAX) 1 MG tablet Take 0.5-1 mg  by mouth 2 (two) times daily as needed for anxiety.   atorvastatin (LIPITOR) 80 MG tablet Take 80 mg by mouth at bedtime.   baclofen (LIORESAL) 20 MG tablet Take 10 mg by mouth every 8 (eight) hours as needed for muscle spasms.   DILT-XR 240 MG 24 hr capsule Take 240 mg by mouth 2 (two) times daily.   docusate sodium (COLACE) 100 MG capsule Take 1 capsule (100 mg total) by mouth 2 (two) times daily.   enoxaparin (LOVENOX) 60 MG/0.6ML injection Inject 0.6 mLs (60 mg total) into the skin every 12 (twelve) hours for 3 days.   furosemide (LASIX) 40 MG tablet Take 1 tablet (40 mg total) by mouth 2 (two) times daily.   hydrochlorothiazide (HYDRODIURIL) 25 MG tablet Take 25 mg by mouth daily.   lactose free nutrition (BOOST PLUS) LIQD Take 237 mLs by mouth 2 (two) times daily.   levothyroxine (SYNTHROID) 75 MCG tablet Take 75 mcg by mouth daily before breakfast.   nitroGLYCERIN (NITROSTAT) 0.4 MG SL tablet Place 0.4 mg under the tongue every 5 (five) minutes as needed for  chest pain.   oxyCODONE (OXY IR/ROXICODONE) 5 MG immediate release tablet Take 0.5-1 tablets (2.5-5 mg total) by mouth every 4 (four) hours as needed for moderate pain.   pantoprazole (PROTONIX) 40 MG tablet Take 1 tablet (40 mg total) by mouth daily.   warfarin (COUMADIN) 7.5 MG tablet Take 1 tablet (7.5 mg total) by mouth daily at 4 PM.   zolpidem (AMBIEN) 10 MG tablet Take 5-10 mg by mouth at bedtime.   [DISCONTINUED] digoxin (LANOXIN) 0.125 MG tablet Take 1 tablet (125 mcg total) by mouth daily.   [DISCONTINUED] furosemide (LASIX) 20 MG tablet Take 40 mg by mouth 2 (two) times daily.   [DISCONTINUED] KLOR-CON M20 20 MEQ tablet Take 40 mEq by mouth See admin instructions. Crush and mix 40 mEq with applesauce and eat once a day     Allergies:   Atenolol, Metoprolol succinate [metoprolol], Baclofen, and Latex   Social History   Socioeconomic History   Marital status: Single    Spouse name: Not on file   Number of children: Not on file   Years of education: Not on file   Highest education level: Not on file  Occupational History   Not on file  Tobacco Use   Smoking status: Never   Smokeless tobacco: Never  Substance and Sexual Activity   Alcohol use: Never   Drug use: Never   Sexual activity: Not on file  Other Topics Concern   Not on file  Social History Narrative   Not on file   Social Determinants of Health   Financial Resource Strain: Not on file  Food Insecurity: Not on file  Transportation Needs: Not on file  Physical Activity: Not on file  Stress: Not on file  Social Connections: Not on file     Family History: The patient's family history includes Heart attack in her paternal grandmother; Heart disease in her father; Leukemia in her mother; Stroke in her maternal grandmother and mother.  ROS:   Please see the history of present illness.    All other systems reviewed and are negative.  EKGs/Labs/Other Studies Reviewed:    The following studies were reviewed  today:  TAVR OPERATIVE NOTE     Date of Procedure:                10/14/2021   Preoperative Diagnosis:      Severe Aortic  Stenosis    Postoperative Diagnosis:    Same    Procedure:        Transcatheter Aortic Valve Replacement - Percutaneous Right Transfemoral Approach             Edwards Sapien 3 Ultra Resilia THV (size 23 mm, model # 9755RSL, serial # [id number])              Co-Surgeons:                        Gaye Pollack, MD and Sherren Mocha, MD     Anesthesiologist:                  Albertha Ghee, MD   Echocardiographer:              Jenkins Rouge, MD   Pre-operative Echo Findings: Severe aortic stenosis Moderate left ventricular systolic dysfunction   Post-operative Echo Findings: Trace paravalvular leak Unchanged moderate left ventricular systolic dysfunction   ______________________   Echo 10/15/21 IMPRESSIONS   1. Inferior septal and apical hypokinesis . Left ventricular ejection  fraction, by estimation, is 25 to 30%. The left ventricle has severely  decreased function. The left ventricle demonstrates regional wall motion  abnormalities (see scoring  diagram/findings for description). The left ventricular internal cavity  size was mildly dilated. There is severe asymmetric left ventricular  hypertrophy of the basal and septal segments. Left ventricular diastolic  parameters are indeterminate.   2. Pacing wires in RA/RV . Right ventricular systolic function is normal.  The right ventricular size is normal. There is moderately elevated  pulmonary artery systolic pressure.   3. Left atrial size was severely dilated.   4. Right atrial size was mildly dilated.   5. Normal appearing mechanical 27 mm St Jude AVR no PVL. The mitral valve  has been repaired/replaced. No evidence of mitral valve regurgitation.  There is a 27 mm mechanical valve present in the mitral position.   6. Tricuspid valve regurgitation is moderate to severe.   7. 12.27.22 post TAVR with  23 mm Sapien 3 valve previous PVL gone mean  gradient 10.6 mm peak 20.9 mm AVA 2.6 cm2 DVI 0.71 . The aortic valve has  been repaired/replaced. Aortic valve regurgitation is not visualized.  There is a 23 mm Sapien prosthetic  (TAVR) valve present in the aortic position.   8. Aortic dilatation noted. There is moderate dilatation of the ascending  aorta, measuring 42 mm.   _________________________  Echo 12/10/21 IMPRESSIONS  1. Left ventricular ejection fraction, by estimation, is 50 to 55%. The left ventricle has low normal function. The left ventricle demonstrates regional wall motion abnormalities (see scoring diagram/findings for description). There is severe asymmetric  left ventricular hypertrophy of the basal-septal segment. Left ventricular diastolic function could not be evaluated. Elevated left ventricular end-diastolic pressure. There is dyskinesis of the left ventricular, mid-apical septal wall.  2. Right ventricular systolic function is normal. The right ventricular size is moderately enlarged. There is moderately elevated pulmonary artery systolic pressure. The estimated right ventricular systolic pressure is 99991111 mmHg.  3. Left atrial size was severely dilated.  4. Right atrial size was moderately dilated.  5. The mitral valve has been repaired/replaced. No evidence of mitral valve regurgitation. There is a 27 mm St. Jude mechanical valve present in the mitral position.  6. Tricuspid valve regurgitation is severe.  7. The aortic valve has been repaired/replaced. Aortic valve regurgitation is not visualized.  There is a 23 mm Sapien prosthetic (TAVR) valve present in the aortic position. Procedure Date: 10/14/2021. Aortic valve mean gradient measures 9.6 mmHg.  8. Aortic dilatation noted. There is mild dilatation of the ascending aorta, measuring 40 mm.  9. The inferior vena cava is dilated in size with <50% respiratory variability, suggesting right atrial pressure of 15 mmHg.    Comparison(s): Prior images reviewed side by side. Changes from prior study are noted.  EKG:  EKG is NOT ordered today.  Recent Labs: 10/11/2021: B Natriuretic Peptide 2,466.3; TSH 4.347 10/14/2021: Magnesium 1.8 10/17/2021: Hemoglobin 9.9; Platelets 85 10/29/2021: ALT 14 12/10/2021: BUN 13; Creatinine, Ser 0.91; Potassium 3.2; Sodium 142  Recent Lipid Panel    Component Value Date/Time   CHOL  02/02/2008 0435    175        ATP III CLASSIFICATION:  <200     mg/dL   Desirable  200-239  mg/dL   Borderline High  >=240    mg/dL   High   TRIG 123 02/02/2008 0435   HDL 33 (L) 02/02/2008 0435   CHOLHDL 5.3 02/02/2008 0435   VLDL 25 02/02/2008 0435   LDLCALC (H) 02/02/2008 0435    117        Total Cholesterol/HDL:CHD Risk Coronary Heart Disease Risk Table                     Men   Women  1/2 Average Risk   3.4   3.3     Risk Assessment/Calculations:    CHA2DS2-VASc Score = 5   This indicates a 7.2% annual risk of stroke. The patient's score is based upon: CHF History: 1 HTN History: 1 Diabetes History: 0 Stroke History: 0 Vascular Disease History: 1 Age Score: 1 Gender Score: 1      Physical Exam:    VS:  BP 112/60 (BP Location: Left Arm, Patient Position: Sitting, Cuff Size: Normal)    Pulse 92    Ht 4\' 10"  (1.473 m)    Wt 103 lb (46.7 kg)    SpO2 95%    BMI 21.53 kg/m     Wt Readings from Last 3 Encounters:  12/10/21 103 lb (46.7 kg)  10/29/21 111 lb 9.6 oz (50.6 kg)  10/17/21 118 lb (53.5 kg)     GEN:  Well nourished, well developed in no acute distress HEENT: Normal NECK: No JVD LYMPHATICS: No lymphadenopathy CARDIAC: RRR, frequent ectopy, soft flow murmur. No rubs, gallops. 1 + bilateral LE edema (R>L) RESPIRATORY:  Clear to auscultation without rales, wheezing or rhonchi  ABDOMEN: Soft, non-tender, non-distended SKIN: Warm and dry   NEUROLOGIC:  Alert and oriented x 3 PSYCHIATRIC:  Normal affect   ASSESSMENT:    1. S/P TAVR (transcatheter aortic  valve replacement)   2. Chronic combined systolic and diastolic heart failure ([place])   3. H/O mitral valve replacement with mechanical valve   4. PAF (paroxysmal atrial fibrillation) ([place])   5. Transaminitis   6. Hyponatremia   7. Hypokalemia   8. Thoracic aortic aneurysm without rupture, unspecified part   9. Hypertension, unspecified type     PLAN:    In order of problems listed above:   Severe AS s/p TAVR: echo today shows EF 50-55%, normally functioning TAVR with a mean gradient of 9.6 mm hg and no PVL as well as severe TR.  She has NYHA class I symptoms. Continue on Coumadin. SBE prophylaxis discussed; the patient is edentulous and does not go  to the dentist. I will see her back for 1 year follow up and echo.     Chronic combined systolic and diastolic CHF: EF now low normal on echo today. She appears euvolemic aside from LE edema, which is chronic. She is currently on Lasix 40mg  BID and digoxin 0.144mcg as well as Dilt XR 240mg . I discussed how CCBs are not favored when the heart is weak and offered to switch her to a BB but she has had bad experiences with BBs in the past although she is not able to tell what it was. She did not tolerate Entresto. She also started taking HCTZ again. I am hesitant to stop lasix of hctz with LE edema. Will recheck BMET today     S/p mechanical MVR: continue on Coumadin     Paroxysmal atrial fibrillation: in sinus today but a lot of PVCs. Continue on Coumadin and PRN dilt. She is resistant to taking a BB    Transaminitis: noted during her admission. Suspected due to hepatic congestion in setting of decompensated heart failure. This resolved on repeat labs.     Hyponatremia: resolved on most recent labs. BMET today    Hypokalemia: resolved on most recent labs. BMET today    Dilated ascending aorta: measuring 4.4 cm. Will continue with routine surveillance.     HTN: BP well controlled today. No changes made.     Medication Adjustments/Labs and  Tests Ordered: Current medicines are reviewed at length with the patient today.  Concerns regarding medicines are outlined above.  Orders Placed This Encounter  Procedures   Basic metabolic panel   Meds ordered this encounter  Medications   furosemide (LASIX) 40 MG tablet    Sig: Take 1 tablet (40 mg total) by mouth 2 (two) times daily.    Dispense:  180 tablet    Refill:  3   digoxin (LANOXIN) 0.125 MG tablet    Sig: Take 1 tablet (125 mcg total) by mouth daily.    Dispense:  30 tablet    Refill:  11    Patient Instructions  Medication Instructions:  Your physician has recommended you make the following change in your medication:   STOP Entresto  *If you need a refill on your cardiac medications before your next appointment, please call your pharmacy*   Lab Work: TODAY:  BMET  If you have labs (blood work) drawn today and your tests are completely normal, you will receive your results only by: [place] (if you have MyChart) OR A paper copy in the mail If you have any lab test that is abnormal or we need to change your treatment, we will call you to review the results.   Testing/Procedures: None ordered   Follow-Up: At [facility], you and your health needs are our priority.  As part of our continuing mission to provide you with exceptional heart care, we have created designated Provider Care Teams.  These Care Teams include your primary Cardiologist (physician) and Advanced Practice Providers (APPs -  Physician Assistants and Nurse Practitioners) who all work together to provide you with the care you need, when you need it.  We recommend signing up for the patient portal called "MyChart".  Sign up information is provided on this After Visit Summary.  MyChart is used to connect with patients for Virtual Visits (Telemedicine).  Patients are able to view lab/test results, encounter notes, upcoming appointments, etc.  Non-urgent messages can be sent to your provider  as well.   To  learn more about what you can do with MyChart, go to [email or web address].    Your next appointment:   4 month(s)  The format for your next appointment:   In Person  Provider:   Shirlee More, MD    Other Instructions     Signed, Angelena Form, PA-C  12/11/2021 10:43 AM    [place]

## 2021-12-10 ENCOUNTER — Ambulatory Visit (HOSPITAL_COMMUNITY): Payer: Medicare Other | Attending: Cardiology

## 2021-12-10 ENCOUNTER — Other Ambulatory Visit: Payer: Self-pay

## 2021-12-10 ENCOUNTER — Ambulatory Visit: Payer: Medicare Other | Admitting: Physician Assistant

## 2021-12-10 VITALS — BP 112/60 | HR 92 | Ht <= 58 in | Wt 103.0 lb

## 2021-12-10 DIAGNOSIS — I10 Essential (primary) hypertension: Secondary | ICD-10-CM

## 2021-12-10 DIAGNOSIS — I5042 Chronic combined systolic (congestive) and diastolic (congestive) heart failure: Secondary | ICD-10-CM | POA: Diagnosis not present

## 2021-12-10 DIAGNOSIS — E871 Hypo-osmolality and hyponatremia: Secondary | ICD-10-CM | POA: Diagnosis not present

## 2021-12-10 DIAGNOSIS — Z952 Presence of prosthetic heart valve: Secondary | ICD-10-CM | POA: Diagnosis not present

## 2021-12-10 DIAGNOSIS — I350 Nonrheumatic aortic (valve) stenosis: Secondary | ICD-10-CM | POA: Diagnosis not present

## 2021-12-10 DIAGNOSIS — I712 Thoracic aortic aneurysm, without rupture, unspecified: Secondary | ICD-10-CM | POA: Diagnosis not present

## 2021-12-10 DIAGNOSIS — R7401 Elevation of levels of liver transaminase levels: Secondary | ICD-10-CM | POA: Diagnosis not present

## 2021-12-10 DIAGNOSIS — E876 Hypokalemia: Secondary | ICD-10-CM

## 2021-12-10 DIAGNOSIS — I480 Paroxysmal atrial fibrillation: Secondary | ICD-10-CM

## 2021-12-10 MED ORDER — DIGOXIN 125 MCG PO TABS: 125.0000 ug | ORAL_TABLET | Freq: Every day | ORAL | 11 refills | Status: DC

## 2021-12-10 MED ORDER — FUROSEMIDE 40 MG PO TABS: 40.0000 mg | ORAL_TABLET | Freq: Two times a day (BID) | ORAL | 3 refills | Status: DC

## 2021-12-10 NOTE — Patient Instructions (Signed)
Medication Instructions:  Your physician has recommended you make the following change in your medication:   STOP Entresto  *If you need a refill on your cardiac medications before your next appointment, please call your pharmacy*   Lab Work: TODAY:  BMET  If you have labs (blood work) drawn today and your tests are completely normal, you will receive your results only by: MyChart Message (if you have MyChart) OR A paper copy in the mail If you have any lab test that is abnormal or we need to change your treatment, we will call you to review the results.   Testing/Procedures: None ordered   Follow-Up: At [facility], you and your health needs are our priority.  As part of our continuing mission to provide you with exceptional heart care, we have created designated Provider Care Teams.  These Care Teams include your primary Cardiologist (physician) and Advanced Practice Providers (APPs -  Physician Assistants and Nurse Practitioners) who all work together to provide you with the care you need, when you need it.  We recommend signing up for the patient portal called "MyChart".  Sign up information is provided on this After Visit Summary.  MyChart is used to connect with patients for Virtual Visits (Telemedicine).  Patients are able to view lab/test results, encounter notes, upcoming appointments, etc.  Non-urgent messages can be sent to your provider as well.   To learn more about what you can do with MyChart, go to [email or web address].    Your next appointment:   4 month(s)  The format for your next appointment:   In Person  Provider:   Norman Herrlich, MD    Other Instructions

## 2021-12-11 ENCOUNTER — Other Ambulatory Visit: Payer: Self-pay | Admitting: Physician Assistant

## 2021-12-11 LAB — ECHOCARDIOGRAM COMPLETE
AV Mean grad: 9.6 mmHg
AV Peak grad: 16.6 mmHg
Ao pk vel: 2.04 m/s
Area-P 1/2: 1.3 cm2
Height: 58 in
S' Lateral: 3.1 cm
Weight: 1648 oz

## 2021-12-11 LAB — BASIC METABOLIC PANEL
BUN/Creatinine Ratio: 14 (ref 12–28)
BUN: 13 mg/dL (ref 8–27)
CO2: 29 mmol/L (ref 20–29)
Calcium: 9.1 mg/dL (ref 8.7–10.3)
Chloride: 95 mmol/L — ABNORMAL LOW (ref 96–106)
Creatinine, Ser: 0.91 mg/dL (ref 0.57–1.00)
Glucose: 91 mg/dL (ref 70–99)
Potassium: 3.2 mmol/L — ABNORMAL LOW (ref 3.5–5.2)
Sodium: 142 mmol/L (ref 134–144)
eGFR: 66 mL/min/{1.73_m2} (ref >59)

## 2021-12-11 MED ORDER — KLOR-CON M20 20 MEQ PO TBCR: 60.0000 meq | EXTENDED_RELEASE_TABLET | ORAL | 1 refills | Status: DC

## 2021-12-30 DIAGNOSIS — Z7901 Long term (current) use of anticoagulants: Secondary | ICD-10-CM | POA: Diagnosis not present

## 2021-12-30 DIAGNOSIS — Z952 Presence of prosthetic heart valve: Secondary | ICD-10-CM | POA: Diagnosis not present

## 2022-01-04 DIAGNOSIS — I5020 Unspecified systolic (congestive) heart failure: Secondary | ICD-10-CM | POA: Diagnosis not present

## 2022-01-08 DIAGNOSIS — G4700 Insomnia, unspecified: Secondary | ICD-10-CM | POA: Diagnosis not present

## 2022-01-08 DIAGNOSIS — E039 Hypothyroidism, unspecified: Secondary | ICD-10-CM | POA: Diagnosis not present

## 2022-01-08 DIAGNOSIS — Z952 Presence of prosthetic heart valve: Secondary | ICD-10-CM | POA: Diagnosis not present

## 2022-01-08 DIAGNOSIS — Z7901 Long term (current) use of anticoagulants: Secondary | ICD-10-CM | POA: Diagnosis not present

## 2022-01-08 DIAGNOSIS — I10 Essential (primary) hypertension: Secondary | ICD-10-CM | POA: Diagnosis not present

## 2022-01-08 DIAGNOSIS — Z6822 Body mass index (BMI) 22.0-22.9, adult: Secondary | ICD-10-CM | POA: Diagnosis not present

## 2022-01-14 DIAGNOSIS — Z7901 Long term (current) use of anticoagulants: Secondary | ICD-10-CM | POA: Diagnosis not present

## 2022-01-14 DIAGNOSIS — Z952 Presence of prosthetic heart valve: Secondary | ICD-10-CM | POA: Diagnosis not present

## 2022-01-14 DIAGNOSIS — Z6824 Body mass index (BMI) 24.0-24.9, adult: Secondary | ICD-10-CM | POA: Diagnosis not present

## 2022-01-14 DIAGNOSIS — E876 Hypokalemia: Secondary | ICD-10-CM | POA: Diagnosis not present

## 2022-01-21 DIAGNOSIS — Z7901 Long term (current) use of anticoagulants: Secondary | ICD-10-CM | POA: Diagnosis not present

## 2022-01-30 DIAGNOSIS — E876 Hypokalemia: Secondary | ICD-10-CM | POA: Diagnosis not present

## 2022-02-04 DIAGNOSIS — J019 Acute sinusitis, unspecified: Secondary | ICD-10-CM | POA: Diagnosis not present

## 2022-02-04 DIAGNOSIS — Z6823 Body mass index (BMI) 23.0-23.9, adult: Secondary | ICD-10-CM | POA: Diagnosis not present

## 2022-02-04 DIAGNOSIS — E876 Hypokalemia: Secondary | ICD-10-CM | POA: Diagnosis not present

## 2022-02-04 DIAGNOSIS — I5020 Unspecified systolic (congestive) heart failure: Secondary | ICD-10-CM | POA: Diagnosis not present

## 2022-02-22 DIAGNOSIS — Z952 Presence of prosthetic heart valve: Secondary | ICD-10-CM | POA: Diagnosis not present

## 2022-02-22 DIAGNOSIS — Z7901 Long term (current) use of anticoagulants: Secondary | ICD-10-CM | POA: Diagnosis not present

## 2022-02-23 DIAGNOSIS — J0120 Acute ethmoidal sinusitis, unspecified: Secondary | ICD-10-CM | POA: Diagnosis not present

## 2022-03-06 DIAGNOSIS — I5020 Unspecified systolic (congestive) heart failure: Secondary | ICD-10-CM | POA: Diagnosis not present

## 2022-04-06 DIAGNOSIS — I5020 Unspecified systolic (congestive) heart failure: Secondary | ICD-10-CM | POA: Diagnosis not present

## 2022-04-09 ENCOUNTER — Ambulatory Visit: Payer: Medicare Other | Admitting: Cardiology

## 2022-04-27 ENCOUNTER — Other Ambulatory Visit: Payer: Self-pay | Admitting: Physician Assistant

## 2022-04-28 NOTE — Telephone Encounter (Signed)
Pt' pharmacy is requesting a refill on pantoprazole. Would Dr. Excell Seltzer like to refill this medication? Please address

## 2022-04-30 NOTE — Telephone Encounter (Signed)
Pt is scheduled to see Dr Dulce Sellar in [place] office on 7/31 to be established with a primary cardiologist. Will refill enough to last until this appt. (Pt seen as structural TAVR pt only)

## 2022-05-03 DIAGNOSIS — Z952 Presence of prosthetic heart valve: Secondary | ICD-10-CM | POA: Diagnosis not present

## 2022-05-03 DIAGNOSIS — Z7901 Long term (current) use of anticoagulants: Secondary | ICD-10-CM | POA: Diagnosis not present

## 2022-05-05 DIAGNOSIS — Z6824 Body mass index (BMI) 24.0-24.9, adult: Secondary | ICD-10-CM | POA: Diagnosis not present

## 2022-05-05 DIAGNOSIS — Z7901 Long term (current) use of anticoagulants: Secondary | ICD-10-CM | POA: Diagnosis not present

## 2022-05-05 DIAGNOSIS — N3001 Acute cystitis with hematuria: Secondary | ICD-10-CM | POA: Diagnosis not present

## 2022-05-05 DIAGNOSIS — Z952 Presence of prosthetic heart valve: Secondary | ICD-10-CM | POA: Diagnosis not present

## 2022-05-06 DIAGNOSIS — I5020 Unspecified systolic (congestive) heart failure: Secondary | ICD-10-CM | POA: Diagnosis not present

## 2022-05-12 ENCOUNTER — Other Ambulatory Visit: Payer: Self-pay | Admitting: Physician Assistant

## 2022-05-12 DIAGNOSIS — Z952 Presence of prosthetic heart valve: Secondary | ICD-10-CM

## 2022-05-17 NOTE — Progress Notes (Unsigned)
Cardiology Office Note:    Date:  05/18/2022   ID:  [name] [name], DOB Jul 21, 1946, MRN [id number]  PCP:  [name] Ponto, MD  Cardiologist:  [name] Herrlich, MD    Referring MD: [name] Ponto, MD    ASSESSMENT:    1. S/P TAVR (transcatheter aortic valve replacement)   2. Chronic combined systolic and diastolic heart failure (HCC)   3. H/O mitral valve replacement with mechanical valve   4. Chronic anticoagulation   5. PAF (paroxysmal atrial fibrillation) (HCC)   6. Presence of permanent cardiac pacemaker   7. S/P MVR (mitral valve replacement)    PLAN:    In order of problems listed above:  Is doing very well since TAVR no recurrence of heart failure normalization of ventricular function and presently New York Heart Association class I. Heart failure is compensated she is taking both proximal as well as loop diuretics.  Hydrochlorothiazide should help with her chronic hypokalemia Continue warfarin goal INR 3.0 managed by her PCP Stable she takes calcium channel blocker as needed. 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  Concerns regarding medicines are outlined above.  No orders of the defined types were placed in this encounter.  No orders of the defined types were placed in this encounter.   Chief Complaint  Patient presents with   Establish Care   Congestive Heart Failure    Elevated    History of Present Illness:    [name] [name] is a 76 y.o. 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 Post operative echo showed EF 25-30%, normally functioning TAVR with a mean gradient of 10.6 mmHg and no PVL as well as a normal appearing mechanical MVR, mod-severe TR and moderate dilatation of the ascending aorta, measuring 42 mm.  Follow-up echocardiogram in February showed normal left ventricular function.  Compliance with diet, lifestyle and medications: Yes  She feels she is recovering from her cardiac interventions is back to full activities including gardening work and only limitation is back pain Previously her pacemaker is followed at [facility] group which she said has been years since he has been checked and we will get her into our device clinic at the first opportunity She does home INRs to go to her family doctor and has had no bleeding complication she was 2.5 recently INR She has no complaints of edema shortness of breath chest pain or palpitation Past Medical History:  Diagnosis Date   Anxiety    Aortic aneurysm [facility])    Atrial arrhythmia    With prior history of atrial flutter ablation   Bradycardia    s/p pacemaker implantation   CONGESTIVE HEART FAILURE 07/31/2010   Qualifier: Diagnosis of  By: [name] Husbands, MD, [name] [name]    GERD (gastroesophageal reflux disease)    Hypertension    Hypothyroidism    Long Q-T syndrome    Myocardial infarct (HCC) 11/20/2011   Stent placement for 100% blockage of  LAD   PACEMAKER, PERMANENT 2009   Medtronic Adapta serial # [id number]   Rheumatic heart disease    With mitral valve replacement, tricuspid regurgitation moderate to severe, significant left atrial enlargment   S/P TAVR (transcatheter aortic valve replacement) 10/14/2021   Edwards 15mm S3U via TF approach with Dr. Excell [name] and Dr. Laneta [name]   TRICUSPID REGURGITATION 12/23/2010   Qualifier: Diagnosis of  By: [name] Husbands, MD, [name] [name]    Unspecified essential hypertension  07/31/2010   Qualifier: Diagnosis of  By: [name] Husbands, MD, [facility], Ty Hilts     Past Surgical History:  Procedure Laterality Date   APPENDECTOMY     CARDIAC ELECTROPHYSIOLOGY STUDY AND ABLATION     Stent and pacemaker placement   CHOLECYSTECTOMY     CORONARY ANGIOPLASTY WITH STENT PLACEMENT Left 09/13/2015   Dr. Josefa [name] at [facility], LAD stent   INTRAOPERATIVE TRANSTHORACIC ECHOCARDIOGRAM N/A 10/14/2021   Procedure: INTRAOPERATIVE TRANSTHORACIC ECHOCARDIOGRAM;  Surgeon: [name] Bollman, MD;  Location[facility] OR;  Service: Open Heart Surgery;  Laterality: N/A;   MITRAL VALVE REPLACEMENT  2001   mechanical valve   OOPHORECTOMY     PACEMAKER INSERTION  11/20/2007   Dual chamber pacemaker implantation with rapid ventricular pacing   RIGHT/LEFT HEART CATH AND CORONARY ANGIOGRAPHY N/A 10/03/2021   Procedure: RIGHT/LEFT HEART CATH AND CORONARY ANGIOGRAPHY;  Surgeon: [name] Bollman, MD;  Location: [facility] INVASIVE CV LAB;  Service: Cardiovascular;  Laterality: N/A;   SALPINGECTOMY Bilateral    TONSILLECTOMY     TRANSCATHETER AORTIC VALVE REPLACEMENT, TRANSFEMORAL N/A 10/14/2021   Procedure: TRANSCATHETER AORTIC VALVE REPLACEMENT, TRANSFEMORAL;  Surgeon: [name] Bollman, MD;  Location: [facility] OR;  Service: Open Heart Surgery;  Laterality: N/A;    Current Medications: Current Meds  Medication Sig   albuterol (VENTOLIN HFA) 108 (90 Base) MCG/ACT inhaler Inhale 2 puffs into the lungs every 6 (six) hours as needed for shortness of breath.   ALPRAZolam (XANAX) 1 MG tablet Take 0.5-1 mg by mouth 2 (two) times daily as needed for anxiety.   atorvastatin (LIPITOR) 80 MG tablet Take 80 mg by mouth at bedtime.   DILT-XR 240 MG 24 hr capsule Take 240 mg by mouth 2 (two) times daily.   diltiazem (DILACOR XR) 240 MG 24 hr capsule Take 240 mg by mouth daily as needed (tachycardia).   docusate sodium (COLACE) 100 MG capsule Take 1 capsule (100 mg total) by mouth 2 (two) times daily.   hydrochlorothiazide  (HYDRODIURIL) 25 MG tablet Take 25 mg by mouth daily.   KLOR-CON M20 20 MEQ tablet Take 3 tablets (60 mEq total) by mouth See admin instructions. Crush and mix 60 mEq with applesauce and eat once a day   levothyroxine (SYNTHROID) 75 MCG tablet Take 75 mcg by mouth daily before breakfast.   nitroGLYCERIN (NITROSTAT) 0.4 MG SL tablet Place 0.4 mg under the tongue every 5 (five) minutes as needed for chest pain.   pantoprazole (PROTONIX) 40 MG tablet TAKE 1 TABLET BY MOUTH EVERY DAY   warfarin (COUMADIN) 7.5 MG tablet Take 1 tablet (7.5 mg total) by mouth daily at 4 PM.   zolpidem (AMBIEN) 10 MG tablet Take 5-10 mg by mouth at bedtime.   [DISCONTINUED] furosemide (LASIX) 40 MG tablet Take 40 mg by mouth daily.     Allergies:   Atenolol, Metoprolol succinate [metoprolol], Baclofen, and Latex   Social History   Socioeconomic History   Marital status: Single    Spouse name: Not on file   Number of children: Not  on file   Years of education: Not on file   Highest education level: Not on file  Occupational History   Not on file  Tobacco Use   Smoking status: Never   Smokeless tobacco: Never  Substance and Sexual Activity   Alcohol use: Never   Drug use: Never   Sexual activity: Not on file  Other Topics Concern   Not on file  Social History Narrative   Not on file   Social Determinants of Health   Financial Resource Strain: Not on file  Food Insecurity: Not on file  Transportation Needs: Not on file  Physical Activity: Not on file  Stress: Not on file  Social Connections: Not on file     Family History: The patient's family history includes Heart attack in her paternal grandmother; Heart disease in her father; Leukemia in her mother; Stroke in her maternal grandmother and mother. ROS:   Please see the history of present illness.    All other systems reviewed and are negative.  EKGs/Labs/Other Studies Reviewed:    The following studies were reviewed today:   TAVR  OPERATIVE NOTE     Date of Procedure:                10/14/2021   Preoperative Diagnosis:      Severe Aortic Stenosis    Postoperative Diagnosis:    Same    Procedure:        Transcatheter Aortic Valve Replacement - Percutaneous Right Transfemoral Approach             Edwards Sapien 3 Ultra Resilia THV (size 23 mm, model # 9755RSL, serial # [id number])              Co-Surgeons:                        Alleen Borne, MD and [name] Bollman, MD     Anesthesiologist:                  Achille Rich, MD   Echocardiographer:              Charlton Haws, MD   Pre-operative Echo Findings: Severe aortic stenosis Moderate left ventricular systolic dysfunction   Post-operative Echo Findings: Trace paravalvular leak Unchanged moderate left ventricular systolic dysfunction   ______________________     Echo 10/15/21 IMPRESSIONS   1. Inferior septal and apical hypokinesis . Left ventricular ejection  fraction, by estimation, is 25 to 30%. The left ventricle has severely  decreased function. The left ventricle demonstrates regional wall motion  abnormalities (see scoring  diagram/findings for description). The left ventricular internal cavity  size was mildly dilated. There is severe asymmetric left ventricular  hypertrophy of the basal and septal segments. Left ventricular diastolic  parameters are indeterminate.   2. Pacing wires in RA/RV . Right ventricular systolic function is normal.  The right ventricular size is normal. There is moderately elevated  pulmonary artery systolic pressure.   3. Left atrial size was severely dilated.   4. Right atrial size was mildly dilated.   5. Normal appearing mechanical 27 mm St Jude AVR no PVL. The mitral valve  has been repaired/replaced. No evidence of mitral valve regurgitation.  There is a 27 mm mechanical valve present in the mitral position.   6. Tricuspid valve regurgitation is moderate to severe.   7. 12.27.22 post TAVR with 23 mm Sapien 3  valve previous PVL gone mean  gradient 10.6 mm peak 20.9 mm AVA 2.6 cm2 DVI 0.71 . The aortic valve has  been repaired/replaced. Aortic valve regurgitation is not visualized.  There is a 23 mm Sapien prosthetic  (TAVR) valve present in the aortic position.   8. Aortic dilatation noted. There is moderate dilatation of the ascending  aorta, measuring 42 mm.    _________________________   Echo 12/10/21 IMPRESSIONS  1. Left ventricular ejection fraction, by estimation, is 50 to 55%. The left ventricle has low normal function. The left ventricle demonstrates regional wall motion abnormalities (see scoring diagram/findings for description). There is severe asymmetric  left ventricular hypertrophy of the basal-septal segment. Left ventricular diastolic function could not be evaluated. Elevated left ventricular end-diastolic pressure. There is dyskinesis of the left ventricular, mid-apical septal wall.  2. Right ventricular systolic function is normal. The right ventricular size is moderately enlarged. There is moderately elevated pulmonary artery systolic pressure. The estimated right ventricular systolic pressure is 56.2 mmHg.  3. Left atrial size was severely dilated.  4. Right atrial size was moderately dilated.  5. The mitral valve has been repaired/replaced. No evidence of mitral valve regurgitation. There is a 27 mm St. Jude mechanical valve present in the mitral position.  6. Tricuspid valve regurgitation is severe.  7. The aortic valve has been repaired/replaced. Aortic valve regurgitation is not visualized. There is a 23 mm Sapien prosthetic (TAVR) valve present in the aortic position. Procedure Date: 10/14/2021. Aortic valve mean gradient measures 9.6 mmHg.  8. Aortic dilatation noted. There is mild dilatation of the ascending aorta, measuring 40 mm.  9. The inferior vena cava is dilated in size with <50% respiratory variability, suggesting right atrial pressure of 15 mmHg.    Recent  Labs: 10/11/2021: B Natriuretic Peptide 2,466.3; TSH 4.347 10/14/2021: Magnesium 1.8 10/17/2021: Hemoglobin 9.9; Platelets 85 10/29/2021: ALT 14 12/10/2021: BUN 13; Creatinine, Ser 0.91; Potassium 3.2; Sodium 142  Recent Lipid Panel    Component Value Date/Time   CHOL  02/02/2008 0435    175        ATP III CLASSIFICATION:  <200     mg/dL   Desirable  828-003  mg/dL   Borderline High  >=491    mg/dL   High   TRIG 791 50/56/9794 0435   HDL 33 (L) 02/02/2008 0435   CHOLHDL 5.3 02/02/2008 0435   VLDL 25 02/02/2008 0435   LDLCALC (H) 02/02/2008 0435    117        Total Cholesterol/HDL:CHD Risk Coronary Heart Disease Risk Table                     Men   Women  1/2 Average Risk   3.4   3.3    Physical Exam:    VS:  BP 140/76 (BP Location: Left Arm, Patient Position: Sitting, Cuff Size: Normal)   Pulse 68   Ht 4\' 10"  (1.473 m)   Wt 113 lb (51.3 kg)   SpO2 94%   BMI 23.62 kg/m     Wt Readings from Last 3 Encounters:  05/18/22 113 lb (51.3 kg)  12/10/21 103 lb (46.7 kg)  10/29/21 111 lb 9.6 oz (50.6 kg)     GEN: Small stature well nourished, well developed in no acute distress HEENT: Normal NECK: No JVD; No carotid bruits LYMPHATICS: No lymphadenopathy CARDIAC: Sharp closing sound Saint Jude mitral valve prosthesis no murmur either from her TAVR for her mitral valve RRR RESPIRATORY:  Clear to auscultation without rales,  wheezing or rhonchi  ABDOMEN: Soft, non-tender, non-distended MUSCULOSKELETAL:  No edema; No deformity  SKIN: Warm and dry NEUROLOGIC:  Alert and oriented x 3 PSYCHIATRIC:  Normal affect    Signed, [name] Herrlich, MD  05/18/2022 1:38 PM    [facility]

## 2022-05-18 ENCOUNTER — Encounter: Payer: Self-pay | Admitting: Cardiology

## 2022-05-18 ENCOUNTER — Ambulatory Visit: Payer: Medicare Other | Admitting: Cardiology

## 2022-05-18 VITALS — BP 140/76 | HR 68 | Ht <= 58 in | Wt 113.0 lb

## 2022-05-18 DIAGNOSIS — I5042 Chronic combined systolic (congestive) and diastolic (congestive) heart failure: Secondary | ICD-10-CM

## 2022-05-18 DIAGNOSIS — I480 Paroxysmal atrial fibrillation: Secondary | ICD-10-CM | POA: Diagnosis not present

## 2022-05-18 DIAGNOSIS — Z952 Presence of prosthetic heart valve: Secondary | ICD-10-CM

## 2022-05-18 DIAGNOSIS — Z7901 Long term (current) use of anticoagulants: Secondary | ICD-10-CM | POA: Diagnosis not present

## 2022-05-18 DIAGNOSIS — Z950 Presence of cardiac pacemaker: Secondary | ICD-10-CM

## 2022-05-18 MED ORDER — NITROGLYCERIN 0.4 MG SL SUBL: 0.4000 mg | SUBLINGUAL_TABLET | SUBLINGUAL | 3 refills | Status: AC | PRN

## 2022-05-18 NOTE — Patient Instructions (Signed)
Medication Instructions:  Your physician has recommended you make the following change in your medication:   STOP: Hydrochlorothiazide  *If you need a refill on your cardiac medications before your next appointment, please call your pharmacy*   Lab Work: None If you have labs (blood work) drawn today and your tests are completely normal, you will receive your results only by: MyChart Message (if you have MyChart) OR A paper copy in the mail If you have any lab test that is abnormal or we need to change your treatment, we will call you to review the results.   Testing/Procedures: Your physician has requested that you have an echocardiogram. Echocardiography is a painless test that uses sound waves to create images of your heart. It provides your doctor with information about the size and shape of your heart and how well your heart's chambers and valves are working. This procedure takes approximately one hour. There are no restrictions for this procedure.    Follow-Up: At [facility], you and your health needs are our priority.  As part of our continuing mission to provide you with exceptional heart care, we have created designated Provider Care Teams.  These Care Teams include your primary Cardiologist (physician) and Advanced Practice Providers (APPs -  Physician Assistants and Nurse Practitioners) who all work together to provide you with the care you need, when you need it.  We recommend signing up for the patient portal called "MyChart".  Sign up information is provided on this After Visit Summary.  MyChart is used to connect with patients for Virtual Visits (Telemedicine).  Patients are able to view lab/test results, encounter notes, upcoming appointments, etc.  Non-urgent messages can be sent to your provider as well.   To learn more about what you can do with MyChart, go to [email or web address].    Your next appointment:   6 month(s)  The format for your next appointment:    In Person  Provider:   Norman Herrlich, MD    Other Instructions None  Important Information About Sugar

## 2022-05-21 DIAGNOSIS — G4700 Insomnia, unspecified: Secondary | ICD-10-CM | POA: Diagnosis not present

## 2022-05-21 DIAGNOSIS — Z6824 Body mass index (BMI) 24.0-24.9, adult: Secondary | ICD-10-CM | POA: Diagnosis not present

## 2022-05-28 ENCOUNTER — Other Ambulatory Visit: Payer: Self-pay | Admitting: Physician Assistant

## 2022-05-28 NOTE — Telephone Encounter (Signed)
This is Dr. Earmon Phoenix pt. Would Dr. Excell Seltzer like to refill medication pantoprazole? Please address

## 2022-06-06 DIAGNOSIS — I5020 Unspecified systolic (congestive) heart failure: Secondary | ICD-10-CM | POA: Diagnosis not present

## 2022-06-19 ENCOUNTER — Other Ambulatory Visit: Payer: Medicare Other

## 2022-06-30 ENCOUNTER — Other Ambulatory Visit: Payer: Medicare Other

## 2022-06-30 DIAGNOSIS — Z7901 Long term (current) use of anticoagulants: Secondary | ICD-10-CM | POA: Diagnosis not present

## 2022-06-30 DIAGNOSIS — Z952 Presence of prosthetic heart valve: Secondary | ICD-10-CM | POA: Diagnosis not present

## 2022-07-07 DIAGNOSIS — I5020 Unspecified systolic (congestive) heart failure: Secondary | ICD-10-CM | POA: Diagnosis not present

## 2022-07-08 DIAGNOSIS — R0781 Pleurodynia: Secondary | ICD-10-CM | POA: Diagnosis not present

## 2022-07-08 DIAGNOSIS — E876 Hypokalemia: Secondary | ICD-10-CM | POA: Diagnosis not present

## 2022-07-08 DIAGNOSIS — M546 Pain in thoracic spine: Secondary | ICD-10-CM | POA: Diagnosis not present

## 2022-07-08 DIAGNOSIS — Z6824 Body mass index (BMI) 24.0-24.9, adult: Secondary | ICD-10-CM | POA: Diagnosis not present

## 2022-07-20 ENCOUNTER — Ambulatory Visit: Payer: Medicare Other | Attending: Cardiology | Admitting: Cardiology

## 2022-07-21 ENCOUNTER — Encounter: Payer: Self-pay | Admitting: Cardiology

## 2022-08-06 DIAGNOSIS — I5020 Unspecified systolic (congestive) heart failure: Secondary | ICD-10-CM | POA: Diagnosis not present

## 2022-08-11 ENCOUNTER — Inpatient Hospital Stay (HOSPITAL_COMMUNITY)
Admission: EM | Admit: 2022-08-11 | Discharge: 2022-08-18 | DRG: 522 | Disposition: A | Discharge status: Skilled Nursing Facility | Payer: Medicare Other | Attending: Family Medicine | Admitting: Family Medicine

## 2022-08-11 ENCOUNTER — Encounter (HOSPITAL_COMMUNITY): Payer: Self-pay

## 2022-08-11 ENCOUNTER — Emergency Department (HOSPITAL_COMMUNITY): Payer: Medicare Other

## 2022-08-11 ENCOUNTER — Other Ambulatory Visit: Payer: Self-pay

## 2022-08-11 DIAGNOSIS — W19XXXA Unspecified fall, initial encounter: Secondary | ICD-10-CM

## 2022-08-11 DIAGNOSIS — M25551 Pain in right hip: Secondary | ICD-10-CM | POA: Diagnosis not present

## 2022-08-11 DIAGNOSIS — I252 Old myocardial infarction: Secondary | ICD-10-CM | POA: Diagnosis not present

## 2022-08-11 DIAGNOSIS — S72001D Fracture of unspecified part of neck of right femur, subsequent encounter for closed fracture with routine healing: Secondary | ICD-10-CM | POA: Diagnosis not present

## 2022-08-11 DIAGNOSIS — Z91040 Latex allergy status: Secondary | ICD-10-CM

## 2022-08-11 DIAGNOSIS — S72001A Fracture of unspecified part of neck of right femur, initial encounter for closed fracture: Secondary | ICD-10-CM

## 2022-08-11 DIAGNOSIS — I480 Paroxysmal atrial fibrillation: Secondary | ICD-10-CM

## 2022-08-11 DIAGNOSIS — Z79899 Other long term (current) drug therapy: Secondary | ICD-10-CM | POA: Diagnosis not present

## 2022-08-11 DIAGNOSIS — E876 Hypokalemia: Secondary | ICD-10-CM | POA: Diagnosis not present

## 2022-08-11 DIAGNOSIS — K5900 Constipation, unspecified: Secondary | ICD-10-CM | POA: Diagnosis not present

## 2022-08-11 DIAGNOSIS — I2510 Atherosclerotic heart disease of native coronary artery without angina pectoris: Secondary | ICD-10-CM | POA: Diagnosis present

## 2022-08-11 DIAGNOSIS — D62 Acute posthemorrhagic anemia: Secondary | ICD-10-CM | POA: Diagnosis not present

## 2022-08-11 DIAGNOSIS — Z471 Aftercare following joint replacement surgery: Secondary | ICD-10-CM | POA: Diagnosis not present

## 2022-08-11 DIAGNOSIS — Z9181 History of falling: Secondary | ICD-10-CM | POA: Diagnosis not present

## 2022-08-11 DIAGNOSIS — W19XXXD Unspecified fall, subsequent encounter: Secondary | ICD-10-CM | POA: Diagnosis not present

## 2022-08-11 DIAGNOSIS — I10 Essential (primary) hypertension: Secondary | ICD-10-CM | POA: Diagnosis not present

## 2022-08-11 DIAGNOSIS — J449 Chronic obstructive pulmonary disease, unspecified: Secondary | ICD-10-CM | POA: Diagnosis not present

## 2022-08-11 DIAGNOSIS — R001 Bradycardia, unspecified: Secondary | ICD-10-CM | POA: Diagnosis present

## 2022-08-11 DIAGNOSIS — Z952 Presence of prosthetic heart valve: Secondary | ICD-10-CM

## 2022-08-11 DIAGNOSIS — N189 Chronic kidney disease, unspecified: Secondary | ICD-10-CM | POA: Diagnosis present

## 2022-08-11 DIAGNOSIS — R2681 Unsteadiness on feet: Secondary | ICD-10-CM | POA: Diagnosis not present

## 2022-08-11 DIAGNOSIS — I5042 Chronic combined systolic (congestive) and diastolic (congestive) heart failure: Secondary | ICD-10-CM | POA: Diagnosis not present

## 2022-08-11 DIAGNOSIS — Z888 Allergy status to other drugs, medicaments and biological substances status: Secondary | ICD-10-CM | POA: Diagnosis not present

## 2022-08-11 DIAGNOSIS — D649 Anemia, unspecified: Secondary | ICD-10-CM | POA: Diagnosis not present

## 2022-08-11 DIAGNOSIS — E039 Hypothyroidism, unspecified: Secondary | ICD-10-CM | POA: Diagnosis not present

## 2022-08-11 DIAGNOSIS — Z043 Encounter for examination and observation following other accident: Secondary | ICD-10-CM | POA: Diagnosis not present

## 2022-08-11 DIAGNOSIS — R6889 Other general symptoms and signs: Secondary | ICD-10-CM | POA: Diagnosis not present

## 2022-08-11 DIAGNOSIS — I130 Hypertensive heart and chronic kidney disease with heart failure and stage 1 through stage 4 chronic kidney disease, or unspecified chronic kidney disease: Secondary | ICD-10-CM | POA: Diagnosis present

## 2022-08-11 DIAGNOSIS — E871 Hypo-osmolality and hyponatremia: Secondary | ICD-10-CM | POA: Diagnosis present

## 2022-08-11 DIAGNOSIS — Z743 Need for continuous supervision: Secondary | ICD-10-CM | POA: Diagnosis not present

## 2022-08-11 DIAGNOSIS — Z7901 Long term (current) use of anticoagulants: Secondary | ICD-10-CM | POA: Diagnosis not present

## 2022-08-11 DIAGNOSIS — I878 Other specified disorders of veins: Secondary | ICD-10-CM | POA: Diagnosis not present

## 2022-08-11 DIAGNOSIS — I8391 Asymptomatic varicose veins of right lower extremity: Secondary | ICD-10-CM | POA: Diagnosis not present

## 2022-08-11 DIAGNOSIS — Z79890 Hormone replacement therapy (postmenopausal): Secondary | ICD-10-CM

## 2022-08-11 DIAGNOSIS — F419 Anxiety disorder, unspecified: Secondary | ICD-10-CM

## 2022-08-11 DIAGNOSIS — Z950 Presence of cardiac pacemaker: Secondary | ICD-10-CM | POA: Diagnosis not present

## 2022-08-11 DIAGNOSIS — Z01818 Encounter for other preprocedural examination: Secondary | ICD-10-CM

## 2022-08-11 DIAGNOSIS — K219 Gastro-esophageal reflux disease without esophagitis: Secondary | ICD-10-CM | POA: Diagnosis present

## 2022-08-11 DIAGNOSIS — I4891 Unspecified atrial fibrillation: Secondary | ICD-10-CM | POA: Diagnosis not present

## 2022-08-11 DIAGNOSIS — M6281 Muscle weakness (generalized): Secondary | ICD-10-CM | POA: Diagnosis not present

## 2022-08-11 DIAGNOSIS — E785 Hyperlipidemia, unspecified: Secondary | ICD-10-CM | POA: Diagnosis not present

## 2022-08-11 DIAGNOSIS — M25571 Pain in right ankle and joints of right foot: Secondary | ICD-10-CM | POA: Diagnosis not present

## 2022-08-11 DIAGNOSIS — Y92008 Other place in unspecified non-institutional (private) residence as the place of occurrence of the external cause: Secondary | ICD-10-CM

## 2022-08-11 DIAGNOSIS — S72041A Displaced fracture of base of neck of right femur, initial encounter for closed fracture: Secondary | ICD-10-CM | POA: Diagnosis not present

## 2022-08-11 DIAGNOSIS — M810 Age-related osteoporosis without current pathological fracture: Secondary | ICD-10-CM | POA: Diagnosis not present

## 2022-08-11 DIAGNOSIS — Y92009 Unspecified place in unspecified non-institutional (private) residence as the place of occurrence of the external cause: Secondary | ICD-10-CM

## 2022-08-11 DIAGNOSIS — E440 Moderate protein-calorie malnutrition: Secondary | ICD-10-CM | POA: Diagnosis not present

## 2022-08-11 DIAGNOSIS — R791 Abnormal coagulation profile: Secondary | ICD-10-CM | POA: Diagnosis present

## 2022-08-11 DIAGNOSIS — Z8249 Family history of ischemic heart disease and other diseases of the circulatory system: Secondary | ICD-10-CM

## 2022-08-11 DIAGNOSIS — Z5181 Encounter for therapeutic drug level monitoring: Secondary | ICD-10-CM | POA: Diagnosis not present

## 2022-08-11 DIAGNOSIS — Z7401 Bed confinement status: Secondary | ICD-10-CM | POA: Diagnosis not present

## 2022-08-11 DIAGNOSIS — W07XXXA Fall from chair, initial encounter: Secondary | ICD-10-CM | POA: Diagnosis present

## 2022-08-11 DIAGNOSIS — R0902 Hypoxemia: Secondary | ICD-10-CM | POA: Diagnosis not present

## 2022-08-11 DIAGNOSIS — R531 Weakness: Secondary | ICD-10-CM | POA: Diagnosis not present

## 2022-08-11 DIAGNOSIS — Z96641 Presence of right artificial hip joint: Secondary | ICD-10-CM | POA: Diagnosis not present

## 2022-08-11 DIAGNOSIS — Z955 Presence of coronary angioplasty implant and graft: Secondary | ICD-10-CM

## 2022-08-11 HISTORY — DX: Paroxysmal atrial fibrillation: I48.0

## 2022-08-11 HISTORY — DX: Fracture of unspecified part of neck of right femur, initial encounter for closed fracture: S72.001A

## 2022-08-11 HISTORY — DX: Unspecified fall, initial encounter: W19.XXXA

## 2022-08-11 HISTORY — DX: Abnormal coagulation profile: R79.1

## 2022-08-11 HISTORY — DX: Presence of cardiac pacemaker: Z95.0

## 2022-08-11 LAB — BASIC METABOLIC PANEL
Anion gap: 9 (ref 5–15)
BUN: 13 mg/dL (ref 8–23)
CO2: 29 mmol/L (ref 22–32)
Calcium: 8.7 mg/dL — ABNORMAL LOW (ref 8.9–10.3)
Chloride: 98 mmol/L (ref 98–111)
Creatinine, Ser: 0.83 mg/dL (ref 0.44–1.00)
GFR, Estimated: >60 mL/min (ref >60)
Glucose, Bld: 132 mg/dL — ABNORMAL HIGH (ref 70–99)
Potassium: 3.5 mmol/L (ref 3.5–5.1)
Sodium: 136 mmol/L (ref 135–145)

## 2022-08-11 LAB — MAGNESIUM: Magnesium: 1.9 mg/dL (ref 1.7–2.4)

## 2022-08-11 LAB — CBC WITH DIFFERENTIAL/PLATELET
Abs Immature Granulocytes: 0.04 10*3/uL (ref 0.00–0.07)
Basophils Absolute: 0 10*3/uL (ref 0.0–0.1)
Basophils Relative: 0 %
Eosinophils Absolute: 0 10*3/uL (ref 0.0–0.5)
Eosinophils Relative: 0 %
HCT: 39.4 % (ref 36.0–46.0)
Hemoglobin: 12.7 g/dL (ref 12.0–15.0)
Immature Granulocytes: 1 %
Lymphocytes Relative: 9 %
Lymphs Abs: 0.7 10*3/uL (ref 0.7–4.0)
MCH: 28.5 pg (ref 26.0–34.0)
MCHC: 32.2 g/dL (ref 30.0–36.0)
MCV: 88.3 fL (ref 80.0–100.0)
Monocytes Absolute: 0.5 10*3/uL (ref 0.1–1.0)
Monocytes Relative: 7 %
Neutro Abs: 6.2 10*3/uL (ref 1.7–7.7)
Neutrophils Relative %: 83 %
Platelets: 152 10*3/uL (ref 150–400)
RBC: 4.46 MIL/uL (ref 3.87–5.11)
RDW: 15.4 % (ref 11.5–15.5)
WBC: 7.4 10*3/uL (ref 4.0–10.5)
nRBC: 0 % (ref 0.0–0.2)

## 2022-08-11 LAB — TYPE AND SCREEN
ABO/RH(D): O POS
Antibody Screen: NEGATIVE

## 2022-08-11 LAB — COMPREHENSIVE METABOLIC PANEL
ALT: 18 U/L (ref 0–44)
AST: 31 U/L (ref 15–41)
Albumin: 4 g/dL (ref 3.5–5.0)
Alkaline Phosphatase: 111 U/L (ref 38–126)
Anion gap: 16 — ABNORMAL HIGH (ref 5–15)
BUN: 16 mg/dL (ref 8–23)
CO2: 24 mmol/L (ref 22–32)
Calcium: 9.5 mg/dL (ref 8.9–10.3)
Chloride: 95 mmol/L — ABNORMAL LOW (ref 98–111)
Creatinine, Ser: 1.01 mg/dL — ABNORMAL HIGH (ref 0.44–1.00)
GFR, Estimated: 58 mL/min — ABNORMAL LOW (ref >60)
Glucose, Bld: 120 mg/dL — ABNORMAL HIGH (ref 70–99)
Potassium: 2.4 mmol/L — CL (ref 3.5–5.1)
Sodium: 135 mmol/L (ref 135–145)
Total Bilirubin: 0.8 mg/dL (ref 0.3–1.2)
Total Protein: 7.4 g/dL (ref 6.5–8.1)

## 2022-08-11 LAB — PROTIME-INR
INR: 4.6 (ref 0.8–1.2)
INR: 1.8 — ABNORMAL HIGH (ref 0.8–1.2)
Prothrombin Time: 42.9 seconds — ABNORMAL HIGH (ref 11.4–15.2)
Prothrombin Time: 21 seconds — ABNORMAL HIGH (ref 11.4–15.2)

## 2022-08-11 LAB — TSH: TSH: 10.38 u[IU]/mL — ABNORMAL HIGH (ref 0.350–4.500)

## 2022-08-11 LAB — SURGICAL PCR SCREEN
MRSA, PCR: NEGATIVE
Staphylococcus aureus: NEGATIVE

## 2022-08-11 MED ORDER — CAMPHOR-MENTHOL-METHYL SAL 3.1-6-10 % EX PTCH: MEDICATED_PATCH | Freq: Every day | CUTANEOUS | Status: DC | PRN

## 2022-08-11 MED ORDER — TRANEXAMIC ACID-NACL 1000-0.7 MG/100ML-% IV SOLN
1000.0000 mg | INTRAVENOUS | Status: AC
  Administered 2022-08-12: 1000 mg via INTRAVENOUS
  Filled 2022-08-11: qty 100

## 2022-08-11 MED ORDER — POVIDONE-IODINE 10 % EX SWAB: 2.0000 | Freq: Once | CUTANEOUS | Status: DC

## 2022-08-11 MED ORDER — ONDANSETRON HCL 4 MG/2ML IJ SOLN: 4.0000 mg | Freq: Four times a day (QID) | INTRAMUSCULAR | Status: DC | PRN

## 2022-08-11 MED ORDER — CYCLOBENZAPRINE HCL 5 MG PO TABS
5.0000 mg | ORAL_TABLET | Freq: Every evening | ORAL | Status: DC | PRN
  Administered 2022-08-13 – 2022-08-16 (×4): 5 mg via ORAL
  Filled 2022-08-11 (×6): qty 1

## 2022-08-11 MED ORDER — MORPHINE SULFATE (PF) 4 MG/ML IV SOLN: 4.0000 mg | Freq: Once | INTRAVENOUS | Status: DC

## 2022-08-11 MED ORDER — HYDROMORPHONE HCL 1 MG/ML IJ SOLN
1.0000 mg | Freq: Once | INTRAMUSCULAR | Status: AC
  Administered 2022-08-11: 1 mg via INTRAVENOUS
  Filled 2022-08-11: qty 1

## 2022-08-11 MED ORDER — HEPARIN BOLUS VIA INFUSION
2500.0000 [IU] | Freq: Once | INTRAVENOUS | Status: AC
  Administered 2022-08-11: 2500 [IU] via INTRAVENOUS
  Filled 2022-08-11: qty 2500

## 2022-08-11 MED ORDER — HEPARIN (PORCINE) 25000 UT/250ML-% IV SOLN
750.0000 [IU]/h | INTRAVENOUS | Status: DC
  Administered 2022-08-11: 750 [IU]/h via INTRAVENOUS
  Filled 2022-08-11: qty 250

## 2022-08-11 MED ORDER — SENNOSIDES-DOCUSATE SODIUM 8.6-50 MG PO TABS
1.0000 | ORAL_TABLET | Freq: Every day | ORAL | Status: DC
  Administered 2022-08-11 – 2022-08-17 (×7): 1 via ORAL
  Filled 2022-08-11 (×7): qty 1

## 2022-08-11 MED ORDER — FUROSEMIDE 20 MG PO TABS
30.0000 mg | ORAL_TABLET | Freq: Every day | ORAL | Status: DC
  Administered 2022-08-11 – 2022-08-18 (×7): 30 mg via ORAL
  Filled 2022-08-11 (×7): qty 2

## 2022-08-11 MED ORDER — POTASSIUM CHLORIDE 10 MEQ/100ML IV SOLN
10.0000 meq | INTRAVENOUS | Status: AC
  Administered 2022-08-11 (×4): 10 meq via INTRAVENOUS
  Filled 2022-08-11 (×4): qty 100

## 2022-08-11 MED ORDER — POTASSIUM CHLORIDE CRYS ER 20 MEQ PO TBCR
40.0000 meq | EXTENDED_RELEASE_TABLET | ORAL | Status: AC
  Administered 2022-08-11: 40 meq via ORAL
  Filled 2022-08-11: qty 2

## 2022-08-11 MED ORDER — HYDROCODONE-ACETAMINOPHEN 5-325 MG PO TABS: 1.0000 | ORAL_TABLET | Freq: Four times a day (QID) | ORAL | Status: DC | PRN

## 2022-08-11 MED ORDER — MORPHINE SULFATE (PF) 2 MG/ML IV SOLN
INTRAVENOUS | Status: AC
  Administered 2022-08-11: 4 mg via INTRAVENOUS
  Filled 2022-08-11: qty 2

## 2022-08-11 MED ORDER — ACETAMINOPHEN 650 MG RE SUPP: 650.0000 mg | Freq: Four times a day (QID) | RECTAL | Status: DC | PRN

## 2022-08-11 MED ORDER — MORPHINE SULFATE (PF) 2 MG/ML IV SOLN: 0.5000 mg | INTRAVENOUS | Status: DC | PRN

## 2022-08-11 MED ORDER — ACETAMINOPHEN 325 MG PO TABS
650.0000 mg | ORAL_TABLET | Freq: Four times a day (QID) | ORAL | Status: DC | PRN
  Administered 2022-08-14 – 2022-08-16 (×3): 650 mg via ORAL
  Filled 2022-08-11 (×3): qty 2

## 2022-08-11 MED ORDER — SODIUM CHLORIDE 0.9% IV SOLUTION: Freq: Once | INTRAVENOUS | Status: AC

## 2022-08-11 MED ORDER — LIDOCAINE 5 % EX PTCH
1.0000 | MEDICATED_PATCH | CUTANEOUS | Status: DC
  Administered 2022-08-11 – 2022-08-17 (×6): 1 via TRANSDERMAL
  Filled 2022-08-11 (×5): qty 1

## 2022-08-11 MED ORDER — HYDROMORPHONE HCL 1 MG/ML IJ SOLN
0.5000 mg | INTRAMUSCULAR | Status: DC | PRN
  Administered 2022-08-11 – 2022-08-12 (×7): 0.5 mg via INTRAVENOUS
  Filled 2022-08-11 (×2): qty 1
  Filled 2022-08-11 (×4): qty 0.5
  Filled 2022-08-11: qty 1

## 2022-08-11 MED ORDER — CHLORHEXIDINE GLUCONATE 4 % EX LIQD: 60.0000 mL | Freq: Once | CUTANEOUS | Status: DC

## 2022-08-11 MED ORDER — NALOXONE HCL 0.4 MG/ML IJ SOLN: 0.4000 mg | INTRAMUSCULAR | Status: DC | PRN

## 2022-08-11 MED ORDER — ALPRAZOLAM 0.5 MG PO TABS
1.5000 mg | ORAL_TABLET | Freq: Two times a day (BID) | ORAL | Status: DC | PRN
  Administered 2022-08-11 – 2022-08-17 (×10): 1.5 mg via ORAL
  Filled 2022-08-11 (×5): qty 3
  Filled 2022-08-11: qty 6
  Filled 2022-08-11 (×4): qty 3

## 2022-08-11 MED ORDER — ATORVASTATIN CALCIUM 80 MG PO TABS
80.0000 mg | ORAL_TABLET | Freq: Every day | ORAL | Status: DC
  Administered 2022-08-11 – 2022-08-17 (×7): 80 mg via ORAL
  Filled 2022-08-11 (×7): qty 1

## 2022-08-11 MED ORDER — LEVOTHYROXINE SODIUM 75 MCG PO TABS
75.0000 ug | ORAL_TABLET | Freq: Every day | ORAL | Status: DC
  Administered 2022-08-13 – 2022-08-18 (×6): 75 ug via ORAL
  Filled 2022-08-11 (×6): qty 1

## 2022-08-11 MED ORDER — ZOLPIDEM TARTRATE 5 MG PO TABS
5.0000 mg | ORAL_TABLET | Freq: Every evening | ORAL | Status: DC | PRN
  Administered 2022-08-12: 5 mg via ORAL
  Filled 2022-08-11 (×2): qty 1

## 2022-08-11 MED ORDER — HYDROCHLOROTHIAZIDE 25 MG PO TABS
25.0000 mg | ORAL_TABLET | Freq: Every day | ORAL | Status: DC
  Administered 2022-08-11 – 2022-08-14 (×3): 25 mg via ORAL
  Filled 2022-08-11 (×3): qty 1

## 2022-08-11 MED ORDER — VITAMIN K1 10 MG/ML IJ SOLN
3.0000 mg | Freq: Once | INTRAVENOUS | Status: AC
  Administered 2022-08-11: 3 mg via INTRAVENOUS
  Filled 2022-08-11: qty 0.3

## 2022-08-11 MED ORDER — DILTIAZEM HCL ER COATED BEADS 240 MG PO CP24
240.0000 mg | ORAL_CAPSULE | Freq: Every day | ORAL | Status: DC | PRN
  Administered 2022-08-13: 240 mg via ORAL
  Filled 2022-08-11 (×2): qty 1

## 2022-08-11 MED ORDER — CEFAZOLIN SODIUM-DEXTROSE 2-4 GM/100ML-% IV SOLN
2.0000 g | INTRAVENOUS | Status: AC
  Administered 2022-08-12: 2 g via INTRAVENOUS
  Filled 2022-08-11: qty 100

## 2022-08-11 NOTE — Progress Notes (Signed)
Pt. Arrived to unit no c/o pain, Bed at lowest position call bed within reach

## 2022-08-11 NOTE — ED Notes (Signed)
Pt in room A&O x4. Requesting pain medication and a xanax. Pain medication administered. Educated pt that no Xnax is order that she is NPO at this time for possible surgery and cannot have much water or meds. Pt receptive to information given. NV intact distally bilaterally. Updated on plan of care.

## 2022-08-11 NOTE — ED Notes (Signed)
This RN noticed no potassium ordered for pt's K+ of 2.4. MD Howerter notified.

## 2022-08-11 NOTE — ED Provider Notes (Signed)
[facility] EMERGENCY DEPARTMENT Provider Note   CSN: [id number] Arrival date & time: 08/11/22  0136     History  Chief Complaint  Patient presents with   Hip Pain    [name] [name] is a 76 y.o. female.  The history is provided by the patient and medical records.   76 y.o. F with hx of CHF, CKD, HTN, GERD, hx TAVR on coumadin, presenitng to the ED with right hip/knee pain.  Patient reports she was sitting on the cough with right leg bent beneath her, tried to stand up and felt immediate pain in right hip.  States also has some pain in right knee.  Denies head injury or LOC.  Has not been able to walk/bear weight since that occurred.  fentanyl given en route.  Home Medications Prior to Admission medications   Medication Sig Start Date End Date Taking? Authorizing Provider  pantoprazole (PROTONIX) 40 MG tablet Take 1 tablet (40 mg total) by mouth daily. 06/05/22   Baldo Daub, MD  albuterol (VENTOLIN HFA) 108 (90 Base) MCG/ACT inhaler Inhale 2 puffs into the lungs every 6 (six) hours as needed for shortness of breath. 03/21/14   [provider]  ALPRAZolam Prudy Feeler) 1 MG tablet Take 0.5-1 mg by mouth 2 (two) times daily as needed for anxiety.    [provider]  atorvastatin (LIPITOR) 80 MG tablet Take 80 mg by mouth at bedtime. 11/27/11   [provider]  DILT-XR 240 MG 24 hr capsule Take 240 mg by mouth 2 (two) times daily. 10/21/21   [provider]  diltiazem (DILACOR XR) 240 MG 24 hr capsule Take 240 mg by mouth daily as needed (tachycardia).    [provider]  docusate sodium (COLACE) 100 MG capsule Take 1 capsule (100 mg total) by mouth 2 (two) times daily. 10/06/21   Swayze, Ava, DO  KLOR-CON M20 20 MEQ tablet Take 3 tablets (60 mEq total) by mouth See admin instructions. Crush and mix 60 mEq with applesauce and eat once a day 12/11/21   Janetta Hora, PA-C  levothyroxine (SYNTHROID) 75 MCG tablet Take  75 mcg by mouth daily before breakfast. 03/12/21 05/18/22  [provider]  nitroGLYCERIN (NITROSTAT) 0.4 MG SL tablet Place 1 tablet (0.4 mg total) under the tongue every 5 (five) minutes as needed for chest pain. 05/18/22   Baldo Daub, MD  warfarin (COUMADIN) 7.5 MG tablet Take 1 tablet (7.5 mg total) by mouth daily at 4 PM. 10/17/21   Filbert Schilder, NP  zolpidem (AMBIEN) 10 MG tablet Take 5-10 mg by mouth at bedtime. 11/27/11   [provider]  furosemide (LASIX) 20 MG tablet Take 40 mg by mouth 2 (two) times daily. 09/21/21   [provider]      Allergies    Atenolol, Metoprolol succinate [metoprolol], Baclofen, and Latex    Review of Systems   Review of Systems  Musculoskeletal:  Positive for arthralgias.  All other systems reviewed and are negative.   Physical Exam Updated Vital Signs BP (!) 149/86   Pulse 73   Temp 98.1 F (36.7 C)   Resp 18   Ht 4\' 10"  (1.473 m)   Wt 52.2 kg   SpO2 95%   BMI 24.04 kg/m   Physical Exam Vitals and nursing note reviewed.  Constitutional:      Appearance: She is well-developed.  HENT:     Head: Normocephalic and atraumatic.  Comments: No visible head trauma Eyes:     Conjunctiva/sclera: Conjunctivae normal.     Pupils: Pupils are equal, round, and reactive to light.  Cardiovascular:     Rate and Rhythm: Normal rate and regular rhythm.     Heart sounds: Normal heart sounds.  Pulmonary:     Effort: Pulmonary effort is normal. No respiratory distress.     Breath sounds: Normal breath sounds. No rhonchi.  Abdominal:     General: Bowel sounds are normal.     Palpations: Abdomen is soft.  Musculoskeletal:        General: Normal range of motion.     Cervical back: Normal range of motion.     Comments: Right leg shortened and externally rotated, DP pulse intact distally  Skin:    General: Skin is warm and dry.  Neurological:     Mental Status: She is alert and oriented to person, place, and time.      Comments: AAOx3, moving extremities well aside from RLE due to injury, speech clear and goal oriented     ED Results / Procedures / Treatments   Labs (all labs ordered are listed, but only abnormal results are displayed) Labs Reviewed  COMPREHENSIVE METABOLIC PANEL - Abnormal; Notable for the following components:      Result Value   Potassium 2.4 (*)    Chloride 95 (*)    Glucose, Bld 120 (*)    Creatinine, Ser 1.01 (*)    GFR, Estimated 58 (*)    Anion gap 16 (*)    All other components within normal limits  PROTIME-INR - Abnormal; Notable for the following components:   Prothrombin Time 42.9 (*)    INR 4.6 (*)    All other components within normal limits  CBC WITH DIFFERENTIAL/PLATELET  MAGNESIUM  TYPE AND SCREEN    EKG None  Radiology DG Knee Complete 4 Views Right  Result Date: 08/11/2022 CLINICAL DATA:  Known right hip fracture, initial encounter EXAM: RIGHT KNEE - COMPLETE 4+ VIEW COMPARISON:  None Available. FINDINGS: No acute fracture or dislocation is noted. Multiple varicosities are noted along the lateral aspect of the thigh. IMPRESSION: No acute bony abnormality noted. Electronically Signed   By: Inez Catalina M.D.   On: 08/11/2022 02:15   DG Chest 1 View  Result Date: 08/11/2022 CLINICAL DATA:  Recent fall EXAM: CHEST  1 VIEW COMPARISON:  10/16/2021 FINDINGS: Cardiac shadow is enlarged. Postsurgical changes are noted. Pacing device is again seen. Aortic calcifications are noted. The lungs are well aerated without focal infiltrate or effusion. No bony abnormality is noted. IMPRESSION: No active disease. Electronically Signed   By: Inez Catalina M.D.   On: 08/11/2022 02:14   DG Hip Unilat W or Wo Pelvis 2-3 Views Right  Result Date: 08/11/2022 CLINICAL DATA:  Fall EXAM: DG HIP (WITH OR WITHOUT PELVIS) 2-3V RIGHT COMPARISON:  None Available. FINDINGS: Minimally displaced fracture of the right femoral neck. No other pelvic fracture. The right femoral head remains  located within the acetabulum. IMPRESSION: Minimally displaced fracture of the right femoral neck. Electronically Signed   By: Ulyses Jarred M.D.   On: 08/11/2022 02:14    Procedures Procedures    Medications Ordered in ED Medications  morphine (PF) 4 MG/ML injection 4 mg (4 mg Intravenous Not Given 08/11/22 0149)  morphine (PF) 2 MG/ML injection (4 mg Intravenous Given 08/11/22 0152)    ED Course/ Medical Decision Making/ A&P  Medical Decision Making Amount and/or Complexity of Data Reviewed Labs: ordered. Radiology: ordered and independent interpretation performed. ECG/medicine tests: ordered and independent interpretation performed.  Risk Prescription drug management. Decision regarding hospitalization.   77 y.o. F here after fall from couch.  Had been sitting on cough with right leg bent beneath her, tried to stand up and fell to the floor.  No head injury or LOC.  Arrives to ED with right leg shortened and externally rotated.  DP pulse intact peripherally.  Clinically suspect acute hip fracture-- x-rays confirms right femoral neck fracture.  No other injuries noted.  Labs pending.  Will consult orthopedics.  2:28 AM Spoke with orthopedics, Dr. Sherilyn Dacosta-- keep NPO for now, surgery depends on when INR normalizes.  Can do vitamin K for reversal for earlier repair.  K+ low today at 2.4, Mg+ normal.  Will need replacement.  INR also elevated at 4.6.  Discussed with Dr. Arlean Hopping-- will admit for ongoing care.  Final Clinical Impression(s) / ED Diagnoses Final diagnoses:  Closed fracture of right hip, initial encounter [facility])  Chronic anticoagulation    Rx / DC Orders ED Discharge Orders     None         Garlon Hatchet, PA-C 08/11/22 0308    Gilda Crease, MD 08/11/22 [phone]

## 2022-08-11 NOTE — Consult Note (Signed)
Reason for Consult:Right hip fx Referring Physician: Fuller Plan Time called: 0730 Time at bedside: [name] is an 76 y.o. female.  HPI: [name] [name] was at home sitting in her recliner and didn't realize her foot had fallen asleep. She got up from the chair and fell. She had immediate right hip pain and could not get up. She was brought to the ED where x-rays showed a right hip fx and orthopedic surgery was consulted. She lives alone and does not use any assistive devices to ambulate.  Past Medical History:  Diagnosis Date   Anxiety    Aortic aneurysm [facility])    Atrial arrhythmia    With prior history of atrial flutter ablation   Bradycardia    s/p pacemaker implantation   CONGESTIVE HEART FAILURE 07/31/2010   Qualifier: Diagnosis of  By: Caryl Comes, MD, Remus Blake    GERD (gastroesophageal reflux disease)    Hypertension    Hypothyroidism    Long Q-T syndrome    Myocardial infarct ([place]) 11/20/2011   Stent placement for 100% blockage of LAD   PACEMAKER, PERMANENT 2009   Medtronic Adapta serial # [id number]   Rheumatic heart disease    With mitral valve replacement, tricuspid regurgitation moderate to severe, significant left atrial enlargment   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 Burt Knack and Dr. Cyndia Bent   TRICUSPID REGURGITATION 12/23/2010   Qualifier: Diagnosis of  By: Caryl Comes, MD, Remus Blake    Unspecified essential hypertension 07/31/2010   Qualifier: Diagnosis of  By: Caryl Comes, MD, [facility], Mack Guise     Past Surgical History:  Procedure Laterality Date   APPENDECTOMY     CARDIAC ELECTROPHYSIOLOGY STUDY AND ABLATION     Stent and pacemaker placement   CHOLECYSTECTOMY     CORONARY ANGIOPLASTY WITH STENT PLACEMENT Left 09/13/2015   Dr. Gwenith Spitz at [facility], [place] stent   INTRAOPERATIVE TRANSTHORACIC ECHOCARDIOGRAM N/A 10/14/2021   Procedure: INTRAOPERATIVE TRANSTHORACIC  ECHOCARDIOGRAM;  Surgeon: Sherren Mocha, MD;  Location: [place];  Service: Open Heart Surgery;  Laterality: N/A;   MITRAL VALVE REPLACEMENT  2001   mechanical valve   OOPHORECTOMY     PACEMAKER INSERTION  11/20/2007   Dual chamber pacemaker implantation with rapid ventricular pacing   RIGHT/LEFT HEART CATH AND CORONARY ANGIOGRAPHY N/A 10/03/2021   Procedure: RIGHT/LEFT HEART CATH AND CORONARY ANGIOGRAPHY;  Surgeon: Sherren Mocha, MD;  Location: [place] [facility];  Service: Cardiovascular;  Laterality: N/A;   SALPINGECTOMY Bilateral    TONSILLECTOMY     TRANSCATHETER AORTIC VALVE REPLACEMENT, TRANSFEMORAL N/A 10/14/2021   Procedure: TRANSCATHETER AORTIC VALVE REPLACEMENT, TRANSFEMORAL;  Surgeon: Sherren Mocha, MD;  Location: [place];  Service: Open Heart Surgery;  Laterality: N/A;    Family History  Problem Relation Age of Onset   Leukemia Mother    Stroke Mother    Heart disease Father    Stroke Maternal Grandmother    Heart attack Paternal Grandmother     Social History:  reports that she has never smoked. She has never used smokeless tobacco. She reports that she does not drink alcohol and does not use drugs.  Allergies:  Allergies  Allergen Reactions   Atenolol Other (See Comments)    HR and pulse dropped   Metoprolol Succinate [Metoprolol] Other (See Comments)    HR and pulse dropped   Baclofen Anxiety and Other (See Comments)    Makes the patient feel anxious the  day after taking this. "Makes me jerk"    Latex Rash    Medications: I have reviewed the patient's current medications.  Results for orders placed or performed during the hospital encounter of 08/11/22 (from the past 48 hour(s))  CBC with Differential     Status: None   Collection Time: 08/11/22  1:44 AM  Result Value Ref Range   WBC 7.4 4.0 - 10.5 K/uL   RBC 4.46 3.87 - 5.11 MIL/uL   Hemoglobin 12.7 12.0 - 15.0 g/dL   HCT 39.4 36.0 - 46.0 %   MCV 88.3 80.0 - 100.0 fL   MCH 28.5 26.0 - 34.0 pg   MCHC  32.2 30.0 - 36.0 g/dL   RDW 15.4 11.5 - 15.5 %   Platelets 152 150 - 400 K/uL   nRBC 0.0 0.0 - 0.2 %   Neutrophils Relative % 83 %   Neutro Abs 6.2 1.7 - 7.7 K/uL   Lymphocytes Relative 9 %   Lymphs Abs 0.7 0.7 - 4.0 K/uL   Monocytes Relative 7 %   Monocytes Absolute 0.5 0.1 - 1.0 K/uL   Eosinophils Relative 0 %   Eosinophils Absolute 0.0 0.0 - 0.5 K/uL   Basophils Relative 0 %   Basophils Absolute 0.0 0.0 - 0.1 K/uL   Immature Granulocytes 1 %   Abs Immature Granulocytes 0.04 0.00 - 0.07 K/uL    Comment: Performed at [facility], [address] [place], [place] [address]  Comprehensive metabolic panel     Status: Abnormal   Collection Time: 08/11/22  1:44 AM  Result Value Ref Range   Sodium 135 135 - 145 mmol/L   Potassium 2.4 (LL) 3.5 - 5.1 mmol/L    Comment: CRITICAL RESULT CALLED TO, READ BACK BY AND VERIFIED WITH L. SANDERS, PAC, 0241 08/11/22, A. RAMSEY   Chloride 95 (L) 98 - 111 mmol/L   CO2 24 22 - 32 mmol/L   Glucose, Bld 120 (H) 70 - 99 mg/dL    Comment: Glucose reference range applies only to samples taken after fasting for at least 8 hours.   BUN 16 8 - 23 mg/dL   Creatinine, Ser 1.01 (H) 0.44 - 1.00 mg/dL   Calcium 9.5 8.9 - 10.3 mg/dL   Total Protein 7.4 6.5 - 8.1 g/dL   Albumin 4.0 3.5 - 5.0 g/dL   AST 31 15 - 41 U/L   ALT 18 0 - 44 U/L   Alkaline Phosphatase 111 38 - 126 U/L   Total Bilirubin 0.8 0.3 - 1.2 mg/dL   GFR, Estimated 58 (L) >60 mL/min    Comment: (NOTE) Calculated using the CKD-EPI Creatinine Equation (2021)    Anion gap 16 (H) 5 - 15    Comment: Electrolytes repeated to confirm. Performed at [facility], [address] [place], [place] [address]   Protime-INR     Status: Abnormal   Collection Time: 08/11/22  1:44 AM  Result Value Ref Range   Prothrombin Time 42.9 (H) 11.4 - 15.2 seconds   INR 4.6 (HH) 0.8 - 1.2    Comment: REPEATED TO VERIFY CRITICAL RESULT CALLED TO, READ BACK BY AND VERIFIED WITH:  K. GIBSON, RN, 0249,  08/11/22, EADEDOKUN (NOTE) INR goal varies based on device and disease states. Performed at [facility], [address] [place], [place] [address]   Type and screen     Status: None   Collection Time: 08/11/22  1:44 AM  Result Value Ref   Range   ABO/RH(D) O POS    Antibody Screen NEG    Sample Expiration      08/14/2022,2359 Performed at [facility], [address], [place], [place] [address]   Magnesium     Status: None   Collection Time: 08/11/22  1:44 AM  Result Value Ref Range   Magnesium 1.9 1.7 - 2.4 mg/dL    Comment: Performed at [facility], [address] [place], [place] [address]  Prepare fresh frozen plasma     Status: None (Preliminary result)   Collection Time: 08/11/22  3:17 AM  Result Value Ref Range   Unit Number [id number]    Blood Component Type THW PLS APHR    Unit division 00    Status of Unit ISSUED    Transfusion Status      OK TO TRANSFUSE Performed at [facility], [address] [place], [place] [address]    Unit Number [id number]    Blood Component Type THW PLS APHR    Unit division 00    Status of Unit ISSUED    Transfusion Status OK TO TRANSFUSE     DG Knee Complete 4 Views Right  Result Date: 08/11/2022 CLINICAL DATA:  Known right hip fracture, initial encounter EXAM: RIGHT KNEE - COMPLETE 4+ VIEW COMPARISON:  None Available. FINDINGS: No acute fracture or dislocation is noted. Multiple varicosities are noted along the lateral aspect of the thigh. IMPRESSION: No acute bony abnormality noted. Electronically Signed   By: Alcide Clever M.D.   On: 08/11/2022 02:15   DG Chest 1 View  Result Date: 08/11/2022 CLINICAL DATA:  Recent fall EXAM: CHEST  1 VIEW COMPARISON:  10/16/2021 FINDINGS: Cardiac shadow is enlarged. Postsurgical changes are noted. Pacing device is again seen. Aortic calcifications are noted. The lungs are well aerated without focal infiltrate or effusion. No bony abnormality is noted. IMPRESSION:  No active disease. Electronically Signed   By: Alcide Clever M.D.   On: 08/11/2022 02:14   DG Hip Unilat W or Wo Pelvis 2-3 Views Right  Result Date: 08/11/2022 CLINICAL DATA:  Fall EXAM: DG HIP (WITH OR WITHOUT PELVIS) 2-3V RIGHT COMPARISON:  None Available. FINDINGS: Minimally displaced fracture of the right femoral neck. No other pelvic fracture. The right femoral head remains located within the acetabulum. IMPRESSION: Minimally displaced fracture of the right femoral neck. Electronically Signed   By: Deatra Robinson M.D.   On: 08/11/2022 02:14    Review of Systems  HENT:  Negative for ear discharge, ear pain, hearing loss and tinnitus.   Eyes:  Negative for photophobia and pain.  Respiratory:  Negative for cough and shortness of breath.   Cardiovascular:  Negative for chest pain.  Gastrointestinal:  Negative for abdominal pain, nausea and vomiting.  Genitourinary:  Negative for dysuria, flank pain, frequency and urgency.  Musculoskeletal:  Positive for arthralgias (Right hip). Negative for back pain, myalgias and neck pain.  Neurological:  Negative for dizziness and headaches.  Hematological:  Does not bruise/bleed easily.  Psychiatric/Behavioral:  The patient is not nervous/anxious.    Blood pressure 134/87, pulse 65, temperature 98.7 F (37.1 C), temperature source Oral, resp. rate 13, height 4\' 10"  (1.473 m), weight 52.2 kg, SpO2 100 %. Physical Exam Constitutional:      General: She is not in acute distress.    Appearance: She is well-developed. She is not diaphoretic.  HENT:     Head: Normocephalic and atraumatic.  Eyes:  General: No scleral icterus.       Right eye: No discharge.        Left eye: No discharge.     Conjunctiva/sclera: Conjunctivae normal.  Cardiovascular:     Rate and Rhythm: Normal rate and regular rhythm.  Pulmonary:     Effort: Pulmonary effort is normal. No respiratory distress.  Musculoskeletal:     Cervical back: Normal range of motion.      Comments: LLE No traumatic wounds, ecchymosis, or rash  Mod TTP hip  No knee or ankle effusion  Knee stable to varus/ valgus and anterior/posterior stress  Sens DPN, SPN, TN intact  Motor EHL, ext, flex, evers 5/5  DP 1+, PT 2+, No significant edema  Skin:    General: Skin is warm and dry.  Neurological:     Mental Status: She is alert.  Psychiatric:        Mood and Affect: Mood normal.        Behavior: Behavior normal.    Assessment/Plan: Right hip fx -- Plan hip hemi vs THA once INR normalized and cardiac clearance obtained. Tentatively scheduled for tomorrow with Dr. Blanchie Dessert. Multiple medical problems including hypertension, hyperlipidemia, paroxysmal atrial fibrillation on anticoagulation warfarin, severe aortic stenosis s/p TAVR, congestive heart failure last EF 50-55%, s/p pacemaker, hypothyroidism, AAA, and GERD -- per primary service.   Freeman Caldron, PA-C Orthopedic Surgery [phone] 08/11/2022, 9:55 AM

## 2022-08-11 NOTE — ED Triage Notes (Addendum)
Arrives EMS from after a fall when getting off the sofa.   C/o right lower leg pain and had some pelvic pain that has resolved since arrival. Right leg shortened and rotated.   139mcg Fentanyl given by paramedics.

## 2022-08-11 NOTE — Progress Notes (Signed)
ANTICOAGULATION CONSULT NOTE - Initial Consult  Pharmacy Consult for Heparin Indication: atrial fibrillation and mitral valve  Allergies  Allergen Reactions   Atenolol Other (See Comments)    HR and pulse dropped   Metoprolol Succinate [Metoprolol] Other (See Comments)    HR and pulse dropped   Baclofen Anxiety and Other (See Comments)    Makes the patient feel anxious the day after taking this. "Makes me jerk"    Latex Rash    Patient Measurements: Height: 4\' 10"  (147.3 cm) Weight: 52.2 kg (115 lb) IBW/kg (Calculated) : 40.9 Heparin Dosing Weight: 51.4 kg  Vital Signs: Temp: 98.5 F (36.9 C) (10/24 1334) Temp Source: Oral (10/24 1334) BP: 124/73 (10/24 1215) Pulse Rate: 67 (10/24 1215)  Labs: Recent Labs    08/11/22 0144 08/11/22 1018  HGB 12.7  --   HCT 39.4  --   PLT 152  --   LABPROT 42.9* 21.0*  INR 4.6* 1.8*  CREATININE 1.01*  --     Estimated Creatinine Clearance: 34.5 mL/min (A) (by C-G formula based on SCr of 1.01 mg/dL (H)).   Medical History: Past Medical History:  Diagnosis Date   Anxiety    Aortic aneurysm [facility])    Atrial arrhythmia    With prior history of atrial flutter ablation   Bradycardia    s/p pacemaker implantation   CONGESTIVE HEART FAILURE 07/31/2010   Qualifier: Diagnosis of  By: Caryl Comes, MD, Remus Blake    GERD (gastroesophageal reflux disease)    Hypertension    Hypothyroidism    Long Q-T syndrome    Myocardial infarct ([place]) 11/20/2011   Stent placement for 100% blockage of LAD   PACEMAKER, PERMANENT 2009   Medtronic Adapta serial # [id number]   Rheumatic heart disease    With mitral valve replacement, tricuspid regurgitation moderate to severe, significant left atrial enlargment   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 Burt Knack and Dr. Cyndia Bent   TRICUSPID REGURGITATION 12/23/2010   Qualifier: Diagnosis of  By: Caryl Comes, MD, Remus Blake     Unspecified essential hypertension 07/31/2010   Qualifier: Diagnosis of  By: Caryl Comes, MD, Remus Blake     Medications:  Warfarin 7.5 mg QD PTA   Assessment: Patient presented with right hip pain following a fall at home. Patient received 3 mg Vit K to help normalize elevated INR (4.6) given need for possible surgery. INR now 1.8. Pharmacy consulted for heparin dosing.    Goal of Therapy:  Heparin level 0.3-0.7 units/ml Monitor platelets by anticoagulation protocol: Yes   Plan:  Give 2500 units bolus x 1 Start heparin infusion at 750 units/hr Check anti-Xa level in 8 hours and daily while on heparin Continue to monitor H&H and platelets  Sinda Du, PharmD Candidate 08/11/2022,2:42 PM

## 2022-08-11 NOTE — Progress Notes (Signed)
  Carryover admission to the Day Admitter.  I discussed this case with the EDP,  Quincy Carnes, PA.  Per these discussions:   This is a  35 F w/o h/o paroxysmal atrial fibrillation chronically anticoagulated on warfarin, severe aortic stenosis status post TAVR, who is being admitted with acute right hip fracture after ground-level mechanical fall at home in which she tripped while ambulating.  Did not head , nor was their any loss of consciousness.  Presenting labs notable for supratherapeutic INR of 4.6.   EDP discussed patient's case with on-call orthopedic surgeon, Dr. Mable Fill, Who will formally consult and evaluate the patient in the morning, and requests INR reversal as well as n.p.o. status.   I have placed an order for inpatient admission to med telemetry for further evaluation management of acute right hip fracture following ground-level mechanical fall.  I have placed some additional preliminary admit orders via the adult multi-morbid admission order set. I have also ordered n.p.o., prn IV Dilaudid, as needed IV Zofran.  Vitamin K 3 mg IV x1 now as well as transfusion of 2 units FFP.  Repeat INR ordered for 8 AM.    Babs Bertin, DO Hospitalist

## 2022-08-11 NOTE — Consult Note (Addendum)
Cardiology Consultation   Patient ID: [name] [name] MRN: [id number]; DOB: 10-12-1946  Admit date: 08/11/2022 Date of Consult: 08/11/2022  PCP:  Marylen Ponto, MD   [facility] Providers Cardiologist:  Tonny Bollman, MD   Patient Profile:   [name] [name] is a 76 y.o. female with a hx of rheumatic fever with rheumatic heart disease s/p mechanical MVR, critical aortic stenosis s/p TAVR, paroxysmal atrial fibrillation on Coumadin, bradycardia status post PPM placement, CAD s/p PCI of LAD, COPD who is being seen 08/11/2022 for the evaluation of preop evaluation at the request of Dr. Katrinka Blazing.  History of Present Illness:   [name] [name] is a 76 yo female with PMH noted above.  She is followed by Dr. Dulce Sellar as well as Dr. Edison Pace after undergoing TAVR.  Rheumatic fever at the age of 60 and developed rheumatic heart disease.  Ultimately underwent mechanical mitral valve replacement 20 years ago at [facility].  She has been on long-term Coumadin therapy without reported bleeding issues.  Underwent permanent pacemaker placement (dual-chamber) in 2009 with Dr. Graciela Husbands.  Echocardiogram 07/2021 with severe global LV systolic dysfunction with an EF of 30 to 35%, severe calcification and restriction of the aortic valve leaflets, mild to moderate aortic valve insufficiency and critical aortic stenosis with a mean transvalvular gradient of 53 mmHg.  She underwent TAVR evaluation at [facility] but did not follow through with completing her work-up there.  She was later referred to Dr. Excell Seltzer.  She was admitted 09/2021 for CHF.  She was diuresed with IV Lasix and once able to lie flat for CT scans these were completed.  She underwent a right and left heart catheterization 10/03/2021 which showed patent coronary arteries with nonobstructive CAD along with continued patency of prior mid LAD stent.  She was discharged home on Lovenox and brought back 10/11/2021 for direct admission prior to  TAVR.  Underwent successful TAVR with 23 mm Edwards SAPIEN 3 via transfemoral approach 10/14/2021.  Postprocedural echo showed LVEF of 25 to 30%, normal functioning TAVR valve with mean gradient of 10.6 mmHg and no post valvular leak as well as normal-appearing mechanical MVR, moderate to severe TR and moderate dilatation of the ascending aorta.  Last seen in the office 05/18/2022 with Dr. Dulce Sellar.  Reporting she was doing well overall.  She was continued on Coumadin with a goal INR of 3 which was managed by her PCP.  Presented to the ED on 10/24 after a fall from a chair.  Reports she was sitting at home in her recliner and her foot fell asleep.  Got up from her chair and fell.  Immediately developed right hip pain and could not get up from the floor.  EMS was called and she was transferred to the ED for further evaluation.  Labs in ED showed sodium 135 potassium 2.4, creatinine 1, magnesium 1.9, WBC 7.4, hemoglobin 12.7, INR 4.6.  EKG is V paced.  X-rays revealed right hip fracture.  Orthopedic surgery was consulted. Planned for surgery pending INR < 1.6. Cardiology asked to evaluate.   Past Medical History:  Diagnosis Date   Anxiety    Aortic aneurysm [facility])    Atrial arrhythmia    With prior history of atrial flutter ablation   Bradycardia    s/p pacemaker implantation   CONGESTIVE HEART FAILURE 07/31/2010   Qualifier: Diagnosis of  By: Graciela Husbands, MD, Susie Cassette    GERD (gastroesophageal reflux disease)    Hypertension  Hypothyroidism    Long Q-T syndrome    Myocardial infarct (HCC) 11/20/2011   Stent placement for 100% blockage of LAD   PACEMAKER, PERMANENT 2009   Medtronic Adapta serial # [id number]   Rheumatic heart disease    With mitral valve replacement, tricuspid regurgitation moderate to severe, significant left atrial enlargment   S/P TAVR (transcatheter aortic valve replacement) 10/14/2021   Edwards 73mm S3U via TF approach with Dr. Excell Seltzer and Dr. Laneta Simmers   TRICUSPID  REGURGITATION 12/23/2010   Qualifier: Diagnosis of  By: Graciela Husbands, MD, Susie Cassette    Unspecified essential hypertension 07/31/2010   Qualifier: Diagnosis of  By: Graciela Husbands, MD, [facility], Ty Hilts     Past Surgical History:  Procedure Laterality Date   APPENDECTOMY     CARDIAC ELECTROPHYSIOLOGY STUDY AND ABLATION     Stent and pacemaker placement   CHOLECYSTECTOMY     CORONARY ANGIOPLASTY WITH STENT PLACEMENT Left 09/13/2015   Dr. Josefa Half at [facility], LAD stent   INTRAOPERATIVE TRANSTHORACIC ECHOCARDIOGRAM N/A 10/14/2021   Procedure: INTRAOPERATIVE TRANSTHORACIC ECHOCARDIOGRAM;  Surgeon: Tonny Bollman, MD;  Location: [facility] OR;  Service: Open Heart Surgery;  Laterality: N/A;   MITRAL VALVE REPLACEMENT  2001   mechanical valve   OOPHORECTOMY     PACEMAKER INSERTION  11/20/2007   Dual chamber pacemaker implantation with rapid ventricular pacing   RIGHT/LEFT HEART CATH AND CORONARY ANGIOGRAPHY N/A 10/03/2021   Procedure: RIGHT/LEFT HEART CATH AND CORONARY ANGIOGRAPHY;  Surgeon: Tonny Bollman, MD;  Location: [facility] INVASIVE CV LAB;  Service: Cardiovascular;  Laterality: N/A;   SALPINGECTOMY Bilateral    TONSILLECTOMY     TRANSCATHETER AORTIC VALVE REPLACEMENT, TRANSFEMORAL N/A 10/14/2021   Procedure: TRANSCATHETER AORTIC VALVE REPLACEMENT, TRANSFEMORAL;  Surgeon: Tonny Bollman, MD;  Location: [facility] OR;  Service: Open Heart Surgery;  Laterality: N/A;     Home Medications:  Prior to Admission medications   Medication Sig Start Date End Date Taking? Authorizing Provider  Acetaminophen (TYLENOL PO) Take 1 tablet by mouth as needed (pain).   Yes [provider]  albuterol (VENTOLIN HFA) 108 (90 Base) MCG/ACT inhaler Inhale 2 puffs into the lungs as needed for shortness of breath. 03/21/14  Yes [provider]  alprazolam Prudy Feeler) 2 MG tablet Take 1.5 mg by mouth 2 (two) times daily as needed for anxiety. 07/20/22  Yes [provider]  aspirin 325 MG tablet Take 325 mg  by mouth daily as needed (back pain).   Yes [provider]  atorvastatin (LIPITOR) 80 MG tablet Take 80 mg by mouth at bedtime. 11/27/11  Yes [provider]  Camphor-Menthol-Methyl Sal (SALONPAS EX) Apply 1-2 patches topically daily as needed (pain).   Yes [provider]  cyclobenzaprine (FLEXERIL) 5 MG tablet Take 5 mg by mouth at bedtime as needed for muscle spasms. 07/08/22  Yes [provider]  diltiazem (DILACOR XR) 240 MG 24 hr capsule Take 240 mg by mouth daily as needed (tachycardia).   Yes [provider]  furosemide (LASIX) 20 MG tablet Take 30 mg by mouth daily.   Yes [provider]  hydrochlorothiazide (HYDRODIURIL) 25 MG tablet Take 25 mg by mouth daily.   Yes [provider]  levothyroxine (SYNTHROID) 75 MCG tablet Take 75 mcg by mouth daily before breakfast. 03/12/21 08/11/22 Yes [provider]  Menthol, Topical Analgesic, 2 % GEL Apply 1 Application topically as needed (pain).   Yes [provider]  nitroGLYCERIN (NITROSTAT) 0.4 MG SL tablet Place 1 tablet (0.4 mg  total) under the tongue every 5 (five) minutes as needed for chest pain. 05/18/22  Yes Baldo Daub, MD  pantoprazole (PROTONIX) 40 MG tablet Take 1 tablet (40 mg total) by mouth daily. 06/05/22  Yes Baldo Daub, MD  warfarin (COUMADIN) 5 MG tablet Take 7.5 mg by mouth every evening.   Yes [provider]  zolpidem (AMBIEN) 10 MG tablet Take 10 mg by mouth at bedtime as needed for sleep. 11/27/11  Yes [provider]  ALPRAZolam Prudy Feeler) 1 MG tablet Take 0.5-1 mg by mouth 2 (two) times daily as needed for anxiety. Patient not taking: Reported on 08/11/2022    [provider]  docusate sodium (COLACE) 100 MG capsule Take 1 capsule (100 mg total) by mouth 2 (two) times daily. Patient not taking: Reported on 08/11/2022 10/06/21   Swayze, Ava, DO  KLOR-CON M20 20 MEQ tablet Take 3 tablets (60 mEq total) by mouth See  admin instructions. Crush and mix 60 mEq with applesauce and eat once a day Patient not taking: Reported on 08/11/2022 12/11/21   Janetta Hora, PA-C  warfarin (COUMADIN) 7.5 MG tablet Take 1 tablet (7.5 mg total) by mouth daily at 4 PM. Patient not taking: Reported on 08/11/2022 10/17/21   Filbert Schilder, NP  furosemide (LASIX) 20 MG tablet Take 40 mg by mouth 2 (two) times daily. 09/21/21   [provider]    Inpatient Medications: Scheduled Meds:  heparin  2,500 Units Intravenous Once   lidocaine  1 patch Transdermal Q24H   morphine (PF)  4 mg Intravenous Once   senna-docusate  1 tablet Oral QHS   Continuous Infusions:  heparin 750 Units/hr (08/11/22 1509)   PRN Meds: acetaminophen **OR** acetaminophen, HYDROcodone-acetaminophen, HYDROmorphone (DILAUDID) injection, morphine injection, naLOXone (NARCAN)  injection, ondansetron (ZOFRAN) IV  Allergies:    Allergies  Allergen Reactions   Atenolol Other (See Comments)    HR and pulse dropped   Metoprolol Succinate [Metoprolol] Other (See Comments)    HR and pulse dropped   Baclofen Anxiety and Other (See Comments)    Makes the patient feel anxious the day after taking this. "Makes me jerk"    Latex Rash    Social History:   Social History   Socioeconomic History   Marital status: Single    Spouse name: Not on file   Number of children: Not on file   Years of education: Not on file   Highest education level: Not on file  Occupational History   Not on file  Tobacco Use   Smoking status: Never   Smokeless tobacco: Never  Substance and Sexual Activity   Alcohol use: Never   Drug use: Never   Sexual activity: Not on file  Other Topics Concern   Not on file  Social History Narrative   Not on file   Social Determinants of Health   Financial Resource Strain: Not on file  Food Insecurity: Not on file  Transportation Needs: Not on file  Physical Activity: Not on file  Stress: Not on file  Social  Connections: Not on file  Intimate Partner Violence: Not on file    Family History:    Family History  Problem Relation Age of Onset   Leukemia Mother    Stroke Mother    Heart disease Father    Stroke Maternal Grandmother    Heart attack Paternal Grandmother      ROS:  Please see the history of present illness.   All other  ROS reviewed and negative.     Physical Exam/Data:   Vitals:   08/11/22 1145 08/11/22 1200 08/11/22 1215 08/11/22 1334  BP: 131/76 127/75 124/73   Pulse: 64 65 67   Resp: 16 14 16    Temp:    98.5 F (36.9 C)  TempSrc:    Oral  SpO2: 99% 99% 97%   Weight:      Height:        Intake/Output Summary (Last 24 hours) at 08/11/2022 1513 Last data filed at 08/11/2022 0553 Gross per 24 hour  Intake 200 ml  Output --  Net 200 ml      08/11/2022    1:42 AM 05/18/2022    1:03 PM 12/10/2021    1:28 PM  Last 3 Weights  Weight (lbs) 115 lb 113 lb 103 lb  Weight (kg) 52.164 kg 51.256 kg 46.72 kg     Body mass index is 24.04 kg/m.  General:  Well nourished, well developed, in no acute distress HEENT: normal Neck: no JVD Vascular: No carotid bruits; Distal pulses 2+ bilaterally Cardiac:  normal S1, S2; RRR; 3/6 systolic murmur  along with mechanical valve click.  Lungs:  clear anteriorly Abd: soft, nontender, no hepatomegaly  Ext: no edema Musculoskeletal:  Right foot externally rotated Skin: warm and dry  Neuro:  CNs 2-12 intact, no focal abnormalities noted Psych:  Normal affect   EKG:  The EKG was personally reviewed and demonstrates:  V paced   Relevant CV Studies:  Cath: 09/2021    Mid LAD lesion is 20% stenosed.   1st Mrg lesion is 40% stenosed.   Prox RCA lesion is 30% stenosed.   Hemodynamic findings consistent with moderate pulmonary hypertension.   There is severe aortic valve stenosis.   1.  Patent coronary arteries with diffuse nonobstructive CAD, continued patency of the mid LAD stent 2.  Known severe aortic stenosis with  heavy calcification and restriction of the aortic valve leaflets 3.  Normal fluoroscopic motion of the patient's mechanical mitral prosthesis 4.  Moderate pulmonary hypertension with mean PA pressure 36 mmHg, transpulmonary gradient 13 mmHg, PVR 3.8 Wood units   Recommend: Resume warfarin today, start Lovenox tomorrow morning for bridging, tentatively plan TAVR 12/27 we will coordinate hospital admission for bridging into the procedure.  Medical therapy for nonobstructive CAD.  Continue IV diuresis today with elevated filling pressures.  Diagnostic Dominance: Right   Echo: 12/10/21  IMPRESSIONS     1. Left ventricular ejection fraction, by estimation, is 50 to 55%. The  left ventricle has low normal function. The left ventricle demonstrates  regional wall motion abnormalities (see scoring diagram/findings for  description). There is severe asymmetric   left ventricular hypertrophy of the basal-septal segment. Left  ventricular diastolic function could not be evaluated. Elevated left  ventricular end-diastolic pressure. There is dyskinesis of the left  ventricular, mid-apical septal wall.   2. Right ventricular systolic function is normal. The right ventricular  size is moderately enlarged. There is moderately elevated pulmonary artery  systolic pressure. The estimated right ventricular systolic pressure is  56.2 mmHg.   3. Left atrial size was severely dilated.   4. Right atrial size was moderately dilated.   5. The mitral valve has been repaired/replaced. No evidence of mitral  valve regurgitation. There is a 27 mm St. Jude mechanical valve present in  the mitral position.   6. Tricuspid valve regurgitation is severe.   7. The aortic valve has been repaired/replaced. Aortic valve  regurgitation is not visualized. There is a 23 mm Sapien prosthetic (TAVR)  valve present in the aortic position. Procedure Date: 10/14/2021. Aortic  valve mean gradient measures 9.6 mmHg.   8. Aortic  dilatation noted. There is mild dilatation of the ascending  aorta, measuring 40 mm.   9. The inferior vena cava is dilated in size with <50% respiratory  variability, suggesting right atrial pressure of 15 mmHg.   Comparison(s): Prior images reviewed side by side. Changes from prior  study are noted.   Conclusion(s)/Recommendation(s): Significantly improved LVEF compared to  prior study. Normal TAVR and mechanical MV. Current TAVR mean gradient 9.6  mmHg, prior 10.6 mmHg.   FINDINGS   Left Ventricle: Left ventricular ejection fraction, by estimation, is 50  to 55%. The left ventricle has low normal function. The left ventricle  demonstrates regional wall motion abnormalities. The left ventricular  internal cavity size was normal in  size. There is severe asymmetric left ventricular hypertrophy of the  basal-septal segment. Left ventricular diastolic function could not be  evaluated due to mitral valve replacement. Left ventricular diastolic  function could not be evaluated. Elevated  left ventricular end-diastolic pressure.   Right Ventricle: The right ventricular size is moderately enlarged. Right  vetricular wall thickness was not well visualized. Right ventricular  systolic function is normal. There is moderately elevated pulmonary artery  systolic pressure. The tricuspid  regurgitant velocity is 3.21 m/s, and with an assumed right atrial  pressure of 15 mmHg, the estimated right ventricular systolic pressure is  41.6 mmHg.   Left Atrium: Left atrial size was severely dilated.   Right Atrium: Right atrial size was moderately dilated.   Pericardium: There is no evidence of pericardial effusion.   Mitral Valve: The mitral valve has been repaired/replaced. No evidence of  mitral valve regurgitation. There is a 27 mm St. Jude mechanical valve  present in the mitral position. MV peak gradient, 12.4 mmHg. The mean  mitral valve gradient is 3.5 mmHg.   Tricuspid Valve: The  tricuspid valve is grossly normal. Tricuspid valve  regurgitation is severe. No evidence of tricuspid stenosis.   Aortic Valve: The aortic valve has been repaired/replaced. Aortic valve  regurgitation is not visualized. Aortic valve mean gradient measures 9.6  mmHg. Aortic valve peak gradient measures 16.6 mmHg. There is a 23 mm  Sapien prosthetic, stented (TAVR) valve   present in the aortic position. Procedure Date: 10/14/2021.   Pulmonic Valve: The pulmonic valve was grossly normal. Pulmonic valve  regurgitation is trivial. No evidence of pulmonic stenosis.   Aorta: Aortic dilatation noted. There is mild dilatation of the ascending  aorta, measuring 40 mm.   Venous: The inferior vena cava is dilated in size with less than 50%  respiratory variability, suggesting right atrial pressure of 15 mmHg.   IAS/Shunts: The interatrial septum was not well visualized.   Additional Comments: A device lead is visualized in the right atrium and  right ventricle.   Laboratory Data:  High Sensitivity Troponin:  No results for input(s): "TROPONINIHS" in the last 720 hours.   Chemistry Recent Labs  Lab 08/11/22 0144  NA 135  K 2.4*  CL 95*  CO2 24  GLUCOSE 120*  BUN 16  CREATININE 1.01*  CALCIUM 9.5  MG 1.9  GFRNONAA 58*  ANIONGAP 16*    Recent Labs  Lab 08/11/22 0144  PROT 7.4  ALBUMIN 4.0  AST 31  ALT 18  ALKPHOS 111  BILITOT 0.8   Lipids No  results for input(s): "CHOL", "TRIG", "HDL", "LABVLDL", "LDLCALC", "CHOLHDL" in the last 168 hours.  Hematology Recent Labs  Lab 08/11/22 0144  WBC 7.4  RBC 4.46  HGB 12.7  HCT 39.4  MCV 88.3  MCH 28.5  MCHC 32.2  RDW 15.4  PLT 152   Thyroid No results for input(s): "TSH", "FREET4" in the last 168 hours.  BNPNo results for input(s): "BNP", "PROBNP" in the last 168 hours.  DDimer No results for input(s): "DDIMER" in the last 168 hours.   Radiology/Studies:  DG Knee Complete 4 Views Right  Result Date:  08/11/2022 CLINICAL DATA:  Known right hip fracture, initial encounter EXAM: RIGHT KNEE - COMPLETE 4+ VIEW COMPARISON:  None Available. FINDINGS: No acute fracture or dislocation is noted. Multiple varicosities are noted along the lateral aspect of the thigh. IMPRESSION: No acute bony abnormality noted. Electronically Signed   By: Alcide Clever M.D.   On: 08/11/2022 02:15   DG Chest 1 View  Result Date: 08/11/2022 CLINICAL DATA:  Recent fall EXAM: CHEST  1 VIEW COMPARISON:  10/16/2021 FINDINGS: Cardiac shadow is enlarged. Postsurgical changes are noted. Pacing device is again seen. Aortic calcifications are noted. The lungs are well aerated without focal infiltrate or effusion. No bony abnormality is noted. IMPRESSION: No active disease. Electronically Signed   By: Alcide Clever M.D.   On: 08/11/2022 02:14   DG Hip Unilat W or Wo Pelvis 2-3 Views Right  Result Date: 08/11/2022 CLINICAL DATA:  Fall EXAM: DG HIP (WITH OR WITHOUT PELVIS) 2-3V RIGHT COMPARISON:  None Available. FINDINGS: Minimally displaced fracture of the right femoral neck. No other pelvic fracture. The right femoral head remains located within the acetabulum. IMPRESSION: Minimally displaced fracture of the right femoral neck. Electronically Signed   By: Deatra Robinson M.D.   On: 08/11/2022 02:14     Assessment and Plan:   DELONNA NEY is a 76 y.o. female with a hx of rheumatic fever with rheumatic heart disease s/p mechanical MVR, critical aortic stenosis s/p TAVR, paroxysmal atrial fibrillation on Coumadin, bradycardia status post PPM placement, CAD s/p PCI of LAD, COPD who is being seen 08/11/2022 for the evaluation of preop evaluation at the request of Dr. Katrinka Blazing.  Right hip fracture Preoperative evaluation -- Presented after a fall from her recliner.  Has been seen by orthopedics with plans for surgery once INR less than 1.6. -- no anginal or HF symptoms PTA, CXR without edema -- do not anticipate further cardiac work up  prior to surgery  HFimEF -- Echocardiogram 09/2021 with LVEF of 25 to 30%, repeat echo 12/10/2021 with improved EF to 50 to 55%, severe asymmetric LVH, normal RV function moderate enlargement with moderately dilated right atrium and severely dilated left atrium -- Does not appear volume overloaded on exam, will need to monitor closely in the perioperative setting   Critical AS s/p TAVR -- Echocardiogram 11/2021 with normal functioning TAVR with mean gradient 9.6 mmHg  Rheumatic heart disease s/p mechanical MVR -- At [facility] 20 years ago -- INR 4.6 on admission, received vitamin K/2 units of FFP, now 1.8 -- goal INR 3 once resumed -- currently on IV heparin  CAD s/p LAD stent -- last cath 12/22 with patent LAD stent and mild nonobstructive CAD -- no anginal symptoms -- continue medical management   Paroxysmal atrial fibrillation -- as above coumadin held with plans for surgery -- IV heparin   Bradycardia s/p dual chamber PPM -- V pacing intermittently   Hypokalemia --  K+ 2.4, supplemented with improvement  Risk Assessment/Risk Scores:    CHA2DS2-VASc Score = 5  This indicates a 7.2% annual risk of stroke. The patient's score is based upon: CHF History: 1 HTN History: 1 Diabetes History: 0 Stroke History: 0 Vascular Disease History: 1 Age Score: 1 Gender Score: 1   For questions or updates, please contact [facility] Please consult [email or web address] for contact info under    Signed, Laverda Page, NP  08/11/2022 3:13 PM   Patient seen and examined. Agree with assessment and plan. [name] [name] is a very pleasant 76 year old female who has a history of rheumatic heart disease and underwent mechanical mitral valve replacement approximately 20 years ago at [facility].  She has been on long-term Coumadin therapy.  In 2009 she required permanent pacemaker for some significant bradycardia.  She has known CAD and underwent remote PCI to her LAD.  She  developed progressive aortic valve disease on echocardiography in October 2022 showed reduced EF at 30 to 35% with critical aortic stenosis with a mean gradient of 53 mm with mild to moderate AR.  She underwent successful TAVR on October 14, 2021 with a 23 mm Edwards SAPIEN 3 valve via the transfemoral approach.  Subsequent echocardiography in February 2023 showed improvement of LV function to EF of 50 to 55% with asymmetric LVH, and biatrial enlargement left greater than right.  Subsequently, she has done well.  Unfortunately today she fell when she tried to get up from chair and her foot had fallen asleep.  She was found to have minimally displaced fracture of the right femoral neck with the right femoral head remaining located within the acetabulum.  Presently she is without chest pain or shortness of breath.  Cardiac rhythm is stable with sinus rhythm with an occasional PAC.  HEENT is unremarkable.  JVD is 78 cm.  Lungs are clear.  Rhythm is regular with 2/6 systolic murmur in the aortic and mitral positions.  He has no S3 gallop.  Abdomen is soft nontender.  He does have right lower extremity varicosities and bilateral venous stasis changes without significant edema.  Laboratories notable for potassium 2.4 which is being supplemented.  Repeat was 3.5.  Magnesium 1.9.  BUN 16 creatinine 1.01.  CBC stable with hemoglobin 12.7 hematocrit 39.4.  Initial INR was supratherapeutic at 4.6.  She has received vitamin K with subsequent INR 1.8.  She is now on IV heparin.  TSH is elevated at 10.38 consistent with hypothyroidism.  Chest x-ray is unremarkable.  At present, patient appears cardiovascularly stable to undergo planned orthopedic surgery.  We will recheck electrolytes potassium and magnesium in a.m.  Warfarin has been on.  With her most recent echo Doppler study showing essentially normalization of LV function, at this point I do not feel a repeat 2D echo Doppler study is necessary prior to her surgery.  I  suspect her preoperative severely reduced LV function was a result of her previous critical aortic stenosis which improved following TAVR.  We will check ECG in a.m.  If remains stable okay for surgery tomorrow if INR appropriate.   Lennette Bihari, MD, [facility] 08/11/2022 5:15 PM

## 2022-08-11 NOTE — Progress Notes (Signed)
Patient discussed with EDP, imaging reviewed.  R displaced femoral neck fx.  On warfarin, no INR yet.  - NWB RLE - Medically appropriate pain control - Hold AC, normalize INR - Admit to medicine - Preop clearance, risk stratification, optimization - NPO pending possible OR; okay for diet if INR elevated and unable to get <1.6 today  Full consult note to follow.  Georgeanna Harrison M.D. Orthopaedic Surgery [facility]

## 2022-08-11 NOTE — H&P (Signed)
History and Physical    Patient: [name] [name] UUV:[id number] DOB: [birth year] DOA: 08/11/2022 DOS: the patient was seen and examined on 08/11/2022 PCP: Ronita Hipps, MD  Patient coming from: [facility]  Chief Complaint:  Chief Complaint  Patient presents with   Hip Pain   HPI: [name] [name] is a 76 y.o. female with medical history significant of hypertension, hyperlipidemia, paroxysmal atrial fibrillation on anticoagulation warfarin, severe aortic stenosis s/p TAVR in 09/2021, congestive heart failure last EF 50-55%, s/p mechanical mitral valve, s/p pacemaker, hypothyroidism, AAA, and GERD presents after having a fall.  She had been sitting in her recliner at home with her legs up.  She she tried to get up her right leg was numb and gave out on her causing her to fall.  She did not lose consciousness or hit her head but reported significant pain in the right leg and knee.  She was unable to get up or bear weight on the affected extremity.  Patient noted prior to this she had been living at home alone and has been able to complete all of her ADLs without need of assistance.  Daughter makes note of patient becoming agitated and combative possibly from taking home medications in addition to what was given to her in the hospital during one of her prior hospitalizations.  Upon admission into the emergency department patient was noted to be afebrile with pulse 56-82, respirations 13-30, and O2 saturations currently maintained on 2 L of nasal cannula oxygen.  Patient was not documented to be hypoxic.  Labs significant for potassium 2.4 and INR 4.6.  X-rays of the hip revealed a minimally displaced fracture of the right femoral neck. Dr. Mable Fill of orthopedics had been consulted and recommended reversal of INR for surgery. Patient had been ordered 3 mg of vitamin K and 2 units of FFP.  Review of Systems: As mentioned in the history of present illness. All other systems reviewed and  are negative. Past Medical History:  Diagnosis Date   Anxiety    Aortic aneurysm [facility])    Atrial arrhythmia    With prior history of atrial flutter ablation   Bradycardia    s/p pacemaker implantation   CONGESTIVE HEART FAILURE 07/31/2010   Qualifier: Diagnosis of  By: Caryl Comes, MD, Remus Blake    GERD (gastroesophageal reflux disease)    Hypertension    Hypothyroidism    Long Q-T syndrome    Myocardial infarct ([place]) 11/20/2011   Stent placement for 100% blockage of LAD   PACEMAKER, PERMANENT 2009   Medtronic Adapta serial # [id number]   Rheumatic heart disease    With mitral valve replacement, tricuspid regurgitation moderate to severe, significant left atrial enlargment   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 Burt Knack and Dr. Cyndia Bent   TRICUSPID REGURGITATION 12/23/2010   Qualifier: Diagnosis of  By: Caryl Comes, MD, Remus Blake    Unspecified essential hypertension 07/31/2010   Qualifier: Diagnosis of  By: Caryl Comes, MD, [facility], Mack Guise    Past Surgical History:  Procedure Laterality Date   APPENDECTOMY     CARDIAC ELECTROPHYSIOLOGY STUDY AND ABLATION     Stent and pacemaker placement   CHOLECYSTECTOMY     CORONARY ANGIOPLASTY WITH STENT PLACEMENT Left 09/13/2015   Dr. Gwenith Spitz at [facility] [place] stent   INTRAOPERATIVE TRANSTHORACIC ECHOCARDIOGRAM N/A 10/14/2021   Procedure: INTRAOPERATIVE TRANSTHORACIC ECHOCARDIOGRAM;  Surgeon: Sherren Mocha, MD;  Location: [facility]  OR;  Service: Open Heart Surgery;  Laterality: N/A;   MITRAL VALVE REPLACEMENT  2001   mechanical valve   OOPHORECTOMY     PACEMAKER INSERTION  11/20/2007   Dual chamber pacemaker implantation with rapid ventricular pacing   RIGHT/LEFT HEART CATH AND CORONARY ANGIOGRAPHY N/A 10/03/2021   Procedure: RIGHT/LEFT HEART CATH AND CORONARY ANGIOGRAPHY;  Surgeon: Tonny Bollman, MD;  Location: [facility] INVASIVE CV LAB;  Service: Cardiovascular;   Laterality: N/A;   SALPINGECTOMY Bilateral    TONSILLECTOMY     TRANSCATHETER AORTIC VALVE REPLACEMENT, TRANSFEMORAL N/A 10/14/2021   Procedure: TRANSCATHETER AORTIC VALVE REPLACEMENT, TRANSFEMORAL;  Surgeon: Tonny Bollman, MD;  Location: [facility] OR;  Service: Open Heart Surgery;  Laterality: N/A;   Social History:  reports that she has never smoked. She has never used smokeless tobacco. She reports that she does not drink alcohol and does not use drugs.  Allergies  Allergen Reactions   Atenolol Other (See Comments)    HR and pulse dropped   Metoprolol Succinate [Metoprolol] Other (See Comments)    HR and pulse dropped   Baclofen Anxiety and Other (See Comments)    Makes the patient feel anxious the day after taking this   Latex Rash    Family History  Problem Relation Age of Onset   Leukemia Mother    Stroke Mother    Heart disease Father    Stroke Maternal Grandmother    Heart attack Paternal Grandmother     Prior to Admission medications   Medication Sig Start Date End Date Taking? Authorizing Provider  pantoprazole (PROTONIX) 40 MG tablet Take 1 tablet (40 mg total) by mouth daily. 06/05/22   Baldo Daub, MD  albuterol (VENTOLIN HFA) 108 (90 Base) MCG/ACT inhaler Inhale 2 puffs into the lungs every 6 (six) hours as needed for shortness of breath. 03/21/14   [provider]  ALPRAZolam Prudy Feeler) 1 MG tablet Take 0.5-1 mg by mouth 2 (two) times daily as needed for anxiety.    [provider]  atorvastatin (LIPITOR) 80 MG tablet Take 80 mg by mouth at bedtime. 11/27/11   [provider]  DILT-XR 240 MG 24 hr capsule Take 240 mg by mouth 2 (two) times daily. 10/21/21   [provider]  diltiazem (DILACOR XR) 240 MG 24 hr capsule Take 240 mg by mouth daily as needed (tachycardia).    [provider]  docusate sodium (COLACE) 100 MG capsule Take 1 capsule (100 mg total) by mouth 2 (two) times daily. 10/06/21   Swayze, Ava, DO  KLOR-CON M20 20  MEQ tablet Take 3 tablets (60 mEq total) by mouth See admin instructions. Crush and mix 60 mEq with applesauce and eat once a day 12/11/21   Janetta Hora, PA-C  levothyroxine (SYNTHROID) 75 MCG tablet Take 75 mcg by mouth daily before breakfast. 03/12/21 05/18/22  [provider]  nitroGLYCERIN (NITROSTAT) 0.4 MG SL tablet Place 1 tablet (0.4 mg total) under the tongue every 5 (five) minutes as needed for chest pain. 05/18/22   Baldo Daub, MD  warfarin (COUMADIN) 7.5 MG tablet Take 1 tablet (7.5 mg total) by mouth daily at 4 PM. 10/17/21   Filbert Schilder, NP  zolpidem (AMBIEN) 10 MG tablet Take 5-10 mg by mouth at bedtime. 11/27/11   [provider]  furosemide (LASIX) 20 MG tablet Take 40 mg by mouth 2 (two) times daily. 09/21/21   [provider]    Physical Exam: Vitals:   08/11/22 1610  08/11/22 0557 08/11/22 0600 08/11/22 0612  BP: 129/78 129/78 (!) 140/91 136/75  Pulse: 78 71 64 71  Resp: 15 19 15 18   Temp: 98.1 F (36.7 C) 98.1 F (36.7 C)  97.8 F (36.6 C)  TempSrc: Oral Oral  Oral  SpO2: 98% 98% 99% 99%  Weight:      Height:       Exam  Constitutional: Elderly female currently in no acute distress Eyes: PERRL, lids and conjunctivae normal ENMT: Mucous membranes are moist. Posterior pharynx clear of any exudate or lesions.  Neck: normal, supple, no masses, no thyromegaly Respiratory: clear to auscultation bilaterally, no wheezing, no crackles.  Cardiovascular: irregular irregular.  No significant lower extremity swelling at this time. Abdomen: no tenderness, no masses palpated. No hepatosplenomegaly. Bowel sounds positive.  Musculoskeletal: no clubbing / cyanosis.  TTP of the right hip. Skin: no rashes, lesions, ulcers. No induration.  Neurologic: CN 2-12 grossly intact. Strength 5/5 in all 4.  Psychiatric: Normal judgment and insight. Alert and oriented x 3.  Anxious mood.   Data Reviewed:  EKG revealed atrial fibrillation at 82 bpm  with RBBB and LAFB.  Assessment and Plan: Right femoral neck fracture secondary to fall Patient presents after having a fall at home after her leg went to sleep while sitting in her recliner.  Noted to have a femoral neck fracture.  Orthopedics consulted and Dr.  plans on taking her to the operating room for surgical repair in a.m. -Admit to medical telemetry bed -Hip fracture order set utilized -N.p.o. after midnight -Hydrocodone/morphine IV as needed for moderate to severe pain respectively -Appreciate orthopedics consulted, will follow-up for any further recommendations  Supratherapeutic INR paroxysmal atrial fibrillation history of mechanical mitral valve replacement Patient appears to be in atrial fibrillation.  CHA2DS2-VASc score = 5. INR 4.6.  Patient had been ordered 3 mg of vitamin K and 2 units of FFP initially due to need of going to surgery.  She has a mechanical heart valve for which her goal INR is supposed to be 3. -Heparin per pharmacy -Continue Cardizem as needed -Goal potassium at least 4 and magnesium at least 2 -Will need bridging back on the Coumadin after the procedure has been completed  Hypokalemia Acute.  Initial potassium noted to be 2.4.  Patient had been given 40 mEq IV potassium chloride. -Give additional potassium chloride 40 meq p.o. 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  Patient appears to be currently even hemic.  Last echocardiogram from 11/2021 noted EF to be 50 to 55% with indeterminate diastolic parameters. -Strict I&Os and daily weights  Hypothyroidism -Add on TSH -Continue levothyroxine  Severe aortic stenosis s/p TAVR Status post surgical repair 09/2021  S/p cardiac pacemaker  Anxiety -Continue Xanax as needed  Advance Care Planning:   Code Status: Full Code confirmed with the patient at bedside.   Consults: Orthopedics  Family Communication: Daughter updated  over the phone  Severity of Illness: The appropriate patient status for this patient is INPATIENT. Inpatient status is judged to be reasonable and necessary in order to provide the required intensity of service to ensure the patient's safety. The patient's presenting symptoms, physical exam findings, and initial radiographic and laboratory data in the context of their chronic comorbidities is felt to place them at high risk for further clinical deterioration. Furthermore, it is not anticipated that the patient will be medically stable for discharge from the hospital within 2 midnights of  admission.   * I certify that at the point of admission it is my clinical judgment that the patient will require inpatient hospital care spanning beyond 2 midnights from the point of admission due to high intensity of service, high risk for further deterioration and high frequency of surveillance required.*  Author: Clydie Braun, MD 08/11/2022 7:15 AM  For on call review [email or web address].

## 2022-08-11 NOTE — H&P (View-Only) (Signed)
Reason for Consult:Right hip fx Referring Physician: Fuller Plan Time called: 0730 Time at bedside: [name] is an 76 y.o. female.  HPI: [name] [name] was at home sitting in her recliner and didn't realize her foot had fallen asleep. She got up from the chair and fell. She had immediate right hip pain and could not get up. She was brought to the ED where x-rays showed a right hip fx and orthopedic surgery was consulted. She lives alone and does not use any assistive devices to ambulate.  Past Medical History:  Diagnosis Date   Anxiety    Aortic aneurysm [facility])    Atrial arrhythmia    With prior history of atrial flutter ablation   Bradycardia    s/p pacemaker implantation   CONGESTIVE HEART FAILURE 07/31/2010   Qualifier: Diagnosis of  By: Caryl Comes, MD, Remus Blake    GERD (gastroesophageal reflux disease)    Hypertension    Hypothyroidism    Long Q-T syndrome    Myocardial infarct ([place]) 11/20/2011   Stent placement for 100% blockage of LAD   PACEMAKER, PERMANENT 2009   Medtronic Adapta serial # [id number]   Rheumatic heart disease    With mitral valve replacement, tricuspid regurgitation moderate to severe, significant left atrial enlargment   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 Burt Knack and Dr. Cyndia Bent   TRICUSPID REGURGITATION 12/23/2010   Qualifier: Diagnosis of  By: Caryl Comes, MD, Remus Blake    Unspecified essential hypertension 07/31/2010   Qualifier: Diagnosis of  By: Caryl Comes, MD, [facility], Mack Guise     Past Surgical History:  Procedure Laterality Date   APPENDECTOMY     CARDIAC ELECTROPHYSIOLOGY STUDY AND ABLATION     Stent and pacemaker placement   CHOLECYSTECTOMY     CORONARY ANGIOPLASTY WITH STENT PLACEMENT Left 09/13/2015   Dr. Gwenith Spitz at [facility], [place] stent   INTRAOPERATIVE TRANSTHORACIC ECHOCARDIOGRAM N/A 10/14/2021   Procedure: INTRAOPERATIVE TRANSTHORACIC  ECHOCARDIOGRAM;  Surgeon: Sherren Mocha, MD;  Location: [place];  Service: Open Heart Surgery;  Laterality: N/A;   MITRAL VALVE REPLACEMENT  2001   mechanical valve   OOPHORECTOMY     PACEMAKER INSERTION  11/20/2007   Dual chamber pacemaker implantation with rapid ventricular pacing   RIGHT/LEFT HEART CATH AND CORONARY ANGIOGRAPHY N/A 10/03/2021   Procedure: RIGHT/LEFT HEART CATH AND CORONARY ANGIOGRAPHY;  Surgeon: Sherren Mocha, MD;  Location: [place] CV LAB;  Service: Cardiovascular;  Laterality: N/A;   SALPINGECTOMY Bilateral    TONSILLECTOMY     TRANSCATHETER AORTIC VALVE REPLACEMENT, TRANSFEMORAL N/A 10/14/2021   Procedure: TRANSCATHETER AORTIC VALVE REPLACEMENT, TRANSFEMORAL;  Surgeon: Sherren Mocha, MD;  Location: [place];  Service: Open Heart Surgery;  Laterality: N/A;    Family History  Problem Relation Age of Onset   Leukemia Mother    Stroke Mother    Heart disease Father    Stroke Maternal Grandmother    Heart attack Paternal Grandmother     Social History:  reports that she has never smoked. She has never used smokeless tobacco. She reports that she does not drink alcohol and does not use drugs.  Allergies:  Allergies  Allergen Reactions   Atenolol Other (See Comments)    HR and pulse dropped   Metoprolol Succinate [Metoprolol] Other (See Comments)    HR and pulse dropped   Baclofen Anxiety and Other (See Comments)    Makes the patient feel anxious the  day after taking this. "Makes me jerk"    Latex Rash    Medications: I have reviewed the patient's current medications.  Results for orders placed or performed during the hospital encounter of 08/11/22 (from the past 48 hour(s))  CBC with Differential     Status: None   Collection Time: 08/11/22  1:44 AM  Result Value Ref Range   WBC 7.4 4.0 - 10.5 K/uL   RBC 4.46 3.87 - 5.11 MIL/uL   Hemoglobin 12.7 12.0 - 15.0 g/dL   HCT 32.9 51.8 - 84.1 %   MCV 88.3 80.0 - 100.0 fL   MCH 28.5 26.0 - 34.0 pg   MCHC  32.2 30.0 - 36.0 g/dL   RDW 66.0 63.0 - 16.0 %   Platelets 152 150 - 400 K/uL   nRBC 0.0 0.0 - 0.2 %   Neutrophils Relative % 83 %   Neutro Abs 6.2 1.7 - 7.7 K/uL   Lymphocytes Relative 9 %   Lymphs Abs 0.7 0.7 - 4.0 K/uL   Monocytes Relative 7 %   Monocytes Absolute 0.5 0.1 - 1.0 K/uL   Eosinophils Relative 0 %   Eosinophils Absolute 0.0 0.0 - 0.5 K/uL   Basophils Relative 0 %   Basophils Absolute 0.0 0.0 - 0.1 K/uL   Immature Granulocytes 1 %   Abs Immature Granulocytes 0.04 0.00 - 0.07 K/uL    Comment: Performed at [facility], [address], [place], [place] [address]  Comprehensive metabolic panel     Status: Abnormal   Collection Time: 08/11/22  1:44 AM  Result Value Ref Range   Sodium 135 135 - 145 mmol/L   Potassium 2.4 (LL) 3.5 - 5.1 mmol/L    Comment: CRITICAL RESULT CALLED TO, READ BACK BY AND VERIFIED WITH L. SANDERS, PAC, 0241 08/11/22, A. RAMSEY   Chloride 95 (L) 98 - 111 mmol/L   CO2 24 22 - 32 mmol/L   Glucose, Bld 120 (H) 70 - 99 mg/dL    Comment: Glucose reference range applies only to samples taken after fasting for at least 8 hours.   BUN 16 8 - 23 mg/dL   Creatinine, Ser 3.55 (H) 0.44 - 1.00 mg/dL   Calcium 9.5 8.9 - 73.2 mg/dL   Total Protein 7.4 6.5 - 8.1 g/dL   Albumin 4.0 3.5 - 5.0 g/dL   AST 31 15 - 41 U/L   ALT 18 0 - 44 U/L   Alkaline Phosphatase 111 38 - 126 U/L   Total Bilirubin 0.8 0.3 - 1.2 mg/dL   GFR, Estimated 58 (L) >60 mL/min    Comment: (NOTE) Calculated using the CKD-EPI Creatinine Equation (2021)    Anion gap 16 (H) 5 - 15    Comment: Electrolytes repeated to confirm. Performed at [facility], [address] [place], [place] [address]   Protime-INR     Status: Abnormal   Collection Time: 08/11/22  1:44 AM  Result Value Ref Range   Prothrombin Time 42.9 (H) 11.4 - 15.2 seconds   INR 4.6 (HH) 0.8 - 1.2    Comment: REPEATED TO VERIFY CRITICAL RESULT CALLED TO, READ BACK BY AND VERIFIED WITH:  Riki Altes, RN, [phone],  08/11/22, EADEDOKUN (NOTE) INR goal varies based on device and disease states. Performed at [facility], [address] [place], [place] [address]   Type and screen     Status: None   Collection Time: 08/11/22  1:44 AM  Result Value Ref  Range   ABO/RH(D) O POS    Antibody Screen NEG    Sample Expiration      08/14/2022,2359 Performed at [facility], [address], [place], [place] [address]   Magnesium     Status: None   Collection Time: 08/11/22  1:44 AM  Result Value Ref Range   Magnesium 1.9 1.7 - 2.4 mg/dL    Comment: Performed at [facility], [address] [place], [place] [address]  Prepare fresh frozen plasma     Status: None (Preliminary result)   Collection Time: 08/11/22  3:17 AM  Result Value Ref Range   Unit Number [id number]    Blood Component Type THW PLS APHR    Unit division 00    Status of Unit ISSUED    Transfusion Status      OK TO TRANSFUSE Performed at [facility], [address] [place], [place] [address]    Unit Number [id number]    Blood Component Type THW PLS APHR    Unit division 00    Status of Unit ISSUED    Transfusion Status OK TO TRANSFUSE     DG Knee Complete 4 Views Right  Result Date: 08/11/2022 CLINICAL DATA:  Known right hip fracture, initial encounter EXAM: RIGHT KNEE - COMPLETE 4+ VIEW COMPARISON:  None Available. FINDINGS: No acute fracture or dislocation is noted. Multiple varicosities are noted along the lateral aspect of the thigh. IMPRESSION: No acute bony abnormality noted. Electronically Signed   By: Alcide Clever M.D.   On: 08/11/2022 02:15   DG Chest 1 View  Result Date: 08/11/2022 CLINICAL DATA:  Recent fall EXAM: CHEST  1 VIEW COMPARISON:  10/16/2021 FINDINGS: Cardiac shadow is enlarged. Postsurgical changes are noted. Pacing device is again seen. Aortic calcifications are noted. The lungs are well aerated without focal infiltrate or effusion. No bony abnormality is noted. IMPRESSION:  No active disease. Electronically Signed   By: Alcide Clever M.D.   On: 08/11/2022 02:14   DG Hip Unilat W or Wo Pelvis 2-3 Views Right  Result Date: 08/11/2022 CLINICAL DATA:  Fall EXAM: DG HIP (WITH OR WITHOUT PELVIS) 2-3V RIGHT COMPARISON:  None Available. FINDINGS: Minimally displaced fracture of the right femoral neck. No other pelvic fracture. The right femoral head remains located within the acetabulum. IMPRESSION: Minimally displaced fracture of the right femoral neck. Electronically Signed   By: Deatra Robinson M.D.   On: 08/11/2022 02:14    Review of Systems  HENT:  Negative for ear discharge, ear pain, hearing loss and tinnitus.   Eyes:  Negative for photophobia and pain.  Respiratory:  Negative for cough and shortness of breath.   Cardiovascular:  Negative for chest pain.  Gastrointestinal:  Negative for abdominal pain, nausea and vomiting.  Genitourinary:  Negative for dysuria, flank pain, frequency and urgency.  Musculoskeletal:  Positive for arthralgias (Right hip). Negative for back pain, myalgias and neck pain.  Neurological:  Negative for dizziness and headaches.  Hematological:  Does not bruise/bleed easily.  Psychiatric/Behavioral:  The patient is not nervous/anxious.    Blood pressure 134/87, pulse 65, temperature 98.7 F (37.1 C), temperature source Oral, resp. rate 13, height 4\' 10"  (1.473 m), weight 52.2 kg, SpO2 100 %. Physical Exam Constitutional:      General: She is not in acute distress.    Appearance: She is well-developed. She is not diaphoretic.  HENT:     Head: Normocephalic and atraumatic.  Eyes:  General: No scleral icterus.       Right eye: No discharge.        Left eye: No discharge.     Conjunctiva/sclera: Conjunctivae normal.  Cardiovascular:     Rate and Rhythm: Normal rate and regular rhythm.  Pulmonary:     Effort: Pulmonary effort is normal. No respiratory distress.  Musculoskeletal:     Cervical back: Normal range of motion.      Comments: LLE No traumatic wounds, ecchymosis, or rash  Mod TTP hip  No knee or ankle effusion  Knee stable to varus/ valgus and anterior/posterior stress  Sens DPN, SPN, TN intact  Motor EHL, ext, flex, evers 5/5  DP 1+, PT 2+, No significant edema  Skin:    General: Skin is warm and dry.  Neurological:     Mental Status: She is alert.  Psychiatric:        Mood and Affect: Mood normal.        Behavior: Behavior normal.    Assessment/Plan: Right hip fx -- Plan hip hemi vs THA once INR normalized and cardiac clearance obtained. Tentatively scheduled for tomorrow with Dr. Blanchie Dessert. Multiple medical problems including hypertension, hyperlipidemia, paroxysmal atrial fibrillation on anticoagulation warfarin, severe aortic stenosis s/p TAVR, congestive heart failure last EF 50-55%, s/p pacemaker, hypothyroidism, AAA, and GERD -- per primary service.   Freeman Caldron, PA-C Orthopedic Surgery [phone] 08/11/2022, 9:55 AM

## 2022-08-12 ENCOUNTER — Encounter (HOSPITAL_COMMUNITY)
Admission: EM | Disposition: A | Discharge status: Skilled Nursing Facility | Payer: Self-pay | Source: Home / Self Care | Attending: Family Medicine

## 2022-08-12 ENCOUNTER — Other Ambulatory Visit: Payer: Self-pay

## 2022-08-12 ENCOUNTER — Encounter (HOSPITAL_COMMUNITY): Payer: Self-pay | Admitting: Internal Medicine

## 2022-08-12 ENCOUNTER — Inpatient Hospital Stay (HOSPITAL_COMMUNITY): Payer: Medicare Other | Admitting: Anesthesiology

## 2022-08-12 ENCOUNTER — Inpatient Hospital Stay (HOSPITAL_COMMUNITY): Payer: Medicare Other

## 2022-08-12 ENCOUNTER — Other Ambulatory Visit (HOSPITAL_COMMUNITY): Payer: Self-pay

## 2022-08-12 ENCOUNTER — Telehealth (HOSPITAL_COMMUNITY): Payer: Self-pay | Admitting: Pharmacy Technician

## 2022-08-12 DIAGNOSIS — I10 Essential (primary) hypertension: Secondary | ICD-10-CM

## 2022-08-12 DIAGNOSIS — E876 Hypokalemia: Secondary | ICD-10-CM | POA: Diagnosis not present

## 2022-08-12 DIAGNOSIS — I2510 Atherosclerotic heart disease of native coronary artery without angina pectoris: Secondary | ICD-10-CM

## 2022-08-12 DIAGNOSIS — S72001A Fracture of unspecified part of neck of right femur, initial encounter for closed fracture: Secondary | ICD-10-CM

## 2022-08-12 DIAGNOSIS — S72001D Fracture of unspecified part of neck of right femur, subsequent encounter for closed fracture with routine healing: Secondary | ICD-10-CM

## 2022-08-12 DIAGNOSIS — I252 Old myocardial infarction: Secondary | ICD-10-CM

## 2022-08-12 HISTORY — PX: TOTAL HIP ARTHROPLASTY: SHX124

## 2022-08-12 LAB — CBC
HCT: 37.7 % (ref 36.0–46.0)
Hemoglobin: 12.1 g/dL (ref 12.0–15.0)
MCH: 28.4 pg (ref 26.0–34.0)
MCHC: 32.1 g/dL (ref 30.0–36.0)
MCV: 88.5 fL (ref 80.0–100.0)
Platelets: 117 10*3/uL — ABNORMAL LOW (ref 150–400)
RBC: 4.26 MIL/uL (ref 3.87–5.11)
RDW: 15.7 % — ABNORMAL HIGH (ref 11.5–15.5)
WBC: 6.8 10*3/uL (ref 4.0–10.5)
nRBC: 0 % (ref 0.0–0.2)

## 2022-08-12 LAB — BPAM FFP
Blood Product Expiration Date: 202310272359
Blood Product Expiration Date: 202310272359
ISSUE DATE / TIME: 202310240342
ISSUE DATE / TIME: 202310240549
Unit Type and Rh: 5100
Unit Type and Rh: 5100

## 2022-08-12 LAB — HEPARIN LEVEL (UNFRACTIONATED): Heparin Unfractionated: 0.4 IU/mL (ref 0.30–0.70)

## 2022-08-12 LAB — PREPARE FRESH FROZEN PLASMA
Unit division: 0
Unit division: 0

## 2022-08-12 LAB — BASIC METABOLIC PANEL
Anion gap: 12 (ref 5–15)
BUN: 14 mg/dL (ref 8–23)
CO2: 29 mmol/L (ref 22–32)
Calcium: 8.8 mg/dL — ABNORMAL LOW (ref 8.9–10.3)
Chloride: 94 mmol/L — ABNORMAL LOW (ref 98–111)
Creatinine, Ser: 0.87 mg/dL (ref 0.44–1.00)
GFR, Estimated: >60 mL/min (ref >60)
Glucose, Bld: 119 mg/dL — ABNORMAL HIGH (ref 70–99)
Potassium: 3.3 mmol/L — ABNORMAL LOW (ref 3.5–5.1)
Sodium: 135 mmol/L (ref 135–145)

## 2022-08-12 LAB — PROTIME-INR
INR: 1.4 — ABNORMAL HIGH (ref 0.8–1.2)
Prothrombin Time: 16.8 seconds — ABNORMAL HIGH (ref 11.4–15.2)

## 2022-08-12 SURGERY — ARTHROPLASTY, HIP, TOTAL,POSTERIOR APPROACH
Anesthesia: General | Site: Hip | Laterality: Right

## 2022-08-12 MED ORDER — DEXAMETHASONE SODIUM PHOSPHATE 10 MG/ML IJ SOLN
INTRAMUSCULAR | Status: AC
  Filled 2022-08-12: qty 1

## 2022-08-12 MED ORDER — ROCURONIUM BROMIDE 10 MG/ML (PF) SYRINGE
PREFILLED_SYRINGE | INTRAVENOUS | Status: DC | PRN
  Administered 2022-08-12: 30 mg via INTRAVENOUS
  Administered 2022-08-12: 10 mg via INTRAVENOUS
  Administered 2022-08-12: 60 mg via INTRAVENOUS

## 2022-08-12 MED ORDER — SUGAMMADEX SODIUM 200 MG/2ML IV SOLN
INTRAVENOUS | Status: DC | PRN
  Administered 2022-08-12: 150 mg via INTRAVENOUS

## 2022-08-12 MED ORDER — ORAL CARE MOUTH RINSE: 15.0000 mL | Freq: Once | OROMUCOSAL | Status: DC

## 2022-08-12 MED ORDER — ENOXAPARIN SODIUM 40 MG/0.4ML IJ SOSY
40.0000 mg | PREFILLED_SYRINGE | INTRAMUSCULAR | Status: DC
  Administered 2022-08-13: 40 mg via SUBCUTANEOUS
  Filled 2022-08-12: qty 0.4

## 2022-08-12 MED ORDER — PROPOFOL 10 MG/ML IV BOLUS
INTRAVENOUS | Status: AC
  Filled 2022-08-12: qty 20

## 2022-08-12 MED ORDER — ENSURE ENLIVE PO LIQD
237.0000 mL | Freq: Two times a day (BID) | ORAL | Status: DC
  Administered 2022-08-13 – 2022-08-18 (×6): 237 mL via ORAL
  Filled 2022-08-12: qty 237

## 2022-08-12 MED ORDER — ONDANSETRON HCL 4 MG/2ML IJ SOLN
INTRAMUSCULAR | Status: AC
  Filled 2022-08-12: qty 2

## 2022-08-12 MED ORDER — ROCURONIUM BROMIDE 10 MG/ML (PF) SYRINGE
PREFILLED_SYRINGE | INTRAVENOUS | Status: AC
  Filled 2022-08-12: qty 10

## 2022-08-12 MED ORDER — ALBUTEROL SULFATE HFA 108 (90 BASE) MCG/ACT IN AERS
INHALATION_SPRAY | RESPIRATORY_TRACT | Status: DC | PRN
  Administered 2022-08-12: 2 via RESPIRATORY_TRACT

## 2022-08-12 MED ORDER — SURGIRINSE WOUND IRRIGATION SYSTEM - OPTIME: TOPICAL | Status: DC | PRN

## 2022-08-12 MED ORDER — TRANEXAMIC ACID 1000 MG/10ML IV SOLN
2000.0000 mg | Freq: Once | INTRAVENOUS | Status: AC
  Administered 2022-08-12: 2000 mg via TOPICAL
  Filled 2022-08-12: qty 20

## 2022-08-12 MED ORDER — SODIUM CHLORIDE 0.9 % IV SOLN: INTRAVENOUS | Status: AC

## 2022-08-12 MED ORDER — CHLORHEXIDINE GLUCONATE 0.12 % MT SOLN: 15.0000 mL | Freq: Once | OROMUCOSAL | Status: DC

## 2022-08-12 MED ORDER — FENTANYL CITRATE (PF) 100 MCG/2ML IJ SOLN
INTRAMUSCULAR | Status: DC | PRN
  Administered 2022-08-12 (×2): 50 ug via INTRAVENOUS

## 2022-08-12 MED ORDER — WATER FOR IRRIGATION, STERILE IR SOLN
Status: DC | PRN
  Administered 2022-08-12: 2000 mL

## 2022-08-12 MED ORDER — LIDOCAINE 2% (20 MG/ML) 5 ML SYRINGE
INTRAMUSCULAR | Status: DC | PRN
  Administered 2022-08-12: 100 mg via INTRAVENOUS

## 2022-08-12 MED ORDER — ONDANSETRON HCL 4 MG/2ML IJ SOLN
INTRAMUSCULAR | Status: DC | PRN
  Administered 2022-08-12: 4 mg via INTRAVENOUS

## 2022-08-12 MED ORDER — FENTANYL CITRATE (PF) 100 MCG/2ML IJ SOLN: 25.0000 ug | INTRAMUSCULAR | Status: DC | PRN

## 2022-08-12 MED ORDER — SODIUM CHLORIDE (PF) 0.9 % IJ SOLN
INTRAMUSCULAR | Status: DC | PRN
  Administered 2022-08-12: 80 mL

## 2022-08-12 MED ORDER — PHENYLEPHRINE 80 MCG/ML (10ML) SYRINGE FOR IV PUSH (FOR BLOOD PRESSURE SUPPORT)
PREFILLED_SYRINGE | INTRAVENOUS | Status: AC
  Filled 2022-08-12: qty 10

## 2022-08-12 MED ORDER — LACTATED RINGERS IV SOLN: INTRAVENOUS | Status: DC

## 2022-08-12 MED ORDER — OXYCODONE HCL 5 MG PO TABS
5.0000 mg | ORAL_TABLET | ORAL | Status: DC | PRN
  Administered 2022-08-12 – 2022-08-17 (×8): 5 mg via ORAL
  Administered 2022-08-17: 10 mg via ORAL
  Administered 2022-08-17: 5 mg via ORAL
  Administered 2022-08-18 (×2): 10 mg via ORAL
  Filled 2022-08-12: qty 1
  Filled 2022-08-12: qty 2
  Filled 2022-08-12 (×6): qty 1
  Filled 2022-08-12: qty 2
  Filled 2022-08-12: qty 1
  Filled 2022-08-12: qty 2
  Filled 2022-08-12 (×2): qty 1

## 2022-08-12 MED ORDER — ONDANSETRON HCL 4 MG/2ML IJ SOLN: 4.0000 mg | Freq: Once | INTRAMUSCULAR | Status: DC | PRN

## 2022-08-12 MED ORDER — OXYCODONE HCL 5 MG PO TABS: 5.0000 mg | ORAL_TABLET | Freq: Once | ORAL | Status: DC | PRN

## 2022-08-12 MED ORDER — CEFAZOLIN SODIUM-DEXTROSE 2-4 GM/100ML-% IV SOLN
2.0000 g | Freq: Three times a day (TID) | INTRAVENOUS | Status: AC
  Administered 2022-08-12 – 2022-08-13 (×2): 2 g via INTRAVENOUS
  Filled 2022-08-12 (×2): qty 100

## 2022-08-12 MED ORDER — LIDOCAINE 2% (20 MG/ML) 5 ML SYRINGE
INTRAMUSCULAR | Status: AC
  Filled 2022-08-12: qty 5

## 2022-08-12 MED ORDER — OXYCODONE HCL 5 MG/5ML PO SOLN: 5.0000 mg | Freq: Once | ORAL | Status: DC | PRN

## 2022-08-12 MED ORDER — SODIUM CHLORIDE 0.9 % IR SOLN
Status: DC | PRN
  Administered 2022-08-12: 1000 mL

## 2022-08-12 MED ORDER — FENTANYL CITRATE (PF) 250 MCG/5ML IJ SOLN
INTRAMUSCULAR | Status: AC
  Filled 2022-08-12: qty 5

## 2022-08-12 MED ORDER — 0.9 % SODIUM CHLORIDE (POUR BTL) OPTIME
TOPICAL | Status: DC | PRN
  Administered 2022-08-12: 1000 mL

## 2022-08-12 MED ORDER — PROPOFOL 10 MG/ML IV BOLUS
INTRAVENOUS | Status: DC | PRN
  Administered 2022-08-12: 80 mg via INTRAVENOUS

## 2022-08-12 MED ORDER — DEXAMETHASONE SODIUM PHOSPHATE 10 MG/ML IJ SOLN
INTRAMUSCULAR | Status: DC | PRN
  Administered 2022-08-12: 5 mg via INTRAVENOUS

## 2022-08-12 MED ORDER — BUPIVACAINE LIPOSOME 1.3 % IJ SUSP
INTRAMUSCULAR | Status: AC
  Filled 2022-08-12: qty 20

## 2022-08-12 MED ORDER — CHLORHEXIDINE GLUCONATE 0.12 % MT SOLN
OROMUCOSAL | Status: AC
  Filled 2022-08-12: qty 15

## 2022-08-12 MED ORDER — POTASSIUM CHLORIDE 10 MEQ/100ML IV SOLN
10.0000 meq | Freq: Once | INTRAVENOUS | Status: AC
  Administered 2022-08-12: 10 meq via INTRAVENOUS
  Filled 2022-08-12: qty 100

## 2022-08-12 MED ORDER — ALBUTEROL SULFATE HFA 108 (90 BASE) MCG/ACT IN AERS
INHALATION_SPRAY | RESPIRATORY_TRACT | Status: AC
  Filled 2022-08-12: qty 6.7

## 2022-08-12 SURGICAL SUPPLY — 60 items
BLADE SAGITTAL 25.0X1.27X90 (BLADE) ×1 IMPLANT
BRUSH FEMORAL CANAL (MISCELLANEOUS) IMPLANT
CHLORAPREP W/TINT 26 (MISCELLANEOUS) ×2 IMPLANT
COVER SURGICAL LIGHT HANDLE (MISCELLANEOUS) ×1 IMPLANT
DERMABOND ADVANCED .7 DNX12 (GAUZE/BANDAGES/DRESSINGS) IMPLANT
DRAPE HALF SHEET 40X57 (DRAPES) ×1 IMPLANT
DRAPE HIP W/POCKET STRL (MISCELLANEOUS) ×1 IMPLANT
DRAPE INCISE IOBAN 66X45 STRL (DRAPES) ×1 IMPLANT
DRAPE INCISE IOBAN 85X60 (DRAPES) ×2 IMPLANT
DRAPE POUCH INSTRU U-SHP 10X18 (DRAPES) ×1 IMPLANT
DRAPE U-SHAPE 47X51 STRL (DRAPES) ×2 IMPLANT
DRSG AQUACEL AG ADV 3.5X10 (GAUZE/BANDAGES/DRESSINGS) ×1 IMPLANT
ELECT BLADE 4.0 EZ CLEAN MEGAD (MISCELLANEOUS) ×1
ELECTRODE BLDE 4.0 EZ CLN MEGD (MISCELLANEOUS) ×1 IMPLANT
GLOVE SRG 8 PF TXTR STRL LF DI (GLOVE) ×1 IMPLANT
GLOVE SURG ORTHO 8.0 STRL STRW (GLOVE) ×2 IMPLANT
GLOVE SURG UNDER POLY LF SZ8 (GLOVE) ×1
GOWN STRL REUS W/ TWL LRG LVL3 (GOWN DISPOSABLE) ×2 IMPLANT
GOWN STRL REUS W/ TWL XL LVL3 (GOWN DISPOSABLE) ×1 IMPLANT
GOWN STRL REUS W/TWL LRG LVL3 (GOWN DISPOSABLE) ×2
GOWN STRL REUS W/TWL XL LVL3 (GOWN DISPOSABLE) ×1
HANDPIECE INTERPULSE COAX TIP (DISPOSABLE) ×1
HEAD CERAMIC FEMORAL 36MM (Head) IMPLANT
HOOD PEEL AWAY FLYTE STAYCOOL (MISCELLANEOUS) ×3 IMPLANT
INSERT 0 DEGREE 36 (Miscellaneous) IMPLANT
IRRIGATION SURGIPHOR STRL (IV SOLUTION) ×1 IMPLANT
KIT BASIN OR (CUSTOM PROCEDURE TRAY) ×1 IMPLANT
KIT TURNOVER KIT A (KITS) ×1 IMPLANT
MANIFOLD NEPTUNE II (INSTRUMENTS) ×1 IMPLANT
MARKER SKIN DUAL TIP RULER LAB (MISCELLANEOUS) ×1 IMPLANT
NDL 18GX1X1/2 (RX/OR ONLY) (NEEDLE) ×1 IMPLANT
NEEDLE 18GX1X1/2 (RX/OR ONLY) (NEEDLE) ×1 IMPLANT
NS IRRIG 1000ML POUR BTL (IV SOLUTION) ×1 IMPLANT
PACK TOTAL JOINT (CUSTOM PROCEDURE TRAY) ×1 IMPLANT
PRESSURIZER FEMORAL UNIV (MISCELLANEOUS) IMPLANT
RETRIEVER SUT HEWSON (MISCELLANEOUS) ×1 IMPLANT
SCREW HEX LP 6.5X15 (Screw) IMPLANT
SCREW HEX LP 6.5X25 (Screw) IMPLANT
SEALER BIPOLAR AQUA 6.0 (INSTRUMENTS) ×1 IMPLANT
SET HNDPC FAN SPRY TIP SCT (DISPOSABLE) ×1 IMPLANT
SHELL ACETAB TRIDENT 48 (Shell) IMPLANT
SOLUTION IRRIG SURGIPHOR (IV SOLUTION) IMPLANT
SPONGE T-LAP 18X18 ~~LOC~~+RFID (SPONGE) ×2 IMPLANT
STEM HIP 127 DEG (Stem) IMPLANT
SUCTION FRAZIER HANDLE 10FR (MISCELLANEOUS) ×1
SUCTION TUBE FRAZIER 10FR DISP (MISCELLANEOUS) ×1 IMPLANT
SUT BONE WAX W31G (SUTURE) ×1 IMPLANT
SUT ETHIBOND 2 V 37 (SUTURE) ×1 IMPLANT
SUT MNCRL AB 3-0 PS2 18 (SUTURE) ×1 IMPLANT
SUT STRATAFIX 1PDS 45CM VIOLET (SUTURE) ×2 IMPLANT
SUT VIC AB 0 CT1 27 (SUTURE)
SUT VIC AB 0 CT1 27XBRD ANBCTR (SUTURE) ×1 IMPLANT
SUT VIC AB 2-0 CT2 27 (SUTURE) ×2 IMPLANT
SYR 20ML LL LF (SYRINGE) ×1 IMPLANT
SYR 50ML LL SCALE MARK (SYRINGE) ×1 IMPLANT
TOWEL GREEN STERILE (TOWEL DISPOSABLE) ×1 IMPLANT
TOWER CARTRIDGE SMART MIX (DISPOSABLE) IMPLANT
TRAY FOLEY MTR SLVR 16FR STAT (SET/KITS/TRAYS/PACK) ×1 IMPLANT
TUBE SUCT ARGYLE STRL (TUBING) ×1 IMPLANT
WATER STERILE IRR 1000ML POUR (IV SOLUTION) ×1 IMPLANT

## 2022-08-12 NOTE — TOC Benefit Eligibility Note (Signed)
Patient Teacher, English as a foreign language completed.    The patient is currently admitted and upon discharge could be taking enoxaparin (Lovenox) 40 mg/0.4 ml.  The current 5 day co-pay is $4.15.   The patient is insured through [place], [place] Patient Advocate Specialist [place] Patient Advocate Team Direct Number: [phone]  Fax: [phone]

## 2022-08-12 NOTE — Anesthesia Postprocedure Evaluation (Signed)
Anesthesia Post Note  Patient: [name] [name]  Procedure(s) Performed: RIGHT TOTAL HIP ARTHROPLASTY (Right: Hip)     Patient location during evaluation: PACU Anesthesia Type: General Level of consciousness: awake and alert Pain management: pain level controlled Vital Signs Assessment: post-procedure vital signs reviewed and stable Respiratory status: spontaneous breathing, nonlabored ventilation, respiratory function stable and patient connected to nasal cannula oxygen Cardiovascular status: blood pressure returned to baseline and stable Postop Assessment: no apparent nausea or vomiting Anesthetic complications: no  No notable events documented.  Last Vitals:  Vitals:   08/12/22 1316 08/12/22 1706  BP: 139/74 114/64  Pulse: 68 65  Resp: (!) 27 13  Temp: 37.1 C 36.5 C  SpO2: 96% 93%    Last Pain:  Vitals:   08/12/22 1706  TempSrc:   PainSc: Asleep                 Sadye Kiernan S

## 2022-08-12 NOTE — Progress Notes (Signed)
[facility]  PROGRESS NOTE  [name] [name]:[id number] DOB: 1946/08/07 DOA: 08/11/2022 PCP: Ronita Hipps, MD   Brief HPI:   76 year old female with medical history of hypertension, hyperlipidemia, paroxysmal atrial fibrillation on anticoagulation with warfarin, severe leukocytosis s/p TAVR in 2022, CHF with last EF of 50 to 55%, s/p mechanical mitral valve, status post pacemaker, hypothyroidism, AAA, GERD presented after having fall at home.  In the ED x-ray of the hip revealed minimally displaced fracture of right femoral neck.  Orthopedics was consulted.  Patient found to have elevated INR, vitamin K 3 mg and 2 units FFP ordered.    Subjective   Patient seen and examined, complains of pain.  No other complaints.   Assessment/Plan:     Right femoral neck fracture; secondary to mechanical fall -Hip fracture protocol initiated -Continue hydrocodone/morphine as needed -Orthopedics consulted -Plan for surgery today  Chronic systolic heart failure/critical AS s/p TAVR/rheumatic heart disease s/p mechanical MVR -Cardiology was consulted for preop evaluation -Continue home medication including -Follow recommendations per cardiology  Hypothyroidism -Continue levothyroxine  Severe aortic stenosis s/p TAVR -S/p surgical repair in 12/22  Hypokalemia -Potassium is 3.3 this morning -We will replace potassium with IV KCl 10 meq x 1 -Follow BMP in am    Medications     atorvastatin  80 mg Oral QHS   chlorhexidine  60 mL Topical Once   furosemide  30 mg Oral Daily   hydrochlorothiazide  25 mg Oral Daily   levothyroxine  75 mcg Oral QAC breakfast   lidocaine  1 patch Transdermal Q24H   morphine (PF)  4 mg Intravenous Once   povidone-iodine  2 Application Topical Once   senna-docusate  1 tablet Oral QHS     Data Reviewed:   CBG:  No results for input(s): "GLUCAP" in the last 168 hours.  SpO2: 93 % O2 Flow Rate (L/min): 2 L/min    Vitals:   08/11/22  1818 08/11/22 1927 08/11/22 2020 08/12/22 0700  BP: 138/67 126/72 121/77 112/67  Pulse: 65 67 73   Resp: 19 17 20    Temp: 98.9 F (37.2 C) 98.2 F (36.8 C)  99.7 F (37.6 C)  TempSrc: Oral   Oral  SpO2: 96% 95% 93%   Weight:      Height:          Data Reviewed:  Basic Metabolic Panel: Recent Labs  Lab 08/11/22 0144 08/11/22 1507 08/12/22 0411  NA 135 136 135  K 2.4* 3.5 3.3*  CL 95* 98 94*  CO2 24 29 29   GLUCOSE 120* 132* 119*  BUN 16 13 14   CREATININE 1.01* 0.83 0.87  CALCIUM 9.5 8.7* 8.8*  MG 1.9  --   --     CBC: Recent Labs  Lab 08/11/22 0144 08/12/22 0411  WBC 7.4 6.8  NEUTROABS 6.2  --   HGB 12.7 12.1  HCT 39.4 37.7  MCV 88.3 88.5  PLT 152 117*    LFT Recent Labs  Lab 08/11/22 0144  AST 31  ALT 18  ALKPHOS 111  BILITOT 0.8  PROT 7.4  ALBUMIN 4.0     Antibiotics: Anti-infectives (From admission, onward)    Start     Dose/Rate Route Frequency Ordered Stop   08/12/22 0600  ceFAZolin (ANCEF) IVPB 2g/100 mL premix        2 g 200 mL/hr over 30 Minutes Intravenous On call to O.R. 08/11/22 2215 08/13/22 0559        DVT prophylaxis: As  per orthopedics  Code Status: Full code  Family Communication: No family at bedside   CONSULTS orthopedics, cardiology   Objective    Physical Examination:   General: Appears in mild respiratory distress Cardiovascular: S1-S2, regular, no murmur auscultated Respiratory: Clear to auscultation bilaterally Abdomen: Abdomen soft, nontender, no organomegaly Extremities: No edema in the lower extremities Neurologic: Cranial nerves II through XII grossly intact, moving all extremities   Status is: Inpatient:             Meredeth Ide   [facility] If 7PM-7AM, please contact night-coverage at [email or web address], Office  [phone]   08/12/2022, 9:16 AM  LOS: 1 day

## 2022-08-12 NOTE — Progress Notes (Signed)
[facility] for Heparin Indication: atrial fibrillation and mitral valve Brief A/P: Heparin level within goal range Continue Heparin at current rate   Allergies  Allergen Reactions   Atenolol Other (See Comments)    HR and pulse dropped   Metoprolol Succinate [Metoprolol] Other (See Comments)    HR and pulse dropped   Baclofen Anxiety and Other (See Comments)    Makes the patient feel anxious the day after taking this. "Makes me jerk"    Latex Rash    Patient Measurements: Height: 4\' 10"  (147.3 cm) Weight: 52.2 kg (115 lb) IBW/kg (Calculated) : 40.9 Heparin Dosing Weight: 51.4 kg  Vital Signs: Temp: 98.2 F (36.8 C) (10/24 1927) Temp Source: Oral (10/24 1818) BP: 121/77 (10/24 2020) Pulse Rate: 73 (10/24 2020)  Labs: Recent Labs    08/11/22 0144 08/11/22 1018 08/11/22 1507 08/12/22 0411  HGB 12.7  --   --  12.1  HCT 39.4  --   --  37.7  PLT 152  --   --  117*  LABPROT 42.9* 21.0*  --   --   INR 4.6* 1.8*  --   --   HEPARINUNFRC  --   --   --  0.40  CREATININE 1.01*  --  0.83 0.87     Estimated Creatinine Clearance: 40 mL/min (by C-G formula based on SCr of 0.87 mg/dL).  Assessment: 76 y.o. female with h/o Afib and mechanical MVR, Coumadin on hold, for heparin   Goal of Therapy:  Heparin level 0.3-0.7 units/ml Monitor platelets by anticoagulation protocol: Yes   Plan:  Continue Heparin at current rate   Phillis Knack, PharmD, BCPS  08/12/2022,4:56 AM

## 2022-08-12 NOTE — Anesthesia Procedure Notes (Signed)
Procedure Name: Intubation Date/Time: 08/12/2022 2:54 PM  Performed by: Barrington Ellison, CRNAPre-anesthesia Checklist: Patient identified, Emergency Drugs available, Suction available and Patient being monitored Patient Re-evaluated:Patient Re-evaluated prior to induction Oxygen Delivery Method: Circle System Utilized Preoxygenation: Pre-oxygenation with 100% oxygen Induction Type: IV induction Ventilation: Mask ventilation without difficulty Laryngoscope Size: Mac and 3 Grade View: Grade I Tube type: Oral Tube size: 7.0 mm Number of attempts: 1 Airway Equipment and Method: Stylet and Oral airway Placement Confirmation: ETT inserted through vocal cords under direct vision, positive ETCO2 and breath sounds checked- equal and bilateral Secured at: 21 cm Tube secured with: Tape Dental Injury: Teeth and Oropharynx as per pre-operative assessment

## 2022-08-12 NOTE — Op Note (Addendum)
08/12/2022  4:16 PM  PATIENT:  [name] [name]   MRN: [id number]  PRE-OPERATIVE DIAGNOSIS: Right displaced femoral neck fracture  POST-OPERATIVE DIAGNOSIS:  same  PROCEDURE:  Procedure(s): RIGHT TOTAL HIP ARTHROPLASTY  PREOPERATIVE INDICATIONS:    GREG ECKRICH is an 76 y.o. female who has a diagnosis of Right displaced femoral neck fracture and elected for surgical management after failing conservative treatment.  The risks benefits and alternatives were discussed with the patient including but not limited to the risks of nonoperative treatment, versus surgical intervention including infection, bleeding, nerve injury, periprosthetic fracture, the need for revision surgery, dislocation, leg length discrepancy, blood clots, cardiopulmonary complications, morbidity, mortality, among others, and they were willing to proceed.     OPERATIVE REPORT     SURGEON:  Charlies Constable, MD    ASSISTANT: Izola Price, RNFA, (Present throughout the entire procedure,  necessary for completion of procedure in a timely manner, assisting with retraction, instrumentation, and closure)     ANESTHESIA: General  ESTIMATED BLOOD LOSS: 250cc, no concerns intraoperatively for excessive bleeding or oozing given her anticoagulation status.  1 g IV TXA used preincision and 2 g topical TXA used at the end of the case out of abundance of caution.    COMPLICATIONS:  None.     COMPONENTS:   Stryker Trident 248 mm acetabular shell, Accolade 2 with 127 degree neck angle size #5, 36+0 ceramic head, neutral polyliner, 6.5 hex screws x2 Implant Name Type Inv. Item Serial No. Manufacturer Lot No. LRB No. Used Action  SHELL ACETAB TRIDENT 48 - [id number] Shell SHELL ACETAB TRIDENT 48  [employer] [id number] A Right 1 Implanted  SCREW HEX LP 6.5X25 - [id number] Screw SCREW HEX LP 6.5X25  [employer] [id number] Right 1 Implanted  INSERT 0 DEGREE 36 - [id number] Miscellaneous INSERT 0 DEGREE 36  [employer]  [employer] [id number] Right 1 Implanted  SCREW HEX LP 6.5X15 - [id number] Screw SCREW HEX LP 6.5X15  [employer] U2 Right 1 Implanted  STEM HIP 127 DEG - [id number] Stem STEM HIP 127 DEG  [employer] [id number] A Right 1 Implanted  HEAD CERAMIC FEMORAL 36MM - [id number] Head HEAD CERAMIC FEMORAL 36MM  [employer] [id number] Right 1 Implanted      PROCEDURE IN DETAIL:   The patient was met in the holding area and  identified.  The appropriate hip was identified and marked at the operative site.  The patient was then transported to the OR  and  placed under anesthesia.  At that point, the patient was  placed in the lateral decubitus position with the operative side up and  secured to the operating room table  and all bony prominences padded. A subaxillary role was also placed.    The operative lower extremity was prepped from the iliac crest to the distal leg.  Sterile draping was performed.  Preoperative antibiotics, 2 gm of ancef,1 gm of Tranexamic Acid, and 8 mg of Decadron administered. Time out was performed prior to incision.      A routine posterolateral approach was utilized via sharp dissection  carried down to the subcutaneous tissue.  Gross bleeders were Bovie coagulated.  The iliotibial band was identified and incised along the length of the skin incision through the glute max fascia.  Charnley retractor was placed with care to protect the sciatic nerve posteriorly.  With the hip internally rotated, the piriformis tendon was identified and released from the femoral insertion and tagged with a #5 Ethibond.  A capsulotomy was then performed  off the femoral insertion and also tagged with a #5 Ethibond.    The femoral neck was exposed, and I resected the femoral neck below the level of the fracture measuring about 15 mm above the lesser trochanter.  Femoral head was removed using the corkscrew without difficulty.  Femoral head measured 43 mm in diameter.    I then exposed the  deep acetabulum, cleared out any tissue including the ligamentum teres.  After adequate visualization, I excised the labrum.  I then started reaming with a 44 mm reamer, first medializing to the floor of the cotyloid fossa, and then in the position of the cup aiming towards the greater sciatic notch, matching the version of the transverse acetabular ligament and tucked under the anterior wall. I reamed up to 48 mm reamer with good bony bed preparation and a 48 mm cup was chosen.  The real cup was then impacted into place.  Appropriate version and inclination was confirmed clinically matching their bony anatomy, and also with the use of the jig.  I placed 2 screws in the posterior superior quadrant to augment fixation.  A netural liner was placed and impacted. It was confirmed to be appropriately seated and the acetabular retractors were removed.    I then prepared the proximal femur using the box cutter, Charnley awl, and then sequentially broached starting with 0 up to a size 4.  A trial broach, neck, and head was utilized, and I reduced the hip and it was found to have excellent stability.  There was no impingement with full extension and 90 degrees external rotation.  The hip was stable at the position of sleep and with 90 degrees flexion and 90 degrees of internal rotation.  Leg lengths were also clinically assessed in the lateral position and felt to to be slightly long.   Intra-Op flatplate was obtained and demonstrated that we had a relatively long neck cut.  Elected to broach with the size 3 and then the size 4 which was countersunk.  Calcar planer was used to bring the femoral neck down about 3 to 4 mm.  We were then able to further lateralize and seat the size 5 broach at the level of the neck cut.  The hip was then trialed with a 36+0 trial ball.  Leg lengths at this point were felt to be clinically equal.   A final femoral prosthesis size 5 was selected. I then impacted the real femoral  prosthesis into place.I again trialed and selected a 36+ 0 mm ball. The hip was then reduced and taken through a range of motion. There was no impingement with full extension and 90 degrees external rotation.  The hip was stable at the position of sleep and with 90 degrees flexion and 90degrees of internal rotation. Leg lengths were  again assessed and felt to be restored.  We then opened, and I impacted the real head ball into place.  The posterior capsule was then closed with #5 Ethibond.  The piriformis was repaired through the base of the abductor tendon using a [place] suture passer.  I then irrigated the hip copiously with dilute Betadine and with normal saline pulse lavage. 2g of topical TXA were placed into the wound. Periarticular injection was then performed with Exparel.   We repaired the fascia #1 barbed suture, followed by 0 barbed suture for the subcutaneous fat.  Skin was closed with 2-0 Vicryl and 3-0 Monocryl.  Dermabond and Aquacel dressing were applied. The patient was then  awakened and returned to PACU in stable and satisfactory condition.  Leg lengths in the supine position were assessed and felt to be clinically equal. There were no complications.  Post op recs: WB: WBAT RLE, No formal hip precautions Abx: ancef Imaging: PACU pelvis Xray Dressing: Aquacell, keep intact until follow up DVT prophylaxis: Discussed with cardiology team well resume Coumadin postoperatively and bridged with Lovenox 40 daily starting postop day 1 until she is therapeutic on the Coumadin. Follow up: 2 weeks after surgery for a wound check with Dr. Zachery Dakins at [facility].  Address: [address], [place], [place] [address]  Office Phone: [phone]   Charlies Constable, MD Orthopedic Surgeon

## 2022-08-12 NOTE — Progress Notes (Signed)
Initial Nutrition Assessment  DOCUMENTATION CODES:   Not applicable  INTERVENTION:  - Add Ensure Enlive po BID, each supplement provides 350 kcal and 20 grams of protein.  NUTRITION DIAGNOSIS:   Increased nutrient needs related to hip fracture as evidenced by estimated needs.  GOAL:   Patient will meet greater than or equal to 90% of their needs  MONITOR:   PO intake, Supplement acceptance  REASON FOR ASSESSMENT:   Consult Assessment of nutrition requirement/status, Hip fracture protocol  ASSESSMENT:   76 y.o. female admits related to hip pain. PMH includes: HTN, HLD, afib, aortic stenosis, CHF, AAA, GERD. Pt is currently receiving medical management related to right femoral neck fracture, secondary to mechanical fall.   Meds include: lasix, senokot. Labs reviewed: K low.   The pt is currently NPO for surgery. Pt reports that she has a good appetite and was eating well PTA. She states that she has even experienced some weight gain. Wts are stable per record. RD discussed the importance of adequate protein and calories in setting of fracture. Pt agreed to Ensure BID. RD will add supplements and continue to monitor PO intakes.   NUTRITION - FOCUSED PHYSICAL EXAM:  Flowsheet Row Most Recent Value  Orbital Region No depletion  Upper Arm Region Mild depletion  Thoracic and Lumbar Region No depletion  Buccal Region No depletion  Temple Region Mild depletion  Clavicle Bone Region Mild depletion  Clavicle and Acromion Bone Region No depletion  Scapular Bone Region No depletion  Dorsal Hand Mild depletion  Patellar Region No depletion  Anterior Thigh Region No depletion  Posterior Calf Region No depletion  Edema (RD Assessment) None  Hair Reviewed  Eyes Reviewed  Mouth Reviewed  Skin Reviewed  Nails Reviewed       Diet Order:   Diet Order             Diet Heart Room service appropriate? Yes; Fluid consistency: Thin  Diet effective now                    EDUCATION NEEDS:   Not appropriate for education at this time  Skin:  Skin Assessment: Reviewed RN Assessment  Last BM:  08/09/22  Height:   Ht Readings from Last 1 Encounters:  08/11/22 4\' 10"  (1.473 m)    Weight:   Wt Readings from Last 1 Encounters:  08/11/22 52.2 kg    Ideal Body Weight:  40.9 kg  BMI:  Body mass index is 24.04 kg/m.  Estimated Nutritional Needs:   Kcal:  2841-3244 kcals  Protein:  75-90 gm  Fluid:  1565-1825 mL  Thalia Bloodgood, RD, LDN, CNSC

## 2022-08-12 NOTE — Interval H&P Note (Signed)
The patient has been re-examined, and the chart reviewed, and there have been no interval changes to the documented history and physical.    Plan for R THA for femoral neck fracture.  Patient NPO and heparin infusion has been held.  The operative side was examined and the patient was confirmed to have sensation to DPN, SPN, TN intact, Motor EHL, ext, flex 5/5, and DP 2+, PT 2+, No significant edema.  The risks, benefits, and alternatives have been discussed at length with patient, and the patient is willing to proceed.  Right hip marked. Consent has been signed.

## 2022-08-12 NOTE — Telephone Encounter (Signed)
Pharmacy Patient Advocate Encounter  Insurance verification completed.    The patient is insured through [employer] Medicare Part D   The patient is currently admitted and ran test claims for the following: enoxaparin (Lovenox).  Copays and coinsurance results were relayed to Inpatient clinical team.  

## 2022-08-12 NOTE — Anesthesia Preprocedure Evaluation (Addendum)
Anesthesia Evaluation  Patient identified by MRN, date of birth, ID band Patient awake    Reviewed: Allergy & Precautions, H&P , NPO status , Patient's Chart, lab work & pertinent test results  Airway Mallampati: II  TM Distance: >3 FB Neck ROM: Full    Dental no notable dental hx.    Pulmonary neg pulmonary ROS   Pulmonary exam normal breath sounds clear to auscultation       Cardiovascular hypertension, + CAD, + Past MI and + Cardiac Stents  + dysrhythmias Atrial Fibrillation + pacemaker  Rhythm:Regular Rate:Normal + Systolic murmurs 76-year-old female with medical history of hypertension, hyperlipidemia, paroxysmal atrial fibrillation on anticoagulation with warfarin, severe leukocytosis s/p TAVR in 2022, CHF with last EF of 50 to 55%, s/p mechanical mitral valve, status post pacemaker,    Neuro/Psych   Anxiety     negative neurological ROS  negative psych ROS   GI/Hepatic negative GI ROS, Neg liver ROS,,,  Endo/Other  Hypothyroidism    Renal/GU negative Renal ROS  negative genitourinary   Musculoskeletal negative musculoskeletal ROS (+)    Abdominal   Peds negative pediatric ROS (+)  Hematology negative hematology ROS (+) Chronic warfarin use INR today 1.4   Anesthesia Other Findings   Reproductive/Obstetrics negative OB ROS                             Anesthesia Physical Anesthesia Plan  ASA: 4  Anesthesia Plan: General   Post-op Pain Management: Ofirmev IV (intra-op)*   Induction: Intravenous  PONV Risk Score and Plan: 3 and Ondansetron, Dexamethasone and Treatment may vary due to age or medical condition  Airway Management Planned: Oral ETT  Additional Equipment:   Intra-op Plan:   Post-operative Plan: Extubation in OR  Informed Consent: I have reviewed the patients History and Physical, chart, labs and discussed the procedure including the risks, benefits and  alternatives for the proposed anesthesia with the patient or authorized representative who has indicated his/her understanding and acceptance.     Dental advisory given  Plan Discussed with: CRNA and Surgeon  Anesthesia Plan Comments: (Etomidate for induction)       Anesthesia Quick Evaluation

## 2022-08-12 NOTE — Transfer of Care (Signed)
Immediate Anesthesia Transfer of Care Note  Patient: [name] [name]  Procedure(s) Performed: RIGHT TOTAL HIP ARTHROPLASTY (Right: Hip)  Patient Location: PACU  Anesthesia Type:General  Level of Consciousness: drowsy and patient cooperative  Airway & Oxygen Therapy: Patient Spontanous Breathing and Patient connected to nasal cannula oxygen  Post-op Assessment: Report given to RN  Post vital signs: Reviewed and stable  Last Vitals:  Vitals Value Taken Time  BP 114/64   Temp    Pulse 65 08/12/22 1706  Resp 13 08/12/22 1706  SpO2 93 % 08/12/22 1706  Vitals shown include unvalidated device data.  Last Pain:  Vitals:   08/12/22 1316  TempSrc: Oral  PainSc: 5       Patients Stated Pain Goal: 0 (29/24/46 2863)  Complications: No notable events documented.

## 2022-08-13 DIAGNOSIS — Z952 Presence of prosthetic heart valve: Secondary | ICD-10-CM | POA: Diagnosis not present

## 2022-08-13 DIAGNOSIS — E039 Hypothyroidism, unspecified: Secondary | ICD-10-CM | POA: Diagnosis not present

## 2022-08-13 DIAGNOSIS — Z5181 Encounter for therapeutic drug level monitoring: Secondary | ICD-10-CM | POA: Diagnosis not present

## 2022-08-13 DIAGNOSIS — I480 Paroxysmal atrial fibrillation: Secondary | ICD-10-CM | POA: Diagnosis not present

## 2022-08-13 DIAGNOSIS — I5042 Chronic combined systolic (congestive) and diastolic (congestive) heart failure: Secondary | ICD-10-CM | POA: Diagnosis not present

## 2022-08-13 DIAGNOSIS — S72001D Fracture of unspecified part of neck of right femur, subsequent encounter for closed fracture with routine healing: Secondary | ICD-10-CM | POA: Diagnosis not present

## 2022-08-13 LAB — CBC
HCT: 35.7 % — ABNORMAL LOW (ref 36.0–46.0)
Hemoglobin: 11.6 g/dL — ABNORMAL LOW (ref 12.0–15.0)
MCH: 28.2 pg (ref 26.0–34.0)
MCHC: 32.5 g/dL (ref 30.0–36.0)
MCV: 86.7 fL (ref 80.0–100.0)
Platelets: 126 10*3/uL — ABNORMAL LOW (ref 150–400)
RBC: 4.12 MIL/uL (ref 3.87–5.11)
RDW: 15.4 % (ref 11.5–15.5)
WBC: 10.6 10*3/uL — ABNORMAL HIGH (ref 4.0–10.5)
nRBC: 0 % (ref 0.0–0.2)

## 2022-08-13 LAB — PROTIME-INR
INR: 1.3 — ABNORMAL HIGH (ref 0.8–1.2)
Prothrombin Time: 16 seconds — ABNORMAL HIGH (ref 11.4–15.2)

## 2022-08-13 MED ORDER — WARFARIN - PHARMACIST DOSING INPATIENT: Freq: Every day | Status: DC

## 2022-08-13 MED ORDER — WARFARIN SODIUM 7.5 MG PO TABS
7.5000 mg | ORAL_TABLET | Freq: Every day | ORAL | Status: DC
  Administered 2022-08-13 – 2022-08-17 (×5): 7.5 mg via ORAL
  Filled 2022-08-13 (×5): qty 1

## 2022-08-13 NOTE — TOC Initial Note (Signed)
Transition of Care [facility]) - Initial/Assessment Note    Patient Details  Name: [name] [name] MRN: [id number] Date of Birth: 07/20/1946  Transition of Care [facility]) CM/SW Contact:    Joanne Chars, LCSW Phone Number: 08/13/2022, 3:41 PM  Clinical Narrative:     CSW met with pt regarding DC recommendation for SNF.  Pt agreeable to SNF, choice document given, first choice would be [facility].  Permission given to send out referral in hub, permission given to speak with daughters Lattie Haw and Maudie Mercury.  Pt lives home alone, no current services.  Pt is not vaccinated for covid.  Referral sent out in hub.  Reached out to [facility] to review.               Expected Discharge Plan: Skilled Nursing Facility Barriers to Discharge: Continued Medical Work up, SNF Pending bed offer   Patient Goals and CMS Choice Patient states their goals for this hospitalization and ongoing recovery are:: get Movement back CMS Medicare.gov Compare Post Acute Care list provided to:: Patient Choice offered to / list presented to : Patient  Expected Discharge Plan and Services Expected Discharge Plan: [place] In-house Referral: Clinical Social Work   Post Acute Care Choice: [place] Living arrangements for the past 2 months: [place]                                      Prior Living Arrangements/Services Living arrangements for the past 2 months: Single Family Home Lives with:: Self Patient language and need for interpreter reviewed:: Yes Do you feel safe going back to the place where you live?: Yes      Need for Family Participation in Patient Care: Yes (Comment) Care giver support system in place?: Yes (comment) Current home services: Other (comment) (none) Criminal Activity/Legal Involvement Pertinent to Current Situation/Hospitalization: No - Comment as needed  Activities of Daily Living Home Assistive Devices/Equipment: None ADL Screening  (condition at time of admission) Patient's cognitive ability adequate to safely complete daily activities?: Yes Is the patient deaf or have difficulty hearing?: No Does the patient have difficulty seeing, even when wearing glasses/contacts?: No Does the patient have difficulty concentrating, remembering, or making decisions?: No Patient able to express need for assistance with ADLs?: No Does the patient have difficulty dressing or bathing?: No Independently performs ADLs?: Yes (appropriate for developmental age) Does the patient have difficulty walking or climbing stairs?: Yes Weakness of Legs: Right Weakness of Arms/Hands: None  Permission Sought/Granted Permission sought to share information with : Family Supports Permission granted to share information with : Yes, Verbal Permission Granted  Share Information with NAME: daughters Lattie Haw and Maudie Mercury  Permission granted to share info w AGENCY: SNF        Emotional Assessment Appearance:: Appears stated age Attitude/Demeanor/Rapport: Engaged Affect (typically observed): Appropriate, Pleasant Orientation: : Oriented to Self, Oriented to Place, Oriented to  Time, Oriented to Situation      Admission diagnosis:  Chronic anticoagulation [Z79.01] Closed right hip fracture ([place]) [S72.001A] Closed fracture of right hip, initial encounter [facility]) [S72.001A] Patient Active Problem List   Diagnosis Date Noted   Closed right hip fracture ([place]) 08/11/2022   Supratherapeutic INR 08/11/2022   PAF (paroxysmal atrial fibrillation) ([place]) 08/11/2022   Fall at home, initial encounter 08/11/2022   Hypokalemia 10/17/2021   H/O mitral valve replacement with mechanical valve 10/14/2021   S/P TAVR (  transcatheter aortic valve replacement) 10/14/2021   Transaminitis    Chronic combined systolic and diastolic CHF (congestive heart failure) ([place]) 09/30/2021   AKI (acute kidney injury) ([place]) 09/30/2021   DNR (do not resuscitate) 09/30/2021   Malnutrition of  moderate degree 08/28/2021   Influenza A 08/26/2021   Acute on chronic HFrEF (heart failure with reduced ejection fraction) ([place]) 08/26/2021   Hypoxemia 08/26/2021   Severe aortic stenosis 08/26/2021   Chronic anticoagulation 08/26/2021   CKD (chronic kidney disease) stage 3, GFR 30-59 ml/min (HCC) 08/26/2021   Anxiety 05/13/2021   Thoracic aortic aneurysm 05/13/2021   Atrial arrhythmia 05/13/2021   Bradycardia 05/13/2021   GERD (gastroesophageal reflux disease) 05/13/2021   Hypothyroidism 05/13/2021   Long Q-T syndrome 05/13/2021   Rheumatic heart disease 05/13/2021   Myocardial infarct ([place]) 11/20/2011   TRICUSPID REGURGITATION 12/23/2010   Essential hypertension 07/31/2010   CONGESTIVE HEART FAILURE 07/31/2010   SHORTNESS OF BREATH 07/31/2010   Presence of permanent cardiac pacemaker 07/31/2010   PCP:  Ronita Hipps, MD Pharmacy:   [facility], [place] [address] Phone: [phone] Fax: [phone]  [id number] [place], [place] - [id number] [id number]: [phone]: [phone]    Social Determinants of Health ([place] Interventions    Readmission Risk Interventions    10/17/2021   12:17 PM  Readmission Risk Prevention Plan  Transportation Screening Complete  PCP or Specialist Appt within 3-5 Days Complete  Social Work Consult for [place] Complete  Palliative Care Screening Not Applicable  Medication Review (Press photographer Complete

## 2022-08-13 NOTE — Evaluation (Signed)
Physical Therapy Evaluation Patient Details Name: [name] [name] MRN: [id number] DOB: 1945/11/15 Today's Date: 08/13/2022  History of Present Illness  Pt is 76 yo female who presents on 08/11/22 with a fall when trying to get up from recliner. She stated her RLE gave out under her. Sustained a R femoral neck fx and underwent R THA. PMH: CHF, GERD, TAVR, HTN, hypothyroid, MI  Clinical Impression  Pt admitted with above diagnosis. Pt received sitting on BSC because she didn't want to lie in bed anymore. Pt feels like she cannot move RLE or put any wt through it but in fact, with encouragement she was able to stand with RW and take small steps fwd with mod A. Pt from home alone and recommend SNF for rehab before home but expect she will progress well.  Pt currently with functional limitations due to the deficits listed below (see PT Problem List). Pt will benefit from skilled PT to increase their independence and safety with mobility to allow discharge to the venue listed below.          Recommendations for follow up therapy are one component of a multi-disciplinary discharge planning process, led by the attending physician.  Recommendations may be updated based on patient status, additional functional criteria and insurance authorization.  Follow Up Recommendations Skilled nursing-short term rehab (<3 hours/day) Can patient physically be transported by private vehicle: Yes    Assistance Recommended at Discharge Frequent or constant Supervision/Assistance  Patient can return home with the following  A lot of help with walking and/or transfers;A little help with bathing/dressing/bathroom;Assistance with cooking/housework;Assist for transportation;Help with stairs or ramp for entrance    Equipment Recommendations Rolling walker (2 wheels)  Recommendations for Other Services  OT consult    Functional Status Assessment Patient has had a recent decline in their functional status and  demonstrates the ability to make significant improvements in function in a reasonable and predictable amount of time.     Precautions / Restrictions Precautions Precautions: Fall Restrictions Weight Bearing Restrictions: Yes RLE Weight Bearing: Weight bearing as tolerated      Mobility  Bed Mobility               General bed mobility comments: pt received sitting on BSC with nsg student present    Transfers Overall transfer level: Needs assistance Equipment used: Rolling walker (2 wheels) Transfers: Sit to/from Stand Sit to Stand: Min assist           General transfer comment: min A for power up, vc's for hand placement and sequencing    Ambulation/Gait Ambulation/Gait assistance: Mod assist Gait Distance (Feet): 3 Feet Assistive device: Rolling walker (2 wheels) Gait Pattern/deviations: Antalgic, Decreased step length - right, Decreased step length - left, Step-to pattern, Decreased weight shift to right Gait velocity: decreased Gait velocity interpretation: <1.31 ft/sec, indicative of household ambulator   General Gait Details: pt keeps saying she can't put wt on RLE or step but meanwhile is doing so. Mod A for wt shifting and to steady  Financial trader Rankin (Stroke Patients Only)       Balance Overall balance assessment: Needs assistance, History of Falls Sitting-balance support: No upper extremity supported, Feet supported Sitting balance-Leahy Scale: Fair     Standing balance support: Bilateral upper extremity supported Standing balance-Leahy Scale: Poor  Pertinent Vitals/Pain Pain Assessment Pain Assessment: Faces Faces Pain Scale: Hurts even more Pain Location: R hip Pain Descriptors / Indicators: Grimacing, Guarding, Sore Pain Intervention(s): Limited activity within patient's tolerance, Monitored during session    Home Living Family/patient expects to  be discharged to:: Private residence Living Arrangements: Alone Available Help at Discharge: Family;Available PRN/intermittently Type of Home: Mobile home Home Access: Stairs to enter Entrance Stairs-Rails: Right;Left;Can reach both Entrance Stairs-Number of Steps: 5   Home Layout: One level Home Equipment: None      Prior Function Prior Level of Function : Independent/Modified Independent             Mobility Comments: independent but mobility has been decreasing ADLs Comments: indpendent     Hand Dominance   Dominant Hand: Right    Extremity/Trunk Assessment   Upper Extremity Assessment Upper Extremity Assessment: Defer to OT evaluation    Lower Extremity Assessment Lower Extremity Assessment: Generalized weakness;RLE deficits/detail RLE Deficits / Details: hip flex 2-/5, knee ext 2+/5 RLE Sensation: WNL RLE Coordination: decreased gross motor    Cervical / Trunk Assessment Cervical / Trunk Assessment: Kyphotic  Communication   Communication: No difficulties  Cognition Arousal/Alertness: Awake/alert Behavior During Therapy: WFL for tasks assessed/performed Overall Cognitive Status: No family/caregiver present to determine baseline cognitive functioning                                 General Comments: STm deficits noted, may be her baseline        General Comments General comments (skin integrity, edema, etc.): VSS on RA. pt does well with positive encouragement, motivated to get home. Agreeable to rehab first but does not want to go to the one her late husband was in    Exercises Total Joint Exercises Ankle Circles/Pumps: AROM, Both, 10 reps, Seated Quad Sets: AROM, Both, 10 reps, Seated Gluteal Sets: AROM, Both, 10 reps, Seated   Assessment/Plan    PT Assessment Patient needs continued PT services  PT Problem List Decreased strength;Decreased range of motion;Decreased activity tolerance;Decreased balance;Decreased mobility;Decreased  coordination;Decreased knowledge of use of DME;Decreased safety awareness;Decreased knowledge of precautions;Pain       PT Treatment Interventions DME instruction;Gait training;Functional mobility training;Therapeutic activities;Therapeutic exercise;Balance training;Neuromuscular re-education;Patient/family education    PT Goals (Current goals can be found in the Care Plan section)  Acute Rehab PT Goals Patient Stated Goal: eventual return home PT Goal Formulation: With patient Time For Goal Achievement: 08/27/22 Potential to Achieve Goals: Good    Frequency Min 5X/week     Co-evaluation               AM-PAC PT "6 Clicks" Mobility  Outcome Measure Help needed turning from your back to your side while in a flat bed without using bedrails?: A Lot Help needed moving from lying on your back to sitting on the side of a flat bed without using bedrails?: A Lot Help needed moving to and from a bed to a chair (including a wheelchair)?: A Lot Help needed standing up from a chair using your arms (e.g., wheelchair or bedside chair)?: A Lot Help needed to walk in hospital room?: A Lot Help needed climbing 3-5 steps with a railing? : Total 6 Click Score: 11    End of Session Equipment Utilized During Treatment: Gait belt Activity Tolerance: Patient tolerated treatment well Patient left: in chair;with call bell/phone within reach;with chair alarm set Nurse Communication: Mobility status PT Visit  Diagnosis: Unsteadiness on feet (R26.81);Muscle weakness (generalized) (M62.81);Repeated falls (R29.6);Difficulty in walking, not elsewhere classified (R26.2);Pain Pain - Right/Left: Right Pain - part of body: Hip    Time: 2707-8675 PT Time Calculation (min) (ACUTE ONLY): 35 min   Charges:   PT Evaluation $PT Eval Moderate Complexity: 1 Mod PT Treatments $Gait Training: 8-22 mins        Lyanne Co, PT  Acute Rehab Services Secure chat preferred Office  [phone]   Lawana Chambers Johnica Armwood 08/13/2022, 1:04 PM

## 2022-08-13 NOTE — NC FL2 (Signed)
[place] LEVEL OF CARE SCREENING TOOL     IDENTIFICATION  Patient Name: [name] [name] Birthdate: Nov 26, 1945 Sex: female Admission Date (Current Location): 08/11/2022  [facility] and [place] Number:  Herbalist and Address:  [facility], [place] [address] [employer], [place] [address]      Provider Number: [id number]  Attending Physician Name and Address:  [name], MD  Relative Name and Phone Number:  Cruz,Lisa Daughter [phone]    Current Level of Care: Hospital Recommended Level of Care: [place] Prior Approval Number:    Date Approved/Denied:   PASRR Number:    Discharge Plan: SNF    Current Diagnoses: Patient Active Problem List   Diagnosis Date Noted   Closed right hip fracture ([place]) 08/11/2022   Supratherapeutic INR 08/11/2022   PAF (paroxysmal atrial fibrillation) ([place]) 08/11/2022   Fall at home, initial encounter 08/11/2022   Hypokalemia 10/17/2021   H/O mitral valve replacement with mechanical valve 10/14/2021   S/P TAVR (transcatheter aortic valve replacement) 10/14/2021   Transaminitis    Chronic combined systolic and diastolic CHF (congestive heart failure) ([place]) 09/30/2021   AKI (acute kidney injury) ([place]) 09/30/2021   DNR (do not resuscitate) 09/30/2021   Malnutrition of moderate degree 08/28/2021   Influenza A 08/26/2021   Acute on chronic HFrEF (heart failure with reduced ejection fraction) ([place]) 08/26/2021   Hypoxemia 08/26/2021   Severe aortic stenosis 08/26/2021   Chronic anticoagulation 08/26/2021   CKD (chronic kidney disease) stage 3, GFR 30-59 ml/min (HCC) 08/26/2021   Anxiety 05/13/2021   Thoracic aortic aneurysm 05/13/2021   Atrial arrhythmia 05/13/2021   Bradycardia 05/13/2021   GERD (gastroesophageal reflux disease) 05/13/2021   Hypothyroidism 05/13/2021   Long Q-T syndrome 05/13/2021   Rheumatic heart disease 05/13/2021   Myocardial infarct ([place]) 11/20/2011    TRICUSPID REGURGITATION 12/23/2010   Essential hypertension 07/31/2010   CONGESTIVE HEART FAILURE 07/31/2010   SHORTNESS OF BREATH 07/31/2010   Presence of permanent cardiac pacemaker 07/31/2010    Orientation RESPIRATION BLADDER Height & Weight     Self, Time, Situation, Place  O2 Continent Weight: 115 lb (52.2 kg) Height:  4\' 10"  (147.3 cm)  BEHAVIORAL SYMPTOMS/MOOD NEUROLOGICAL BOWEL NUTRITION STATUS      Continent Diet (see discharge summary)  AMBULATORY STATUS COMMUNICATION OF NEEDS Skin   Limited Assist Verbally Surgical wounds                       Personal Care Assistance Level of Assistance  Bathing, Feeding, Dressing Bathing Assistance: Limited assistance Feeding assistance: Limited assistance Dressing Assistance: Limited assistance     Functional Limitations Info  Sight, Hearing, Speech Sight Info: Adequate Hearing Info: Adequate Speech Info: Adequate    SPECIAL CARE FACTORS FREQUENCY  PT (By licensed PT), OT (By licensed OT)     PT Frequency: 5x week OT Frequency: 5x week            Contractures Contractures Info: Not present    Additional Factors Info  Code Status, Allergies Code Status Info: full Allergies Info: Atenolol, Metoprolol Succinate (Metoprolol), Baclofen, Latex           Current Medications (08/13/2022):  This is the current hospital active medication list Current Facility-Administered Medications  Medication Dose Route Frequency Provider Last Rate Last Admin   acetaminophen (TYLENOL) tablet 650 mg  650 mg Oral Q6H PRN Willaim Sheng, MD       Or  acetaminophen (TYLENOL) suppository 650 mg  650 mg Rectal Q6H PRN Willaim Sheng, MD       ALPRAZolam Duanne Moron) tablet 1.5 mg  1.5 mg Oral BID PRN Willaim Sheng, MD   1.5 mg at 08/13/22 1321   atorvastatin (LIPITOR) tablet 80 mg  80 mg Oral QHS Willaim Sheng, MD   80 mg at 08/12/22 2109   cyclobenzaprine (FLEXERIL) tablet 5 mg  5 mg Oral QHS PRN Willaim Sheng, MD       diltiazem (CARDIZEM CD) 24 hr capsule 240 mg  240 mg Oral Daily PRN Willaim Sheng, MD   240 mg at 08/13/22 0845   enoxaparin (LOVENOX) injection 40 mg  40 mg Subcutaneous Q24H Willaim Sheng, MD   40 mg at 08/13/22 0845   feeding supplement (ENSURE ENLIVE / ENSURE PLUS) liquid 237 mL  237 mL Oral BID BM Willaim Sheng, MD   237 mL at 08/13/22 1000   furosemide (LASIX) tablet 30 mg  30 mg Oral Daily Willaim Sheng, MD   30 mg at 08/13/22 0845   hydrochlorothiazide (HYDRODIURIL) tablet 25 mg  25 mg Oral Daily Willaim Sheng, MD   25 mg at 08/13/22 0845   HYDROmorphone (DILAUDID) injection 0.5 mg  0.5 mg Intravenous Q2H PRN Willaim Sheng, MD   0.5 mg at 08/12/22 1150   levothyroxine (SYNTHROID) tablet 75 mcg  75 mcg Oral QAC breakfast Willaim Sheng, MD   75 mcg at 08/13/22 0845   lidocaine (LIDODERM) 5 % 1 patch  1 patch Transdermal Q24H Willaim Sheng, MD   1 patch at 08/13/22 1327   morphine (PF) 2 MG/ML injection 0.5 mg  0.5 mg Intravenous Q2H PRN Willaim Sheng, MD       naloxone [facility]) injection 0.4 mg  0.4 mg Intravenous PRN Willaim Sheng, MD       ondansetron [facility]) injection 4 mg  4 mg Intravenous Q6H PRN Willaim Sheng, MD       oxyCODONE (Oxy IR/ROXICODONE) immediate release tablet 5-10 mg  5-10 mg Oral Q4H PRN Willaim Sheng, MD   5 mg at 08/13/22 0413   senna-docusate (Senokot-S) tablet 1 tablet  1 tablet Oral QHS Willaim Sheng, MD   1 tablet at 08/12/22 2111   warfarin (COUMADIN) tablet 7.5 mg  7.5 mg Oral q1600 Lerry Liner, Student-PharmD       Warfarin - Pharmacist Dosing Inpatient   Does not apply [id number] Laren Everts, RPH       zolpidem (AMBIEN) tablet 5 mg  5 mg Oral QHS PRN Willaim Sheng, MD   5 mg at 08/12/22 2110     Discharge Medications: Please see discharge summary for a list of discharge medications.  Relevant Imaging Results:  Relevant Lab  Results:   Additional Information SSN: [id number].  Pt is not vaccinated for covid.  Joanne Chars, LCSW

## 2022-08-13 NOTE — Progress Notes (Cosign Needed)
RE:  [name] [name]    Date of Birth: 04-16-2046      Date:   08/13/22       To Whom It May Concern:  Please be advised that the above-named patient will require a short-term nursing home stay - anticipated 30 days or less for rehabilitation and strengthening.  The plan is for return home.                 MD signature                Date

## 2022-08-13 NOTE — Progress Notes (Addendum)
[facility]  PROGRESS NOTE  [name] [name] QTM:[id number] DOB: [birth year] DOA: 08/11/2022 PCP: Ronita Hipps, MD   Brief HPI:   76 year old female with medical history of hypertension, hyperlipidemia, paroxysmal atrial fibrillation on anticoagulation with warfarin, severe leukocytosis s/p TAVR in 2022, CHF with last EF of 50 to 55%, s/p mechanical mitral valve, status post pacemaker, hypothyroidism, AAA, GERD presented after having fall at home.  In the ED x-ray of the hip revealed minimally displaced fracture of right femoral neck.  Orthopedics was consulted.  Patient found to have elevated INR, vitamin K 3 mg and 2 units FFP ordered.    Subjective   Patient seen and examined, denies pain.  No shortness of breath.   Assessment/Plan:     Right femoral neck fracture; secondary to mechanical fall -Status post right THA for femoral neck fracture -Continue hydrocodone/morphine as needed -Orthopedics following   Chronic systolic heart failure/critical AS s/p TAVR/rheumatic heart disease s/p mechanical MVR -Cardiology was consulted for preop evaluation -Patient started on Lovenox 40 mg subcu daily along with Coumadin per pharmacy -We will observe for next 2 days as per cardiology and likely discharge home on Coumadin with Lovenox bridge  Hypothyroidism -Continue levothyroxine  Severe aortic stenosis s/p TAVR -S/p surgical repair in 12/22  Hypokalemia -Potassium is 3.3 this morning -We will replace potassium with IV KCl 10 meq x 1 -Check BMP today    Medications     atorvastatin  80 mg Oral QHS   enoxaparin  40 mg Subcutaneous Q24H   feeding supplement  237 mL Oral BID BM   furosemide  30 mg Oral Daily   hydrochlorothiazide  25 mg Oral Daily   levothyroxine  75 mcg Oral QAC breakfast   lidocaine  1 patch Transdermal Q24H   senna-docusate  1 tablet Oral QHS   warfarin  7.5 mg Oral q1600   Warfarin - Pharmacist Dosing Inpatient   Does not apply q1600      Data Reviewed:   CBG:  No results for input(s): "GLUCAP" in the last 168 hours.  SpO2: 97 % O2 Flow Rate (L/min): 3 L/min    Vitals:   08/12/22 1832 08/12/22 1950 08/12/22 2050 08/13/22 0740  BP: 128/69 138/72 117/73 129/66  Pulse: 69 87 86 63  Resp: 17 17 (!) 22   Temp: 98 F (36.7 C)   98 F (36.7 C)  TempSrc:    Oral  SpO2: 93% 94% 97% 97%  Weight:      Height:          Data Reviewed:  Basic Metabolic Panel: Recent Labs  Lab 08/11/22 0144 08/11/22 1507 08/12/22 0411  NA 135 136 135  K 2.4* 3.5 3.3*  CL 95* 98 94*  CO2 24 29 29   GLUCOSE 120* 132* 119*  BUN 16 13 14   CREATININE 1.01* 0.83 0.87  CALCIUM 9.5 8.7* 8.8*  MG 1.9  --   --     CBC: Recent Labs  Lab 08/11/22 0144 08/12/22 0411 08/13/22 0352  WBC 7.4 6.8 10.6*  NEUTROABS 6.2  --   --   HGB 12.7 12.1 11.6*  HCT 39.4 37.7 35.7*  MCV 88.3 88.5 86.7  PLT 152 117* 126*    LFT Recent Labs  Lab 08/11/22 0144  AST 31  ALT 18  ALKPHOS 111  BILITOT 0.8  PROT 7.4  ALBUMIN 4.0     Antibiotics: Anti-infectives (From admission, onward)    Start     Dose/Rate Route Frequency  Ordered Stop   08/12/22 2200  ceFAZolin (ANCEF) IVPB 2g/100 mL premix        2 g 200 mL/hr over 30 Minutes Intravenous Every 8 hours 08/12/22 1841 08/13/22 0540   08/12/22 0600  ceFAZolin (ANCEF) IVPB 2g/100 mL premix        2 g 200 mL/hr over 30 Minutes Intravenous On call to O.R. 08/11/22 2215 08/12/22 1455        DVT prophylaxis: As per orthopedics  Code Status: Full code  Family Communication: No family at bedside   CONSULTS orthopedics, cardiology   Objective    Physical Examination:   General-appears in no acute distress Heart-S1-S2, regular, no murmur auscultated Lungs-clear to auscultation bilaterally, no wheezing or crackles auscultated Abdomen-soft, nontender, no organomegaly Extremities-no edema in the lower extremities Neuro-alert, oriented x3, no focal deficit noted   Status is:  Inpatient:             [name]   [facility] If 7PM-7AM, please contact night-coverage at [email or web address], Office  [phone]   08/13/2022, 3:06 PM  LOS: 2 days

## 2022-08-13 NOTE — Progress Notes (Addendum)
Rounding Note    Patient Name: [name] [name] Date of Encounter: 08/13/2022  [facility] Cardiologist: Sherren Mocha, MD   Subjective   Feeling well, sitting up on the bedside commode.   Inpatient Medications    Scheduled Meds:  atorvastatin  80 mg Oral QHS   enoxaparin  40 mg Subcutaneous Q24H   feeding supplement  237 mL Oral BID BM   furosemide  30 mg Oral Daily   hydrochlorothiazide  25 mg Oral Daily   levothyroxine  75 mcg Oral QAC breakfast   lidocaine  1 patch Transdermal Q24H   senna-docusate  1 tablet Oral QHS   warfarin  7.5 mg Oral q1600   Warfarin - Pharmacist Dosing Inpatient   Does not apply q1600   Continuous Infusions:  PRN Meds: acetaminophen **OR** acetaminophen, alprazolam, cyclobenzaprine, diltiazem, HYDROmorphone (DILAUDID) injection, morphine injection, naLOXone (NARCAN)  injection, ondansetron (ZOFRAN) IV, oxyCODONE, zolpidem   Vital Signs    Vitals:   08/12/22 1832 08/12/22 1950 08/12/22 2050 08/13/22 0740  BP: 128/69 138/72 117/73 129/66  Pulse: 69 87 86 63  Resp: 17 17 (!) 22   Temp: 98 F (36.7 C)   98 F (36.7 C)  TempSrc:    Oral  SpO2: 93% 94% 97% 97%  Weight:      Height:        Intake/Output Summary (Last 24 hours) at 08/13/2022 1032 Last data filed at 08/12/2022 1648 Gross per 24 hour  Intake 1000 ml  Output 250 ml  Net 750 ml      08/11/2022    1:42 AM 05/18/2022    1:03 PM 12/10/2021    1:28 PM  Last 3 Weights  Weight (lbs) 115 lb 113 lb 103 lb  Weight (kg) 52.164 kg 51.256 kg 46.72 kg      Telemetry    Sinus Rhythm - Personally Reviewed  ECG    No new tracing  Physical Exam   GEN: No acute distress.   Neck: No JVD Cardiac: RRR, 3/6 systolic murmur with mechanical valve click, no rubs, or gallops.  Respiratory: Clear to auscultation bilaterally. GI: Soft, nontender, non-distended  MS: No edema; No deformity. Neuro:  Nonfocal  Psych: Normal affect   Labs    High Sensitivity  Troponin:  No results for input(s): "TROPONINIHS" in the last 720 hours.   Chemistry Recent Labs  Lab 08/11/22 0144 08/11/22 1507 08/12/22 0411  NA 135 136 135  K 2.4* 3.5 3.3*  CL 95* 98 94*  CO2 24 29 29   GLUCOSE 120* 132* 119*  BUN 16 13 14   CREATININE 1.01* 0.83 0.87  CALCIUM 9.5 8.7* 8.8*  MG 1.9  --   --   PROT 7.4  --   --   ALBUMIN 4.0  --   --   AST 31  --   --   ALT 18  --   --   ALKPHOS 111  --   --   BILITOT 0.8  --   --   GFRNONAA 58* >60 >60  ANIONGAP 16* 9 12    Lipids No results for input(s): "CHOL", "TRIG", "HDL", "LABVLDL", "LDLCALC", "CHOLHDL" in the last 168 hours.  Hematology Recent Labs  Lab 08/11/22 0144 08/12/22 0411 08/13/22 0352  WBC 7.4 6.8 10.6*  RBC 4.46 4.26 4.12  HGB 12.7 12.1 11.6*  HCT 39.4 37.7 35.7*  MCV 88.3 88.5 86.7  MCH 28.5 28.4 28.2  MCHC 32.2 32.1 32.5  RDW 15.4 15.7* 15.4  PLT 152 117* 126*   Thyroid  Recent Labs  Lab 08/11/22 1507  TSH 10.380*    BNPNo results for input(s): "BNP", "PROBNP" in the last 168 hours.  DDimer No results for input(s): "DDIMER" in the last 168 hours.   Radiology    DG HIP UNILAT W OR W/O PELVIS 2-3 VIEWS RIGHT  Result Date: 08/12/2022 CLINICAL DATA:  Status post total right hip arthroplasty. Postoperative. EXAM: DG HIP (WITH OR WITHOUT PELVIS) 2-3V RIGHT COMPARISON:  Intraoperative radiograph right hip 08/12/2022, pelvis and right hip radiographs 08/11/2022 FINDINGS: Interval total right hip arthroplasty. No perihardware lucency is seen to indicate hardware failure or loosening. Expected postoperative changes including right hip subcutaneous air. Mild pubic symphysis joint space narrowing and subchondral sclerosis. Mild superior left femoroacetabular joint space narrowing. No acute fracture or dislocation. IMPRESSION: Interval total right hip arthroplasty without evidence of hardware failure. Electronically Signed   By: Yvonne Kendall M.D.   On: 08/12/2022 18:46   DG HIP PORT UNILAT WITH  PELVIS 1V RIGHT  Result Date: 08/12/2022 CLINICAL DATA:  Right total hip arthroplasty. EXAM: DG HIP (WITH OR WITHOUT PELVIS) 1V PORT RIGHT COMPARISON:  08/11/2022 FINDINGS: A single spot intraoperative image the right hip is provided for review and demonstrates the sequela of presumed ongoing right total hip replacement. Alignment appears anatomic given AP projection. No definite fracture. Surgical support apparatus overlies the right lower abdominal quadrant. Expected subcutaneous emphysema about the operative site. No definite radiopaque foreign body. IMPRESSION: Intraoperative radiograph demonstrates the sequela of ongoing right total hip replacement without evidence of complication. Electronically Signed   By: Sandi Mariscal M.D.   On: 08/12/2022 16:29    Cardiac Studies   N/a   Patient Profile     76 y.o. female  with a hx of rheumatic fever with rheumatic heart disease s/p mechanical MVR, critical aortic stenosis s/p TAVR, paroxysmal atrial fibrillation on Coumadin, bradycardia status post PPM placement, CAD s/p PCI of LAD, COPD who is being seen 08/11/2022 for the evaluation of preop evaluation at the request of Dr. Tamala Julian.  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 Would recommend a 1-2 days to  follow INR trend before DC home with lovenox bridge   CAD s/p LAD stent -- last cath 12/22 with patent LAD stent and mild nonobstructive CAD -- no anginal symptoms -- continue medical management    Paroxysmal atrial fibrillation -- as above coumadin/lovenox  Bradycardia s/p dual chamber PPM -- V pacing intermittently   Hypothyroidism -- TSH 10.3 -- on synthroid 75mcg     For questions or updates, please contact [facility] Please consult [email or web address] for contact info under        Signed, Reino Bellis, NP  08/13/2022, 10:32 AM  Patient seen and examined. Agree with assessment and plan.  Patient feels well sitting in chair status post right total hip arthroplasty yesterday secondary to right displaced femoral neck fracture.  She tolerated surgery well.  Patient has a mechanical mitral valve from rheumatic heart disease and is status post TAVR.  Currently no chest pain or shortness of breath.  JVD minimally increased.  Blood pressure 122/66.  Intermittent V pacing with ectopy.  She was given a dose of prophylactic Lovenox last night.  Discussed with pharmacy.  We will give Lovenox subcutaneously along with reinitiation of warfarin.  Recommend staying in the hospital to follow INR trend and may require DC home with Lovenox bridge.   Troy Sine, MD, [facility] 08/13/2022 12:11 PM

## 2022-08-13 NOTE — Progress Notes (Signed)
ANTICOAGULATION CONSULT NOTE - Follow Up Consult  Pharmacy Consult for warfarin Indication: atrial fibrillation and mechanical mitral valve  Allergies  Allergen Reactions   Atenolol Other (See Comments)    HR and pulse dropped   Metoprolol Succinate [Metoprolol] Other (See Comments)    HR and pulse dropped   Baclofen Anxiety and Other (See Comments)    Makes the patient feel anxious the day after taking this. "Makes me jerk"    Latex Rash    Patient Measurements: Height: 4\' 10"  (147.3 cm) Weight: 52.2 kg (115 lb) IBW/kg (Calculated) : 40.9  Vital Signs: BP: 117/73 (10/25 2050) Pulse Rate: 86 (10/25 2050)  Labs: Recent Labs    08/11/22 0144 08/11/22 1018 08/11/22 1507 08/12/22 0411  HGB 12.7  --   --  12.1  HCT 39.4  --   --  37.7  PLT 152  --   --  117*  LABPROT 42.9* 21.0*  --  16.8*  INR 4.6* 1.8*  --  1.4*  HEPARINUNFRC  --   --   --  0.40  CREATININE 1.01*  --  0.83 0.87    Estimated Creatinine Clearance: 40 mL/min (by C-G formula based on SCr of 0.87 mg/dL).   Assessment: 76 y/o female s/p R THA. PMH significant for mechanical mitral valve, PAF, and severe aortic stenosis s/p TAVR. Pt on warfarin PTA, 7.5 mg daily. Last dose reported as 10/23. 10/25 am INR was 1.4.  Ortho cleared pt to resume oral anticoag and start Lovenox bridge. They are aware that current Lovenox 40 mg q24 is not a therapeutic bridge but wants to start at that dose given low wt and concern for postop hematoma. First Lovenox dose scheduled for 10/26 1000. Goal of Therapy:  INR ~3 Monitor platelets by anticoagulation protocol: Yes   Plan:  Resume warfarin 7.5 mg daily. Monitor CBC, INR, and for s/sx of bleeding.  Lerry Liner, PharmD Candidate class of 2024 08/13/2022,7:29 AM

## 2022-08-13 NOTE — Discharge Instructions (Signed)

## 2022-08-13 NOTE — Progress Notes (Signed)
     Subjective: Patient reports pain as minimal.  Well controlled overnight.  Has been up to the bedside commode twice overnight with nursing.  Eager to work with physical therapy.  Hopeful to go home.  Denies distal numbness and tingling.  Objective:   VITALS:   Vitals:   08/12/22 1800 08/12/22 1832 08/12/22 1950 08/12/22 2050  BP: 132/63 128/69 138/72 117/73  Pulse: 69 69 87 86  Resp: $Remo'18 17 17 'ODhZh$ (!) 22  Temp: 97.6 F (36.4 C) 98 F (36.7 C)    TempSrc:      SpO2: 92% 93% 94% 97%  Weight:      Height:        Sensation intact distally Intact pulses distally Dorsiflexion/Plantar flexion intact Incision: dressing C/D/I No cellulitis present Compartment soft Minimal swelling about R hip  Lab Results  Component Value Date   WBC 6.8 08/12/2022   HGB 12.1 08/12/2022   HCT 37.7 08/12/2022   MCV 88.5 08/12/2022   PLT 117 (L) 08/12/2022   BMET    Component Value Date/Time   NA 135 08/12/2022 0411   NA 142 12/10/2021 1401   K 3.3 (L) 08/12/2022 0411   CL 94 (L) 08/12/2022 0411   CO2 29 08/12/2022 0411   GLUCOSE 119 (H) 08/12/2022 0411   BUN 14 08/12/2022 0411   BUN 13 12/10/2021 1401   CREATININE 0.87 08/12/2022 0411   CALCIUM 8.8 (L) 08/12/2022 0411   EGFR 66 12/10/2021 1401   GFRNONAA >60 08/12/2022 0411      Xray: post op xrays demonstrate well positioned THA components no adverse features  Assessment/Plan: 1 Day Post-Op   Principal Problem:   Closed right hip fracture (HCC) Active Problems:   Presence of permanent cardiac pacemaker   Anxiety   Hypothyroidism   Chronic combined systolic and diastolic CHF (congestive heart failure) (HCC)   H/O mitral valve replacement with mechanical valve   S/P TAVR (transcatheter aortic valve replacement)   Hypokalemia   Supratherapeutic INR   PAF (paroxysmal atrial fibrillation) (HCC)   Fall at home, initial encounter  S/p R THA for femoral neck fracture  Post op recs: WB: WBAT RLE, No formal hip  precautions Abx: ancef Imaging: PACU pelvis Xray Dressing: Aquacell, keep intact until follow up DVT prophylaxis: Discussed with cardiology team well resume Coumadin postoperatively and bridged with Lovenox 40 daily starting postop day 1 until she is therapeutic on the Coumadin. Follow up: 2 weeks after surgery for a wound check with Dr. Zachery Dakins at [facility]  Address: [address] [place], [place], [place] [address]  Office Phone: [phone]  Willaim Sheng 08/13/2022, 7:11 AM   Charlies Constable, MD  Contact information:   [phone] 7am-5pm epic message Dr. Zachery Dakins, or call office for patient follow up: [phone] After hours and holidays please check [email or web address] for group call information for [facility]

## 2022-08-14 DIAGNOSIS — I5042 Chronic combined systolic (congestive) and diastolic (congestive) heart failure: Secondary | ICD-10-CM | POA: Diagnosis not present

## 2022-08-14 DIAGNOSIS — Z5181 Encounter for therapeutic drug level monitoring: Secondary | ICD-10-CM

## 2022-08-14 DIAGNOSIS — Z952 Presence of prosthetic heart valve: Secondary | ICD-10-CM | POA: Diagnosis not present

## 2022-08-14 DIAGNOSIS — S72001D Fracture of unspecified part of neck of right femur, subsequent encounter for closed fracture with routine healing: Secondary | ICD-10-CM | POA: Diagnosis not present

## 2022-08-14 DIAGNOSIS — Z7901 Long term (current) use of anticoagulants: Secondary | ICD-10-CM

## 2022-08-14 LAB — BASIC METABOLIC PANEL
Anion gap: 10 (ref 5–15)
BUN: 16 mg/dL (ref 8–23)
CO2: 31 mmol/L (ref 22–32)
Calcium: 8.3 mg/dL — ABNORMAL LOW (ref 8.9–10.3)
Chloride: 88 mmol/L — ABNORMAL LOW (ref 98–111)
Creatinine, Ser: 0.77 mg/dL (ref 0.44–1.00)
GFR, Estimated: >60 mL/min (ref >60)
Glucose, Bld: 111 mg/dL — ABNORMAL HIGH (ref 70–99)
Potassium: 2.6 mmol/L — CL (ref 3.5–5.1)
Sodium: 129 mmol/L — ABNORMAL LOW (ref 135–145)

## 2022-08-14 LAB — CBC
HCT: 29.3 % — ABNORMAL LOW (ref 36.0–46.0)
Hemoglobin: 10 g/dL — ABNORMAL LOW (ref 12.0–15.0)
MCH: 29 pg (ref 26.0–34.0)
MCHC: 34.1 g/dL (ref 30.0–36.0)
MCV: 84.9 fL (ref 80.0–100.0)
Platelets: 103 10*3/uL — ABNORMAL LOW (ref 150–400)
RBC: 3.45 MIL/uL — ABNORMAL LOW (ref 3.87–5.11)
RDW: 15 % (ref 11.5–15.5)
WBC: 8.3 10*3/uL (ref 4.0–10.5)
nRBC: 0 % (ref 0.0–0.2)

## 2022-08-14 LAB — PROTIME-INR
INR: 1.5 — ABNORMAL HIGH (ref 0.8–1.2)
Prothrombin Time: 17.5 seconds — ABNORMAL HIGH (ref 11.4–15.2)

## 2022-08-14 MED ORDER — ENOXAPARIN SODIUM 40 MG/0.4ML IJ SOSY
60.0000 mg | PREFILLED_SYRINGE | Freq: Once | INTRAMUSCULAR | Status: DC
  Filled 2022-08-14: qty 0.8

## 2022-08-14 MED ORDER — POTASSIUM CHLORIDE 10 MEQ/100ML IV SOLN
10.0000 meq | INTRAVENOUS | Status: AC
  Administered 2022-08-14 (×4): 10 meq via INTRAVENOUS
  Filled 2022-08-14 (×4): qty 100

## 2022-08-14 MED ORDER — POTASSIUM CHLORIDE CRYS ER 20 MEQ PO TBCR
40.0000 meq | EXTENDED_RELEASE_TABLET | Freq: Once | ORAL | Status: AC
  Administered 2022-08-14: 40 meq via ORAL
  Filled 2022-08-14: qty 2

## 2022-08-14 MED ORDER — HYDRALAZINE HCL 25 MG PO TABS: 25.0000 mg | ORAL_TABLET | Freq: Four times a day (QID) | ORAL | Status: DC | PRN

## 2022-08-14 MED ORDER — ENOXAPARIN SODIUM 60 MG/0.6ML IJ SOSY
60.0000 mg | PREFILLED_SYRINGE | Freq: Once | INTRAMUSCULAR | Status: AC
  Administered 2022-08-14: 60 mg via SUBCUTANEOUS
  Filled 2022-08-14: qty 0.6

## 2022-08-14 MED ORDER — ENOXAPARIN SODIUM 60 MG/0.6ML IJ SOSY
60.0000 mg | PREFILLED_SYRINGE | Freq: Two times a day (BID) | INTRAMUSCULAR | Status: DC
  Administered 2022-08-15 – 2022-08-17 (×6): 60 mg via SUBCUTANEOUS
  Filled 2022-08-14 (×8): qty 0.6

## 2022-08-14 NOTE — Plan of Care (Signed)

## 2022-08-14 NOTE — Progress Notes (Signed)
Lab called in critical potassium of 2.6. Messaged on call NP.

## 2022-08-14 NOTE — Progress Notes (Signed)
ANTICOAGULATION CONSULT NOTE - Follow Up Consult  Pharmacy Consult for warfarin Indication: atrial fibrillation and mechanical mitral valve  Allergies  Allergen Reactions   Atenolol Other (See Comments)    HR and pulse dropped   Metoprolol Succinate [Metoprolol] Other (See Comments)    HR and pulse dropped   Baclofen Anxiety and Other (See Comments)    Makes the patient feel anxious the day after taking this. "Makes me jerk"    Latex Rash    Patient Measurements: Height: 4\' 10"  (147.3 cm) Weight: 58.8 kg (129 lb 10.1 oz) IBW/kg (Calculated) : 40.9  Vital Signs: Temp: 100 F (37.8 C) (10/26 2145) Temp Source: Oral (10/26 2145) BP: 120/72 (10/26 2145) Pulse Rate: 75 (10/26 2145)  Labs: Recent Labs    08/11/22 1018 08/11/22 1507 08/12/22 0411 08/13/22 0352 08/13/22 0816 08/14/22 0439  HGB   < >  --  12.1 11.6*  --  10.0*  HCT  --   --  37.7 35.7*  --  29.3*  PLT  --   --  117* 126*  --  103*  LABPROT  --   --  16.8*  --  16.0* 17.5*  INR  --   --  1.4*  --  1.3* 1.5*  HEPARINUNFRC  --   --  0.40  --   --   --   CREATININE  --  0.83 0.87  --   --  0.77   < > = values in this interval not displayed.     Estimated Creatinine Clearance: 46.1 mL/min (by C-G formula based on SCr of 0.77 mg/dL).   Assessment: 76 y/o female s/p R THA. PMH significant for mechanical mitral valve, PAF, and severe aortic stenosis s/p TAVR. Pt on warfarin PTA, 7.5 mg daily. Last dose reported as 10/23.   INR below goal but already started to trend up at 1.5 this morning. Sq lovenox and warfarin resumed yesterday 10/26. Hemoglobin is trending down from 12.1 on admit to 10 this morning. No bleeding issues noted.   Of note patient's weight is up 15lbs this admit, will have nursing confirm and adjust lovenox weight accordingly. Will plan on increasing her lovenox to bid dosing on 10/28 given her mechanical valve and she will need an INR check early next week.   Goal of Therapy:  INR  2.5-3.5 Monitor platelets by anticoagulation protocol: Yes   Plan:  Continue warfarin 7.5 mg daily. Will give lovenox 60mg  today then will plan on increasing to 60mg  bid on 10/28 if weight is correct Monitor CBC, INR, and for s/sx of bleeding.  Erin Hearing PharmD., BCPS Clinical Pharmacist 08/14/2022 8:37 AM

## 2022-08-14 NOTE — Progress Notes (Addendum)
Rounding Note    Patient Name: [name] [name] Date of Encounter: 08/14/2022  [facility] Cardiologist: Tonny Bollman, MD   Subjective   Pt found sitting up in bed. She denies shortness of breath. She states she slept well last night.  Inpatient Medications    Scheduled Meds:  atorvastatin  80 mg Oral QHS   [START ON 08/15/2022] enoxaparin (LOVENOX) injection  60 mg Subcutaneous Q12H   enoxaparin (LOVENOX) injection  60 mg Subcutaneous Once   feeding supplement  237 mL Oral BID BM   furosemide  30 mg Oral Daily   hydrochlorothiazide  25 mg Oral Daily   levothyroxine  75 mcg Oral QAC breakfast   lidocaine  1 patch Transdermal Q24H   senna-docusate  1 tablet Oral QHS   warfarin  7.5 mg Oral q1600   Warfarin - Pharmacist Dosing Inpatient   Does not apply q1600   Continuous Infusions:  potassium chloride 10 mEq (08/14/22 0728)   PRN Meds: acetaminophen **OR** acetaminophen, alprazolam, cyclobenzaprine, diltiazem, HYDROmorphone (DILAUDID) injection, morphine injection, naLOXone (NARCAN)  injection, ondansetron (ZOFRAN) IV, oxyCODONE, zolpidem   Vital Signs    Vitals:   08/13/22 1602 08/13/22 2145 08/14/22 0500 08/14/22 0745  BP: 128/80 120/72  121/76  Pulse: 76 75  80  Resp:  20  19  Temp: 98.6 F (37 C) 100 F (37.8 C)  99.7 F (37.6 C)  TempSrc: Oral Oral  Oral  SpO2: 100% 97%  98%  Weight:   58.8 kg   Height:        Intake/Output Summary (Last 24 hours) at 08/14/2022 0945 Last data filed at 08/13/2022 2154 Gross per 24 hour  Intake 480 ml  Output --  Net 480 ml      08/14/2022    5:00 AM 08/11/2022    1:42 AM 05/18/2022    1:03 PM  Last 3 Weights  Weight (lbs) 129 lb 10.1 oz 115 lb 113 lb  Weight (kg) 58.8 kg 52.164 kg 51.256 kg      Telemetry    Sinus rhythm HR 80s - Personally Reviewed  ECG    No new tracings - Personally Reviewed  Physical Exam   GEN: No acute distress.   Neck: No JVD Cardiac: RRR, no murmurs, rubs, or  gallops.  Respiratory: anterior lung exam clear GI: Soft, nontender, non-distended  MS: No edema, compression stockings in place Neuro:  Nonfocal  Psych: Normal affect   Labs    High Sensitivity Troponin:  No results for input(s): "TROPONINIHS" in the last 720 hours.   Chemistry Recent Labs  Lab 08/11/22 0144 08/11/22 1507 08/12/22 0411 08/14/22 0439  NA 135 136 135 129*  K 2.4* 3.5 3.3* 2.6*  CL 95* 98 94* 88*  CO2 24 29 29 31   GLUCOSE 120* 132* 119* 111*  BUN 16 13 14 16   CREATININE 1.01* 0.83 0.87 0.77  CALCIUM 9.5 8.7* 8.8* 8.3*  MG 1.9  --   --   --   PROT 7.4  --   --   --   ALBUMIN 4.0  --   --   --   AST 31  --   --   --   ALT 18  --   --   --   ALKPHOS 111  --   --   --   BILITOT 0.8  --   --   --   GFRNONAA 58* >60 >60 >60  ANIONGAP 16* 9 12 10  Lipids No results for input(s): "CHOL", "TRIG", "HDL", "LABVLDL", "LDLCALC", "CHOLHDL" in the last 168 hours.  Hematology Recent Labs  Lab 08/12/22 0411 08/13/22 0352 08/14/22 0439  WBC 6.8 10.6* 8.3  RBC 4.26 4.12 3.45*  HGB 12.1 11.6* 10.0*  HCT 37.7 35.7* 29.3*  MCV 88.5 86.7 84.9  MCH 28.4 28.2 29.0  MCHC 32.1 32.5 34.1  RDW 15.7* 15.4 15.0  PLT 117* 126* 103*   Thyroid  Recent Labs  Lab 08/11/22 1507  TSH 10.380*    BNPNo results for input(s): "BNP", "PROBNP" in the last 168 hours.  DDimer No results for input(s): "DDIMER" in the last 168 hours.   Radiology    DG HIP UNILAT W OR W/O PELVIS 2-3 VIEWS RIGHT  Result Date: 08/12/2022 CLINICAL DATA:  Status post total right hip arthroplasty. Postoperative. EXAM: DG HIP (WITH OR WITHOUT PELVIS) 2-3V RIGHT COMPARISON:  Intraoperative radiograph right hip 08/12/2022, pelvis and right hip radiographs 08/11/2022 FINDINGS: Interval total right hip arthroplasty. No perihardware lucency is seen to indicate hardware failure or loosening. Expected postoperative changes including right hip subcutaneous air. Mild pubic symphysis joint space narrowing and  subchondral sclerosis. Mild superior left femoroacetabular joint space narrowing. No acute fracture or dislocation. IMPRESSION: Interval total right hip arthroplasty without evidence of hardware failure. Electronically Signed   By: Yvonne Kendall M.D.   On: 08/12/2022 18:46   DG HIP PORT UNILAT WITH PELVIS 1V RIGHT  Result Date: 08/12/2022 CLINICAL DATA:  Right total hip arthroplasty. EXAM: DG HIP (WITH OR WITHOUT PELVIS) 1V PORT RIGHT COMPARISON:  08/11/2022 FINDINGS: A single spot intraoperative image the right hip is provided for review and demonstrates the sequela of presumed ongoing right total hip replacement. Alignment appears anatomic given AP projection. No definite fracture. Surgical support apparatus overlies the right lower abdominal quadrant. Expected subcutaneous emphysema about the operative site. No definite radiopaque foreign body. IMPRESSION: Intraoperative radiograph demonstrates the sequela of ongoing right total hip replacement without evidence of complication. Electronically Signed   By: Sandi Mariscal M.D.   On: 08/12/2022 16:29    Cardiac Studies   Echo 11/2021:  1. Left ventricular ejection fraction, by estimation, is 50 to 55%. The  left ventricle has low normal function. The left ventricle demonstrates  regional wall motion abnormalities (see scoring diagram/findings for  description). There is severe asymmetric   left ventricular hypertrophy of the basal-septal segment. Left  ventricular diastolic function could not be evaluated. Elevated left  ventricular end-diastolic pressure. There is dyskinesis of the left  ventricular, mid-apical septal wall.   2. Right ventricular systolic function is normal. The right ventricular  size is moderately enlarged. There is moderately elevated pulmonary artery  systolic pressure. The estimated right ventricular systolic pressure is  56.3 mmHg.   3. Left atrial size was severely dilated.   4. Right atrial size was moderately dilated.    5. The mitral valve has been repaired/replaced. No evidence of mitral  valve regurgitation. There is a 27 mm [employer] Jude mechanical valve present in  the mitral position.   6. Tricuspid valve regurgitation is severe.   7. The aortic valve has been repaired/replaced. Aortic valve  regurgitation is not visualized. There is a 23 mm Sapien prosthetic (TAVR)  valve present in the aortic position. Procedure Date: 10/14/2021. Aortic  valve mean gradient measures 9.6 mmHg.   8. Aortic dilatation noted. There is mild dilatation of the ascending  aorta, measuring 40 mm.   9. The inferior vena cava is dilated  in size with <50% respiratory  variability, suggesting right atrial pressure of 15 mmHg.   Patient Profile     76 y.o. female with a hx of rheumatic fever with rheumatic heart disease s/p mechanical MVR, critical aortic stenosis s/p TAVR, paroxysmal atrial fibrillation on Coumadin, bradycardia status post PPM placement, CAD s/p PCI of LAD, COPD who is being seen 08/11/2022 for the evaluation of preop evaluation at the request of Dr. Katrinka Blazing.  Assessment & Plan    Right hip fracture Now s/p R THA - fall from her recliner - per ortho   HFrEF Hypertension LVEF 25-30% 09/2021 improved to 50-55% 11/2021 Biatrial enlargement Appears euvolemic Home diuretic regimen includes 25 mg HCTZ and 30 mg lasix Has not tolerated BB in the past, but now with PPM in place. Could consider titrating GDMT in OP setting once she has recovered   Critical AS s/p TAVR Echo 11/2021 with stable valve function, mean gradient 9.6 mmHg   Rheumatic mitral valve disease Chronic coumadin therapy S/P mechanical mitral valve at [facility] 20 years ago INR 4.6 on admission reversed with vitamin K and 2U FFP INR goal is 3, was 1.5 today Home medications indicate she was taking 325 mg ASA with coumadin. Will D/C ASA on home medications.   CAD s/p DES-LAD Heart cath 09/2021 with patent LAD stent and mild nonobstructive  disease.    PAF Bradycardia s/p dual chamber PPM She has cardizem 240 mg to take PRN for heart racing. She is not on a BB due to bradycardia in the past when placed on lopressor or atenolol. Will continue PRN medication as her EF has recovered.  V- pacing intermittently   Hypokalemia K 2.6 Replacing via IV and PO, per primary Monitor sodium 129 (135)      For questions or updates, please contact [facility] Please consult [email or web address] for contact info under        Signed, Marcelino Duster, PA  08/14/2022, 9:45 AM     Patient seen and examined. Agree with assessment and plan.  Patient feels well.  No chest pain or shortness of breath.   No JVD.  Lungs clear.  Intermittently paced rhythm; cardiac murmurs are unchanged.  Abdomen nontender.  No edema day 2 status post right hip arthroplasty.  She received prophylactic Lovenox dose yesterday with plan for today but with weight increase we will give 60 mg today rather than 40 mg per pharmacy.  Patient was started on warfarin yesterday.  No bleeding at surgical site.  Plan to change to 60 twice daily Lovenox tomorrow with Coumadin for therapeutic anticoagulation pending Coumadin goal of 2.5-3.5.   [name] Bihari, MD, [facility] 08/14/2022 1:26 PM

## 2022-08-14 NOTE — Progress Notes (Signed)
Physical Therapy Treatment Patient Details Name: [name] [name] MRN: [id number] DOB: [birth year] Today's Date: 08/14/2022   History of Present Illness Pt is 76 yo female who presents on 08/11/22 with a fall when trying to get up from recliner. She stated her RLE gave out under her. Sustained a R femoral neck fx and underwent R THA. PMH: CHF, GERD, TAVR, HTN, hypothyroid, MI.    PT Comments    Pt received seated on BSC, agreeable to therapy session for transfer training. Pt needing increased time and cues for safe technique for transfers but minimal insight into need for increased mobility to build strength/endurance and will need reinforcement. Pt needing modA to perform transfers/bed mobility. Updated frequency per dept protocol given pt disposition planned for SNF, discussed with supervising PT Alayna W. Pt continues to benefit from PT services to progress toward functional mobility goals.    Recommendations for follow up therapy are one component of a multi-disciplinary discharge planning process, led by the attending physician.  Recommendations may be updated based on patient status, additional functional criteria and insurance authorization.  Follow Up Recommendations  Skilled nursing-short term rehab (<3 hours/day) Can patient physically be transported by private vehicle: No   Assistance Recommended at Discharge Frequent or constant Supervision/Assistance  Patient can return home with the following A little help with bathing/dressing/bathroom;Assistance with cooking/housework;Assist for transportation;Help with stairs or ramp for entrance;Two people to help with walking and/or transfers   Equipment Recommendations  Rolling walker (2 wheels);BSC/3in1    Recommendations for Other Services OT consult     Precautions / Restrictions Precautions Precautions: Fall Restrictions Weight Bearing Restrictions: Yes RLE Weight Bearing: Weight bearing as tolerated     Mobility  Bed  Mobility Overal bed mobility: Needs Assistance Bed Mobility: Sit to Supine       Sit to supine: Mod assist   General bed mobility comments: received on BSC; modA to return with BLE assist and mod cues for safe technique    Transfers Overall transfer level: Needs assistance Equipment used: Rolling walker (2 wheels) Transfers: Sit to/from Stand Sit to Stand: Mod assist, From elevated surface           General transfer comment: modA for power up x2 reps from [facility], vc's for hand placement and sequencing    Ambulation/Gait Ambulation/Gait assistance: Mod assist Gait Distance (Feet): 3 Feet Assistive device: Rolling walker (2 wheels) Gait Pattern/deviations: Antalgic, Decreased step length - right, Decreased step length - left, Step-to pattern, Decreased weight shift to right Gait velocity: decreased     General Gait Details: pt keeps saying she can't put wt on RLE or step but meanwhile is doing so. Mod A for wt shifting and to steady for pivotal and side steps at EOB      Balance Overall balance assessment: Needs assistance, History of Falls Sitting-balance support: No upper extremity supported, Feet supported Sitting balance-Leahy Scale: Fair     Standing balance support: Bilateral upper extremity supported Standing balance-Leahy Scale: Poor Standing balance comment: heavy reliance on RW                            Cognition Arousal/Alertness: Awake/alert Behavior During Therapy: WFL for tasks assessed/performed Overall Cognitive Status: No family/caregiver present to determine baseline cognitive functioning                                 General  Comments: STM deficits noted, may be her baseline; tangential        Exercises Total Joint Exercises Ankle Circles/Pumps: AROM, Both, 10 reps, Seated Long Arc Quad: AAROM, Right, 10 reps, Seated Other Exercises: supine RLE AAROM: hip abduction, SLR, heel slides x5-10 reps ea   General  Comments General comments (skin integrity, edema, etc.): HR 70's bpm, SpO2 93-97% on RA (received on 2L but pulse ox not connected, when connected pt was at 100% on 2L)      Pertinent Vitals/Pain Pain Assessment Pain Assessment: Faces Faces Pain Scale: Hurts little more Pain Location: R hip Pain Descriptors / Indicators: Grimacing, Guarding, Sore Pain Intervention(s): Monitored during session, Repositioned, Limited activity within patient's tolerance           PT Goals (current goals can now be found in the care plan section) Acute Rehab PT Goals Patient Stated Goal: Eventual return home PT Goal Formulation: With patient Time For Goal Achievement: 08/27/22 Progress towards PT goals: Progressing toward goals    Frequency    Min 3X/week      PT Plan Current plan remains appropriate       AM-PAC PT "6 Clicks" Mobility   Outcome Measure  Help needed turning from your back to your side while in a flat bed without using bedrails?: A Lot Help needed moving from lying on your back to sitting on the side of a flat bed without using bedrails?: A Lot Help needed moving to and from a bed to a chair (including a wheelchair)?: A Lot Help needed standing up from a chair using your arms (e.g., wheelchair or bedside chair)?: A Lot Help needed to walk in hospital room?: Total Help needed climbing 3-5 steps with a railing? : Total 6 Click Score: 10    End of Session Equipment Utilized During Treatment: Gait belt;Oxygen (weaned from O2) Activity Tolerance: Patient tolerated treatment well;Patient limited by fatigue Patient left: with call bell/phone within reach;in bed;with bed alarm set;Other (comment) (heels floated) Nurse Communication: Mobility status;Precautions PT Visit Diagnosis: Unsteadiness on feet (R26.81);Muscle weakness (generalized) (M62.81);Repeated falls (R29.6);Difficulty in walking, not elsewhere classified (R26.2);Pain Pain - Right/Left: Right Pain - part of body:  Hip     Time: 1751-0258 PT Time Calculation (min) (ACUTE ONLY): 29 min  Charges:  $Therapeutic Exercise: 8-22 mins $Therapeutic Activity: 8-22 mins                     Dhanya Bogle P., PTA Acute Rehabilitation Services Secure Chat Preferred 9a-5:30pm Office: [phone]    Dorathy Kinsman [facility] 08/14/2022, 4:54 PM

## 2022-08-14 NOTE — Care Management Important Message (Signed)
Important Message  Patient Details  Name: [name] [name] MRN: [id number] Date of Birth: 05/07/1946   Medicare Important Message Given:  Yes     Hannah Beat 08/14/2022, 2:16 PM

## 2022-08-14 NOTE — Progress Notes (Signed)
Mobility Specialist Progress Note   08/14/22 0945  Pain Assessment  Pain Assessment 0-10  Pain Score 4  Faces Pain Scale 4  Pain Location R-hip  Pain Descriptors / Indicators Discomfort;Guarding;Sore  Pain Intervention(s) Limited activity within patient's tolerance  Mobility  Activity Transferred to/from BSC;Stood at bedside;Transferred from bed to chair  Level of Assistance Moderate assist, patient does 50-74%  Assistive Device Front wheel walker  Range of Motion/Exercises Active;All extremities  RLE Weight Bearing WBAT  Activity Response Tolerated well   Patient received in supine asleep and easily aroused. Slightly lethargic and reluctant initially from being tired but agreed to participate. Required mod A from supine to sit, more so assisting RLE to EOB. Was able to stand and transfer to [facility] with min A but required mod cues for hand/foot placement and sequencing. Tolerated without complaint or incident. Was left in recliner with all needs met, call bell in reach.   [place] Zahriyah Joo, [place], [place]  Office: [phone]

## 2022-08-14 NOTE — TOC Progression Note (Signed)
Transition of Care [facility]) - Progression Note    Patient Details  Name: [name] [name] MRN: [id number] Date of Birth: 07-Mar-1946  Transition of Care [facility]) CM/SW Contact  Joanne Chars, LCSW Phone Number: 08/14/2022, 11:18 AM  Clinical Narrative:   [facility] does make bed offer.  Pt informed and does want to accept.  Confirmed with Olivia Mackie that this is for [facility].    Per MD, not stable for DC for several more days.     Expected Discharge Plan: [place] Barriers to Discharge: Continued Medical Work up, SNF Pending bed offer  Expected Discharge Plan and Services Expected Discharge Plan: [place] In-house Referral: Clinical Social Work   Post Acute Care Choice: [place] Living arrangements for the past 2 months: [place] Determinants of Health (SDOH) Interventions    Readmission Risk Interventions    10/17/2021   12:17 PM  Readmission Risk Prevention Plan  Transportation Screening Complete  PCP or Specialist Appt within 3-5 Days Complete  Social Work Consult for [place] Planning/Counseling Complete  Palliative Care Screening Not Applicable  Medication Review [employer]) Complete

## 2022-08-14 NOTE — Progress Notes (Signed)
[facility]  PROGRESS NOTE  [name] [name] NIO:[id number] DOB: [birth year] DOA: 08/11/2022 PCP: Ronita Hipps, MD   Brief HPI:   76 year old female with medical history of hypertension, hyperlipidemia, paroxysmal atrial fibrillation on anticoagulation with warfarin, severe leukocytosis s/p TAVR in 2022, CHF with last EF of 50 to 55%, s/p mechanical mitral valve, status post pacemaker, hypothyroidism, AAA, GERD presented after having fall at home.  In the ED x-ray of the hip revealed minimally displaced fracture of right femoral neck.  Orthopedics was consulted.  Patient found to have elevated INR, vitamin K 3 mg and 2 units FFP ordered.    Subjective   Patient seen and examined, denies shortness of breath.   Assessment/Plan:     Right femoral neck fracture; secondary to mechanical fall -Status post right THA for femoral neck fracture -Continue hydrocodone/morphine as needed -Orthopedics following   Chronic systolic heart failure/critical AS s/p TAVR/rheumatic heart disease s/p mechanical MVR -Cardiology was consulted for preop evaluation -Patient started on Lovenox 40 mg subcu daily along with Coumadin per pharmacy; INR is 1.5 -On postop day 3 she can be started on Lovenox with Coumadin bridge as per cardiology  Hypothyroidism -Continue levothyroxine  Hyponatremia -Likely from HCTZ which was started yesterday -We will hold HCTZ at this time  Hypertension -Blood pressure is stable -Hold HCTZ; start hydralazine 25 mg p.o. every 6 hours as needed for BP more than 160/100.  Severe aortic stenosis s/p TAVR -S/p surgical repair in 12/22  Hypokalemia -Potassium is 2.6 this morning; likely from diuretics -Replace potassium and follow BMP in am   Medications     atorvastatin  80 mg Oral QHS   [START ON 08/15/2022] enoxaparin (LOVENOX) injection  60 mg Subcutaneous Q12H   feeding supplement  237 mL Oral BID BM   furosemide  30 mg Oral Daily   levothyroxine  75  mcg Oral QAC breakfast   lidocaine  1 patch Transdermal Q24H   senna-docusate  1 tablet Oral QHS   warfarin  7.5 mg Oral q1600   Warfarin - Pharmacist Dosing Inpatient   Does not apply q1600     Data Reviewed:   CBG:  No results for input(s): "GLUCAP" in the last 168 hours.  SpO2: 98 % O2 Flow Rate (L/min): 2 L/min    Vitals:   08/13/22 1602 08/13/22 2145 08/14/22 0500 08/14/22 0745  BP: 128/80 120/72  121/76  Pulse: 76 75  80  Resp:  20  19  Temp: 98.6 F (37 C) 100 F (37.8 C)  99.7 F (37.6 C)  TempSrc: Oral Oral  Oral  SpO2: 100% 97%  98%  Weight:   58.8 kg   Height:          Data Reviewed:  Basic Metabolic Panel: Recent Labs  Lab 08/11/22 0144 08/11/22 1507 08/12/22 0411 08/14/22 0439  NA 135 136 135 129*  K 2.4* 3.5 3.3* 2.6*  CL 95* 98 94* 88*  CO2 24 29 29 31   GLUCOSE 120* 132* 119* 111*  BUN 16 13 14 16   CREATININE 1.01* 0.83 0.87 0.77  CALCIUM 9.5 8.7* 8.8* 8.3*  MG 1.9  --   --   --     CBC: Recent Labs  Lab 08/11/22 0144 08/12/22 0411 08/13/22 0352 08/14/22 0439  WBC 7.4 6.8 10.6* 8.3  NEUTROABS 6.2  --   --   --   HGB 12.7 12.1 11.6* 10.0*  HCT 39.4 37.7 35.7* 29.3*  MCV 88.3 88.5 86.7 84.9  PLT 152 117* 126* 103*    LFT Recent Labs  Lab 08/11/22 0144  AST 31  ALT 18  ALKPHOS 111  BILITOT 0.8  PROT 7.4  ALBUMIN 4.0     Antibiotics: Anti-infectives (From admission, onward)    Start     Dose/Rate Route Frequency Ordered Stop   08/12/22 2200  ceFAZolin (ANCEF) IVPB 2g/100 mL premix        2 g 200 mL/hr over 30 Minutes Intravenous Every 8 hours 08/12/22 1841 08/13/22 2155   08/12/22 0600  ceFAZolin (ANCEF) IVPB 2g/100 mL premix        2 g 200 mL/hr over 30 Minutes Intravenous On call to O.R. 08/11/22 2215 08/12/22 1455        DVT prophylaxis: As per orthopedics  Code Status: Full code  Family Communication: No family at bedside   CONSULTS orthopedics, cardiology   Objective    Physical  Examination:  General-appears in no acute distress Heart-S1-S2, irregular, no murmur auscultated Lungs-clear to auscultation bilaterally, no wheezing or crackles auscultated Abdomen-soft, nontender, no organomegaly Extremities-no edema in the lower extremities Neuro-alert, oriented x3, no focal deficit noted   Status is: Inpatient:             Meredeth Ide   [facility] If 7PM-7AM, please contact night-coverage at [email or web address], Office  [phone]   08/14/2022, 2:42 PM  LOS: 3 days

## 2022-08-14 NOTE — Progress Notes (Signed)
     Subjective: Patient has had a tough time with PT post op. Able to do some walking around the room. Was also very drowsy this morning. Not given any meds for pain but now complaining of more pain this afternoon.  Denies distal n/t. No new issues.  Objective:   VITALS:   Vitals:   08/13/22 1602 08/13/22 2145 08/14/22 0500 08/14/22 0745  BP: 128/80 120/72  121/76  Pulse: 76 75  80  Resp:  20  19  Temp: 98.6 F (37 C) 100 F (37.8 C)  99.7 F (37.6 C)  TempSrc: Oral Oral  Oral  SpO2: 100% 97%  98%  Weight:   58.8 kg   Height:        Sensation intact distally Intact pulses distally Dorsiflexion/Plantar flexion intact Incision: dressing C/D/I No cellulitis present Compartment soft Minimal swelling about R hip  Lab Results  Component Value Date   WBC 8.3 08/14/2022   HGB 10.0 (L) 08/14/2022   HCT 29.3 (L) 08/14/2022   MCV 84.9 08/14/2022   PLT 103 (L) 08/14/2022   BMET    Component Value Date/Time   NA 129 (L) 08/14/2022 0439   NA 142 12/10/2021 1401   K 2.6 (LL) 08/14/2022 0439   CL 88 (L) 08/14/2022 0439   CO2 31 08/14/2022 0439   GLUCOSE 111 (H) 08/14/2022 0439   BUN 16 08/14/2022 0439   BUN 13 12/10/2021 1401   CREATININE 0.77 08/14/2022 0439   CALCIUM 8.3 (L) 08/14/2022 0439   EGFR 66 12/10/2021 1401   GFRNONAA >60 08/14/2022 0439      Xray: post op xrays demonstrate well positioned THA components no adverse features  Assessment/Plan: 2 Days Post-Op   Principal Problem:   Closed right hip fracture (HCC) Active Problems:   Presence of permanent cardiac pacemaker   Anxiety   Hypothyroidism   Chronic combined systolic and diastolic CHF (congestive heart failure) (HCC)   H/O mitral valve replacement with mechanical valve   S/P TAVR (transcatheter aortic valve replacement)   Hypokalemia   Supratherapeutic INR   PAF (paroxysmal atrial fibrillation) (HCC)   Fall at home, initial encounter  S/p R THA for femoral neck fracture  Post op  recs: WB: WBAT RLE, No formal hip precautions Abx: ancef Imaging: PACU pelvis Xray Dressing: Aquacell, keep intact until follow up DVT prophylaxis: Discussed with cardiology team well resume Coumadin postoperatively and bridged with Lovenox 40 daily starting postop day 1 until she is therapeutic on the Coumadin. Follow up: 2 weeks after surgery for a wound check with Dr. Zachery Dakins at [facility].  Address: [address] [place], [place], [place] [address]  Office Phone: [phone]  Willaim Sheng 08/14/2022, 3:28 PM   Charlies Constable, MD  Contact information:   [phone] 7am-5pm epic message Dr. Zachery Dakins, or call office for patient follow up: [phone] After hours and holidays please check [email or web address] for group call information for [facility]

## 2022-08-15 ENCOUNTER — Inpatient Hospital Stay (HOSPITAL_COMMUNITY): Payer: Medicare Other

## 2022-08-15 DIAGNOSIS — E039 Hypothyroidism, unspecified: Secondary | ICD-10-CM | POA: Diagnosis not present

## 2022-08-15 DIAGNOSIS — I480 Paroxysmal atrial fibrillation: Secondary | ICD-10-CM | POA: Diagnosis not present

## 2022-08-15 LAB — BASIC METABOLIC PANEL
Anion gap: 10 (ref 5–15)
BUN: 14 mg/dL (ref 8–23)
CO2: 29 mmol/L (ref 22–32)
Calcium: 8.3 mg/dL — ABNORMAL LOW (ref 8.9–10.3)
Chloride: 93 mmol/L — ABNORMAL LOW (ref 98–111)
Creatinine, Ser: 0.83 mg/dL (ref 0.44–1.00)
GFR, Estimated: >60 mL/min (ref >60)
Glucose, Bld: 101 mg/dL — ABNORMAL HIGH (ref 70–99)
Potassium: 3.2 mmol/L — ABNORMAL LOW (ref 3.5–5.1)
Sodium: 132 mmol/L — ABNORMAL LOW (ref 135–145)

## 2022-08-15 LAB — CBC
HCT: 27.6 % — ABNORMAL LOW (ref 36.0–46.0)
Hemoglobin: 9.2 g/dL — ABNORMAL LOW (ref 12.0–15.0)
MCH: 28.8 pg (ref 26.0–34.0)
MCHC: 33.3 g/dL (ref 30.0–36.0)
MCV: 86.3 fL (ref 80.0–100.0)
Platelets: 108 10*3/uL — ABNORMAL LOW (ref 150–400)
RBC: 3.2 MIL/uL — ABNORMAL LOW (ref 3.87–5.11)
RDW: 15.3 % (ref 11.5–15.5)
WBC: 6.6 10*3/uL (ref 4.0–10.5)
nRBC: 0 % (ref 0.0–0.2)

## 2022-08-15 LAB — PROTIME-INR
INR: 1.6 — ABNORMAL HIGH (ref 0.8–1.2)
Prothrombin Time: 19.1 seconds — ABNORMAL HIGH (ref 11.4–15.2)

## 2022-08-15 MED ORDER — POTASSIUM CHLORIDE CRYS ER 20 MEQ PO TBCR
40.0000 meq | EXTENDED_RELEASE_TABLET | Freq: Once | ORAL | Status: AC
  Administered 2022-08-15: 40 meq via ORAL
  Filled 2022-08-15: qty 2

## 2022-08-15 MED ORDER — MUSCLE RUB 10-15 % EX CREA
TOPICAL_CREAM | CUTANEOUS | Status: DC | PRN
  Administered 2022-08-16: 1 via TOPICAL
  Filled 2022-08-15: qty 85

## 2022-08-15 NOTE — Progress Notes (Signed)
[facility]  PROGRESS NOTE  [name] [name]:[id number] DOB: 1946-03-28 DOA: 08/11/2022 PCP: Marylen Ponto, MD   Brief HPI:   76 year old female with medical history of hypertension, hyperlipidemia, paroxysmal atrial fibrillation on anticoagulation with warfarin, severe leukocytosis s/p TAVR in 2022, CHF with last EF of 50 to 55%, s/p mechanical mitral valve, status post pacemaker, hypothyroidism, AAA, GERD presented after having fall at home.  In the ED x-ray of the hip revealed minimally displaced fracture of right femoral neck.  Orthopedics was consulted.  Patient found to have elevated INR, vitamin K 3 mg and 2 units FFP ordered.    Subjective   Patient seen and examined, no new complaints   Assessment/Plan:     Right femoral neck fracture; secondary to mechanical fall -Status post right THA for femoral neck fracture -Continue hydrocodone/morphine as needed -Orthopedics following   Chronic systolic heart failure/critical AS s/p TAVR/rheumatic heart disease s/p mechanical MVR -Cardiology was consulted for preop evaluation -Patient started on Lovenox 60 mg subcu every 12 hours along with Coumadin per pharmacy; INR is 1.6.  Goal INR is 3.0   Hypothyroidism -Continue levothyroxine  Hyponatremia -Likely from HCTZ which was started yesterday -HCTZ on hold -Sodium improved to 132  Hypertension -Blood pressure is stable -Hold HCTZ;  -Started on  hydralazine 25 mg p.o. every 6 hours as needed for BP more than 160/100.  Severe aortic stenosis s/p TAVR -S/p surgical repair in 12/22  Hypokalemia -Potassium is 3.2 this morning -We will replace potassium and follow BMP in am   Medications     atorvastatin  80 mg Oral QHS   enoxaparin (LOVENOX) injection  60 mg Subcutaneous Q12H   feeding supplement  237 mL Oral BID BM   furosemide  30 mg Oral Daily   levothyroxine  75 mcg Oral QAC breakfast   lidocaine  1 patch Transdermal Q24H   senna-docusate  1 tablet  Oral QHS   warfarin  7.5 mg Oral q1600   Warfarin - Pharmacist Dosing Inpatient   Does not apply q1600     Data Reviewed:   CBG:  No results for input(s): "GLUCAP" in the last 168 hours.  SpO2: 93 % O2 Flow Rate (L/min): 2 L/min    Vitals:   08/15/22 0421 08/15/22 0500 08/15/22 0800 08/15/22 1348  BP: (!) 131/95  130/80 121/72  Pulse: 68  72 78  Resp: 20  (!) 22 17  Temp: 99 F (37.2 C)  99.3 F (37.4 C) 99.9 F (37.7 C)  TempSrc: Oral  Oral Oral  SpO2: 90%   93%  Weight:  53.5 kg    Height:          Data Reviewed:  Basic Metabolic Panel: Recent Labs  Lab 08/11/22 0144 08/11/22 1507 08/12/22 0411 08/14/22 0439 08/15/22 0243  NA 135 136 135 129* 132*  K 2.4* 3.5 3.3* 2.6* 3.2*  CL 95* 98 94* 88* 93*  CO2 24 29 29 31 29   GLUCOSE 120* 132* 119* 111* 101*  BUN 16 13 14 16 14   CREATININE 1.01* 0.83 0.87 0.77 0.83  CALCIUM 9.5 8.7* 8.8* 8.3* 8.3*  MG 1.9  --   --   --   --     CBC: Recent Labs  Lab 08/11/22 0144 08/12/22 0411 08/13/22 0352 08/14/22 0439 08/15/22 0243  WBC 7.4 6.8 10.6* 8.3 6.6  NEUTROABS 6.2  --   --   --   --   HGB 12.7 12.1 11.6* 10.0* 9.2*  HCT 39.4 37.7 35.7* 29.3* 27.6*  MCV 88.3 88.5 86.7 84.9 86.3  PLT 152 117* 126* 103* 108*    LFT Recent Labs  Lab 08/11/22 0144  AST 31  ALT 18  ALKPHOS 111  BILITOT 0.8  PROT 7.4  ALBUMIN 4.0     Antibiotics: Anti-infectives (From admission, onward)    Start     Dose/Rate Route Frequency Ordered Stop   08/12/22 2200  ceFAZolin (ANCEF) IVPB 2g/100 mL premix        2 g 200 mL/hr over 30 Minutes Intravenous Every 8 hours 08/12/22 1841 08/13/22 2155   08/12/22 0600  ceFAZolin (ANCEF) IVPB 2g/100 mL premix        2 g 200 mL/hr over 30 Minutes Intravenous On call to O.R. 08/11/22 2215 08/12/22 1455        DVT prophylaxis: As per orthopedics  Code Status: Full code  Family Communication: No family at bedside   CONSULTS orthopedics, cardiology   Objective     Physical Examination:  General-appears in no acute distress Heart-S1-S2, regular, no murmur auscultated Lungs-clear to auscultation bilaterally, no wheezing or crackles auscultated Abdomen-soft, nontender, no organomegaly Extremities-no edema in the lower extremities Neuro-alert, oriented x3, no focal deficit noted  Status is: Inpatient:             [name]   [facility] If 7PM-7AM, please contact night-coverage at [email or web address], Office  [phone]   08/15/2022, 1:56 PM  LOS: 4 days

## 2022-08-15 NOTE — Plan of Care (Signed)
  Problem: Education: Goal: Knowledge of General Education information will improve Description: Including pain rating scale, medication(s)/side effects and non-pharmacologic comfort measures Outcome: Progressing   Problem: Health Behavior/Discharge Planning: Goal: Ability to manage health-related needs will improve Outcome: Progressing   Problem: Activity: Goal: Risk for activity intolerance will decrease Outcome: Progressing   Problem: Coping: Goal: Level of anxiety will decrease Outcome: Progressing   Problem: Elimination: Goal: Will not experience complications related to bowel motility Outcome: Progressing Goal: Will not experience complications related to urinary retention Outcome: Progressing   

## 2022-08-15 NOTE — Progress Notes (Signed)
ANTICOAGULATION CONSULT NOTE - Follow Up Consult  Pharmacy Consult for Warfarin Indication: atrial fibrillation and mechanical mitral valve  Allergies  Allergen Reactions   Atenolol Other (See Comments)    HR and pulse dropped   Metoprolol Succinate [Metoprolol] Other (See Comments)    HR and pulse dropped   Baclofen Anxiety and Other (See Comments)    Makes the patient feel anxious the day after taking this. "Makes me jerk"    Latex Rash    Patient Measurements: Height: 4\' 10"  (147.3 cm) Weight: 53.5 kg (117 lb 15.1 oz) IBW/kg (Calculated) : 40.9 Lovenox Dosing Weight: 52-58 kg  Vital Signs: Temp: 99.3 F (37.4 C) (10/28 0800) Temp Source: Oral (10/28 0800) BP: 130/80 (10/28 0800) Pulse Rate: 72 (10/28 0800)  Labs: Recent Labs    08/13/22 0352 08/13/22 0816 08/14/22 0439 08/15/22 0243  HGB 11.6*  --  10.0* 9.2*  HCT 35.7*  --  29.3* 27.6*  PLT 126*  --  103* 108*  LABPROT  --  16.0* 17.5* 19.1*  INR  --  1.3* 1.5* 1.6*  CREATININE  --   --  0.77 0.83     Estimated Creatinine Clearance: 42.4 mL/min (by C-G formula based on SCr of 0.83 mg/dL).  Assessment: 76 y/o female s/p R THA. PMH significant for mechanical mitral valve, PAF, and severe aortic stenosis s/p TAVR. Pt on warfarin PTA, 7.5 mg daily. Last dose reported as 10/23. INR 4.6 on admit 10/24 and reversed with Vitamin K 3 mg IV and 2 units FFP for surgery on 10/25.  Warfarin resumed POD#1 with home regimen of 7.5 mg daily. INR 1.3>>1.6 after 2 doses. Lovenox 40 mg given POD#1, 60 mg (~ 1 mg/kg) on POD#3 and 60 mg SQ Q12h begun this am, POD#3.  Weight has fluctuated some since admitted.  Hgb trended down, no bleeding reported.  Platelet count 152 on admit, range 103-126 post-op.   Goal of Therapy:  INR 2.5-3.5 Monitor platelets by anticoagulation protocol: Yes   Plan:  Continue Warfarin 7.5 mg daily. Continue Lovenox 60 mg SQ q12h. Follow up weights for any need to adjust Lovenox dose.  Daily PT/INR  and CBC. Continue Lovenox until INR >2.5. Monitor for signs/symptoms of bleeding.  Arty Baumgartner, RPh 08/15/2022,12:28 PM

## 2022-08-15 NOTE — Progress Notes (Signed)
ANTICOAGULATION CONSULT NOTE - Follow Up Consult  Pharmacy Consult for Warfarin Indication: atrial fibrillation and mechanical mitral valve  Allergies  Allergen Reactions   Atenolol Other (See Comments)    HR and pulse dropped   Metoprolol Succinate [Metoprolol] Other (See Comments)    HR and pulse dropped   Baclofen Anxiety and Other (See Comments)    Makes the patient feel anxious the day after taking this. "Makes me jerk"    Latex Rash    Patient Measurements: Height: 4\' 10"  (147.3 cm) Weight: 53.5 kg (117 lb 15.1 oz) IBW/kg (Calculated) : 40.9 Lovenox Dosing Weight: 52-58 kg  Vital Signs: Temp: 99.3 F (37.4 C) (10/28 0800) Temp Source: Oral (10/28 0800) BP: 130/80 (10/28 0800) Pulse Rate: 72 (10/28 0800)  Labs: Recent Labs    08/13/22 0352 08/13/22 0816 08/14/22 0439 08/15/22 0243  HGB 11.6*  --  10.0* 9.2*  HCT 35.7*  --  29.3* 27.6*  PLT 126*  --  103* 108*  LABPROT  --  16.0* 17.5* 19.1*  INR  --  1.3* 1.5* 1.6*  CREATININE  --   --  0.77 0.83    Estimated Creatinine Clearance: 42.4 mL/min (by C-G formula based on SCr of 0.83 mg/dL).  Assessment: 76 y/o female s/p R THA. PMH significant for mechanical mitral valve, PAF, and severe aortic stenosis s/p TAVR. Pt on warfarin PTA, 7.5 mg daily. Last dose reported as 10/23. INR 4.6 on admit 10/24 and reversed with Vitamin K 3mg  and 2 units FFP for surgery on 10/25.  Warfarin resumed POD#1 with home regimen of 7.5 mg daily. INR 1.3>>1.6 after 2 doses. Lovenox 40 mg given POD#1, 60 mg (~ 1 mg/kg) on POD#3 and 60 mg SQ Q12h begun this am, POD#3.  Weight has fluctuated some since admitted.  Hgb trended down, no bleeding reported.  Platelet count 152 on admit, range 103-126 post-op.   Goal of Therapy:  INR 2.5-3.5 Monitor platelets by anticoagulation protocol: Yes   Plan:  Continue Warfarin 7.5 mg daily. Continue Lovenox 60 mg SQ q12h. Follow up weights for any need to adjust Lovenox dose.  Daily PT/INR and  CBC. Continue Lovenox until INR >2.5. Monitor for signs/symptoms of bleeding.  Arty Baumgartner, RPh 08/15/2022,12:17 PM

## 2022-08-15 NOTE — Progress Notes (Signed)
   Chart reviewed.  No new cardiology suggestions.  Please call for questions over the weekend.    We will see again on Monday.

## 2022-08-15 NOTE — Progress Notes (Addendum)
     Subjective: [name] [name] is doing well this morning.  Pain well controlled.  Minimal soreness in the hip.  She does note pain in her lower back and her right foot.  She tolerates range of motion right hip right knee without any discomfort.  VITALS:   Vitals:   08/14/22 1900 08/14/22 1955 08/15/22 0421 08/15/22 0500  BP:  (!) 135/56 (!) 131/95   Pulse: 94 74 68   Resp:  20 20   Temp:  99 F (37.2 C) 99 F (37.2 C)   TempSrc:   Oral   SpO2:  98% 90%   Weight:    53.5 kg  Height:        Sensation intact distally Intact pulses distally Dorsiflexion/Plantar flexion intact Incision: dressing C/D/I No cellulitis present Compartment soft Minimal swelling about R hip  Lab Results  Component Value Date   WBC 6.6 08/15/2022   HGB 9.2 (L) 08/15/2022   HCT 27.6 (L) 08/15/2022   MCV 86.3 08/15/2022   PLT 108 (L) 08/15/2022   BMET    Component Value Date/Time   NA 132 (L) 08/15/2022 0243   NA 142 12/10/2021 1401   K 3.2 (L) 08/15/2022 0243   CL 93 (L) 08/15/2022 0243   CO2 29 08/15/2022 0243   GLUCOSE 101 (H) 08/15/2022 0243   BUN 14 08/15/2022 0243   BUN 13 12/10/2021 1401   CREATININE 0.83 08/15/2022 0243   CALCIUM 8.3 (L) 08/15/2022 0243   EGFR 66 12/10/2021 1401   GFRNONAA >60 08/15/2022 0243      Xray: post op xrays demonstrate well positioned THA components no adverse features  Assessment/Plan: 3 Days Post-Op   Principal Problem:   Closed right hip fracture (HCC) Active Problems:   Presence of permanent cardiac pacemaker   Anxiety   Hypothyroidism   Chronic combined systolic and diastolic CHF (congestive heart failure) (HCC)   H/O mitral valve replacement with mechanical valve   S/P TAVR (transcatheter aortic valve replacement)   Hypokalemia   Supratherapeutic INR   PAF (paroxysmal atrial fibrillation) ([place])   Fall at home, initial encounter  S/p R THA for femoral neck fracture  Complaining of right foot and ankle pain. tender about the foot  and lateral ankle.  X-rays ordered.    Post op recs: WB: WBAT RLE, No formal hip precautions Abx: ancef Imaging: PACU pelvis Xray Dressing: Aquacell, keep intact until follow up DVT prophylaxis: Discussed with cardiology team well resume Coumadin postoperatively and bridged with Lovenox 40 daily starting postop day 1 until she is therapeutic on the Coumadin. Follow up: 2 weeks after surgery for a wound check with Dr. Zachery Dakins at [facility].  Address: [address] [place], [place], [place] [address]  Office Phone: [phone]  Willaim Sheng 08/15/2022, 7:27 AM   Charlies Constable, MD  Contact information:   [phone] 7am-5pm epic message Dr. Zachery Dakins, or call office for patient follow up: [phone] After hours and holidays please check [email or web address] for group call information for [facility]

## 2022-08-16 DIAGNOSIS — Z7901 Long term (current) use of anticoagulants: Secondary | ICD-10-CM | POA: Diagnosis not present

## 2022-08-16 DIAGNOSIS — F419 Anxiety disorder, unspecified: Secondary | ICD-10-CM | POA: Diagnosis not present

## 2022-08-16 DIAGNOSIS — E039 Hypothyroidism, unspecified: Secondary | ICD-10-CM | POA: Diagnosis not present

## 2022-08-16 DIAGNOSIS — I480 Paroxysmal atrial fibrillation: Secondary | ICD-10-CM | POA: Diagnosis not present

## 2022-08-16 LAB — BASIC METABOLIC PANEL
Anion gap: 8 (ref 5–15)
BUN: 15 mg/dL (ref 8–23)
CO2: 31 mmol/L (ref 22–32)
Calcium: 8.3 mg/dL — ABNORMAL LOW (ref 8.9–10.3)
Chloride: 95 mmol/L — ABNORMAL LOW (ref 98–111)
Creatinine, Ser: 0.84 mg/dL (ref 0.44–1.00)
GFR, Estimated: >60 mL/min (ref >60)
Glucose, Bld: 123 mg/dL — ABNORMAL HIGH (ref 70–99)
Potassium: 3.3 mmol/L — ABNORMAL LOW (ref 3.5–5.1)
Sodium: 134 mmol/L — ABNORMAL LOW (ref 135–145)

## 2022-08-16 LAB — CBC
HCT: 28.9 % — ABNORMAL LOW (ref 36.0–46.0)
Hemoglobin: 9.3 g/dL — ABNORMAL LOW (ref 12.0–15.0)
MCH: 28.3 pg (ref 26.0–34.0)
MCHC: 32.2 g/dL (ref 30.0–36.0)
MCV: 87.8 fL (ref 80.0–100.0)
Platelets: 130 10*3/uL — ABNORMAL LOW (ref 150–400)
RBC: 3.29 MIL/uL — ABNORMAL LOW (ref 3.87–5.11)
RDW: 15.5 % (ref 11.5–15.5)
WBC: 5.3 10*3/uL (ref 4.0–10.5)
nRBC: 0 % (ref 0.0–0.2)

## 2022-08-16 LAB — PROTIME-INR
INR: 2 — ABNORMAL HIGH (ref 0.8–1.2)
Prothrombin Time: 22.6 seconds — ABNORMAL HIGH (ref 11.4–15.2)

## 2022-08-16 MED ORDER — POTASSIUM CHLORIDE CRYS ER 20 MEQ PO TBCR
40.0000 meq | EXTENDED_RELEASE_TABLET | Freq: Two times a day (BID) | ORAL | Status: AC
  Administered 2022-08-16 – 2022-08-17 (×2): 40 meq via ORAL
  Filled 2022-08-16 (×2): qty 2

## 2022-08-16 NOTE — Progress Notes (Signed)
[facility]  PROGRESS NOTE  [name] [name] LKG:[id number] DOB: Jul 14, 1946 DOA: 08/11/2022 PCP: [name] Hipps, MD   Brief HPI:   76 year old female with medical history of hypertension, hyperlipidemia, paroxysmal atrial fibrillation on anticoagulation with warfarin, severe leukocytosis s/p TAVR in 2022, CHF with last EF of 50 to 55%, s/p mechanical mitral valve, status post pacemaker, hypothyroidism, AAA, GERD presented after having fall at home.  In the ED x-ray of the hip revealed minimally displaced fracture of right femoral neck.  Orthopedics was consulted.  Patient found to have elevated INR, vitamin K 3 mg and 2 units FFP ordered.    Subjective   Patient seen and examined, denies any complaints.  Awaiting bed at skilled nursing facility   Assessment/Plan:     Right femoral neck fracture; secondary to mechanical fall -Status post right THA for femoral neck fracture -Continue hydrocodone/morphine as needed -Orthopedics following   Chronic systolic heart failure/critical AS s/p TAVR/rheumatic heart disease s/p mechanical MVR -Cardiology was consulted for preop evaluation -Patient started on Lovenox 60 mg subcu every 12 hours along with Coumadin per pharmacy; INR is 2.0.  Goal INR is 3.0   Hypothyroidism -Continue levothyroxine  Hyponatremia -Likely from HCTZ which was started yesterday -HCTZ on hold -Sodium improved to 134  Hypertension -Blood pressure is stable -Hold HCTZ;  -Started on  hydralazine 25 mg p.o. every 6 hours as needed for BP more than 160/100.  Severe aortic stenosis s/p TAVR -S/p surgical repair in 12/22  Hypokalemia -Potassium is 3.4 this morning -We will replace potassium and follow BMP in am   Medications     atorvastatin  80 mg Oral QHS   enoxaparin (LOVENOX) injection  60 mg Subcutaneous Q12H   feeding supplement  237 mL Oral BID BM   furosemide  30 mg Oral Daily   levothyroxine  75 mcg Oral QAC breakfast   lidocaine  1  patch Transdermal Q24H   senna-docusate  1 tablet Oral QHS   warfarin  7.5 mg Oral q1600   Warfarin - Pharmacist Dosing Inpatient   Does not apply q1600     Data Reviewed:   CBG:  No results for input(s): "GLUCAP" in the last 168 hours.  SpO2: 94 % O2 Flow Rate (L/min): 2 L/min    Vitals:   08/15/22 0800 08/15/22 1348 08/15/22 1957 08/16/22 0900  BP: 130/80 121/72 (!) 132/99 120/67  Pulse: 72 78 81 71  Resp: (!) 22 17 (!) 21 (!) 21  Temp: 99.3 F (37.4 C) 99.9 F (37.7 C) 100 F (37.8 C) 97.7 F (36.5 C)  TempSrc: Oral Oral Oral Oral  SpO2:  93% 97% 94%  Weight:      Height:          Data Reviewed:  Basic Metabolic Panel: Recent Labs  Lab 08/11/22 0144 08/11/22 1507 08/12/22 0411 08/14/22 0439 08/15/22 0243 08/16/22 0320  NA 135 136 135 129* 132* 134*  K 2.4* 3.5 3.3* 2.6* 3.2* 3.3*  CL 95* 98 94* 88* 93* 95*  CO2 24 29 29 31 29 31   GLUCOSE 120* 132* 119* 111* 101* 123*  BUN 16 13 14 16 14 15   CREATININE 1.01* 0.83 0.87 0.77 0.83 0.84  CALCIUM 9.5 8.7* 8.8* 8.3* 8.3* 8.3*  MG 1.9  --   --   --   --   --     CBC: Recent Labs  Lab 08/11/22 0144 08/12/22 0411 08/13/22 0352 08/14/22 0439 08/15/22 0243 08/16/22 0320  WBC 7.4  6.8 10.6* 8.3 6.6 5.3  NEUTROABS 6.2  --   --   --   --   --   HGB 12.7 12.1 11.6* 10.0* 9.2* 9.3*  HCT 39.4 37.7 35.7* 29.3* 27.6* 28.9*  MCV 88.3 88.5 86.7 84.9 86.3 87.8  PLT 152 117* 126* 103* 108* 130*    LFT Recent Labs  Lab 08/11/22 0144  AST 31  ALT 18  ALKPHOS 111  BILITOT 0.8  PROT 7.4  ALBUMIN 4.0     Antibiotics: Anti-infectives (From admission, onward)    Start     Dose/Rate Route Frequency Ordered Stop   08/12/22 2200  ceFAZolin (ANCEF) IVPB 2g/100 mL premix        2 g 200 mL/hr over 30 Minutes Intravenous Every 8 hours 08/12/22 1841 08/13/22 2155   08/12/22 0600  ceFAZolin (ANCEF) IVPB 2g/100 mL premix        2 g 200 mL/hr over 30 Minutes Intravenous On call to O.R. 08/11/22 2215 08/12/22 1455         DVT prophylaxis: As per orthopedics  Code Status: Full code  Family Communication: No family at bedside   CONSULTS orthopedics, cardiology   Objective    Physical Examination:  General-appears in no acute distress Heart-S1-S2, regular, no murmur auscultated Lungs-clear to auscultation bilaterally, no wheezing or crackles auscultated Abdomen-soft, nontender, no organomegaly Extremities-no edema in the lower extremities Neuro-alert, oriented x3, no focal deficit noted  Status is: Inpatient:             [name]   [facility] If 7PM-7AM, please contact night-coverage at [email or web address], Office  [phone]   08/16/2022, 2:03 PM  LOS: 5 days

## 2022-08-16 NOTE — TOC Progression Note (Signed)
Transition of Care [facility]) - Progression Note    Patient Details  Name: KJERSTIN ABRIGO MRN: [id number] Date of Birth: 1945-11-21  Transition of Care [facility]) CM/SW Contact  [address], [place], [place] Phone Number: 08/16/2022, 10:57 AM  Clinical Narrative:    SNF authorization started through [facility] ref# [id number]. Clinicals faxed to [phone]  [facility], LCSW Transition of Care     Expected Discharge Plan: Skilled Nursing Facility Barriers to Discharge: Continued Medical Work up, SNF Pending bed offer  Expected Discharge Plan and Services Expected Discharge Plan: [place] In-house Referral: Clinical Social Work   Post Acute Care Choice: [place] arrangements for the past 2 months: [place] Determinants of Health (SDOH) Interventions    Readmission Risk Interventions    10/17/2021   12:17 PM  Readmission Risk Prevention Plan  Transportation Screening Complete  PCP or Specialist Appt within 3-5 Days Complete  Social Work Consult for [place] Planning/Counseling Complete  Palliative Care Screening Not Applicable  Medication Review Press photographer) Complete

## 2022-08-16 NOTE — Progress Notes (Signed)
ANTICOAGULATION CONSULT NOTE - Follow Up Consult  Pharmacy Consult for Warfarin Indication: atrial fibrillation and mechanical mitral valve  Allergies  Allergen Reactions   Atenolol Other (See Comments)    HR and pulse dropped   Metoprolol Succinate [Metoprolol] Other (See Comments)    HR and pulse dropped   Baclofen Anxiety and Other (See Comments)    Makes the patient feel anxious the day after taking this. "Makes me jerk"    Latex Rash    Patient Measurements: Height: 4\' 10"  (147.3 cm) Weight: 53.5 kg (117 lb 15.1 oz) IBW/kg (Calculated) : 40.9 Lovenox Dosing Weight: 52-58 kg  Vital Signs:    Labs: Recent Labs    08/14/22 0439 08/15/22 0243 08/16/22 0320  HGB 10.0* 9.2* 9.3*  HCT 29.3* 27.6* 28.9*  PLT 103* 108* 130*  LABPROT 17.5* 19.1* 22.6*  INR 1.5* 1.6* 2.0*  CREATININE 0.77 0.83 0.84     Estimated Creatinine Clearance: 41.9 mL/min (by C-G formula based on SCr of 0.84 mg/dL).  Assessment: 76 y/o female s/p R THA. PMH significant for mechanical mitral valve, PAF, and severe aortic stenosis s/p TAVR. Pt on warfarin PTA, 7.5 mg daily. Last dose reported as 10/23. INR 4.6 on admit 10/24 and reversed with Vitamin K 3 mg IV and 2 units FFP for surgery on 10/25.  Warfarin resumed POD#1 with home regimen of 7.5 mg daily. INR 1.3>>2.0 after 3 doses. Lovenox 40 mg given POD#1, 60 mg (~ 1 mg/kg) on POD#2 and 60 mg SQ Q12h begun 10/28, POD#3.  Weight has fluctuated some since admitted.  Hgb trended down, no further drop today. No bleeding reported.  Platelet count 152 on admit, range 103-130 post-op.   Goal of Therapy:  INR 2.5-3.5 Monitor platelets by anticoagulation protocol: Yes   Plan:  Continue Warfarin 7.5 mg daily. Continue Lovenox 60 mg SQ q12h. Follow up weights for any need to adjust Lovenox dose.  Daily PT/INR and CBC. Continue Lovenox until INR at least >2.5, goal noted 3.0 Monitor for signs/symptoms of bleeding.  Arty Baumgartner,  [place] 08/16/2022,9:06 AM

## 2022-08-16 NOTE — Progress Notes (Signed)
     Subjective: [name] [name] is doing well this morning.  Notes improvement with pain and mobility.  She states she was up a lot yesterday.  Still concerned that she is unable to mobilize adequately for discharge home.  Plan for discharge to SNF.  VITALS:   Vitals:   08/15/22 0800 08/15/22 1348 08/15/22 1957 08/16/22 0900  BP: 130/80 121/72 (!) 132/99 120/67  Pulse: 72 78 81 71  Resp: (!) 22 17 (!) 21 (!) 21  Temp: 99.3 F (37.4 C) 99.9 F (37.7 C) 100 F (37.8 C) 97.7 F (36.5 C)  TempSrc: Oral Oral Oral Oral  SpO2:  93% 97% 94%  Weight:      Height:        Sensation intact distally Intact pulses distally Dorsiflexion/Plantar flexion intact Incision: dressing C/D/I No cellulitis present Compartment soft Minimal swelling about R hip  Lab Results  Component Value Date   WBC 5.3 08/16/2022   HGB 9.3 (L) 08/16/2022   HCT 28.9 (L) 08/16/2022   MCV 87.8 08/16/2022   PLT 130 (L) 08/16/2022   BMET    Component Value Date/Time   NA 134 (L) 08/16/2022 0320   NA 142 12/10/2021 1401   K 3.3 (L) 08/16/2022 0320   CL 95 (L) 08/16/2022 0320   CO2 31 08/16/2022 0320   GLUCOSE 123 (H) 08/16/2022 0320   BUN 15 08/16/2022 0320   BUN 13 12/10/2021 1401   CREATININE 0.84 08/16/2022 0320   CALCIUM 8.3 (L) 08/16/2022 0320   EGFR 66 12/10/2021 1401   GFRNONAA >60 08/16/2022 0320      Xray: post op xrays demonstrate well positioned THA components no adverse features  Assessment/Plan: 4 Days Post-Op   Principal Problem:   Closed right hip fracture (HCC) Active Problems:   Presence of permanent cardiac pacemaker   Anxiety   Hypothyroidism   Chronic combined systolic and diastolic CHF (congestive heart failure) (HCC)   H/O mitral valve replacement with mechanical valve   S/P TAVR (transcatheter aortic valve replacement)   Hypokalemia   Supratherapeutic INR   PAF (paroxysmal atrial fibrillation) (HCC)   Fall at home, initial encounter  S/p R THA for femoral neck  fracture  Complaining of right foot and ankle pain. tender about the foot and lateral ankle.  X-rays negative for fracture   Post op recs: WB: WBAT RLE, No formal hip precautions Abx: ancef Imaging: PACU pelvis Xray Dressing: Aquacell, keep intact until follow up DVT prophylaxis: Discussed with cardiology team well resume Coumadin postoperatively and bridged with Lovenox 40 daily starting postop day 1 until she is therapeutic on the Coumadin. Follow up: 2 weeks after surgery for a wound check with Dr. Zachery Dakins at [facility].  Address: [address] [place], [place], [place] [address]  Office Phone: [phone]  Willaim Sheng 08/16/2022, 10:11 AM   Charlies Constable, MD  Contact information:   [phone] 7am-5pm epic message Dr. Zachery Dakins, or call office for patient follow up: [phone] After hours and holidays please check [email or web address] for group call information for [facility]

## 2022-08-17 DIAGNOSIS — I480 Paroxysmal atrial fibrillation: Secondary | ICD-10-CM | POA: Diagnosis not present

## 2022-08-17 DIAGNOSIS — E039 Hypothyroidism, unspecified: Secondary | ICD-10-CM | POA: Diagnosis not present

## 2022-08-17 LAB — CBC
HCT: 28.7 % — ABNORMAL LOW (ref 36.0–46.0)
Hemoglobin: 9.3 g/dL — ABNORMAL LOW (ref 12.0–15.0)
MCH: 28.7 pg (ref 26.0–34.0)
MCHC: 32.4 g/dL (ref 30.0–36.0)
MCV: 88.6 fL (ref 80.0–100.0)
Platelets: 155 10*3/uL (ref 150–400)
RBC: 3.24 MIL/uL — ABNORMAL LOW (ref 3.87–5.11)
RDW: 15.8 % — ABNORMAL HIGH (ref 11.5–15.5)
WBC: 5 10*3/uL (ref 4.0–10.5)
nRBC: 0 % (ref 0.0–0.2)

## 2022-08-17 LAB — BASIC METABOLIC PANEL
Anion gap: 10 (ref 5–15)
BUN: 13 mg/dL (ref 8–23)
CO2: 30 mmol/L (ref 22–32)
Calcium: 8.5 mg/dL — ABNORMAL LOW (ref 8.9–10.3)
Chloride: 94 mmol/L — ABNORMAL LOW (ref 98–111)
Creatinine, Ser: 0.81 mg/dL (ref 0.44–1.00)
GFR, Estimated: >60 mL/min (ref >60)
Glucose, Bld: 106 mg/dL — ABNORMAL HIGH (ref 70–99)
Potassium: 3.9 mmol/L (ref 3.5–5.1)
Sodium: 134 mmol/L — ABNORMAL LOW (ref 135–145)

## 2022-08-17 LAB — PROTIME-INR
INR: 2.5 — ABNORMAL HIGH (ref 0.8–1.2)
Prothrombin Time: 27.1 seconds — ABNORMAL HIGH (ref 11.4–15.2)

## 2022-08-17 NOTE — Progress Notes (Signed)
[facility]  PROGRESS NOTE  [name] [name] VHQ:[id number] DOB: 1946-09-20 DOA: 08/11/2022 PCP: Marylen Ponto, MD   Brief HPI:   76 year old female with medical history of hypertension, hyperlipidemia, paroxysmal atrial fibrillation on anticoagulation with warfarin, severe leukocytosis s/p TAVR in 2022, CHF with last EF of 50 to 55%, s/p mechanical mitral valve, status post pacemaker, hypothyroidism, AAA, GERD presented after having fall at home.  In the ED x-ray of the hip revealed minimally displaced fracture of right femoral neck.  Orthopedics was consulted.  Patient found to have elevated INR, vitamin K 3 mg and 2 units FFP ordered.    Subjective   Patient seen and examined, denies any complaints.  No shortness of breath or chest pain.  Has been ambulating well in the hallway with physical therapist.   Assessment/Plan:     Right femoral neck fracture; secondary to mechanical fall -Status post right THA for femoral neck fracture -Continue hydrocodone/morphine as needed -Orthopedics following   Chronic systolic heart failure/critical AS s/p TAVR/rheumatic heart disease s/p mechanical MVR -Cardiology was consulted for preop evaluation -Patient started on Lovenox 60 mg subcu every 12 hours along with Coumadin per pharmacy; INR is 2.5.  Goal INR is 3.0   Hypothyroidism -Continue levothyroxine  Hyponatremia -Likely from HCTZ which was started yesterday -HCTZ on hold -Sodium improved to 134  Hypertension -Blood pressure is stable -Hold HCTZ;  -Started on  hydralazine 25 mg p.o. every 6 hours as needed for BP more than 160/100.  Severe aortic stenosis s/p TAVR -S/p surgical repair in 12/22  Hypokalemia -Replete   Medications     atorvastatin  80 mg Oral QHS   enoxaparin (LOVENOX) injection  60 mg Subcutaneous Q12H   feeding supplement  237 mL Oral BID BM   furosemide  30 mg Oral Daily   levothyroxine  75 mcg Oral QAC breakfast   lidocaine  1 patch  Transdermal Q24H   senna-docusate  1 tablet Oral QHS   warfarin  7.5 mg Oral q1600   Warfarin - Pharmacist Dosing Inpatient   Does not apply q1600     Data Reviewed:   CBG:  No results for input(s): "GLUCAP" in the last 168 hours.  SpO2: 97 % O2 Flow Rate (L/min): 2 L/min    Vitals:   08/15/22 1957 08/16/22 0900 08/16/22 2101 08/17/22 0800  BP: (!) 132/99 120/67 (!) 132/59 122/69  Pulse: 81 71 80 65  Resp: (!) 21 (!) 21 20 18   Temp: 100 F (37.8 C) 97.7 F (36.5 C) (!) 100.4 F (38 C) 97.7 F (36.5 C)  TempSrc: Oral Oral Oral Oral  SpO2: 97% 94% 97%   Weight:      Height:          Data Reviewed:  Basic Metabolic Panel: Recent Labs  Lab 08/11/22 0144 08/11/22 1507 08/12/22 0411 08/14/22 0439 08/15/22 0243 08/16/22 0320 08/17/22 0309  NA 135   < > 135 129* 132* 134* 134*  K 2.4*   < > 3.3* 2.6* 3.2* 3.3* 3.9  CL 95*   < > 94* 88* 93* 95* 94*  CO2 24   < > 29 31 29 31 30   GLUCOSE 120*   < > 119* 111* 101* 123* 106*  BUN 16   < > 14 16 14 15 13   CREATININE 1.01*   < > 0.87 0.77 0.83 0.84 0.81  CALCIUM 9.5   < > 8.8* 8.3* 8.3* 8.3* 8.5*  MG 1.9  --   --   --   --   --   --    < > =  values in this interval not displayed.    CBC: Recent Labs  Lab 08/11/22 0144 08/12/22 0411 08/13/22 0352 08/14/22 0439 08/15/22 0243 08/16/22 0320 08/17/22 0309  WBC 7.4   < > 10.6* 8.3 6.6 5.3 5.0  NEUTROABS 6.2  --   --   --   --   --   --   HGB 12.7   < > 11.6* 10.0* 9.2* 9.3* 9.3*  HCT 39.4   < > 35.7* 29.3* 27.6* 28.9* 28.7*  MCV 88.3   < > 86.7 84.9 86.3 87.8 88.6  PLT 152   < > 126* 103* 108* 130* 155   < > = values in this interval not displayed.    LFT Recent Labs  Lab 08/11/22 0144  AST 31  ALT 18  ALKPHOS 111  BILITOT 0.8  PROT 7.4  ALBUMIN 4.0     Antibiotics: Anti-infectives (From admission, onward)    Start     Dose/Rate Route Frequency Ordered Stop   08/12/22 2200  ceFAZolin (ANCEF) IVPB 2g/100 mL premix        2 g 200 mL/hr over 30  Minutes Intravenous Every 8 hours 08/12/22 1841 08/13/22 2155   08/12/22 0600  ceFAZolin (ANCEF) IVPB 2g/100 mL premix        2 g 200 mL/hr over 30 Minutes Intravenous On call to O.R. 08/11/22 2215 08/12/22 1455        DVT prophylaxis: As per orthopedics  Code Status: Full code  Family Communication: No family at bedside   CONSULTS orthopedics, cardiology   Objective    Physical Examination:  General-appears in no acute distress Heart-S1-S2, regular, no murmur auscultated Lungs-clear to auscultation bilaterally, no wheezing or crackles auscultated Abdomen-soft, nontender, no organomegaly Extremities-no edema in the lower extremities Neuro-alert, oriented x3, no focal deficit noted  Status is: Inpatient:           [name]   [facility] If 7PM-7AM, please contact night-coverage at [email or web address], Office  [phone]   08/17/2022, 2:05 PM  LOS: 6 days

## 2022-08-17 NOTE — TOC Progression Note (Signed)
Transition of Care [facility]) - Progression Note    Patient Details  Name: VALARIA KOHUT MRN: [id number] Date of Birth: Jul 15, 1946  Transition [facility]) CM/SW Contact  Joanne Chars, LCSW Phone Number: 08/17/2022, 1:33 PM  Clinical Narrative:  SNF auth request still pending in navi.     Expected Discharge Plan: [place] Barriers to Discharge: Continued Medical Work up, SNF Pending bed offer  Expected Discharge Plan and Services Expected Discharge Plan: [place] In-house Referral: Clinical Social Work   Post Acute Care Choice: [place] Living arrangements for the past 2 months: [place] Determinants of Health (SDOH) Interventions    Readmission Risk Interventions    10/17/2021   12:17 PM  Readmission Risk Prevention Plan  Transportation Screening Complete  PCP or Specialist Appt within 3-5 Days Complete  Social Work Consult for [place] Planning/Counseling Complete  Palliative Care Screening Not Applicable  Medication Review Press photographer) Complete

## 2022-08-17 NOTE — Progress Notes (Signed)
Physical Therapy Treatment Patient Details Name: [name] [name] MRN: [id number] DOB: 1945/10/23 Today's Date: 08/17/2022   History of Present Illness Pt is 76 yo female who presents on 08/11/22 with a fall when trying to get up from recliner. She stated her RLE gave out under her. Sustained a R femoral neck fx and underwent R THA. PMH: CHF, GERD, TAVR, HTN, hypothyroid, MI.    PT Comments    Pt is tolerating mobility better today. Continue to recommend SNF as pt not ambulating safe enough to be home alone. Pt continue to need mod A for sit>stand but was able to ambulate 50' with RW and min A. Pt tolerating wt on RLE better this day than last session. Pt performed seated there ex after ambulation. PT will continue to follow.    Recommendations for follow up therapy are one component of a multi-disciplinary discharge planning process, led by the attending physician.  Recommendations may be updated based on patient status, additional functional criteria and insurance authorization.  Follow Up Recommendations  Skilled nursing-short term rehab (<3 hours/day) Can patient physically be transported by private vehicle: No   Assistance Recommended at Discharge Frequent or constant Supervision/Assistance  Patient can return home with the following A little help with bathing/dressing/bathroom;Assistance with cooking/housework;Assist for transportation;Help with stairs or ramp for entrance;A little help with walking and/or transfers   Equipment Recommendations  Rolling walker (2 wheels);BSC/3in1    Recommendations for Other Services OT consult     Precautions / Restrictions Precautions Precautions: Fall Restrictions Weight Bearing Restrictions: Yes RUE Weight Bearing: Weight bearing as tolerated RLE Weight Bearing: Weight bearing as tolerated     Mobility  Bed Mobility               General bed mobility comments: pt in recliner    Transfers Overall transfer level: Needs  assistance Equipment used: Rolling walker (2 wheels) Transfers: Sit to/from Stand Sit to Stand: Min assist           General transfer comment: vc's for hand placement, min A for power up, second time min guard    Ambulation/Gait Ambulation/Gait assistance: Min assist Gait Distance (Feet): 50 Feet Assistive device: Rolling walker (2 wheels) Gait Pattern/deviations: Antalgic, Decreased step length - right, Decreased step length - left, Step-to pattern, Decreased weight shift to right, Narrow base of support Gait velocity: decreased Gait velocity interpretation: <1.8 ft/sec, indicate of risk for recurrent falls   General Gait Details: pt moving much better today and able to put wt on RLE without too much discomfort. pt excited to be able to walk into hallway. Min A to steady   Marine scientist [place] (Stroke Patients Only)       Balance Overall balance assessment: Needs assistance, History of Falls Sitting-balance support: No upper extremity supported, Feet supported Sitting balance-Leahy Scale: Fair     Standing balance support: Bilateral upper extremity supported Standing balance-Leahy Scale: Poor Standing balance comment: needs UE support                            Cognition Arousal/Alertness: Awake/alert Behavior During Therapy: WFL for tasks assessed/performed Overall Cognitive Status: No family/caregiver present to determine baseline cognitive functioning  General Comments: pt remembering instructions from last session and appropriate throughout.  Expect she is at baseline        Exercises Total Joint Exercises Ankle Circles/Pumps: AROM, Both, 10 reps, Seated Quad Sets: AROM, Both, 10 reps, Seated Gluteal Sets: AROM, Both, 10 reps, Seated Heel Slides: AAROM, Right, 5 reps, Seated Hip ABduction/ADduction: AAROM, Right, 10 reps, Seated Straight Leg Raises:  AAROM, Right, 10 reps, Seated Long Arc Quad: Right, 10 reps, Seated, AROM    General Comments General comments (skin integrity, edema, etc.): VSS.      Pertinent Vitals/Pain Pain Assessment Pain Assessment: Faces Faces Pain Scale: Hurts little more Pain Location: R hip Pain Descriptors / Indicators: Sore Pain Intervention(s): Limited activity within patient's tolerance, Monitored during session    Home Living                          Prior Function            PT Goals (current goals can now be found in the care plan section) Acute Rehab PT Goals Patient Stated Goal: Eventual return home PT Goal Formulation: With patient Time For Goal Achievement: 08/27/22 Potential to Achieve Goals: Good Progress towards PT goals: Progressing toward goals    Frequency    Min 3X/week      PT Plan Current plan remains appropriate    Co-evaluation              AM-PAC PT "6 Clicks" Mobility   Outcome Measure  Help needed turning from your back to your side while in a flat bed without using bedrails?: A Little Help needed moving from lying on your back to sitting on the side of a flat bed without using bedrails?: A Little Help needed moving to and from a bed to a chair (including a wheelchair)?: A Little Help needed standing up from a chair using your arms (e.g., wheelchair or bedside chair)?: A Lot Help needed to walk in hospital room?: A Lot Help needed climbing 3-5 steps with a railing? : Total 6 Click Score: 14    End of Session Equipment Utilized During Treatment: Gait belt Activity Tolerance: Patient tolerated treatment well Patient left: with call bell/phone within reach;in chair;with chair alarm set Nurse Communication: Mobility status PT Visit Diagnosis: Unsteadiness on feet (R26.81);Muscle weakness (generalized) (M62.81);Repeated falls (R29.6);Difficulty in walking, not elsewhere classified (R26.2);Pain Pain - Right/Left: Right Pain - part of body:  Hip     Time: [id number] PT Time Calculation (min) (ACUTE ONLY): 35 min  Charges:  $Gait Training: 8-22 mins $Therapeutic Exercise: 8-22 mins                     Leighton Roach, PT  Acute Rehab Services Secure chat preferred Office [place] 08/17/2022, 1:34 PM

## 2022-08-18 DIAGNOSIS — E785 Hyperlipidemia, unspecified: Secondary | ICD-10-CM | POA: Diagnosis not present

## 2022-08-18 DIAGNOSIS — K219 Gastro-esophageal reflux disease without esophagitis: Secondary | ICD-10-CM | POA: Diagnosis not present

## 2022-08-18 DIAGNOSIS — M25551 Pain in right hip: Secondary | ICD-10-CM | POA: Diagnosis not present

## 2022-08-18 DIAGNOSIS — I252 Old myocardial infarction: Secondary | ICD-10-CM | POA: Diagnosis not present

## 2022-08-18 DIAGNOSIS — M810 Age-related osteoporosis without current pathological fracture: Secondary | ICD-10-CM | POA: Diagnosis not present

## 2022-08-18 DIAGNOSIS — S72001D Fracture of unspecified part of neck of right femur, subsequent encounter for closed fracture with routine healing: Secondary | ICD-10-CM | POA: Diagnosis not present

## 2022-08-18 DIAGNOSIS — I10 Essential (primary) hypertension: Secondary | ICD-10-CM | POA: Diagnosis not present

## 2022-08-18 DIAGNOSIS — R531 Weakness: Secondary | ICD-10-CM | POA: Diagnosis not present

## 2022-08-18 DIAGNOSIS — M6281 Muscle weakness (generalized): Secondary | ICD-10-CM | POA: Diagnosis not present

## 2022-08-18 DIAGNOSIS — Z5181 Encounter for therapeutic drug level monitoring: Secondary | ICD-10-CM | POA: Diagnosis not present

## 2022-08-18 DIAGNOSIS — Z950 Presence of cardiac pacemaker: Secondary | ICD-10-CM | POA: Diagnosis not present

## 2022-08-18 DIAGNOSIS — R2681 Unsteadiness on feet: Secondary | ICD-10-CM | POA: Diagnosis not present

## 2022-08-18 DIAGNOSIS — D649 Anemia, unspecified: Secondary | ICD-10-CM | POA: Diagnosis not present

## 2022-08-18 DIAGNOSIS — Z7901 Long term (current) use of anticoagulants: Secondary | ICD-10-CM | POA: Diagnosis not present

## 2022-08-18 DIAGNOSIS — E039 Hypothyroidism, unspecified: Secondary | ICD-10-CM | POA: Diagnosis not present

## 2022-08-18 DIAGNOSIS — S72002D Fracture of unspecified part of neck of left femur, subsequent encounter for closed fracture with routine healing: Secondary | ICD-10-CM | POA: Diagnosis not present

## 2022-08-18 DIAGNOSIS — I480 Paroxysmal atrial fibrillation: Secondary | ICD-10-CM | POA: Diagnosis not present

## 2022-08-18 DIAGNOSIS — E440 Moderate protein-calorie malnutrition: Secondary | ICD-10-CM | POA: Diagnosis not present

## 2022-08-18 DIAGNOSIS — Z7401 Bed confinement status: Secondary | ICD-10-CM | POA: Diagnosis not present

## 2022-08-18 DIAGNOSIS — K5900 Constipation, unspecified: Secondary | ICD-10-CM | POA: Diagnosis not present

## 2022-08-18 DIAGNOSIS — G8918 Other acute postprocedural pain: Secondary | ICD-10-CM | POA: Diagnosis not present

## 2022-08-18 DIAGNOSIS — I5042 Chronic combined systolic (congestive) and diastolic (congestive) heart failure: Secondary | ICD-10-CM | POA: Diagnosis not present

## 2022-08-18 DIAGNOSIS — I509 Heart failure, unspecified: Secondary | ICD-10-CM | POA: Diagnosis not present

## 2022-08-18 DIAGNOSIS — Z952 Presence of prosthetic heart valve: Secondary | ICD-10-CM | POA: Diagnosis not present

## 2022-08-18 DIAGNOSIS — W19XXXD Unspecified fall, subsequent encounter: Secondary | ICD-10-CM | POA: Diagnosis not present

## 2022-08-18 DIAGNOSIS — R791 Abnormal coagulation profile: Secondary | ICD-10-CM | POA: Diagnosis not present

## 2022-08-18 DIAGNOSIS — Z9181 History of falling: Secondary | ICD-10-CM | POA: Diagnosis not present

## 2022-08-18 DIAGNOSIS — R262 Difficulty in walking, not elsewhere classified: Secondary | ICD-10-CM | POA: Diagnosis not present

## 2022-08-18 LAB — CBC
HCT: 31.4 % — ABNORMAL LOW (ref 36.0–46.0)
Hemoglobin: 10 g/dL — ABNORMAL LOW (ref 12.0–15.0)
MCH: 28.7 pg (ref 26.0–34.0)
MCHC: 31.8 g/dL (ref 30.0–36.0)
MCV: 90.2 fL (ref 80.0–100.0)
Platelets: 178 10*3/uL (ref 150–400)
RBC: 3.48 MIL/uL — ABNORMAL LOW (ref 3.87–5.11)
RDW: 16.1 % — ABNORMAL HIGH (ref 11.5–15.5)
WBC: 4.9 10*3/uL (ref 4.0–10.5)
nRBC: 0 % (ref 0.0–0.2)

## 2022-08-18 LAB — PROTIME-INR
INR: 2.6 — ABNORMAL HIGH (ref 0.8–1.2)
Prothrombin Time: 28 seconds — ABNORMAL HIGH (ref 11.4–15.2)

## 2022-08-18 MED ORDER — LIDOCAINE 5 % EX PTCH: 1.0000 | MEDICATED_PATCH | CUTANEOUS | 0 refills | Status: DC

## 2022-08-18 MED ORDER — OXYCODONE HCL 5 MG PO TABS: 5.0000 mg | ORAL_TABLET | ORAL | 0 refills | Status: DC | PRN

## 2022-08-18 MED ORDER — ALPRAZOLAM 2 MG PO TABS: 1.0000 mg | ORAL_TABLET | Freq: Two times a day (BID) | ORAL | 0 refills | Status: AC | PRN

## 2022-08-18 MED ORDER — ENSURE ENLIVE PO LIQD: 237.0000 mL | Freq: Two times a day (BID) | ORAL | 12 refills | Status: DC

## 2022-08-18 NOTE — Discharge Summary (Signed)
Physician Discharge Summary   Patient: [name] [name] MRN: [id number] DOB: 12-05-1945  Admit date:     08/11/2022  Discharge date: 08/18/22  Discharge Physician: Meredeth Ide   PCP: Marylen Ponto, MD   Recommendations at discharge:   Follow-up orthopedics Dr. Blanchie Dessert in 2 weeks   Discharge Diagnoses: Principal Problem:   Closed right hip fracture [facility]) Active Problems:   Fall at home, initial encounter   H/O mitral valve replacement with mechanical valve   Supratherapeutic INR   PAF (paroxysmal atrial fibrillation) (HCC)   Hypokalemia   Chronic combined systolic and diastolic CHF (congestive heart failure) (HCC)   Hypothyroidism   S/P TAVR (transcatheter aortic valve replacement)   Presence of permanent cardiac pacemaker   Anxiety  Resolved Problems:   * No resolved hospital problems. *  Hospital Course:  76 year old female with medical history of hypertension, hyperlipidemia, paroxysmal atrial fibrillation on anticoagulation with warfarin, severe leukocytosis s/p TAVR in 2022, CHF with last EF of 50 to 55%, s/p mechanical mitral valve, status post pacemaker, hypothyroidism, AAA, GERD presented after having fall at home.  In the ED x-ray of the hip revealed minimally displaced fracture of right femoral neck.  Orthopedics was consulted.  Patient found to have elevated INR, vitamin K 3 mg and 2 units FFP ordered.   Assessment and Plan:  Right femoral neck fracture; secondary to mechanical fall -Status post right THA for femoral neck fracture -Continue oxycodone as needed -Orthopedics to follow patient in 2 weeks     Chronic systolic heart failure/critical AS s/p TAVR/rheumatic heart disease s/p mechanical MVR -Cardiology was consulted for preop evaluation -Patient started on Lovenox 60 mg subcu every 12 hours along with Coumadin per pharmacy; INR is 2.6.  Goal INR is 3.0 -Bridging Lovenox therapy has been discontinued before discharge -Continue Coumadin 7.5 mg  every day     Hypothyroidism -Continue levothyroxine   Hyponatremia -Likely from HCTZ which was started yesterday -HCTZ on hold -Sodium improved to 134   Hypertension -Blood pressure is stable -Hold HCTZ; continue diltiazem    Severe aortic stenosis s/p TAVR -S/p surgical repair in 12/22   Hypokalemia -Replete          Consultants: Orthopedics, cardiology Procedures performed: Right THA Disposition: Skilled nursing facility Diet recommendation:  Discharge Diet Orders (From admission, onward)     Start     Ordered   08/18/22 0000  Diet - low sodium heart healthy        08/18/22 1044           Regular diet DISCHARGE MEDICATION: Allergies as of 08/18/2022       Reactions   Atenolol Other (See Comments)   HR and pulse dropped   Metoprolol Succinate [metoprolol] Other (See Comments)   HR and pulse dropped   Baclofen Anxiety, Other (See Comments)   Makes the patient feel anxious the day after taking this. "Makes me jerk"    Latex Rash        Medication List     STOP taking these medications    aspirin 325 MG tablet   hydrochlorothiazide 25 MG tablet Commonly known as: HYDRODIURIL   Klor-Con M20 20 MEQ tablet Generic drug: potassium chloride SA       TAKE these medications    albuterol 108 (90 Base) MCG/ACT inhaler Commonly known as: VENTOLIN HFA Inhale 2 puffs into the lungs as needed for shortness of breath.   alprazolam 2 MG tablet Commonly known as: XANAX Take  0.5 tablets (1 mg total) by mouth 2 (two) times daily as needed for anxiety. What changed:  how much to take Another medication with the same name was removed. Continue taking this medication, and follow the directions you see here.   atorvastatin 80 MG tablet Commonly known as: LIPITOR Take 80 mg by mouth at bedtime.   cyclobenzaprine 5 MG tablet Commonly known as: FLEXERIL Take 5 mg by mouth at bedtime as needed for muscle spasms.   diltiazem 240 MG 24 hr  capsule Commonly known as: DILACOR XR Take 240 mg by mouth daily as needed (tachycardia).   docusate sodium 100 MG capsule Commonly known as: COLACE Take 1 capsule (100 mg total) by mouth 2 (two) times daily.   feeding supplement Liqd Take 237 mLs by mouth 2 (two) times daily between meals.   furosemide 20 MG tablet Commonly known as: LASIX Take 30 mg by mouth daily.   levothyroxine 75 MCG tablet Commonly known as: SYNTHROID Take 75 mcg by mouth daily before breakfast.   lidocaine 5 % Commonly known as: LIDODERM Place 1 patch onto the skin daily. Remove & Discard patch within 12 hours or as directed by MD   Menthol (Topical Analgesic) 2 % Gel Apply 1 Application topically as needed (pain).   nitroGLYCERIN 0.4 MG SL tablet Commonly known as: NITROSTAT Place 1 tablet (0.4 mg total) under the tongue every 5 (five) minutes as needed for chest pain.   oxyCODONE 5 MG immediate release tablet Commonly known as: Oxy IR/ROXICODONE Take 1-2 tablets (5-10 mg total) by mouth every 4 (four) hours as needed for severe pain or moderate pain.   pantoprazole 40 MG tablet Commonly known as: PROTONIX Take 1 tablet (40 mg total) by mouth daily.   SALONPAS EX Apply 1-2 patches topically daily as needed (pain).   TYLENOL PO Take 1 tablet by mouth as needed (pain).   warfarin 5 MG tablet Commonly known as: COUMADIN Take 7.5 mg by mouth every evening. What changed: Another medication with the same name was removed. Continue taking this medication, and follow the directions you see here.   zolpidem 10 MG tablet Commonly known as: AMBIEN Take 10 mg by mouth at bedtime as needed for sleep.        Contact information for follow-up providers     Joen Laura, MD Follow up in 2 week(s).   Specialty: Orthopedic Surgery Contact information: [address] [place] [address] [phone]              Contact information for after-discharge care      Destination     [facility] .   Service: Skilled Nursing Contact information: [address] [place] [address] [phone]                    Discharge Exam: Ceasar Mons Weights   08/11/22 0142 08/14/22 0500 08/15/22 0500  Weight: 52.2 kg 58.8 kg 53.5 kg   General-appears in no acute distress Heart-S1-S2, regular, no murmur auscultated Lungs-clear to auscultation bilaterally, no wheezing or crackles auscultated Abdomen-soft, nontender, no organomegaly Extremities-no edema in the lower extremities Neuro-alert, oriented x3, no focal deficit noted  Condition at discharge: good  The results of significant diagnostics from this hospitalization (including imaging, microbiology, ancillary and laboratory) are listed below for reference.   Imaging Studies: DG Foot 2 Views Right  Result Date: 08/15/2022 CLINICAL DATA:  Right foot and ankle pain. EXAM: RIGHT FOOT -  2 VIEW COMPARISON:  None Available. FINDINGS: There is no evidence of fracture or dislocation. Slight hammertoe deformity of the digits. There is no evidence of arthropathy or other focal bone abnormality. No erosion or bony destruction. No periostitis. Prominent vascular calcifications. IMPRESSION: No acute findings.  Prominent vascular calcifications. Electronically Signed   By: Narda Rutherford M.D.   On: 08/15/2022 09:38   DG Ankle 2 Views Right  Result Date: 08/15/2022 CLINICAL DATA:  Foot and ankle pain. EXAM: RIGHT ANKLE - 2 VIEW COMPARISON:  None Available. FINDINGS: There is no evidence of fracture, dislocation, or joint effusion. The ankle mortise is preserved. There is no evidence of arthropathy or other focal bone abnormality. Prominent vascular calcifications. IMPRESSION: No acute findings. Prominent vascular calcifications. Electronically Signed   By: Narda Rutherford M.D.   On: 08/15/2022 09:35   DG HIP UNILAT W OR W/O PELVIS 2-3 VIEWS RIGHT  Result Date:  08/12/2022 CLINICAL DATA:  Status post total right hip arthroplasty. Postoperative. EXAM: DG HIP (WITH OR WITHOUT PELVIS) 2-3V RIGHT COMPARISON:  Intraoperative radiograph right hip 08/12/2022, pelvis and right hip radiographs 08/11/2022 FINDINGS: Interval total right hip arthroplasty. No perihardware lucency is seen to indicate hardware failure or loosening. Expected postoperative changes including right hip subcutaneous air. Mild pubic symphysis joint space narrowing and subchondral sclerosis. Mild superior left femoroacetabular joint space narrowing. No acute fracture or dislocation. IMPRESSION: Interval total right hip arthroplasty without evidence of hardware failure. Electronically Signed   By: Neita Garnet M.D.   On: 08/12/2022 18:46   DG HIP PORT UNILAT WITH PELVIS 1V RIGHT  Result Date: 08/12/2022 CLINICAL DATA:  Right total hip arthroplasty. EXAM: DG HIP (WITH OR WITHOUT PELVIS) 1V PORT RIGHT COMPARISON:  08/11/2022 FINDINGS: A single spot intraoperative image the right hip is provided for review and demonstrates the sequela of presumed ongoing right total hip replacement. Alignment appears anatomic given AP projection. No definite fracture. Surgical support apparatus overlies the right lower abdominal quadrant. Expected subcutaneous emphysema about the operative site. No definite radiopaque foreign body. IMPRESSION: Intraoperative radiograph demonstrates the sequela of ongoing right total hip replacement without evidence of complication. Electronically Signed   By: Simonne Come M.D.   On: 08/12/2022 16:29   DG Knee Complete 4 Views Right  Result Date: 08/11/2022 CLINICAL DATA:  Known right hip fracture, initial encounter EXAM: RIGHT KNEE - COMPLETE 4+ VIEW COMPARISON:  None Available. FINDINGS: No acute fracture or dislocation is noted. Multiple varicosities are noted along the lateral aspect of the thigh. IMPRESSION: No acute bony abnormality noted. Electronically Signed   By: Alcide Clever  M.D.   On: 08/11/2022 02:15   DG Chest 1 View  Result Date: 08/11/2022 CLINICAL DATA:  Recent fall EXAM: CHEST  1 VIEW COMPARISON:  10/16/2021 FINDINGS: Cardiac shadow is enlarged. Postsurgical changes are noted. Pacing device is again seen. Aortic calcifications are noted. The lungs are well aerated without focal infiltrate or effusion. No bony abnormality is noted. IMPRESSION: No active disease. Electronically Signed   By: Alcide Clever M.D.   On: 08/11/2022 02:14   DG Hip Unilat W or Wo Pelvis 2-3 Views Right  Result Date: 08/11/2022 CLINICAL DATA:  Fall EXAM: DG HIP (WITH OR WITHOUT PELVIS) 2-3V RIGHT COMPARISON:  None Available. FINDINGS: Minimally displaced fracture of the right femoral neck. No other pelvic fracture. The right femoral head remains located within the acetabulum. IMPRESSION: Minimally displaced fracture of the right femoral neck. Electronically Signed   By: Chrisandra Netters.D.  On: 08/11/2022 02:14    Microbiology: Results for orders placed or performed during the hospital encounter of 08/11/22  Surgical pcr screen     Status: None   Collection Time: 08/11/22  6:24 PM   Specimen: Nasal Mucosa; Nasal Swab  Result Value Ref Range Status   MRSA, PCR NEGATIVE NEGATIVE Final   Staphylococcus aureus NEGATIVE NEGATIVE Final    Comment: (NOTE) The Xpert SA Assay (FDA approved for NASAL specimens in patients 70 years of age and older), is one component of a comprehensive surveillance program. It is not intended to diagnose infection nor to guide or monitor treatment. Performed at [facility], [place] [address], [place], [place] [address]     Labs: CBC: Recent Labs  Lab 08/14/22 [phone] 08/15/22 0243 08/16/22 0320 08/17/22 0309 08/18/22 0340  WBC 8.3 6.6 5.3 5.0 4.9  HGB 10.0* 9.2* 9.3* 9.3* 10.0*  HCT 29.3* 27.6* 28.9* 28.7* 31.4*  MCV 84.9 86.3 87.8 88.6 90.2  PLT 103* 108* 130* 155 027   Basic Metabolic Panel: Recent Labs  Lab 08/12/22 0411  08/14/22 0439 08/15/22 0243 08/16/22 0320 08/17/22 0309  NA 135 129* 132* 134* 134*  K 3.3* 2.6* 3.2* 3.3* 3.9  CL 94* 88* 93* 95* 94*  CO2 29 31 29 31 30   GLUCOSE 119* 111* 101* 123* 106*  BUN 14 16 14 15 13   CREATININE 0.87 0.77 0.83 0.84 0.81  CALCIUM 8.8* 8.3* 8.3* 8.3* 8.5*   Liver Function Tests: No results for input(s): "AST", "ALT", "ALKPHOS", "BILITOT", "PROT", "ALBUMIN" in the last 168 hours. CBG: No results for input(s): "GLUCAP" in the last 168 hours.  Discharge time spent: greater than 30 minutes.  Signed: [name], MD [facility] 08/18/2022

## 2022-08-18 NOTE — Progress Notes (Signed)
Mobility Specialist Progress Note   08/18/22 0920  Mobility  Activity Ambulated with assistance in hallway;Dangled on edge of bed  Level of Assistance Contact guard assist, steadying assist  Assistive Device Front wheel walker  Distance Ambulated (ft) 65 ft  Range of Motion/Exercises Active;All extremities  RUE Weight Bearing WBAT  RLE Weight Bearing WBAT  Activity Response Tolerated well   Patient received dangling EOB, eager to participate. Progressing well with mobility as she did not require any physical assistance, improved tolerance to weight bear, and was able to progress distance. Stood and ambulated with slow steady gait. Returned to room without complaint or incident. Was left dangling EOB with all needs met, call bell in reach.   [place] Alyx Mcguirk, [place], [place]  Office: [phone]

## 2022-08-18 NOTE — Plan of Care (Addendum)
Called to give report at 1202. No answer, left message to call back with number. Problem: Education: Goal: Knowledge of General Education information will improve Description: Including pain rating scale, medication(s)/side effects and non-pharmacologic comfort measures Outcome: Adequate for Discharge   Problem: Health Behavior/Discharge Planning: Goal: Ability to manage health-related needs will improve Outcome: Adequate for Discharge   Problem: Clinical Measurements: Goal: Ability to maintain clinical measurements within normal limits will improve Outcome: Adequate for Discharge Goal: Will remain free from infection Outcome: Adequate for Discharge Goal: Diagnostic test results will improve Outcome: Adequate for Discharge Goal: Respiratory complications will improve Outcome: Adequate for Discharge Goal: Cardiovascular complication will be avoided Outcome: Adequate for Discharge   Problem: Activity: Goal: Risk for activity intolerance will decrease Outcome: Adequate for Discharge   Problem: Nutrition: Goal: Adequate nutrition will be maintained Outcome: Adequate for Discharge   Problem: Coping: Goal: Level of anxiety will decrease Outcome: Adequate for Discharge   Problem: Elimination: Goal: Will not experience complications related to bowel motility Outcome: Adequate for Discharge Goal: Will not experience complications related to urinary retention Outcome: Adequate for Discharge   Problem: Pain Managment: Goal: General experience of comfort will improve Outcome: Adequate for Discharge   Problem: Safety: Goal: Ability to remain free from injury will improve Outcome: Adequate for Discharge   Problem: Skin Integrity: Goal: Risk for impaired skin integrity will decrease Outcome: Adequate for Discharge

## 2022-08-18 NOTE — Plan of Care (Signed)

## 2022-08-18 NOTE — Progress Notes (Signed)
  RE:  [name] [name]    Date of Birth: Jan 05, 2046      Date:  08/18/22        To Whom It May Concern:  Please be advised that the above-named patient will require a short-term nursing home stay - anticipated 30 days or less for rehabilitation and strengthening.  The plan is for return home.                 MD signature                Date

## 2022-08-18 NOTE — TOC Progression Note (Addendum)
Transition of Care [facility]) - Progression Note    Patient Details  Name: KELSI BENHAM MRN: [id number] Date of Birth: April 13, 1946  Transition of Care [facility]) CM/SW Contact  Joanne Chars, LCSW Phone Number: 08/18/2022, 8:18 AM  Clinical Narrative:   Auth approved in [place]: [id number], [id number], 3 days: 10/30-11/1.  PASSR received: [id number]   Expected Discharge Plan: [place] Barriers to Discharge: Continued Medical Work up, SNF Pending bed offer  Expected Discharge Plan and Services Expected Discharge Plan: [place] In-house Referral: Clinical Social Work   Post Acute Care Choice: [place] arrangements for the past 2 months: [place] Determinants of Health (SDOH) Interventions    Readmission Risk Interventions    10/17/2021   12:17 PM  Readmission Risk Prevention Plan  Transportation Screening Complete  PCP or Specialist Appt within 3-5 Days Complete  Social Work Consult for [place] Planning/Counseling Complete  Palliative Care Screening Not Applicable  Medication Review Press photographer) Complete

## 2022-08-18 NOTE — TOC Transition Note (Signed)
Transition of Care [facility]) - CM/SW Discharge Note   Patient Details  Name: [name] [name] MRN: [id number] Date of Birth: 1946/09/11  Transition of Care [facility]) CM/SW Contact:  Joanne Chars, LCSW Phone Number: 08/18/2022, 11:01 AM   Clinical Narrative:   Pt discharging to [facility], room 601.  RN call report to [phone].      Final next level of care: Skilled Nursing Facility Barriers to Discharge: Barriers Resolved   Patient Goals and CMS Choice Patient states their goals for this hospitalization and ongoing recovery are:: get [place] back CMS Medicare.gov Compare Post Acute Care list provided to:: Patient Choice offered to / list presented to : Patient  Discharge Placement              Patient chooses bed at:  [facility]) Patient to be transferred to facility by: [facility]AR Name of family member notified: left message with daughter Lattie Haw, unable to leave message for daughter Maudie Mercury Patient and family notified of of transfer: 08/18/22  Discharge Plan and Services In-house Referral: Clinical Social Work   Post Acute Care Choice: [place]                               Social Determinants of Health ([place]) Interventions     Readmission Risk Interventions    10/17/2021   12:17 PM  Readmission Risk Prevention Plan  Transportation Screening Complete  PCP or Specialist Appt within 3-5 Days Complete  Social Work Consult for [place] Planning/Counseling Complete  Palliative Care Screening Not Applicable  Medication Review Press photographer) Complete

## 2022-08-19 ENCOUNTER — Encounter (HOSPITAL_COMMUNITY): Payer: Self-pay | Admitting: Orthopedic Surgery

## 2022-08-19 DIAGNOSIS — S72002D Fracture of unspecified part of neck of left femur, subsequent encounter for closed fracture with routine healing: Secondary | ICD-10-CM | POA: Diagnosis not present

## 2022-08-19 DIAGNOSIS — I509 Heart failure, unspecified: Secondary | ICD-10-CM | POA: Diagnosis not present

## 2022-08-19 DIAGNOSIS — R262 Difficulty in walking, not elsewhere classified: Secondary | ICD-10-CM | POA: Diagnosis not present

## 2022-08-19 DIAGNOSIS — G8918 Other acute postprocedural pain: Secondary | ICD-10-CM | POA: Diagnosis not present

## 2022-08-20 ENCOUNTER — Telehealth: Payer: Self-pay

## 2022-08-20 NOTE — Telephone Encounter (Signed)
A nurse from [facility] called. The patient broke her hip and is currently rehabbing there. 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 Offered to arrange virtual visit after echo, but the nurse said she didn't know if the patient would still be there or if she would want an in-person visit instead. It was decided to keep the echo and call the patient closer to her scheduled echo to confirm plan for follow-up appointment. The nurse was grateful for assistance.

## 2022-08-25 ENCOUNTER — Other Ambulatory Visit: Payer: Self-pay | Admitting: *Deleted

## 2022-08-25 NOTE — Patient Outreach (Signed)
Ms. Eland resides in [facility]. Screening for potential [facility] care coordination services as benefit of insurance plan and PCP.   Spoke with [facility] social worker to make aware Probation officer following for transition plans and potential [facility] needs.    Marthenia Rolling, MSN, RN,BSN [facility] Acute Care Coordinator [phone] (Direct dial)

## 2022-08-27 DIAGNOSIS — S72001D Fracture of unspecified part of neck of right femur, subsequent encounter for closed fracture with routine healing: Secondary | ICD-10-CM | POA: Diagnosis not present

## 2022-09-01 ENCOUNTER — Other Ambulatory Visit: Payer: Self-pay | Admitting: *Deleted

## 2022-09-01 NOTE — Patient Outreach (Signed)
[facility] Post-Acute Care Coordinator follow up. Verified in [facility] Ms. Brott discharged from [facility] on 08/30/22 with home health.   Telephone call made to Ms. Lamartina [phone] to discuss [facility] follow up. No answer. HIPAA compliant voicemail message left to request return call.    Raiford Noble, MSN, RN,BSN [facility] Post Acute Care Coordinator [phone] (Direct dial)

## 2022-09-01 NOTE — Progress Notes (Unsigned)
HEART AND VASCULAR CENTER   MULTIDISCIPLINARY HEART VALVE TEAM  Virtual Visit via Telephone Note   Because of [name] [name]'s co-morbid illnesses, she is at least at moderate risk for complications without adequate follow up.  This format is felt to be most appropriate for this patient at this time.  The patient did not have access to video technology/had technical difficulties with video requiring transitioning to audio format only (telephone).  All issues noted in this document were discussed and addressed.  No physical exam could be performed with this format.  Please refer to the patient's chart for her consent to telehealth for [facility] ( at the bottom of this visit).  Date:  09/02/2022   ID:  [name] [name], DOB 04-04-1946, MRN [id number]  PCP:  Marylen Ponto, MD  [facility] Cardiologist: Dr. Dulce Sellar / Dr. Excell Seltzer & Dr. Laneta Simmers (TAVR)  [facility] Electrophysiologist:  None   Referring MD: Marylen Ponto, MD   1 year s/p TAVR   History of Present Illness:    [name] [name] is a 76 y.o. 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   She had rheumatic fever at age 36 and went on to develop rheumatic heart disease. She ultimately underwent mechanical mitral valve replacement approximately 20 years ago at [facility]. She has tolerated long-term warfarin therapy well without any recent bleeding problems. She underwent permanent pacemaker placement (dual-chamber) in 2009 by Dr Graciela Husbands. The patient's recent echocardiogram from July 22, 2021 demonstrates severe global LV systolic dysfunction with LVEF 30 to 35%, severe calcification and restriction of the aortic valve leaflets, mild to moderate aortic  valve insufficiency, and critical aortic stenosis with a mean transvalvular gradient of 53 mmHg, peak systolic velocity of 4.5 m/s, and calculated aortic valve area less than 0.5 cm.  She actually underwent TAVR evaluation at [facility], but did not follow through with completing her work-up there.  She had CT angiography studies done earlier this year.  She appeared to have access for transfemoral TAVR. She was then evaluated at [facility] but did not want to be treated at an academic center and requested referral to [facility] Structural Heart Team. She was seen by Dr. Excell Seltzer in the office on 08/22/21 and complained of shortness of breath and fatigue with low-level activity. She described progressive shortness of breath and generalized fatigue over the past year, now with limitation during low-level activities.  She can only walk short distances without feeling breathless.       She was then admitted 11/8-11/12/22 for influenza A and CHF. She was treated with IV lasix and Tamiflu. She had agitation and was noncompliant during this admission refusing care and being aggressive with staff. Pt refused further lasix stating she was dehydrated and home HCTZ was discontinued at discharge.      We attempted to bring her back into the office for re-evaluation with structural heart, but patient no showed and then ultimately refused an office visit saying she wanted to save money and wanted to just proceed with CT scans and appt with Dr. Laneta Simmers. These were set up for 10/01/21 but, unfortunately, the patient was too volume overloaded to lay for scanning and she was directly admitted  to the hospital for IV diuresis. Once able to lye flat CT scans were completed 10/02/21. L/RHC on 10/03/21 showed patent coronary arteries with diffuse nonobstructive CAD, continued patency of the mid LAD stent. She was ultimately discharged home on Lovenox and brought back 12/24 for direct admission for heparin prior to TAVR. When admitted she was  found to be volume overloaded and required IV diuresis. She underwent successful TAVR with a 23 mm Edwards Sapien 3 Ultra Resilia via the TF approach on 10/14/21. Post operative echo showed EF 25-30%, normally functioning TAVR with a mean gradient of 10.6 mmHg and no PVL as well as a normal appearing mechanical MVR, mod-severe TR and moderate dilatation of the ascending aorta, measuring 42 mm. She also had transaminitis, hypokalemia and hyponatremia. This was felt to be related to acute CHF and diuresis.    She was seen for post hospital follow up on 10/30/21 and started on Entresto given cardiomyopathy, HTN and mild volume overload, but she did not tolerate Entresto. 1 month echo showed EF 50-55%, normally functioning TAVR with a mean gradient of 9.6 mm hg and no PVL as well as severe TR.    Admitted 10/24-10/31/23 for hip fracture and underwent THA for femoral neck fracture. Then discharged to [facility] for rehab and discharged 11/12.  Today the patient presents via virtual medicine for follow up. She is recovering from her hip surgery. Daughter living with her. She is mostly limited by back pain and issues with hip. No CP or SOB. No LE edema, orthopnea or PND. No dizziness or syncope. No blood in stool or urine. No palpitations.    Past Medical History:  Diagnosis Date   Anxiety    Aortic aneurysm [facility])    Atrial arrhythmia    With prior history of atrial flutter ablation   Bradycardia    s/p pacemaker implantation   CONGESTIVE HEART FAILURE 07/31/2010   Qualifier: Diagnosis of  By: Graciela Husbands, MD, Susie Cassette    GERD (gastroesophageal reflux disease)    Hypertension    Hypothyroidism    Long Q-T syndrome    Myocardial infarct (HCC) 11/20/2011   Stent placement for 100% blockage of LAD   PACEMAKER, PERMANENT 2009   Medtronic Adapta serial # [id number]   Presence of permanent cardiac pacemaker    Rheumatic heart disease    With mitral valve replacement, tricuspid regurgitation  moderate to severe, significant left atrial enlargment   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 Excell Seltzer and Dr. Laneta Simmers   TRICUSPID REGURGITATION 12/23/2010   Qualifier: Diagnosis of  By: Graciela Husbands, MD, Susie Cassette    Unspecified essential hypertension 07/31/2010   Qualifier: Diagnosis of  By: Graciela Husbands, MD, [facility], Ty Hilts     Past Surgical History:  Procedure Laterality Date   APPENDECTOMY     CARDIAC ELECTROPHYSIOLOGY STUDY AND ABLATION     Stent and pacemaker placement   CHOLECYSTECTOMY     CORONARY ANGIOPLASTY WITH STENT PLACEMENT Left 09/13/2015   Dr. Josefa Half at [facility], LAD stent   INTRAOPERATIVE TRANSTHORACIC ECHOCARDIOGRAM N/A 10/14/2021   Procedure: INTRAOPERATIVE TRANSTHORACIC ECHOCARDIOGRAM;  Surgeon: Tonny Bollman, MD;  Location: [facility] OR;  Service: Open Heart Surgery;  Laterality: N/A;   MITRAL VALVE REPLACEMENT  2001   mechanical valve   OOPHORECTOMY     PACEMAKER INSERTION  11/20/2007   Dual chamber pacemaker implantation with rapid ventricular pacing   RIGHT/LEFT HEART CATH AND CORONARY ANGIOGRAPHY  N/A 10/03/2021   Procedure: RIGHT/LEFT HEART CATH AND CORONARY ANGIOGRAPHY;  Surgeon: Tonny Bollman, MD;  Location: [facility] INVASIVE CV LAB;  Service: Cardiovascular;  Laterality: N/A;   SALPINGECTOMY Bilateral    TONSILLECTOMY     TOTAL HIP ARTHROPLASTY Right 08/12/2022   Procedure: RIGHT TOTAL HIP ARTHROPLASTY;  Surgeon: Joen Laura, MD;  Location: [facility] OR;  Service: Orthopedics;  Laterality: Right;   TRANSCATHETER AORTIC VALVE REPLACEMENT, TRANSFEMORAL N/A 10/14/2021   Procedure: TRANSCATHETER AORTIC VALVE REPLACEMENT, TRANSFEMORAL;  Surgeon: Tonny Bollman, MD;  Location: [facility] OR;  Service: Open Heart Surgery;  Laterality: N/A;    Current Medications: No outpatient medications have been marked as taking for the 09/02/22 encounter (Office Visit) with [facility].      Allergies:   Atenolol, Metoprolol succinate [metoprolol], Baclofen, and Latex   Social History   Socioeconomic History   Marital status: Single    Spouse name: Not on file   Number of children: Not on file   Years of education: Not on file   Highest education level: Not on file  Occupational History   Not on file  Tobacco Use   Smoking status: Never   Smokeless tobacco: Never  Substance and Sexual Activity   Alcohol use: Never   Drug use: Never   Sexual activity: Not on file  Other Topics Concern   Not on file  Social History Narrative   Not on file   Social Determinants of Health   Financial Resource Strain: Not on file  Food Insecurity: Not on file  Transportation Needs: Not on file  Physical Activity: Not on file  Stress: Not on file  Social Connections: Not on file     Family History: The patient's family history includes Heart attack in her paternal grandmother; Heart disease in her father; Leukemia in her mother; Stroke in her maternal grandmother and mother.  ROS:   Please see the history of present illness.    All other systems reviewed and are negative.  EKGs/Labs/Other Studies Reviewed:    The following studies were reviewed today:  TAVR OPERATIVE NOTE     Date of Procedure:                10/14/2021   Preoperative Diagnosis:      Severe Aortic Stenosis    Postoperative Diagnosis:    Same    Procedure:        Transcatheter Aortic Valve Replacement - Percutaneous Right Transfemoral Approach             Edwards Sapien 3 Ultra Resilia THV (size 23 mm, model # 9755RSL, serial # [id number])              Co-Surgeons:                        Alleen Borne, MD and Tonny Bollman, MD     Anesthesiologist:                  Achille Rich, MD   Echocardiographer:              Charlton Haws, MD   Pre-operative Echo Findings: Severe aortic stenosis Moderate left ventricular systolic dysfunction   Post-operative Echo Findings: Trace paravalvular  leak Unchanged moderate left ventricular systolic dysfunction   ______________________   Echo 10/15/21 IMPRESSIONS   1. Inferior septal and apical hypokinesis . Left ventricular ejection  fraction, by estimation, is 25 to 30%. The left ventricle  has severely  decreased function. The left ventricle demonstrates regional wall motion  abnormalities (see scoring  diagram/findings for description). The left ventricular internal cavity  size was mildly dilated. There is severe asymmetric left ventricular  hypertrophy of the basal and septal segments. Left ventricular diastolic  parameters are indeterminate.   2. Pacing wires in RA/RV . Right ventricular systolic function is normal.  The right ventricular size is normal. There is moderately elevated  pulmonary artery systolic pressure.   3. Left atrial size was severely dilated.   4. Right atrial size was mildly dilated.   5. Normal appearing mechanical 27 mm St Jude AVR no PVL. The mitral valve  has been repaired/replaced. No evidence of mitral valve regurgitation.  There is a 27 mm mechanical valve present in the mitral position.   6. Tricuspid valve regurgitation is moderate to severe.   7. 12.27.22 post TAVR with 23 mm Sapien 3 valve previous PVL gone mean  gradient 10.6 mm peak 20.9 mm AVA 2.6 cm2 DVI 0.71 . The aortic valve has  been repaired/replaced. Aortic valve regurgitation is not visualized.  There is a 23 mm Sapien prosthetic  (TAVR) valve present in the aortic position.   8. Aortic dilatation noted. There is moderate dilatation of the ascending  aorta, measuring 42 mm.   _________________________  Echo 12/10/21 IMPRESSIONS  1. Left ventricular ejection fraction, by estimation, is 50 to 55%. The left ventricle has low normal function. The left ventricle demonstrates regional wall motion abnormalities (see scoring diagram/findings for description). There is severe asymmetric  left ventricular hypertrophy of the basal-septal  segment. Left ventricular diastolic function could not be evaluated. Elevated left ventricular end-diastolic pressure. There is dyskinesis of the left ventricular, mid-apical septal wall.  2. Right ventricular systolic function is normal. The right ventricular size is moderately enlarged. There is moderately elevated pulmonary artery systolic pressure. The estimated right ventricular systolic pressure is 56.2 mmHg.  3. Left atrial size was severely dilated.  4. Right atrial size was moderately dilated.  5. The mitral valve has been repaired/replaced. No evidence of mitral valve regurgitation. There is a 27 mm St. Jude mechanical valve present in the mitral position.  6. Tricuspid valve regurgitation is severe.  7. The aortic valve has been repaired/replaced. Aortic valve regurgitation is not visualized. There is a 23 mm Sapien prosthetic (TAVR) valve present in the aortic position. Procedure Date: 10/14/2021. Aortic valve mean gradient measures 9.6 mmHg.  8. Aortic dilatation noted. There is mild dilatation of the ascending aorta, measuring 40 mm.  9. The inferior vena cava is dilated in size with <50% respiratory variability, suggesting right atrial pressure of 15 mmHg.   Comparison(s): Prior images reviewed side by side. Changes from prior study are noted.  EKG:  EKG is NOT ordered today.  Recent Labs: 10/11/2021: B Natriuretic Peptide 2,466.3 08/11/2022: ALT 18; Magnesium 1.9; TSH 10.380 08/17/2022: BUN 13; Creatinine, Ser 0.81; Potassium 3.9; Sodium 134 08/18/2022: Hemoglobin 10.0; Platelets 178  Recent Lipid Panel    Component Value Date/Time   CHOL  02/02/2008 0435    175        ATP III CLASSIFICATION:  <200     mg/dL   Desirable  161-096  mg/dL   Borderline High  >=045    mg/dL   High   TRIG 409 81/19/1478 0435   HDL 33 (L) 02/02/2008 0435   CHOLHDL 5.3 02/02/2008 0435   VLDL 25 02/02/2008 0435   LDLCALC (H) 02/02/2008 0435  117        Total Cholesterol/HDL:CHD  Risk Coronary Heart Disease Risk Table                     Men   Women  1/2 Average Risk   3.4   3.3     Risk Assessment/Calculations:    CHA2DS2-VASc Score =     This indicates a  % annual risk of stroke. The patient's score is based upon:        Physical Exam:    VS:  There were no vitals taken for this visit.    Wt Readings from Last 3 Encounters:  08/15/22 117 lb 15.1 oz (53.5 kg)  05/18/22 113 lb (51.3 kg)  12/10/21 103 lb (46.7 kg)    Unable to assess over phone.   ASSESSMENT:    1. S/P TAVR (transcatheter aortic valve replacement)   2. Chronic combined systolic and diastolic heart failure (HCC)   3. H/O mitral valve replacement with mechanical valve   4. PAF (paroxysmal atrial fibrillation) (HCC)   5. Thoracic aortic aneurysm without rupture, unspecified part (HCC)   6. Essential hypertension     PLAN:    In order of problems listed above:   Severe AS s/p TAVR:  she unfortunately could not make her in person apt due to a recent fall and hip replacement with SNF placement. She is now discharged. She will not be able to go to an in person echo until Jan. I r/s her echo to 1/18 @ 2pm. [facility] filled out. She has NYHA class I symptoms. Continue on Coumadin. SBE prophylaxis discussed; the patient is edentulous and does not go to the dentist. She will continue regular follow up with Dr. Dulce Sellar     Chronic combined systolic and diastolic CHF: does not sound like she has had any issues with CHF. Will reassess EF on echo in Jan.   S/p mechanical MVR: continue on Coumadin     Paroxysmal atrial fibrillation: Continue on Coumadin and PRN dilt. She had been resistant to taking a BB in the past .    Dilated ascending aorta: measuring 4.4 cm. Will continue with routine surveillance. Follow on echo in Jan   HTN: she says her BP is well controlled. No changes made.   Medication Adjustments/Labs and Tests Ordered: Current medicines are reviewed at length with the patient  today.  Concerns regarding medicines are outlined above.  No orders of the defined types were placed in this encounter.  No orders of the defined types were placed in this encounter.   There are no Patient Instructions on file for this visit.   Signed, Cline Crock, PA-C  09/02/2022 11:28 AM    [facility]   Your [facility] Heart[facility] team (Cardiologist [Heart Doctor] and Advanced Practice Provider [Physician Assistant; Nurse Practitioner]) has arranged for your next office appointment to be a virtual visit (also known as "Telehealth", "Telemedicine", "E-Visit").   We now offer virtual visits for all our patients.  This helps [place] to expand our ability to see patients in a timely and safe manner.  These visits are billed to your insurance just like traditional, in person, appointments.  Please review this IMPORTANT information about your upcoming appointment.   **PLEASE READ THE SECTION BELOW LABELED "CONSENT".**   **CALL OUR OFFICE WITH QUESTIONS.**   WHAT YOU NEED FOR YOUR VIRTUAL VISIT: You will need a SmartPhone with microphone and video capability.  [If you are using  MyChart to connect to your visit, it is also possible to use a desktop/laptop computer (with an Internet connection), as long as you have microphone and video capability.] You will need to use [employer], [employer] or [employer] as Medical sales representative. We highly recommend that you have a MyChart account as this will make connecting to your visit seamless.  A MyChart account not only allows you to connect to your provider for a virtual visit, but also allows you to see the results of all your tests, provider notes, medications and upcoming appointments.   A MyChart account is not absolutely necessary.  We can still complete your visit if you do not have one.   If you do not have a computer or SmartPhone with video/microphone capability or your Internet/cell service is weak on the day of your visit, we will do  your visit by telephone.   A blood pressure cuff and scale are essential to collect your vital signs at home.  If you do not have these and you are unable to obtain them, please contact our office so that we can make arrangements for you. If you have a pulse oximeter, Apple watch, Kardia mobile device, etc, you can collect data from these devices as well to share with the provider for your visit.  These devices are not required for a virtual visit.    WHAT TO DO ON THE DAY OF YOUR APPOINTMENT: 30 minutes before your appointment: Take your blood pressure, pulse or heart rate (if your blood pressure machine is able to collect it) and weight.  Write all these numbers down so you can give it to the nurse or medical assistant that calls. Get all of the medications you currently take and put them where you will be sitting for the appointment.  The nurse/medical assistant will go over these with you when he/she calls.  15 minutes before your appointment: You will receive a phone call from a nurse or medical assistant from our office.  The caller ID on your phone may indicate that the caller is either "[facility]" or "[place]".  However, the number may come across as spam.  Please turn off any spam blocker so you do not miss the call. The nurse will: Ask for your blood pressure, pulse, weight, height. Go over all of your medications to make sure your chart is correct. Review your allergies, smoking history, reason for appointment, etc.  Give you instructions on how to connect with the video platform or telephone.  IF YOUR VISIT IS BY TELEPHONE ONLY: After the nurse finishes getting you ready for the visit, your provider will call you on the phone number you provide to [place].   TO CONNECT WITH YOUR PROVIDER FOR YOUR APPOINTMENT (BY VIDEO): You will either connect with the provider with your MyChart account or (if you are not using MyChart) with a link sent to your SmartPhone by text message.  If  you are using MyChart, see below.  If you are not using MyChart, the nurse will send you the text message during the phone call.     If you are connecting with your MyChart account:   (The nurse that calls you will tell you when to do this):  You will log into your account. At the top of your home screen, you should see the following prompt that tells you to "Begin your video visit with . . . ".  Click the green button (BEGIN VISIT).    The  next screen will say "It's time to start your video visit!" Click the green button (BEGIN VIDEO VISIT)    There may be a screen that appears that asks for permission to use your camera and/or microphone.  Click ALLOW.  This will open the browser where your appointment will take place.   You may see the message: "Welcome.  Waiting for the call to begin."  Or, you will see that the nurse or your provider are already "in" the room waiting on you.   If you are connecting with a link sent to your SmartPhone via text: The nurse that calls you will send the link by text message. Click on the link (it should look something like this):     If asked to give permission to use the camera and or microphone, click ALLOW This will open the browser where your appointment will take place.      You may see the message: "Welcome.  Waiting for the call to begin."  Or, you will see that the nurse or your provider are already "in" the room waiting on you.    The controls for your visit look like the picture below.  Please note that this is what the microphone and camera look like when they are ON.  If muted, they will have a line through them.    After the appointment: Once your provider leaves the appointment, he/she will go over instructions with the nurse/medical assistant.  If needed, this person will call you with any instructions, appointments, etc.   A copy of your After Visit Summary (AVS) will be available later that day in your MyChart account.  This  document will have all of your instructions, medications, appointments, etc.  If you are not using MyChart, we will mail it to your home.     *CONSENT FOR TELE-HEALTH VISIT - PLEASE REVIEW* By participating in the scheduled virtual visit (and any virtual visit scheduled within 365 days of the printing of this document), I agree to the following:   I hereby voluntarily request, consent and authorize [facility] and its employed or contracted physicians, physician assistants, nurse practitioners or other licensed health care professionals (the Practitioner), to provide me with telemedicine health care services (the "Services") as deemed necessary by the treating Practitioner. I acknowledge and consent to receive the Services by the Practitioner via telemedicine. I understand that the telemedicine visit will involve communicating with the Practitioner through live audiovisual communication technology and the disclosure of certain medical information by electronic transmission. I acknowledge that I have been given the opportunity to request an in-person assessment or other available alternative prior to the telemedicine visit and am voluntarily participating in the telemedicine visit.  I understand that I have the right to withhold or withdraw my consent to the use of telemedicine in the course of my care at any time, without affecting my right to future care or treatment, and that the Practitioner or I may terminate the telemedicine visit at any time. I understand that I have the right to inspect all information obtained and/or recorded in the course of the telemedicine visit and may receive copies of available information for a reasonable fee.  I understand that some of the potential risks of receiving the Services via telemedicine include:  Delay or interruption in medical evaluation due to technological equipment failure or disruption; Information transmitted may not be sufficient (e.g. poor resolution  of images) to allow for appropriate medical decision making by the Practitioner;  and/or  In rare instances, security protocols could fail, causing a breach of personal health information.  Furthermore, I acknowledge that it is my responsibility to provide information about my medical history, conditions and care that is complete and accurate to the best of my ability. I acknowledge that Practitioner's advice, recommendations, and/or decision may be based on factors not within their control, such as incomplete or inaccurate data provided by me or distortions of diagnostic images or specimens that may result from electronic transmissions. I understand that the practice of medicine is not an exact science and that Practitioner makes no warranties or guarantees regarding treatment outcomes. I acknowledge that a copy of this consent can be made available to me via my patient portal [facility] MyChart), or I can request a printed copy by calling the office of [facility].    I understand that my insurance will be billed for this visit.   I have read or had this consent read to me. I understand the contents of this consent, which adequately explains the benefits and risks of the Services being provided via telemedicine.  I have been provided ample opportunity to ask questions regarding this consent and the Services and have had my questions answered to my satisfaction. I give my informed consent for the services to be provided through the use of telemedicine in my medical care

## 2022-09-02 ENCOUNTER — Ambulatory Visit: Payer: Medicare Other

## 2022-09-02 ENCOUNTER — Ambulatory Visit: Payer: Medicare Other | Attending: Internal Medicine | Admitting: Physician Assistant

## 2022-09-02 ENCOUNTER — Other Ambulatory Visit (HOSPITAL_COMMUNITY): Payer: Medicare Other

## 2022-09-02 DIAGNOSIS — Z952 Presence of prosthetic heart valve: Secondary | ICD-10-CM | POA: Diagnosis not present

## 2022-09-02 DIAGNOSIS — I10 Essential (primary) hypertension: Secondary | ICD-10-CM

## 2022-09-02 DIAGNOSIS — I712 Thoracic aortic aneurysm, without rupture, unspecified: Secondary | ICD-10-CM

## 2022-09-02 DIAGNOSIS — I5042 Chronic combined systolic (congestive) and diastolic (congestive) heart failure: Secondary | ICD-10-CM

## 2022-09-02 DIAGNOSIS — I480 Paroxysmal atrial fibrillation: Secondary | ICD-10-CM | POA: Diagnosis not present

## 2022-09-06 DIAGNOSIS — I5020 Unspecified systolic (congestive) heart failure: Secondary | ICD-10-CM | POA: Diagnosis not present

## 2022-09-07 ENCOUNTER — Other Ambulatory Visit: Payer: Medicare Other

## 2022-09-09 DIAGNOSIS — Z952 Presence of prosthetic heart valve: Secondary | ICD-10-CM | POA: Diagnosis not present

## 2022-09-09 DIAGNOSIS — Z7901 Long term (current) use of anticoagulants: Secondary | ICD-10-CM | POA: Diagnosis not present

## 2022-09-11 ENCOUNTER — Other Ambulatory Visit: Payer: Self-pay | Admitting: Physician Assistant

## 2022-10-01 DIAGNOSIS — S72001D Fracture of unspecified part of neck of right femur, subsequent encounter for closed fracture with routine healing: Secondary | ICD-10-CM | POA: Diagnosis not present

## 2022-10-27 DIAGNOSIS — Z952 Presence of prosthetic heart valve: Secondary | ICD-10-CM | POA: Diagnosis not present

## 2022-10-27 DIAGNOSIS — Z7901 Long term (current) use of anticoagulants: Secondary | ICD-10-CM | POA: Diagnosis not present

## 2022-11-05 ENCOUNTER — Ambulatory Visit: Payer: Medicare Other | Attending: Physician Assistant

## 2022-11-06 ENCOUNTER — Telehealth: Payer: Self-pay

## 2022-11-06 DIAGNOSIS — I5020 Unspecified systolic (congestive) heart failure: Secondary | ICD-10-CM | POA: Diagnosis not present

## 2022-11-06 NOTE — Telephone Encounter (Signed)
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 Upon review of the pt's chart, she did not show for this appointment.  I left a message at the pt's home number to call the office to have echo appointment rescheduled.

## 2022-11-09 ENCOUNTER — Encounter (HOSPITAL_COMMUNITY): Payer: Self-pay | Admitting: Physician Assistant

## 2022-11-09 NOTE — Telephone Encounter (Signed)
Echocardiogram rescheduled to 11/23/2022 in the [place] office.  Pt aware of appointment.

## 2022-11-17 DIAGNOSIS — S72001D Fracture of unspecified part of neck of right femur, subsequent encounter for closed fracture with routine healing: Secondary | ICD-10-CM | POA: Diagnosis not present

## 2022-11-23 ENCOUNTER — Ambulatory Visit: Payer: Medicare Other | Attending: Physician Assistant

## 2022-11-23 DIAGNOSIS — M62838 Other muscle spasm: Secondary | ICD-10-CM | POA: Diagnosis not present

## 2022-11-23 DIAGNOSIS — Z952 Presence of prosthetic heart valve: Secondary | ICD-10-CM | POA: Diagnosis not present

## 2022-11-23 DIAGNOSIS — Z7901 Long term (current) use of anticoagulants: Secondary | ICD-10-CM | POA: Diagnosis not present

## 2022-11-23 DIAGNOSIS — Z79899 Other long term (current) drug therapy: Secondary | ICD-10-CM | POA: Diagnosis not present

## 2022-11-23 DIAGNOSIS — I10 Essential (primary) hypertension: Secondary | ICD-10-CM | POA: Diagnosis not present

## 2022-11-23 DIAGNOSIS — E039 Hypothyroidism, unspecified: Secondary | ICD-10-CM | POA: Diagnosis not present

## 2022-11-23 DIAGNOSIS — Z0000 Encounter for general adult medical examination without abnormal findings: Secondary | ICD-10-CM | POA: Diagnosis not present

## 2022-11-23 LAB — ECHOCARDIOGRAM COMPLETE
AR max vel: 1.7 cm2
AV Area VTI: 1.81 cm2
AV Area mean vel: 1.81 cm2
AV Mean grad: 8 mmHg
AV Peak grad: 14 mmHg
Ao pk vel: 1.87 m/s
Area-P 1/2: 2.58 cm2
Calc EF: 46.5 %
MV VTI: 2.52 cm2
S' Lateral: 3.5 cm
Single Plane A2C EF: 44.4 %
Single Plane A4C EF: 48.1 %

## 2022-11-24 ENCOUNTER — Other Ambulatory Visit: Payer: Self-pay | Admitting: Physician Assistant

## 2022-12-07 DIAGNOSIS — I5020 Unspecified systolic (congestive) heart failure: Secondary | ICD-10-CM | POA: Diagnosis not present

## 2022-12-14 ENCOUNTER — Telehealth: Payer: Self-pay | Admitting: Cardiology

## 2022-12-14 NOTE — Telephone Encounter (Signed)
Patient calling to request her echo results be mailed to her.

## 2022-12-15 NOTE — Telephone Encounter (Signed)
Left message to make patient aware that I will place a copy of her echo report in the mail per her request.

## 2022-12-18 DIAGNOSIS — Z7901 Long term (current) use of anticoagulants: Secondary | ICD-10-CM | POA: Diagnosis not present

## 2022-12-18 DIAGNOSIS — Z952 Presence of prosthetic heart valve: Secondary | ICD-10-CM | POA: Diagnosis not present

## 2023-01-05 DIAGNOSIS — I5020 Unspecified systolic (congestive) heart failure: Secondary | ICD-10-CM | POA: Diagnosis not present

## 2023-02-05 DIAGNOSIS — I5020 Unspecified systolic (congestive) heart failure: Secondary | ICD-10-CM | POA: Diagnosis not present

## 2023-02-08 DIAGNOSIS — Z7901 Long term (current) use of anticoagulants: Secondary | ICD-10-CM | POA: Diagnosis not present

## 2023-02-08 DIAGNOSIS — Z952 Presence of prosthetic heart valve: Secondary | ICD-10-CM | POA: Diagnosis not present

## 2023-03-01 ENCOUNTER — Other Ambulatory Visit: Payer: Self-pay | Admitting: Physician Assistant

## 2023-03-07 DIAGNOSIS — I5020 Unspecified systolic (congestive) heart failure: Secondary | ICD-10-CM | POA: Diagnosis not present

## 2023-03-08 ENCOUNTER — Other Ambulatory Visit: Payer: Self-pay | Admitting: Cardiology

## 2023-03-09 NOTE — Telephone Encounter (Signed)
Rx sent to pharmacy   

## 2023-03-10 ENCOUNTER — Other Ambulatory Visit: Payer: Self-pay

## 2023-03-10 DIAGNOSIS — R1310 Dysphagia, unspecified: Secondary | ICD-10-CM

## 2023-03-10 DIAGNOSIS — I719 Aortic aneurysm of unspecified site, without rupture: Secondary | ICD-10-CM | POA: Insufficient documentation

## 2023-03-10 HISTORY — DX: Dysphagia, unspecified: R13.10

## 2023-03-16 NOTE — Progress Notes (Unsigned)
Cardiology Office Note:    Date:  03/17/2023   ID:  [name] [name], DOB [birth year], MRN [id number]  PCP:  Marylen Ponto, MD  Cardiologist:  Norman Herrlich, MD    Referring MD: Marylen Ponto, MD    ASSESSMENT:    1. S/P TAVR (transcatheter aortic valve replacement)   2. H/O mitral valve replacement with mechanical valve   3. PAF (paroxysmal atrial fibrillation) (HCC)   4. Chronic anticoagulation   5. Presence of permanent cardiac pacemaker   6. Essential hypertension    PLAN:    In order of problems listed above:  Continues to do well cardiology perspective she continues to do well asymptomatic with her valvular heart disease anticoagulated with warfarin with mechanical MV ER and blood pressure at target she will continue her current loop diuretic and reduce her statin to 40 mg twice weekly with her small body stature.  Follow-up echocardiogram is ordered next February. Pacemaker care is to [facility]   Next appointment: 1 year   Medication Adjustments/Labs and Tests Ordered: Current medicines are reviewed at length with the patient today.  Concerns regarding medicines are outlined above.  No orders of the defined types were placed in this encounter.  No orders of the defined types were placed in this encounter.      History of Present Illness:    [name] [name] is a 77 y.o. female with a hx of complex cardiac disease including rheumatic fever or rheumatic heart disease and mechanical MVR more than 20 years ago at [facility] paroxysmal atrial fibrillation permanent pacemaker thoracic aortic aneurysm CAD with prior stenting and MI thrombocytopenia and critical aortic stenosis with TAVR 12/10/2021 and COPD last seen 05/18/2022.  Up post-TAVR echocardiogram 11/23/2022 showed normal left ventricular ejection fraction 55 to 60% normal right ventricular size and function mildly elevated pulmonary artery pressure severe left atrial enlargement normal  prosthetic mitral valve function and normal function of the TAVR valve.  Aneurysm was present in the ascending aorta 41 mm Compliance with diet, lifestyle and medications: Yes  In the interim she had a misstep fall fracture right hip surgery went to rehab and is recovered well full activities and living at home She had no cardiac complications Inquires about taking a direct anticoagulant I told her it is contraindicated with mechanical mitral valve She is not having edema shortness of breath chest pain palpitation or syncope is not having bleeding from her anticoagulant I was concerned about the dose of her high intensity statin which she tells me she only takes it once a week Ago she had to take atorvastatin 40 mg twice a week and I think will be effective in managing her lipid disorder.  Her last lipid profile showed a cholesterol 123 LDL 125 triglycerides 7502 02/2023 hemoglobin 11.8 creatinine 0.9 potassium 3.9 Past Medical History:  Diagnosis Date   Acute on chronic HFrEF (heart failure with reduced ejection fraction) (HCC) 08/26/2021   AKI (acute kidney injury) (HCC) 09/30/2021   Anxiety    Aortic aneurysm (HCC)    Atrial arrhythmia    With prior history of atrial flutter ablation   Bradycardia    s/p pacemaker implantation   Chronic anticoagulation 08/26/2021   Chronic combined systolic and diastolic CHF (congestive heart failure) (HCC) 09/30/2021   CKD (chronic kidney disease) stage 3, GFR 30-59 ml/min (HCC) 08/26/2021   Closed right hip fracture (HCC) 08/11/2022   CONGESTIVE HEART FAILURE 07/31/2010   Qualifier: Diagnosis of  By:  Graciela Husbands, MD, [facility] Ty Hilts    Coronary atherosclerosis 06/08/2014   Last Assessment & Plan: Formatting of this note might be different from the original. Complains of consistent and persistent shortness of breath.  She has been experiencing some bilateral arm pain.  She is certain she did not experience that discomfort but that was not her anginal  equivalent when she had a myocardial infarction several years back.  With that said, symptoms do improve with the subl   DNR (do not resuscitate) 09/30/2021   Dysphagia 03/10/2023   Essential hypertension 07/31/2010   Qualifier: Diagnosis of   By: Graciela Husbands, MD, Susie Cassette      Fall at home, initial encounter 08/11/2022   GERD (gastroesophageal reflux disease)    H/O mitral valve replacement with mechanical valve 10/14/2021   Hypokalemia 10/17/2021   Hypothyroidism    Hypoxemia 08/26/2021   Influenza A 08/26/2021   Left ventricular dysfunction 06/08/2014   Long Q-T syndrome    Malnutrition of moderate degree 08/28/2021   Mixed hyperlipidemia 12/12/2019   Myocardial infarct (HCC) 11/20/2011   Stent placement for 100% blockage of LAD   Pacemaker reprogramming/check 10/19/2008   Last Assessment & Plan: Formatting of this note might be different from the original. Normal Device Function ----- NEARING Elective Replacement ---- Presents in sinus rhythm. No Permanent Device Reprogramming Required -  Follow up 3-4 months, remote monitoring on a quarterly basis, as per protocol.  Encouraged follow up with PCP and Cardiologist.   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 Excell Seltzer and Dr. Laneta Simmers   Senile nuclear sclerosis 10/17/2015   Severe aortic stenosis 08/26/2021   SHORTNESS OF BREATH 07/31/2010   Qualifier: Diagnosis of   By: Graciela Husbands, MD, Susie Cassette      Supratherapeutic INR 08/11/2022   Thoracic aortic aneurysm 05/13/2021   Transaminitis    TRICUSPID REGURGITATION 12/23/2010   Qualifier: Diagnosis of  By: Graciela Husbands, MD, [facility], Ty Hilts     Past Surgical History:  Procedure  Laterality Date   APPENDECTOMY     CARDIAC ELECTROPHYSIOLOGY STUDY AND ABLATION     Stent and pacemaker placement   CHOLECYSTECTOMY     CORONARY ANGIOPLASTY WITH STENT PLACEMENT Left 09/13/2015   Dr. Josefa Half at [facility], LAD stent   INTRAOPERATIVE TRANSTHORACIC ECHOCARDIOGRAM N/A 10/14/2021   Procedure: INTRAOPERATIVE TRANSTHORACIC ECHOCARDIOGRAM;  Surgeon: Tonny Bollman, MD;  Location: [facility] OR;  Service: Open Heart Surgery;  Laterality: N/A;   MITRAL VALVE REPLACEMENT  2001   mechanical valve   OOPHORECTOMY     PACEMAKER INSERTION  11/20/2007   Dual chamber pacemaker implantation with rapid ventricular pacing   RIGHT/LEFT HEART CATH AND CORONARY ANGIOGRAPHY N/A 10/03/2021   Procedure: RIGHT/LEFT HEART CATH AND CORONARY ANGIOGRAPHY;  Surgeon: Tonny Bollman, MD;  Location: [facility] INVASIVE CV LAB;  Service: Cardiovascular;  Laterality: N/A;   SALPINGECTOMY Bilateral    TONSILLECTOMY     TOTAL HIP ARTHROPLASTY Right 08/12/2022   Procedure: RIGHT TOTAL HIP ARTHROPLASTY;  Surgeon: Joen Laura, MD;  Location: [facility] OR;  Service: Orthopedics;  Laterality: Right;   TRANSCATHETER AORTIC VALVE REPLACEMENT, TRANSFEMORAL N/A 10/14/2021   Procedure: TRANSCATHETER AORTIC VALVE REPLACEMENT, TRANSFEMORAL;  Surgeon: Tonny Bollman, MD;  Location: [facility] OR;  Service: Open Heart Surgery;  Laterality: N/A;    Current Medications: Current Meds  Medication Sig   Acetaminophen (TYLENOL PO) Take 1 tablet by mouth as needed (pain).   albuterol (VENTOLIN HFA) 108 (90 Base) MCG/ACT inhaler Inhale 2 puffs into the lungs as needed for shortness of breath.   alprazolam (XANAX) 2 MG tablet Take 0.5 tablets (1 mg total) by mouth 2 (two) times daily as needed for anxiety.   atorvastatin (LIPITOR) 80 MG tablet Take 80 mg by mouth at bedtime.   cyclobenzaprine (FLEXERIL) 10 MG tablet Take 10 mg by mouth daily as needed for muscle spasms.   diltiazem (DILACOR XR) 240 MG 24 hr capsule Take 240 mg by mouth daily as  needed (tachycardia).   docusate sodium (COLACE) 100 MG capsule Take 1 capsule (100 mg total) by mouth 2 (two) times daily.   furosemide (LASIX) 40 MG tablet Take 20 mg by mouth 2 (two) times daily.   hydrochlorothiazide (HYDRODIURIL) 25 MG tablet Take 25 mg by mouth daily.   levothyroxine (SYNTHROID) 75 MCG tablet Take 75 mcg by mouth daily before breakfast.   lidocaine (LIDODERM) 5 % Place 1 patch onto the skin daily. Remove & Discard patch within 12 hours or as directed by MD   Menthol, Topical Analgesic, 2 % GEL Apply 1 Application topically as needed (pain).   nitroGLYCERIN (NITROSTAT) 0.4 MG SL tablet Place 1 tablet (0.4 mg total) under the tongue every 5 (five) minutes as needed for chest pain.   pantoprazole (PROTONIX) 40 MG tablet TAKE 1 TABLET BY MOUTH EVERY DAY   potassium chloride SA (KLOR-CON M) 20 MEQ tablet Take 20 mEq by mouth 4 (four) times daily.   warfarin (COUMADIN) 5 MG tablet Take 7.5 mg by mouth every evening.   zolpidem (AMBIEN) 10 MG tablet Take 10 mg by mouth at bedtime as needed for sleep.     Allergies:   Atenolol, Metoprolol succinate [metoprolol], Baclofen, and Latex   Social History   Socioeconomic History   Marital status: Single    Spouse name: Not on file   Number of children: Not on file   Years of education: Not on file   Highest education level: Not on file  Occupational History   Not on file  Tobacco Use   Smoking status: Never   Smokeless tobacco: Never  Substance and Sexual Activity   Alcohol use: Never   Drug use: Never   Sexual activity: Not on file  Other Topics Concern   Not on file  Social History Narrative   Not on file   Social Determinants of Health   Financial Resource Strain: Not on file  Food Insecurity: Not on file  Transportation Needs: Not on file  Physical Activity: Not on file  Stress: Not on file  Social Connections: Not on file     Family History: The patient's family history includes Heart attack in her  paternal grandmother; Heart disease in her father; Leukemia in her mother; Stroke in her maternal grandmother and mother. ROS:   Please see the history of present illness.    All other systems reviewed and are negative.  EKGs/Labs/Other Studies Reviewed:    The following studies were reviewed today:  Cardiac Studies & Procedures   CARDIAC CATHETERIZATION  CARDIAC CATHETERIZATION 10/03/2021  Narrative   Mid LAD lesion is 20% stenosed.   1st Mrg lesion is 40% stenosed.   Prox RCA lesion is 30% stenosed.   Hemodynamic findings consistent with moderate pulmonary hypertension.  There is severe aortic valve stenosis.  1.  Patent coronary arteries with diffuse nonobstructive CAD, continued patency of the mid LAD stent 2.  Known severe aortic stenosis with heavy calcification and restriction of the aortic valve leaflets 3.  Normal fluoroscopic motion of the patient's mechanical mitral prosthesis 4.  Moderate pulmonary hypertension with mean PA pressure 36 mmHg, transpulmonary gradient 13 mmHg, PVR 3.8 Wood units  Recommend: Resume warfarin today, start Lovenox tomorrow morning for bridging, tentatively plan TAVR 12/27 we will coordinate hospital admission for bridging into the procedure.  Medical therapy for nonobstructive CAD.  Continue IV diuresis today with elevated filling pressures.  Findings Coronary Findings Diagnostic  Dominance: Right  Left Main Vessel is large. The vessel exhibits minimal luminal irregularities.  Left Anterior Descending Mid LAD lesion is 20% stenosed. The lesion was previously treated using a stent (unknown type) over 2 years ago. Widely patent mid LAD stent  Left Circumflex There is mild diffuse disease throughout the vessel.  First Obtuse Marginal Branch 1st Mrg lesion is 40% stenosed. The first obtuse marginal branch divides into 2 major subbranches.  The more inferior branch has 40% calcific stenosis.  Right Coronary Artery There is mild diffuse  disease throughout the vessel. Prox RCA lesion is 30% stenosed. The lesion is calcified.  Right Posterior Descending Artery The vessel exhibits minimal luminal irregularities.  Intervention  No interventions have been documented.     ECHOCARDIOGRAM  ECHOCARDIOGRAM COMPLETE 11/23/2022  Narrative ECHOCARDIOGRAM REPORT    Patient Name:   RIANA COLLINSON Date of Exam: 11/23/2022 Medical Rec #:  [id number]          Height:       58.0 in Accession #:    [id number]         Weight:       117.9 lb Date of Birth:  Aug 10, 1946          BSA:          1.455 m Patient Age:    77 years           BP:           140/76 mmHg Patient Gender: F                  HR:           80 bpm. Exam Location:  [place]  Procedure: 2D Echo, Cardiac Doppler, Color Doppler and Strain Analysis  Indications:    S/P TAVR (transcatheter aortic valve replacement) [Z95.2], H/O mitral valve replacement with mechanical valve [Z95.2]  History:        Patient has prior history of Echocardiogram examinations, most recent 12/11/2021. CHF, Previous Myocardial Infarction, Pacemaker, Arrythmias:Atrial Fibrillation; Risk Factors:Hypertension. Aortic Valve: 23 mm Sapien prosthetic, stented (TAVR) valve is present in the aortic position. Procedure Date: 10/14/2021.  Sonographer:    Louie Boston RDCS Referring Phys: [id number] Wille Celeste THOMPSON  IMPRESSIONS   1. Sigmoid septum, GLS -12.3. Left ventricular ejection fraction, by estimation, is 55 to 60%. The left ventricle has normal function. The left ventricle has no regional wall motion abnormalities. Left ventricular diastolic parameters are consistent with Grade II diastolic dysfunction (pseudonormalization). 2. Right ventricular systolic function is normal. The right ventricular size is normal. There is mildly elevated pulmonary artery systolic pressure. 3. Left atrial size was severely dilated. 4. MVR 20y ago, type, size unknown. The mitral valve has been repaired/replaced.  No evidence of mitral valve regurgitation. No evidence of mitral stenosis. The mean mitral  valve gradient is 3.0 mmHg. Echo findings are consistent with normal structure and function of the mitral valve prosthesis. 5. Tricuspid valve regurgitation is moderate. 6. The aortic valve has been repaired/replaced. Aortic valve regurgitation is not visualized. No aortic stenosis is present. There is a 23 mm Sapien prosthetic (TAVR) valve present in the aortic position. Procedure Date: 10/14/2021. Echo findings are consistent with normal structure and function of the aortic valve prosthesis. Aortic valve area, by VTI measures 1.81 cm. Aortic valve mean gradient measures 8.0 mmHg. 7. Aneurysm of the ascending aorta, measuring 41 mm. 8. The inferior vena cava is normal in size with greater than 50% respiratory variability, suggesting right atrial pressure of 3 mmHg.  FINDINGS Left Ventricle: Sigmoid septum, GLS -12.3. Left ventricular ejection fraction, by estimation, is 55 to 60%. The left ventricle has normal function. The left ventricle has no regional wall motion abnormalities. The left ventricular internal cavity size was normal in size. There is no left ventricular hypertrophy. Left ventricular diastolic parameters are consistent with Grade II diastolic dysfunction (pseudonormalization).  Right Ventricle: The right ventricular size is normal. No increase in right ventricular wall thickness. Right ventricular systolic function is normal. There is mildly elevated pulmonary artery systolic pressure. The tricuspid regurgitant velocity is 3.03 m/s, and with an assumed right atrial pressure of 8 mmHg, the estimated right ventricular systolic pressure is 44.7 mmHg.  Left Atrium: Left atrial size was severely dilated.  Right Atrium: Right atrial size was normal in size.  Pericardium: There is no evidence of pericardial effusion.  Mitral Valve: MVR 20y ago, type, size unknown. The mitral valve has been  repaired/replaced. No evidence of mitral valve regurgitation. There is a unknown size mechanical valve present in the mitral position. Echo findings are consistent with normal structure and function of the mitral valve prosthesis. No evidence of mitral valve stenosis. MV peak gradient, 8.2 mmHg. The mean mitral valve gradient is 3.0 mmHg.  Tricuspid Valve: The tricuspid valve is normal in structure. Tricuspid valve regurgitation is moderate . No evidence of tricuspid stenosis.  Aortic Valve: The aortic valve has been repaired/replaced. Aortic valve regurgitation is not visualized. No aortic stenosis is present. Aortic valve mean gradient measures 8.0 mmHg. Aortic valve peak gradient measures 14.0 mmHg. Aortic valve area, by VTI measures 1.81 cm. There is a 23 mm Sapien prosthetic, stented (TAVR) valve present in the aortic position. Procedure Date: 10/14/2021. Echo findings are consistent with normal structure and function of the aortic valve prosthesis.  Pulmonic Valve: The pulmonic valve was normal in structure. Pulmonic valve regurgitation is not visualized. No evidence of pulmonic stenosis.  Aorta: The aortic root is normal in size and structure. There is an aneurysm involving the ascending aorta measuring 41 mm.  Venous: The inferior vena cava is normal in size with greater than 50% respiratory variability, suggesting right atrial pressure of 3 mmHg.  IAS/Shunts: No atrial level shunt detected by color flow Doppler.  Additional Comments: A device lead is visualized in the right ventricle and right atrium.   LEFT VENTRICLE PLAX 2D LVIDd:         5.50 cm     Diastology LVIDs:         3.50 cm     LV e' medial:    4.53 cm/s LV PW:         1.20 cm     LV E/e' medial:  31.1 LV IVS:        1.20 cm  LV e' lateral:   5.70 cm/s LVOT diam:     2.00 cm     LV E/e' lateral: 24.7 LV SV:         66 LV SV Index:   45 LVOT Area:     3.14 cm  LV Volumes (MOD) LV vol d, MOD A2C: 65.5 ml LV vol  d, MOD A4C: 47.2 ml LV vol s, MOD A2C: 36.4 ml LV vol s, MOD A4C: 24.5 ml LV SV MOD A2C:     29.1 ml LV SV MOD A4C:     47.2 ml LV SV MOD BP:      27.7 ml  RIGHT VENTRICLE            IVC RV S prime:     6.75 cm/s  IVC diam: 2.00 cm TAPSE (M-mode): 1.7 cm  LEFT ATRIUM              Index        RIGHT ATRIUM           Index LA diam:        4.90 cm  3.37 cm/m   RA Area:     12.00 cm LA Vol (A2C):   128.0 ml 87.97 ml/m  RA Volume:   26.10 ml  17.94 ml/m LA Vol (A4C):   119.0 ml 81.79 ml/m LA Biplane Vol: 124.0 ml 85.22 ml/m AORTIC VALVE AV Area (Vmax):    1.70 cm AV Area (Vmean):   1.81 cm AV Area (VTI):     1.81 cm AV Vmax:           187.00 cm/s AV Vmean:          129.000 cm/s AV VTI:            0.362 m AV Peak Grad:      14.0 mmHg AV Mean Grad:      8.0 mmHg LVOT Vmax:         101.00 cm/s LVOT Vmean:        74.500 cm/s LVOT VTI:          0.209 m LVOT/AV VTI ratio: 0.58  AORTA Ao Root diam: 3.90 cm Ao Asc diam:  4.10 cm Ao Desc diam: 2.00 cm  MITRAL VALVE                TRICUSPID VALVE MV Area (PHT): 2.58 cm     TR Peak grad:   36.7 mmHg MV Area VTI:   2.52 cm     TR Vmax:        303.00 cm/s MV Peak grad:  8.2 mmHg MV Mean grad:  3.0 mmHg     SHUNTS MV Vmax:       1.43 m/s     Systemic VTI:  0.21 m MV Vmean:      76.5 cm/s    Systemic Diam: 2.00 cm MV Decel Time: 294 msec MV E velocity: 141.00 cm/s MV A velocity: 0.61 cm/s MV E/A ratio:  230.39  Gypsy Balsam MD Electronically signed by Gypsy Balsam MD Signature Date/Time: 11/23/2022/2:53:13 PM    Final     CT SCANS  CT CORONARY MORPH W/CTA COR W/SCORE 10/02/2021  Addendum 10/02/2021  9:43 AM ADDENDUM REPORT: 10/02/2021 09:41  CLINICAL DATA:  Aortic stenosis  EXAM: Cardiac TAVR CT  TECHNIQUE: The patient was scanned on a Siemens Force 192 slice scanner. A 120 kV retrospective scan was triggered in the descending thoracic aorta at 111 HU's. Gantry rotation  speed was 270 msecs and  collimation was .9 mm. No beta blockade or nitro were given. The 3D data set was reconstructed in 5% intervals of the R-R cycle. Systolic and diastolic phases were analyzed on a dedicated work station using MPR, MIP and VRT modes. The patient received 80 cc of contrast.  FINDINGS: Aortic Valve: Tri leaflet calcified with restricted leaflet motion Calcium score > 5000  Aorta: Heavily calcified mildly dilated ascending thoracic aorta severe calcific atherosclerosis  Sinotubular Junction: 27 mm  Ascending Thoracic Aorta: 39 mm  Descending Thoracic Aorta: 24 mm  Sinus of Valsalva Measurements:  Non-coronary: 34.8 mm  Right - coronary: 33.6 mm  Left - coronary: 32.4 mm  Coronary Artery Height above Annulus:  Left Main: 11.7 mm above annulus  Right Coronary: 11.1 mm above annulus  Virtual Basal Annulus Measurements:  Maximum/Minimum Diameter: 27 mm x 19.4 mm  Perimeter: 77.6 mm  Area: 441 mm2 30% phase  Coronary Arteries: Sufficient height above annulus for deployment  Optimum Fluoroscopic Angle for Delivery: LAO 12 Caudal 12 degrees  IMPRESSION: 1. Calcified tri-leaflet AV with score >5,000  2.  Annular area of 441 mm2 suitable for a 26 mm Sapien 3 valve  3. Normal appearing bileaflet mechanical MVR with no pannus and good opening /closing angles  4.  Mixing artifact in LAA no delayed images done  5.  Coronary arteries sufficient height above annulus for deployment  6. Optimum angiographic angle for deployment LAO 12 Caudal 12 degrees  7.  Patient appearing stent in LAD  8.  Pacing wires in RA/RV  9.  Dilated main PA 4.1 cm suggesting elevated PA systolic pressures  Charlton Haws   Electronically Signed By: Charlton Haws M.D. On: 10/02/2021 09:41  Narrative EXAM: OVER-READ INTERPRETATION  CT CHEST  The following report is an over-read performed by radiologist Dr. Trudie Reed of [facility] on 10/02/2021. This over-read does not  include interpretation of cardiac or coronary anatomy or pathology. The coronary calcium score/coronary CTA interpretation by the cardiologist is attached.  COMPARISON:  Chest CT 07/22/2021.  FINDINGS: Extracardiac findings will be described separately under dictation for contemporaneously obtained CTA chest, abdomen and pelvis.  IMPRESSION: Please see separate dictation for contemporaneously obtained CTA chest, abdomen and pelvis dated 10/02/2021 for full description of relevant extracardiac findings.  Electronically Signed: By: Trudie Reed M.D. On: 10/02/2021 09:14           Recent Labs: 08/11/2022: ALT 18; Magnesium 1.9; TSH 10.380 08/17/2022: BUN 13; Creatinine, Ser 0.81; Potassium 3.9; Sodium 134 08/18/2022: Hemoglobin 10.0; Platelets 178  Recent Lipid Panel    Component Value Date/Time   CHOL  02/02/2008 0435    175        ATP III CLASSIFICATION:  <200     mg/dL   Desirable  161-096  mg/dL   Borderline High  >=045    mg/dL   High   TRIG 409 81/19/1478 0435   HDL 33 (L) 02/02/2008 0435   CHOLHDL 5.3 02/02/2008 0435   VLDL 25 02/02/2008 0435   LDLCALC (H) 02/02/2008 0435    117        Total Cholesterol/HDL:CHD Risk Coronary Heart Disease Risk Table                     Men   Women  1/2 Average Risk   3.4   3.3    Physical Exam:    VS:  BP 130/70   Pulse 70  Ht 4\' 10"  (1.473 m)   Wt 120 lb 3.2 oz (54.5 kg)   SpO2 94%   BMI 25.12 kg/m     Wt Readings from Last 3 Encounters:  03/17/23 120 lb 3.2 oz (54.5 kg)  08/15/22 117 lb 15.1 oz (53.5 kg)  05/18/22 113 lb (51.3 kg)     GEN:  Well nourished, well developed in no acute distress HEENT: Normal NECK: No JVD; No carotid bruits LYMPHATICS: No lymphadenopathy CARDIAC: Sharp closing sound prosthesis no AR MR RRR, no murmurs, rubs, gallops RESPIRATORY:  Clear to auscultation without rales, wheezing or rhonchi  ABDOMEN: Soft, non-tender, non-distended MUSCULOSKELETAL:  No edema; No deformity   SKIN: Warm and dry NEUROLOGIC:  Alert and oriented x 3 PSYCHIATRIC:  Normal affect    Signed, Norman Herrlich, MD  03/17/2023 1:25 PM    [facility]

## 2023-03-17 ENCOUNTER — Ambulatory Visit: Payer: Medicare Other | Attending: Cardiology | Admitting: Cardiology

## 2023-03-17 ENCOUNTER — Encounter: Payer: Self-pay | Admitting: Cardiology

## 2023-03-17 VITALS — BP 130/70 | HR 70 | Ht <= 58 in | Wt 120.2 lb

## 2023-03-17 DIAGNOSIS — Z952 Presence of prosthetic heart valve: Secondary | ICD-10-CM

## 2023-03-17 DIAGNOSIS — Z950 Presence of cardiac pacemaker: Secondary | ICD-10-CM | POA: Diagnosis not present

## 2023-03-17 DIAGNOSIS — Z7901 Long term (current) use of anticoagulants: Secondary | ICD-10-CM | POA: Diagnosis not present

## 2023-03-17 DIAGNOSIS — I10 Essential (primary) hypertension: Secondary | ICD-10-CM | POA: Diagnosis not present

## 2023-03-17 DIAGNOSIS — I480 Paroxysmal atrial fibrillation: Secondary | ICD-10-CM

## 2023-03-17 MED ORDER — ATORVASTATIN CALCIUM 40 MG PO TABS: 40.0000 mg | ORAL_TABLET | ORAL | 3 refills | Status: AC

## 2023-03-17 NOTE — Addendum Note (Signed)
Addended by: Roxanne Mins I on: 03/17/2023 01:57 PM   Modules accepted: Orders

## 2023-03-17 NOTE — Patient Instructions (Signed)
Medication Instructions:  Your physician has recommended you make the following change in your medication:   START: Atorvastatin 40 mg 2 times weekly  *If you need a refill on your cardiac medications before your next appointment, please call your pharmacy*   Lab Work: None If you have labs (blood work) drawn today and your tests are completely normal, you will receive your results only by: MyChart Message (if you have MyChart) OR A paper copy in the mail If you have any lab test that is abnormal or we need to change your treatment, we will call you to review the results.   Testing/Procedures: Your physician has requested that you have an echocardiogram. Echocardiography is a painless test that uses sound waves to create images of your heart. It provides your doctor with information about the size and shape of your heart and how well your heart's chambers and valves are working. This procedure takes approximately one hour. There are no restrictions for this procedure. Please do NOT wear cologne, perfume, aftershave, or lotions (deodorant is allowed). Please arrive 15 minutes prior to your appointment time.    Follow-Up: At [facility], you and your health needs are our priority.  As part of our continuing mission to provide you with exceptional heart care, we have created designated Provider Care Teams.  These Care Teams include your primary Cardiologist (physician) and Advanced Practice Providers (APPs -  Physician Assistants and Nurse Practitioners) who all work together to provide you with the care you need, when you need it.  We recommend signing up for the patient portal called "MyChart".  Sign up information is provided on this After Visit Summary.  MyChart is used to connect with patients for Virtual Visits (Telemedicine).  Patients are able to view lab/test results, encounter notes, upcoming appointments, etc.  Non-urgent messages can be sent to your provider as well.    To learn more about what you can do with MyChart, go to [email or web address].    Your next appointment:   1 year(s)  Provider:   Norman Herrlich, MD    Other Instructions None

## 2023-03-19 DIAGNOSIS — I10 Essential (primary) hypertension: Secondary | ICD-10-CM | POA: Diagnosis not present

## 2023-03-19 DIAGNOSIS — I4891 Unspecified atrial fibrillation: Secondary | ICD-10-CM | POA: Diagnosis not present

## 2023-03-19 DIAGNOSIS — I509 Heart failure, unspecified: Secondary | ICD-10-CM | POA: Diagnosis not present

## 2023-04-01 DIAGNOSIS — Z952 Presence of prosthetic heart valve: Secondary | ICD-10-CM | POA: Diagnosis not present

## 2023-04-01 DIAGNOSIS — Z7901 Long term (current) use of anticoagulants: Secondary | ICD-10-CM | POA: Diagnosis not present

## 2023-04-07 DIAGNOSIS — I5020 Unspecified systolic (congestive) heart failure: Secondary | ICD-10-CM | POA: Diagnosis not present

## 2023-04-28 ENCOUNTER — Other Ambulatory Visit: Payer: Self-pay | Admitting: Physician Assistant

## 2023-05-07 DIAGNOSIS — I5020 Unspecified systolic (congestive) heart failure: Secondary | ICD-10-CM | POA: Diagnosis not present

## 2023-05-14 DIAGNOSIS — R3915 Urgency of urination: Secondary | ICD-10-CM | POA: Diagnosis not present

## 2023-06-07 DIAGNOSIS — I5020 Unspecified systolic (congestive) heart failure: Secondary | ICD-10-CM | POA: Diagnosis not present

## 2023-06-23 DIAGNOSIS — E039 Hypothyroidism, unspecified: Secondary | ICD-10-CM | POA: Diagnosis not present

## 2023-06-23 DIAGNOSIS — I10 Essential (primary) hypertension: Secondary | ICD-10-CM | POA: Diagnosis not present

## 2023-06-23 DIAGNOSIS — I4891 Unspecified atrial fibrillation: Secondary | ICD-10-CM | POA: Diagnosis not present

## 2023-06-23 DIAGNOSIS — G4700 Insomnia, unspecified: Secondary | ICD-10-CM | POA: Diagnosis not present

## 2023-06-23 DIAGNOSIS — Z7901 Long term (current) use of anticoagulants: Secondary | ICD-10-CM | POA: Diagnosis not present

## 2023-07-06 DIAGNOSIS — Z952 Presence of prosthetic heart valve: Secondary | ICD-10-CM | POA: Diagnosis not present

## 2023-07-06 DIAGNOSIS — Z7901 Long term (current) use of anticoagulants: Secondary | ICD-10-CM | POA: Diagnosis not present

## 2023-08-12 DIAGNOSIS — Z7901 Long term (current) use of anticoagulants: Secondary | ICD-10-CM | POA: Diagnosis not present

## 2023-08-12 DIAGNOSIS — Z952 Presence of prosthetic heart valve: Secondary | ICD-10-CM | POA: Diagnosis not present

## 2023-09-11 ENCOUNTER — Encounter (HOSPITAL_COMMUNITY): Payer: Self-pay

## 2023-09-11 DIAGNOSIS — I7101 Dissection of ascending aorta: Secondary | ICD-10-CM | POA: Diagnosis not present

## 2023-09-11 DIAGNOSIS — R0689 Other abnormalities of breathing: Secondary | ICD-10-CM | POA: Diagnosis not present

## 2023-09-11 DIAGNOSIS — Z743 Need for continuous supervision: Secondary | ICD-10-CM | POA: Diagnosis not present

## 2023-09-11 DIAGNOSIS — I444 Left anterior fascicular block: Secondary | ICD-10-CM | POA: Diagnosis not present

## 2023-09-11 DIAGNOSIS — I252 Old myocardial infarction: Secondary | ICD-10-CM | POA: Diagnosis not present

## 2023-09-11 DIAGNOSIS — I491 Atrial premature depolarization: Secondary | ICD-10-CM | POA: Diagnosis not present

## 2023-09-11 DIAGNOSIS — I422 Other hypertrophic cardiomyopathy: Secondary | ICD-10-CM | POA: Diagnosis not present

## 2023-09-11 DIAGNOSIS — E876 Hypokalemia: Secondary | ICD-10-CM | POA: Diagnosis not present

## 2023-09-11 DIAGNOSIS — R9431 Abnormal electrocardiogram [ECG] [EKG]: Secondary | ICD-10-CM | POA: Diagnosis not present

## 2023-09-11 DIAGNOSIS — I7121 Aneurysm of the ascending aorta, without rupture: Secondary | ICD-10-CM | POA: Diagnosis not present

## 2023-09-11 DIAGNOSIS — I4729 Other ventricular tachycardia: Secondary | ICD-10-CM | POA: Diagnosis not present

## 2023-09-11 DIAGNOSIS — I499 Cardiac arrhythmia, unspecified: Secondary | ICD-10-CM | POA: Diagnosis not present

## 2023-09-11 DIAGNOSIS — Z9889 Other specified postprocedural states: Secondary | ICD-10-CM | POA: Diagnosis not present

## 2023-09-11 DIAGNOSIS — R000 Tachycardia, unspecified: Secondary | ICD-10-CM | POA: Diagnosis not present

## 2023-09-12 ENCOUNTER — Inpatient Hospital Stay (HOSPITAL_COMMUNITY)
Admission: AD | Admit: 2023-09-12 | Discharge: 2023-09-18 | DRG: 259 | Disposition: A | Discharge status: Home Health | Payer: Medicare Other | Source: Other Acute Inpatient Hospital | Attending: Internal Medicine | Admitting: Internal Medicine

## 2023-09-12 ENCOUNTER — Inpatient Hospital Stay (HOSPITAL_COMMUNITY): Payer: Medicare Other

## 2023-09-12 ENCOUNTER — Encounter (HOSPITAL_COMMUNITY): Payer: Self-pay | Admitting: Pulmonary Disease

## 2023-09-12 ENCOUNTER — Emergency Department (HOSPITAL_COMMUNITY): Admission: EM | Admit: 2023-09-12 | Payer: Medicare Other | Source: Home / Self Care

## 2023-09-12 ENCOUNTER — Other Ambulatory Visit: Payer: Self-pay

## 2023-09-12 DIAGNOSIS — I10 Essential (primary) hypertension: Secondary | ICD-10-CM | POA: Diagnosis not present

## 2023-09-12 DIAGNOSIS — F419 Anxiety disorder, unspecified: Secondary | ICD-10-CM | POA: Diagnosis present

## 2023-09-12 DIAGNOSIS — E876 Hypokalemia: Secondary | ICD-10-CM | POA: Diagnosis not present

## 2023-09-12 DIAGNOSIS — I5042 Chronic combined systolic (congestive) and diastolic (congestive) heart failure: Secondary | ICD-10-CM

## 2023-09-12 DIAGNOSIS — Z91040 Latex allergy status: Secondary | ICD-10-CM

## 2023-09-12 DIAGNOSIS — E1165 Type 2 diabetes mellitus with hyperglycemia: Secondary | ICD-10-CM | POA: Diagnosis not present

## 2023-09-12 DIAGNOSIS — Z45018 Encounter for adjustment and management of other part of cardiac pacemaker: Secondary | ICD-10-CM | POA: Diagnosis not present

## 2023-09-12 DIAGNOSIS — Z952 Presence of prosthetic heart valve: Secondary | ICD-10-CM | POA: Diagnosis not present

## 2023-09-12 DIAGNOSIS — Z79890 Hormone replacement therapy (postmenopausal): Secondary | ICD-10-CM

## 2023-09-12 DIAGNOSIS — I428 Other cardiomyopathies: Secondary | ICD-10-CM | POA: Diagnosis present

## 2023-09-12 DIAGNOSIS — Z950 Presence of cardiac pacemaker: Secondary | ICD-10-CM | POA: Diagnosis not present

## 2023-09-12 DIAGNOSIS — I5022 Chronic systolic (congestive) heart failure: Secondary | ICD-10-CM | POA: Diagnosis not present

## 2023-09-12 DIAGNOSIS — M546 Pain in thoracic spine: Secondary | ICD-10-CM | POA: Diagnosis present

## 2023-09-12 DIAGNOSIS — I7101 Dissection of ascending aorta: Principal | ICD-10-CM | POA: Diagnosis present

## 2023-09-12 DIAGNOSIS — Z823 Family history of stroke: Secondary | ICD-10-CM

## 2023-09-12 DIAGNOSIS — I482 Chronic atrial fibrillation, unspecified: Secondary | ICD-10-CM | POA: Diagnosis present

## 2023-09-12 DIAGNOSIS — K5900 Constipation, unspecified: Secondary | ICD-10-CM | POA: Diagnosis not present

## 2023-09-12 DIAGNOSIS — R791 Abnormal coagulation profile: Secondary | ICD-10-CM | POA: Diagnosis not present

## 2023-09-12 DIAGNOSIS — T82118A Breakdown (mechanical) of other cardiac electronic device, initial encounter: Secondary | ICD-10-CM | POA: Insufficient documentation

## 2023-09-12 DIAGNOSIS — I420 Dilated cardiomyopathy: Secondary | ICD-10-CM | POA: Diagnosis not present

## 2023-09-12 DIAGNOSIS — I5032 Chronic diastolic (congestive) heart failure: Secondary | ICD-10-CM | POA: Diagnosis present

## 2023-09-12 DIAGNOSIS — T82111S Breakdown (mechanical) of cardiac pulse generator (battery), sequela: Secondary | ICD-10-CM

## 2023-09-12 DIAGNOSIS — I4510 Unspecified right bundle-branch block: Secondary | ICD-10-CM | POA: Diagnosis not present

## 2023-09-12 DIAGNOSIS — I719 Aortic aneurysm of unspecified site, without rupture: Secondary | ICD-10-CM | POA: Diagnosis not present

## 2023-09-12 DIAGNOSIS — F0390 Unspecified dementia without behavioral disturbance: Secondary | ICD-10-CM | POA: Diagnosis present

## 2023-09-12 DIAGNOSIS — I700 Atherosclerosis of aorta: Secondary | ICD-10-CM | POA: Diagnosis not present

## 2023-09-12 DIAGNOSIS — Z9049 Acquired absence of other specified parts of digestive tract: Secondary | ICD-10-CM

## 2023-09-12 DIAGNOSIS — I255 Ischemic cardiomyopathy: Secondary | ICD-10-CM | POA: Diagnosis not present

## 2023-09-12 DIAGNOSIS — Z8249 Family history of ischemic heart disease and other diseases of the circulatory system: Secondary | ICD-10-CM

## 2023-09-12 DIAGNOSIS — I495 Sick sinus syndrome: Secondary | ICD-10-CM | POA: Diagnosis present

## 2023-09-12 DIAGNOSIS — I130 Hypertensive heart and chronic kidney disease with heart failure and stage 1 through stage 4 chronic kidney disease, or unspecified chronic kidney disease: Secondary | ICD-10-CM | POA: Diagnosis not present

## 2023-09-12 DIAGNOSIS — I272 Pulmonary hypertension, unspecified: Secondary | ICD-10-CM | POA: Diagnosis present

## 2023-09-12 DIAGNOSIS — I97190 Other postprocedural cardiac functional disturbances following cardiac surgery: Secondary | ICD-10-CM | POA: Diagnosis not present

## 2023-09-12 DIAGNOSIS — Z79899 Other long term (current) drug therapy: Secondary | ICD-10-CM

## 2023-09-12 DIAGNOSIS — Z45010 Encounter for checking and testing of cardiac pacemaker pulse generator [battery]: Secondary | ICD-10-CM | POA: Diagnosis not present

## 2023-09-12 DIAGNOSIS — Z955 Presence of coronary angioplasty implant and graft: Secondary | ICD-10-CM

## 2023-09-12 DIAGNOSIS — I517 Cardiomegaly: Secondary | ICD-10-CM | POA: Diagnosis not present

## 2023-09-12 DIAGNOSIS — Z96641 Presence of right artificial hip joint: Secondary | ICD-10-CM | POA: Diagnosis present

## 2023-09-12 DIAGNOSIS — I7100 Dissection of unspecified site of aorta: Principal | ICD-10-CM | POA: Diagnosis present

## 2023-09-12 DIAGNOSIS — N183 Chronic kidney disease, stage 3 unspecified: Secondary | ICD-10-CM | POA: Diagnosis present

## 2023-09-12 DIAGNOSIS — E039 Hypothyroidism, unspecified: Secondary | ICD-10-CM | POA: Diagnosis not present

## 2023-09-12 DIAGNOSIS — R0602 Shortness of breath: Secondary | ICD-10-CM | POA: Diagnosis present

## 2023-09-12 DIAGNOSIS — J9811 Atelectasis: Secondary | ICD-10-CM | POA: Diagnosis not present

## 2023-09-12 DIAGNOSIS — I2510 Atherosclerotic heart disease of native coronary artery without angina pectoris: Secondary | ICD-10-CM | POA: Diagnosis not present

## 2023-09-12 DIAGNOSIS — R0609 Other forms of dyspnea: Secondary | ICD-10-CM | POA: Diagnosis not present

## 2023-09-12 DIAGNOSIS — I7121 Aneurysm of the ascending aorta, without rupture: Secondary | ICD-10-CM | POA: Diagnosis not present

## 2023-09-12 DIAGNOSIS — Z806 Family history of leukemia: Secondary | ICD-10-CM

## 2023-09-12 DIAGNOSIS — I71012 Dissection of descending thoracic aorta: Secondary | ICD-10-CM | POA: Diagnosis not present

## 2023-09-12 DIAGNOSIS — I7103 Dissection of thoracoabdominal aorta: Secondary | ICD-10-CM | POA: Diagnosis not present

## 2023-09-12 DIAGNOSIS — E1122 Type 2 diabetes mellitus with diabetic chronic kidney disease: Secondary | ICD-10-CM | POA: Diagnosis present

## 2023-09-12 DIAGNOSIS — M40209 Unspecified kyphosis, site unspecified: Secondary | ICD-10-CM | POA: Diagnosis present

## 2023-09-12 DIAGNOSIS — I71019 Dissection of thoracic aorta, unspecified: Secondary | ICD-10-CM

## 2023-09-12 DIAGNOSIS — E871 Hypo-osmolality and hyponatremia: Secondary | ICD-10-CM | POA: Diagnosis not present

## 2023-09-12 DIAGNOSIS — Z8679 Personal history of other diseases of the circulatory system: Secondary | ICD-10-CM | POA: Diagnosis not present

## 2023-09-12 DIAGNOSIS — T82110D Breakdown (mechanical) of cardiac electrode, subsequent encounter: Secondary | ICD-10-CM | POA: Diagnosis not present

## 2023-09-12 DIAGNOSIS — G8929 Other chronic pain: Secondary | ICD-10-CM | POA: Diagnosis not present

## 2023-09-12 DIAGNOSIS — E782 Mixed hyperlipidemia: Secondary | ICD-10-CM | POA: Diagnosis present

## 2023-09-12 DIAGNOSIS — T82111A Breakdown (mechanical) of cardiac pulse generator (battery), initial encounter: Secondary | ICD-10-CM | POA: Diagnosis not present

## 2023-09-12 DIAGNOSIS — Z888 Allergy status to other drugs, medicaments and biological substances status: Secondary | ICD-10-CM

## 2023-09-12 DIAGNOSIS — I5020 Unspecified systolic (congestive) heart failure: Secondary | ICD-10-CM | POA: Diagnosis not present

## 2023-09-12 DIAGNOSIS — I4729 Other ventricular tachycardia: Secondary | ICD-10-CM | POA: Diagnosis not present

## 2023-09-12 DIAGNOSIS — Z7901 Long term (current) use of anticoagulants: Secondary | ICD-10-CM

## 2023-09-12 DIAGNOSIS — J449 Chronic obstructive pulmonary disease, unspecified: Secondary | ICD-10-CM | POA: Diagnosis present

## 2023-09-12 DIAGNOSIS — M799 Soft tissue disorder, unspecified: Secondary | ICD-10-CM | POA: Diagnosis not present

## 2023-09-12 DIAGNOSIS — R0600 Dyspnea, unspecified: Secondary | ICD-10-CM | POA: Diagnosis not present

## 2023-09-12 DIAGNOSIS — T82118D Breakdown (mechanical) of other cardiac electronic device, subsequent encounter: Secondary | ICD-10-CM | POA: Diagnosis not present

## 2023-09-12 DIAGNOSIS — R918 Other nonspecific abnormal finding of lung field: Secondary | ICD-10-CM | POA: Diagnosis not present

## 2023-09-12 DIAGNOSIS — I252 Old myocardial infarction: Secondary | ICD-10-CM

## 2023-09-12 LAB — CBC
HCT: 39.5 % (ref 36.0–46.0)
Hemoglobin: 13.2 g/dL (ref 12.0–15.0)
MCH: 28.4 pg (ref 26.0–34.0)
MCHC: 33.4 g/dL (ref 30.0–36.0)
MCV: 85.1 fL (ref 80.0–100.0)
Platelets: 201 10*3/uL (ref 150–400)
RBC: 4.64 MIL/uL (ref 3.87–5.11)
RDW: 15.5 % (ref 11.5–15.5)
WBC: 6.7 10*3/uL (ref 4.0–10.5)
nRBC: 0 % (ref 0.0–0.2)

## 2023-09-12 LAB — PROTIME-INR
INR: 2.2 — ABNORMAL HIGH (ref 0.8–1.2)
Prothrombin Time: 24.5 s — ABNORMAL HIGH (ref 11.4–15.2)

## 2023-09-12 LAB — URINALYSIS, ROUTINE W REFLEX MICROSCOPIC
Bilirubin Urine: NEGATIVE
Glucose, UA: NEGATIVE mg/dL
Hgb urine dipstick: NEGATIVE
Ketones, ur: NEGATIVE mg/dL
Leukocytes,Ua: NEGATIVE
Nitrite: POSITIVE — AB
Protein, ur: NEGATIVE mg/dL
Specific Gravity, Urine: 1.017 (ref 1.005–1.030)
pH: 7 (ref 5.0–8.0)

## 2023-09-12 LAB — COMPREHENSIVE METABOLIC PANEL
ALT: 14 U/L (ref 0–44)
AST: 25 U/L (ref 15–41)
Albumin: 3.9 g/dL (ref 3.5–5.0)
Alkaline Phosphatase: 71 U/L (ref 38–126)
Anion gap: 12 (ref 5–15)
BUN: 11 mg/dL (ref 8–23)
CO2: 25 mmol/L (ref 22–32)
Calcium: 8.9 mg/dL (ref 8.9–10.3)
Chloride: 96 mmol/L — ABNORMAL LOW (ref 98–111)
Creatinine, Ser: 1.21 mg/dL — ABNORMAL HIGH (ref 0.44–1.00)
GFR, Estimated: 46 mL/min — ABNORMAL LOW (ref >60)
Glucose, Bld: 109 mg/dL — ABNORMAL HIGH (ref 70–99)
Potassium: 2.7 mmol/L — CL (ref 3.5–5.1)
Sodium: 133 mmol/L — ABNORMAL LOW (ref 135–145)
Total Bilirubin: 1.5 mg/dL — ABNORMAL HIGH (ref <1.2)
Total Protein: 6.9 g/dL (ref 6.5–8.1)

## 2023-09-12 LAB — MAGNESIUM: Magnesium: 2.3 mg/dL (ref 1.7–2.4)

## 2023-09-12 LAB — GLUCOSE, CAPILLARY
Glucose-Capillary: 108 mg/dL — ABNORMAL HIGH (ref 70–99)
Glucose-Capillary: 110 mg/dL — ABNORMAL HIGH (ref 70–99)
Glucose-Capillary: 117 mg/dL — ABNORMAL HIGH (ref 70–99)
Glucose-Capillary: 119 mg/dL — ABNORMAL HIGH (ref 70–99)
Glucose-Capillary: 124 mg/dL — ABNORMAL HIGH (ref 70–99)

## 2023-09-12 LAB — BASIC METABOLIC PANEL
Anion gap: 9 (ref 5–15)
BUN: 12 mg/dL (ref 8–23)
CO2: 25 mmol/L (ref 22–32)
Calcium: 8.8 mg/dL — ABNORMAL LOW (ref 8.9–10.3)
Chloride: 97 mmol/L — ABNORMAL LOW (ref 98–111)
Creatinine, Ser: 1.02 mg/dL — ABNORMAL HIGH (ref 0.44–1.00)
GFR, Estimated: 57 mL/min — ABNORMAL LOW (ref >60)
Glucose, Bld: 118 mg/dL — ABNORMAL HIGH (ref 70–99)
Potassium: 4.8 mmol/L (ref 3.5–5.1)
Sodium: 131 mmol/L — ABNORMAL LOW (ref 135–145)

## 2023-09-12 LAB — HEMOGLOBIN A1C
Hgb A1c MFr Bld: 5.6 % (ref 4.8–5.6)
Mean Plasma Glucose: 114.02 mg/dL

## 2023-09-12 LAB — TROPONIN I (HIGH SENSITIVITY): Troponin I (High Sensitivity): 24 ng/L — ABNORMAL HIGH (ref <18)

## 2023-09-12 LAB — BRAIN NATRIURETIC PEPTIDE: B Natriuretic Peptide: 419.8 pg/mL — ABNORMAL HIGH (ref 0.0–100.0)

## 2023-09-12 LAB — LACTIC ACID, PLASMA: Lactic Acid, Venous: 1.1 mmol/L (ref 0.5–1.9)

## 2023-09-12 LAB — TYPE AND SCREEN
ABO/RH(D): O POS
Antibody Screen: NEGATIVE

## 2023-09-12 LAB — PHOSPHORUS: Phosphorus: 2.1 mg/dL — ABNORMAL LOW (ref 2.5–4.6)

## 2023-09-12 MED ORDER — FUROSEMIDE 10 MG/ML IJ SOLN
40.0000 mg | Freq: Once | INTRAMUSCULAR | Status: AC
  Administered 2023-09-12: 40 mg via INTRAVENOUS
  Filled 2023-09-12: qty 4

## 2023-09-12 MED ORDER — PANTOPRAZOLE SODIUM 40 MG PO TBEC
40.0000 mg | DELAYED_RELEASE_TABLET | Freq: Every day | ORAL | Status: DC
  Administered 2023-09-12 – 2023-09-18 (×7): 40 mg via ORAL
  Filled 2023-09-12 (×8): qty 1

## 2023-09-12 MED ORDER — ALBUTEROL SULFATE (2.5 MG/3ML) 0.083% IN NEBU: 2.5000 mg | INHALATION_SOLUTION | RESPIRATORY_TRACT | Status: DC | PRN

## 2023-09-12 MED ORDER — DILTIAZEM HCL-DEXTROSE 125-5 MG/125ML-% IV SOLN (PREMIX)
5.0000 mg/h | INTRAVENOUS | Status: DC
  Administered 2023-09-12: 5 mg/h via INTRAVENOUS
  Filled 2023-09-12 (×2): qty 125

## 2023-09-12 MED ORDER — ALPRAZOLAM 0.5 MG PO TABS
2.0000 mg | ORAL_TABLET | Freq: Two times a day (BID) | ORAL | Status: DC
  Administered 2023-09-12 – 2023-09-13 (×4): 2 mg via ORAL
  Administered 2023-09-14: 1 mg via ORAL
  Administered 2023-09-14 – 2023-09-16 (×5): 2 mg via ORAL
  Administered 2023-09-17 – 2023-09-18 (×3): 1.5 mg via ORAL
  Filled 2023-09-12 (×15): qty 4

## 2023-09-12 MED ORDER — MAGNESIUM SULFATE 2 GM/50ML IV SOLN
2.0000 g | Freq: Once | INTRAVENOUS | Status: AC
  Administered 2023-09-12: 2 g via INTRAVENOUS
  Filled 2023-09-12: qty 50

## 2023-09-12 MED ORDER — POTASSIUM CHLORIDE CRYS ER 20 MEQ PO TBCR
40.0000 meq | EXTENDED_RELEASE_TABLET | Freq: Once | ORAL | Status: AC
  Administered 2023-09-12: 40 meq via ORAL
  Filled 2023-09-12: qty 2

## 2023-09-12 MED ORDER — INSULIN ASPART 100 UNIT/ML IJ SOLN
0.0000 [IU] | INTRAMUSCULAR | Status: DC
  Administered 2023-09-13: 2 [IU] via SUBCUTANEOUS
  Administered 2023-09-13: 1 [IU] via SUBCUTANEOUS

## 2023-09-12 MED ORDER — ACETAMINOPHEN 325 MG PO TABS
650.0000 mg | ORAL_TABLET | ORAL | Status: DC | PRN
  Administered 2023-09-12: 650 mg via ORAL
  Filled 2023-09-12: qty 2

## 2023-09-12 MED ORDER — POTASSIUM CHLORIDE CRYS ER 20 MEQ PO TBCR
20.0000 meq | EXTENDED_RELEASE_TABLET | Freq: Once | ORAL | Status: AC
  Administered 2023-09-12: 20 meq via ORAL
  Filled 2023-09-12: qty 1

## 2023-09-12 MED ORDER — POLYETHYLENE GLYCOL 3350 17 G PO PACK: 17.0000 g | PACK | Freq: Every day | ORAL | Status: DC | PRN

## 2023-09-12 MED ORDER — DOCUSATE SODIUM 100 MG PO CAPS: 100.0000 mg | ORAL_CAPSULE | Freq: Two times a day (BID) | ORAL | Status: DC | PRN

## 2023-09-12 MED ORDER — FUROSEMIDE 40 MG PO TABS
40.0000 mg | ORAL_TABLET | Freq: Every day | ORAL | Status: DC
  Administered 2023-09-12 – 2023-09-18 (×6): 40 mg via ORAL
  Filled 2023-09-12 (×6): qty 1

## 2023-09-12 MED ORDER — CHLORHEXIDINE GLUCONATE CLOTH 2 % EX PADS
6.0000 | MEDICATED_PAD | Freq: Every day | CUTANEOUS | Status: DC
  Administered 2023-09-13 – 2023-09-14 (×2): 6 via TOPICAL

## 2023-09-12 MED ORDER — LEVOTHYROXINE SODIUM 75 MCG PO TABS
75.0000 ug | ORAL_TABLET | Freq: Every day | ORAL | Status: DC
  Administered 2023-09-12 – 2023-09-18 (×6): 75 ug via ORAL
  Filled 2023-09-12 (×6): qty 1

## 2023-09-12 MED ORDER — POTASSIUM CHLORIDE 10 MEQ/100ML IV SOLN
10.0000 meq | INTRAVENOUS | Status: AC
  Administered 2023-09-12 (×3): 10 meq via INTRAVENOUS
  Filled 2023-09-12 (×3): qty 100

## 2023-09-12 MED ORDER — POTASSIUM CHLORIDE CRYS ER 20 MEQ PO TBCR
60.0000 meq | EXTENDED_RELEASE_TABLET | Freq: Once | ORAL | Status: AC
  Administered 2023-09-12: 60 meq via ORAL
  Filled 2023-09-12: qty 3

## 2023-09-12 MED ORDER — OXYCODONE HCL 5 MG PO TABS
5.0000 mg | ORAL_TABLET | Freq: Four times a day (QID) | ORAL | Status: DC | PRN
  Administered 2023-09-12 – 2023-09-18 (×7): 5 mg via ORAL
  Filled 2023-09-12 (×7): qty 1

## 2023-09-12 MED ORDER — ORAL CARE MOUTH RINSE: 15.0000 mL | OROMUCOSAL | Status: DC | PRN

## 2023-09-12 NOTE — Consult Note (Addendum)
Cardiology Consultation   Patient ID: [name] [name] MRN: [id number]; DOB: 09-Aug-1946  Admit date: 09/12/2023 Date of Consult: 09/12/2023  PCP:  Marylen Ponto, MD   [facility] Providers Cardiologist:  Norman Herrlich, MD  Electrophysiology: Dr. Tollie Pizza Structural heart clinic: Dr. Excell Seltzer   Patient Profile:   [name] [name] is a 77 y.o. 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 Chestine Spore.  History of Present Illness:   Ms. 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  Her device is being followed by Dr. Tollie Pizza at [facility].  The last time she was evaluated by Dr. Tollie Pizza was in October 2022 at that time it was mentioned that she had a device report from March 2022 that gave the estimated longevity of 26 months.  I do not see any device interrogation since.  Patient on the left and right heart cath on 10/03/2021 which showed 20% mid LAD lesion, patent mid LAD stent, 40% OM1 lesion, 30% proximal RCA lesion, severe aortic valve stenosis, moderate pulmonary hypertension.  She ultimately underwent TAVR procedure using Edwards SAPIEN 3 Ultra Resilia THV size 23 mm by Dr. Laneta Simmers on 10/14/2021.  EF was 30 to 35% in December 2022.  Since her TAVR procedure, ejection fraction has slowly improved to 50 to 55% in March 2023 and 55 to 60% in February 2024.  Most recent echocardiogram on 11/23/2022 showed EF 55 to 60%, grade 2 diastolic dysfunction, mildly elevated RVSP with normal RVEF, severely dilated left atrial size, replaced  mitral valve with mean mitral valve gradient of 3.0 mmHg consistent with normal structure and function, moderate TR, 23 mm sapient prosthetic valve present in the aortic valve location was normal function, mean aortic valve gradient 8 mmHg, aneurysm of ascending aorta measuring at 41 mm.  She is on hydrochlorothiazide at home for her blood pressure and takes Lasix 40 mg daily.  She uses diltiazem only as needed.  Patient was last seen by Dr. Dulce Sellar in May 2024 at which time she was doing well.  Pacemaker care was deferred to [facility] EP service Dr. Garner Nash.  Patient has been noticing worsening dyspnea on exertion in the past few weeks.  In the past few days, she has not been able to get her Ambien and Xanax for which she uses for sleep.  She presented to [facility] complaining of weakness in the malaise for 3 days.  Troponin I [facility] Salvia was negative.  BNP elevated at 2660.  Urine drug screen positive for THC, methadone and benzodiazepine.  Sodium and potassium were low.  K2.5.  INR supratherapeutic at 6.4.  Patient was given vitamin K.  CT of the chest shows dissection from isthmus to descending thoracic aorta was aneurysm in ascending aorta measuring at 4.2 cm.  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  After she arrived at [facility], she was placed on IV diltiazem for blood pressure control.  She is also getting potassium replaced.  Vascular  surgery was consulted and recommend manage conservatively and repeat study in 48 hours.  Cardiology service consulted for elevated BNP.  Despite her complaint of shortness of breath in the past few weeks, on physical exam, she appears to be euvolemic.  Plan to repeat echocardiogram to reassess ejection fraction.   Past Medical History:  Diagnosis Date   Acute on chronic HFrEF (heart failure with reduced ejection fraction) (HCC) 08/26/2021   AKI (acute kidney  injury) (HCC) 09/30/2021   Anxiety    Aortic aneurysm (HCC)    Atrial arrhythmia    With prior history of atrial flutter ablation   Bradycardia    s/p pacemaker implantation   Chronic anticoagulation 08/26/2021   Chronic combined systolic and diastolic CHF (congestive heart failure) (HCC) 09/30/2021   CKD (chronic kidney disease) stage 3, GFR 30-59 ml/min (HCC) 08/26/2021   Closed right hip fracture (HCC) 08/11/2022   CONGESTIVE HEART FAILURE 07/31/2010   Qualifier: Diagnosis of  By: Graciela Husbands, MD, Susie Cassette    Coronary atherosclerosis 06/08/2014   Last Assessment & Plan: Formatting of this note might be different from the original. Complains of consistent and persistent shortness of breath.  She has been experiencing some bilateral arm pain.  She is certain she did not experience that discomfort but that was not her anginal equivalent when she had a myocardial infarction several years back.  With that said, symptoms do improve with the subl   DNR (do not resuscitate) 09/30/2021   Dysphagia 03/10/2023   Essential hypertension 07/31/2010   Qualifier: Diagnosis of   By: Graciela Husbands, MD, Susie Cassette      Fall at home, initial encounter 08/11/2022   GERD (gastroesophageal reflux disease)    H/O mitral valve replacement with mechanical valve 10/14/2021   Hypokalemia 10/17/2021   Hypothyroidism    Hypoxemia 08/26/2021   Influenza A 08/26/2021   Left ventricular dysfunction 06/08/2014   Long Q-T syndrome    Malnutrition of moderate degree 08/28/2021   Mixed hyperlipidemia 12/12/2019   Myocardial infarct (HCC) 11/20/2011   Stent placement for 100% blockage of LAD   Pacemaker reprogramming/check 10/19/2008   Last Assessment & Plan: Formatting of this note might be different from the original. Normal Device Function ----- NEARING Elective Replacement ---- Presents in sinus rhythm. No Permanent Device Reprogramming Required -  Follow up 3-4 months, remote monitoring on a quarterly  basis, as per protocol.  Encouraged follow up with PCP and Cardiologist.   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 Excell Seltzer and Dr. Laneta Simmers   Senile nuclear sclerosis 10/17/2015   Severe aortic stenosis 08/26/2021   SHORTNESS OF BREATH 07/31/2010   Qualifier: Diagnosis of   By: Graciela Husbands, MD, Susie Cassette      Supratherapeutic INR 08/11/2022   Thoracic aortic aneurysm 05/13/2021   Transaminitis    TRICUSPID REGURGITATION 12/23/2010   Qualifier: Diagnosis of  By: Graciela Husbands, MD, [facility], Ty Hilts     Past Surgical History:  Procedure Laterality Date   APPENDECTOMY     CARDIAC ELECTROPHYSIOLOGY STUDY AND ABLATION     Stent and pacemaker placement   CHOLECYSTECTOMY     CORONARY ANGIOPLASTY WITH STENT PLACEMENT Left 09/13/2015   Dr. Josefa Half at [facility], LAD  stent   INTRAOPERATIVE TRANSTHORACIC ECHOCARDIOGRAM N/A 10/14/2021   Procedure: INTRAOPERATIVE TRANSTHORACIC ECHOCARDIOGRAM;  Surgeon: Tonny Bollman, MD;  Location: [facility] OR;  Service: Open Heart Surgery;  Laterality: N/A;   MITRAL VALVE REPLACEMENT  2001   mechanical valve   OOPHORECTOMY     PACEMAKER INSERTION  11/20/2007   Dual chamber pacemaker implantation with rapid ventricular pacing   RIGHT/LEFT HEART CATH AND CORONARY ANGIOGRAPHY N/A 10/03/2021   Procedure: RIGHT/LEFT HEART CATH AND CORONARY ANGIOGRAPHY;  Surgeon: Tonny Bollman, MD;  Location: [facility] INVASIVE CV LAB;  Service: Cardiovascular;  Laterality: N/A;   SALPINGECTOMY Bilateral    TONSILLECTOMY     TOTAL HIP ARTHROPLASTY Right 08/12/2022   Procedure: RIGHT TOTAL HIP ARTHROPLASTY;  Surgeon: Joen Laura, MD;  Location: [facility] OR;  Service: Orthopedics;  Laterality:  Right;   TRANSCATHETER AORTIC VALVE REPLACEMENT, TRANSFEMORAL N/A 10/14/2021   Procedure: TRANSCATHETER AORTIC VALVE REPLACEMENT, TRANSFEMORAL;  Surgeon: Tonny Bollman, MD;  Location: [facility] OR;  Service: Open Heart Surgery;  Laterality: N/A;     Home Medications:  Prior to Admission medications   Medication Sig Start Date End Date Taking? Authorizing Provider  Acetaminophen (TYLENOL PO) Take 1 tablet by mouth as needed (pain).    [provider]  albuterol (VENTOLIN HFA) 108 (90 Base) MCG/ACT inhaler Inhale 2 puffs into the lungs as needed for shortness of breath. 03/21/14   [provider]  alprazolam Prudy Feeler) 2 MG tablet Take 0.5 tablets (1 mg total) by mouth 2 (two) times daily as needed for anxiety. 08/18/22   Meredeth Ide, MD  atorvastatin (LIPITOR) 40 MG tablet Take 1 tablet (40 mg total) by mouth 2 (two) times a week. 03/18/23   Baldo Daub, MD  cyclobenzaprine (FLEXERIL) 10 MG tablet Take 10 mg by mouth daily as needed for muscle spasms. 03/02/23   [provider]  diltiazem (DILACOR XR) 240 MG 24 hr capsule Take 240 mg by mouth daily as needed (tachycardia).    [provider]  docusate sodium (COLACE) 100 MG capsule Take 1 capsule (100 mg total) by mouth 2 (two) times daily. 10/06/21   Swayze, Ava, DO  furosemide (LASIX) 40 MG tablet Take 20 mg by mouth 2 (two) times daily. 03/03/23   [provider]  hydrochlorothiazide (HYDRODIURIL) 25 MG tablet Take 25 mg by mouth daily. 02/04/23   [provider]  KLOR-CON M20 20 MEQ tablet TAKE 3 TABLETS (60 MEQ TOTAL) BY MOUTH SEE ADMIN INSTRUCTIONS. CRUSH AND MIX 60 MEQ WITH APPLESAUCE AND EAT ONCE A DAY 04/28/23   Baldo Daub, MD  levothyroxine (SYNTHROID) 75 MCG tablet Take 75 mcg by mouth daily before breakfast.    [provider]  lidocaine (LIDODERM) 5 % Place 1 patch onto the skin daily. Remove & Discard patch within 12 hours or as directed by MD 08/18/22   Meredeth Ide, MD   Menthol, Topical Analgesic, 2 % GEL Apply 1 Application topically as needed (pain).    [provider]  nitroGLYCERIN (NITROSTAT) 0.4 MG SL tablet Place 1 tablet (0.4 mg total) under the tongue every 5 (five) minutes as needed for chest pain. 05/18/22   Baldo Daub, MD  pantoprazole (PROTONIX) 40 MG tablet TAKE 1 TABLET BY MOUTH EVERY DAY 03/09/23   Baldo Daub, MD  warfarin (COUMADIN) 5 MG tablet Take 7.5 mg by mouth every evening.    [provider]  zolpidem (AMBIEN) 10 MG tablet Take 10 mg by mouth at bedtime as  needed for sleep. 11/27/11   [provider]  furosemide (LASIX) 20 MG tablet Take 40 mg by mouth 2 (two) times daily. 09/21/21   [provider]    Inpatient Medications: Scheduled Meds:  ALPRAZolam  2 mg Oral BID   insulin aspart  0-9 Units Subcutaneous Q4H   levothyroxine  75 mcg Oral Q0600   pantoprazole  40 mg Oral Daily   Continuous Infusions:  diltiazem (CARDIZEM) infusion 5 mg/hr (09/12/23 1100)   potassium chloride 10 mEq (09/12/23 1151)   PRN Meds: acetaminophen, albuterol, docusate sodium, mouth rinse, oxyCODONE, polyethylene glycol  Allergies:    Allergies  Allergen Reactions   Atenolol Other (See Comments)    HR and pulse dropped   Metoprolol Succinate [Metoprolol] Other (See Comments)    HR and pulse dropped   Baclofen Anxiety and Other (See Comments)    Makes the patient feel anxious the day after taking this. "Makes me jerk"    Latex Rash    Social History:   Social History   Socioeconomic History   Marital status: Single    Spouse name: Not on file   Number of children: Not on file   Years of education: Not on file   Highest education level: Not on file  Occupational History   Not on file  Tobacco Use   Smoking status: Never   Smokeless tobacco: Never  Substance and Sexual Activity   Alcohol use: Never   Drug use: Never   Sexual activity: Not on file  Other Topics Concern   Not on file  Social  History Narrative   Not on file   Social Determinants of Health   Financial Resource Strain: Low Risk  (03/08/2021)   Received from [facility]   Overall Financial Resource Strain (CARDIA)    Difficulty of Paying Living Expenses: Not hard at all  Food Insecurity: No Food Insecurity (03/08/2021)   Received from [facility], [facility]   Hunger Vital Sign    Worried About Running Out of Food in the Last Year: Never true    Ran Out of Food in the Last Year: Never true  Transportation Needs: No Transportation Needs (03/08/2021)   Received from [facility], [facility]   [facility] - Transportation    Lack of Transportation (Medical): No    Lack of Transportation (Non-Medical): No  Physical Activity: Not on file  Stress: Not on file  Social Connections: Not on file  Intimate Partner Violence: Not on file    Family History:    Family History  Problem Relation Age of Onset   Leukemia Mother    Stroke Mother    Heart disease Father    Stroke Maternal Grandmother    Heart attack Paternal Grandmother      ROS:  Please see the history of present illness.   All other ROS reviewed and negative.     Physical Exam/Data:   Vitals:   09/12/23 1015 09/12/23 1030 09/12/23 1100 09/12/23 1152  BP: 115/69 106/80 124/70   Pulse: 68 65 65 65  Resp: (!) 21 19 (!) 28 17  Temp:    98.1 F (36.7 C)  TempSrc:    Axillary  SpO2: 91% (!) 89% 95% 94%    Intake/Output Summary (Last 24 hours) at 09/12/2023 1156 Last data filed at 09/12/2023 1100 Gross per 24 hour  Intake 100.7 ml  Output 100 ml  Net 0.7 ml  03/17/2023    1:09 PM 08/15/2022    5:00 AM 08/14/2022    5:00 AM  Last 3 Weights  Weight (lbs) 120 lb 3.2 oz 117 lb 15.1 oz 129 lb 10.1 oz  Weight (kg) 54.522 kg 53.5 kg 58.8 kg     There is no height or weight on file to calculate BMI.  General:  Well nourished, well developed, in no acute distress HEENT: normal Neck: no JVD Vascular: No  carotid bruits; Distal pulses 2+ bilaterally Cardiac:  normal S1, S2; RRR; loud heart murmur at the apex with mechanical click Lungs:  clear to auscultation bilaterally, no wheezing, rhonchi or rales  Abd: soft, nontender, no hepatomegaly  Ext: no edema Musculoskeletal:  No deformities, BUE and BLE strength normal and equal Skin: warm and dry  Neuro:  CNs 2-12 intact, no focal abnormalities noted Psych:  Normal affect   EKG:  The EKG was personally reviewed and demonstrates: No EKG available Telemetry:  Telemetry was personally reviewed and demonstrates: Paced rhythm  Relevant CV Studies:  Echo 11/23/2022  1. Sigmoid septum, GLS -12.3. Left ventricular ejection fraction, by  estimation, is 55 to 60%. The left ventricle has normal function. The left  ventricle has no regional wall motion abnormalities. Left ventricular  diastolic parameters are consistent  with Grade II diastolic dysfunction (pseudonormalization).   2. Right ventricular systolic function is normal. The right ventricular  size is normal. There is mildly elevated pulmonary artery systolic  pressure.   3. Left atrial size was severely dilated.   4. MVR 20y ago, type, size unknown. The mitral valve has been  repaired/replaced. No evidence of mitral valve regurgitation. No evidence  of mitral stenosis. The mean mitral valve gradient is 3.0 mmHg. Echo  findings are consistent with normal structure  and function of the mitral valve prosthesis.   5. Tricuspid valve regurgitation is moderate.   6. The aortic valve has been repaired/replaced. Aortic valve  regurgitation is not visualized. No aortic stenosis is present. There is a  23 mm Sapien prosthetic (TAVR) valve present in the aortic position.  Procedure Date: 10/14/2021. Echo findings are  consistent with normal structure and function of the aortic valve  prosthesis. Aortic valve area, by VTI measures 1.81 cm. Aortic valve mean  gradient measures 8.0 mmHg.   7.  Aneurysm of the ascending aorta, measuring 41 mm.   8. The inferior vena cava is normal in size with greater than 50%  respiratory variability, suggesting right atrial pressure of 3 mmHg.    Laboratory Data:  High Sensitivity Troponin:   Recent Labs  Lab 09/12/23 0803  TROPONINIHS 24*     Chemistry Recent Labs  Lab 09/12/23 0803  NA 133*  K 2.7*  CL 96*  CO2 25  GLUCOSE 109*  BUN 11  CREATININE 1.21*  CALCIUM 8.9  MG 2.3  GFRNONAA 46*  ANIONGAP 12    Recent Labs  Lab 09/12/23 0803  PROT 6.9  ALBUMIN 3.9  AST 25  ALT 14  ALKPHOS 71  BILITOT 1.5*   Lipids No results for input(s): "CHOL", "TRIG", "HDL", "LABVLDL", "LDLCALC", "CHOLHDL" in the last 168 hours.  Hematology Recent Labs  Lab 09/12/23 0803  WBC 6.7  RBC 4.64  HGB 13.2  HCT 39.5  MCV 85.1  MCH 28.4  MCHC 33.4  RDW 15.5  PLT 201   Thyroid No results for input(s): "TSH", "FREET4" in the last 168 hours.  BNPNo results for input(s): "BNP", "PROBNP" in the last  168 hours.  DDimer No results for input(s): "DDIMER" in the last 168 hours.   Radiology/Studies:  DG Chest Port 1 View  Result Date: 09/12/2023 CLINICAL DATA:  Dyspnea.  History of aortic dissection. EXAM: PORTABLE CHEST 1 VIEW COMPARISON:  08/11/2022 FINDINGS: There is a left chest wall pacer device with leads in the right atrial appendage and right ventricle. Previous median sternotomy and mitral valve replacement. Status post TAVR. Aortic atherosclerosis and cardiac enlargement. Lungs are hyperinflated. No pleural fluid, interstitial edema or airspace disease. Visualized osseous structures appear unremarkable. IMPRESSION: 1. No acute findings. 2. Cardiomegaly. 3. Hyperinflation. Electronically Signed   By: Signa Kell M.D.   On: 09/12/2023 08:19     Assessment and Plan:   Elevated BNP  -Patient has a history of systolic heart failure but EF of 35% in 2022, however improved over time to 50 to 55% by February 2024 on the last  echocardiogram.  She has been having increasing shortness of breath for the past few weeks.  BNP at [facility] was high at 2660.  She does not appears to be significantly volume overloaded based on my physical exam.  Chest x-ray also shows no acute finding.  She has been compliant with 40 mg daily of Lasix at home.  I will repeat lab work.  Type B aortic dissection: Incidental finding on CT image at [facility].  Seen by vascular surgery here who recommended conservative management and reimage in 48 hours.  On IV diltiazem for tight blood pressure control  Thoracic aortic aneurysm: 4.2 cm on CT image at [facility].  Previously 4.1 cm on echocardiogram in February 2024.  History of mechanical mitral valve in 2002 at [facility]: Stable on last echocardiogram in February 2024, pending repeat echocardiogram  -INR supratherapeutic at [facility], required vitamin K reversal.  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  Pending repeat echocardiogram  History of Medtronic pacemaker in 2009: Telemetry shows she is almost 100% paced.  Unfortunately she has not seen by her [facility] EP Dr. Jeralene Huff since October 2022.  Based on the previous note by Dr. Garner Nash, her last device interrogation in March 2022 indicated the device longevity was 26 months from that time.  She has not had any device interrogation since.  Given this information, patient's device is likely already reached ERI.  Fortunately, she is still being paced.  Will have Medtronic rep to interrogate her device tomorrow morning.  CAD: Cardiac catheterization prior to her TAVR procedure in December 2022 showed minimal CAD with patent mid LAD stent.  Patient denies any recent chest pain.  PAF: Paced rhythm on telemetry.  On Coumadin for mechanical mitral valve.  INR supratherapeutic.  Hypertension: On hydrochlorothiazide at home.  Currently on IV diltiazem for blood pressure control  given type B aortic dissection.  Will need tight blood pressure control with systolic blood pressure around 110.  Hyperlipidemia  Hypokalemia: Receiving IV potassium.  Chronic history of hypokalemia.   Risk Assessment/Risk Scores:        New York Heart Association (NYHA) Functional Class NYHA Class III  CHA2DS2-VASc Score = 6   This indicates a 9.7% annual risk of stroke. The patient's score is based upon: CHF History: 1 HTN History: 1 Diabetes History: 0 Stroke History: 0 Vascular Disease History: 1 Age Score: 2 Gender Score: 1         For questions or updates, please contact [facility] Please consult [email or web address] for contact  info under    Signed, Azalee Course, [place]  09/12/2023 11:56 AM  Personally seen and examined. Agree with above.  77 year old with newly discovered type B aortic dissection.  IV diltiazem is being utilized for blood pressure control.  No abdominal pain.  Vascular surgery Dr. Karin Lieu has seen her.  Continue with tight blood pressure control.  Conservative management.  Mechanical mitral valve-2002 TAVR valve 2022 Medtronic pacemaker-every beat is ventricular paced.  Having device interrogated tomorrow.  BNP 2600 Has had some shortness of breath over the past few weeks mostly associated with back pain.  Now that her pain is under better control she does feel that her breathing is better as well. I will go ahead and restart her Lasix 40 mg daily as she takes at home.  Replating potassium.  Coronary disease is stable with patent mid LAD stent  Very pleasant, normal respiratory effort currently.  Sharp S1 click  CRITICAL CARE Performed by: Donato Schultz   Total critical care time: 35 minutes  Critical care time was exclusive of separately billable procedures and treating other patients.  Critical care was necessary to treat or prevent imminent or life-threatening deterioration.  Critical care was time spent personally by me on the  following activities: development of treatment plan with patient and/or surrogate as well as nursing, discussions with consultants, evaluation of patient's response to treatment, examination of patient, obtaining history from patient or surrogate, ordering and performing treatments and interventions, ordering and review of laboratory studies, ordering and review of radiographic studies, pulse oximetry and re-evaluation of patient's condition.   Donato Schultz, MD

## 2023-09-12 NOTE — Progress Notes (Signed)
Patient states that she has "about three thousand dollars in her purse".  Day shift nurse [place] informed during hand off.

## 2023-09-12 NOTE — H&P (Addendum)
NAME:  [name] [name], MRN:  [id number], DOB:  1946/09/18, LOS: 0 ADMISSION DATE:  09/12/2023, CONSULTATION DATE:  11/24 REFERRING MD:  Dr. Hyacinth Meeker, [facility] , CHIEF COMPLAINT:  Aortic dissection   History of Present Illness:  77 year old female with past medical history as below, which is significant for HFrEF, aortic aneurysm, pacemaker, CKD 3, hypothyroidism, long QT syndrome, coronary artery disease, paroxysmal atrial fibrillation, rheumatic heart disease, and history of TAVR.  She was in her usual state of health until approximately 11/21 when she began to experience weakness and general malaise.  Symptoms progressed to include shortness of breath which caused her to present to [facility] emergency department on 11/23.  Complained of upper back pain as well upon arrival to the emergency department.  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  Aneurysmal dilation of the ascending aorta measuring up to 4.2 cm.  She was started on diltiazem infusion for heart rate and blood pressure control.  Vascular surgery at [facility] was consulted for possible intervention.  She was transferred to the cardiothoracic ICU with PCCM excepting.  Pertinent  Medical History   has a past medical history of Acute on chronic HFrEF (heart failure with reduced ejection fraction) (HCC) (08/26/2021), AKI (acute kidney injury) (HCC) (09/30/2021), Anxiety, Aortic aneurysm (HCC), Atrial arrhythmia, Bradycardia, Chronic anticoagulation (08/26/2021), Chronic combined systolic and diastolic CHF (congestive heart failure) (HCC) (09/30/2021), CKD (chronic kidney disease) stage 3, GFR 30-59 ml/min (HCC) (08/26/2021), Closed right hip fracture (HCC) (08/11/2022), CONGESTIVE HEART FAILURE (07/31/2010), Coronary atherosclerosis (06/08/2014), DNR (do not resuscitate) (09/30/2021),  Dysphagia (03/10/2023), Essential hypertension (07/31/2010), Fall at home, initial encounter (08/11/2022), GERD (gastroesophageal reflux disease), 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), Hypokalemia (10/17/2021), Hypothyroidism, Hypoxemia (08/26/2021), Influenza A (08/26/2021), Left ventricular dysfunction (06/08/2014), Long Q-T syndrome, Malnutrition of moderate degree (08/28/2021), Mixed hyperlipidemia (12/12/2019), Myocardial infarct (HCC) (11/20/2011), Pacemaker reprogramming/check (10/19/2008), PAF (paroxysmal atrial fibrillation) (HCC) (08/11/2022), Presence of permanent cardiac pacemaker, Rheumatic heart disease, 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   Significant Hospital Events: Including procedures, antibiotic start and stop dates in addition to other pertinent events   11/24 admit for aortic dissection  Interim History / Subjective:    Objective   There were no vitals taken for this visit.       No intake or output data in the 24 hours ending 09/12/23 0629 There were no vitals filed for this visit.  Examination: General: Elderly appearing female in NAD HENT: Plain/AT, PERRL, no JVD Lungs: Clear bilateral breath sounds Cardiovascular: Paced at rate 65, no MRG Abdomen: Soft, Non-tender, non-distended Extremities: No acute deformity or ROM limitation. Peripheral pulses intact.  Neuro: Alert, oriented, non-focal  Resolved Hospital Problem list     Assessment & Plan:   Aortic dissection - Consult vascular surgery first thing in AM - Continue diltiazem to keep SBP < 120 mm Hg and HR < 70 - Neurovascular checks  Supratherapeutic INR: 6.1 upon presentation. Warfarin for atrial fibrillation -  Repeat INR pending - Correct as indicated  Chronic HFrEF CAD S/p TAVR - holding home furosemide, diltiazem, hydrochlorothiazide - Telemetry monitoring  Hyperglycemia - CBG monitoring and SSI   Hypokalemia - repleted in the ED - Repeat BMP  Hypothyroid - Continue synthroid  Back pain: she tells me this is a chronic pain. No acute pain related to her presentation currently - PRN hydrocodone.   Substance abuse: UDS positive for marijuana, methodone, opiates. Admitted to [facility] gummy in ED. Denies any opoid use, questioning if she took the wrong bottle.  - Supportive care - Consider social work consult as we learn more. Reportedly the opioids may belong to her son?  Best Practice (right click and "Reselect all SmartList Selections" daily)   Diet/type: NPO DVT prophylaxis:    Pressure ulcer(s): not present on admission  GI prophylaxis: PPI Lines: N/A Foley:  N/A Code Status:  full code Last date of multidisciplinary goals of care discussion [ ]   Labs   CBC: No results for input(s): "WBC", "NEUTROABS", "HGB", "HCT", "MCV", "PLT" in the last 168 hours.  Basic Metabolic Panel: No results for input(s): "NA", "K", "CL", "CO2", "GLUCOSE", "BUN", "CREATININE", "CALCIUM", "MG", "PHOS" in the last 168 hours. GFR: CrCl cannot be calculated (Patient's most recent lab result is older than the maximum 21 days allowed.). No results for input(s): "PROCALCITON", "WBC", "LATICACIDVEN" in the last 168 hours.  Liver Function Tests: No results for input(s): "AST", "ALT", "ALKPHOS", "BILITOT", "PROT", "ALBUMIN" in the last 168 hours. No results for input(s): "LIPASE", "AMYLASE" in the last 168 hours. No results for input(s): "AMMONIA" in the last 168 hours.  ABG    Component Value Date/Time   PHART 7.515 (H) 10/14/2021 0802   PCO2ART 41.4 10/14/2021 0802   PO2ART 154 (H) 10/14/2021 0802   HCO3 33.4 (H) 10/14/2021 0802   TCO2 32 10/14/2021 1025   O2SAT 100.0 10/14/2021 0802      Coagulation Profile: No results for input(s): "INR", "PROTIME" in the last 168 hours.  Cardiac Enzymes: No results for input(s): "CKTOTAL", "CKMB", "CKMBINDEX", "TROPONINI" in the last 168 hours.  HbA1C: No results found for: "HGBA1C"  CBG: No results for input(s): "GLUCAP" in the last 168 hours.  Review of Systems:   Bolds are positive  Constitutional: weight loss, gain, night sweats, Fevers, chills, fatigue .  HEENT: headaches, Sore throat, sneezing, nasal congestion, post nasal drip, Difficulty swallowing, Tooth/dental problems, visual complaints visual changes, ear ache. CV:  chest pain, radiates:,Orthopnea, PND, swelling in lower extremities, dizziness, palpitations, syncope.  GI  heartburn, indigestion, abdominal pain, nausea, vomiting, diarrhea, change in bowel habits, loss of appetite, bloody stools.  Resp: cough, productive: , hemoptysis, dyspnea, chest pain, pleuritic.  Skin: rash or itching or icterus GU: dysuria, change in color of urine, urgency or frequency. flank pain, hematuria  MS: joint pain or swelling. decreased range of motion back pain chronic Psych: change in mood or affect. depression or anxiety.  Neuro: difficulty with speech, weakness, numbness, ataxia    Past Medical History:  She,  has a past medical history of Acute on chronic HFrEF (heart failure with reduced ejection fraction) (HCC) (08/26/2021), AKI (acute kidney injury) (HCC) (09/30/2021), Anxiety, Aortic aneurysm (HCC), Atrial arrhythmia, Bradycardia, Chronic anticoagulation (08/26/2021), Chronic combined systolic and diastolic CHF (congestive heart failure) (HCC) (09/30/2021), CKD (chronic kidney disease) stage 3, GFR 30-59 ml/min (HCC) (08/26/2021), Closed right hip fracture (HCC) (08/11/2022), CONGESTIVE HEART FAILURE (07/31/2010), Coronary atherosclerosis (06/08/2014), DNR (do not resuscitate) (09/30/2021), Dysphagia (03/10/2023), Essential hypertension (07/31/2010), Fall at home, initial encounter  (08/11/2022), GERD (gastroesophageal reflux disease), 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), Hypokalemia (10/17/2021), Hypothyroidism, Hypoxemia (08/26/2021), Influenza A (08/26/2021), Left ventricular dysfunction (06/08/2014), Long Q-T syndrome, Malnutrition of moderate degree (08/28/2021), Mixed hyperlipidemia (12/12/2019),  Myocardial infarct (HCC) (11/20/2011), Pacemaker reprogramming/check (10/19/2008), PAF (paroxysmal atrial fibrillation) (HCC) (08/11/2022), Presence of permanent cardiac pacemaker, Rheumatic heart disease, 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   Surgical History:   Past Surgical History:  Procedure Laterality Date   APPENDECTOMY     CARDIAC ELECTROPHYSIOLOGY STUDY AND ABLATION     Stent and pacemaker placement   CHOLECYSTECTOMY     CORONARY ANGIOPLASTY WITH STENT PLACEMENT Left 09/13/2015   Dr. Josefa Half at [facility], LAD stent   INTRAOPERATIVE TRANSTHORACIC ECHOCARDIOGRAM N/A 10/14/2021   Procedure: INTRAOPERATIVE TRANSTHORACIC ECHOCARDIOGRAM;  Surgeon: Tonny Bollman, MD;  [facility];  Service: Open Heart Surgery;  Laterality: N/A;   MITRAL VALVE REPLACEMENT  2001   mechanical valve   OOPHORECTOMY     PACEMAKER INSERTION  11/20/2007   Dual chamber pacemaker implantation with rapid ventricular pacing   RIGHT/LEFT HEART CATH AND CORONARY ANGIOGRAPHY N/A 10/03/2021   Procedure: RIGHT/LEFT HEART CATH AND CORONARY ANGIOGRAPHY;  Surgeon: Tonny Bollman, MD;  Location: [facility] INVASIVE CV LAB;  Service: Cardiovascular;  Laterality: N/A;   SALPINGECTOMY Bilateral    TONSILLECTOMY     TOTAL HIP ARTHROPLASTY Right 08/12/2022   Procedure: RIGHT TOTAL HIP ARTHROPLASTY;  Surgeon: Joen Laura, MD;  Location: [facility] OR;  Service: Orthopedics;  Laterality: Right;   TRANSCATHETER AORTIC VALVE REPLACEMENT, TRANSFEMORAL N/A 10/14/2021   Procedure: TRANSCATHETER AORTIC VALVE REPLACEMENT, TRANSFEMORAL;  Surgeon: Tonny Bollman, MD;  Location: [facility] OR;  Service: Open Heart Surgery;  Laterality: N/A;     Social History:   reports that she has never smoked. She has never used smokeless tobacco. She reports that she does not drink alcohol and does not use drugs.   Family History:  Her family history includes Heart attack in her paternal grandmother; Heart disease in her father; Leukemia in her mother; Stroke in her maternal grandmother and mother.   Allergies Allergies  Allergen Reactions   Atenolol Other (See Comments)    HR and pulse dropped   Metoprolol Succinate [Metoprolol] Other (See Comments)    HR and pulse dropped   Baclofen Anxiety and Other (See Comments)    Makes the patient feel anxious the day after taking this. "Makes me jerk"    Latex Rash     Home Medications  Prior to Admission medications   Medication Sig Start Date End Date Taking? Authorizing Provider  Acetaminophen (TYLENOL PO) Take 1 tablet by mouth as needed (pain).    [provider]  albuterol (VENTOLIN HFA) 108 (90 Base) MCG/ACT inhaler Inhale 2 puffs into the lungs as needed for shortness of breath. 03/21/14   [provider]  alprazolam Prudy Feeler) 2 MG tablet Take 0.5 tablets (1 mg total) by mouth 2 (two) times daily as needed for anxiety. 08/18/22   Meredeth Ide, MD  atorvastatin (LIPITOR) 40 MG tablet Take 1 tablet (40 mg total) by mouth 2 (two) times a week. 03/18/23   Baldo Daub, MD  cyclobenzaprine (FLEXERIL) 10 MG tablet Take 10 mg by mouth daily as needed for muscle spasms. 03/02/23   [provider]  diltiazem (DILACOR XR) 240 MG 24 hr capsule Take 240 mg by mouth daily as needed (tachycardia).    [provider]  docusate sodium (COLACE) 100 MG capsule Take  1 capsule (100 mg total) by mouth 2 (two) times daily. 10/06/21  Swayze, Ava, DO  furosemide (LASIX) 40 MG tablet Take 20 mg by mouth 2 (two) times daily. 03/03/23   [provider]  hydrochlorothiazide (HYDRODIURIL) 25 MG tablet Take 25 mg by mouth daily. 02/04/23   [provider]  KLOR-CON M20 20 MEQ tablet TAKE 3 TABLETS (60 MEQ TOTAL) BY MOUTH SEE ADMIN INSTRUCTIONS. CRUSH AND MIX 60 MEQ WITH APPLESAUCE AND EAT ONCE A DAY 04/28/23   Baldo Daub, MD  levothyroxine (SYNTHROID) 75 MCG tablet Take 75 mcg by mouth daily before breakfast.    [provider]  lidocaine (LIDODERM) 5 % Place 1 patch onto the skin daily. Remove & Discard patch within 12 hours or as directed by MD 08/18/22   Meredeth Ide, MD  Menthol, Topical Analgesic, 2 % GEL Apply 1 Application topically as needed (pain).    [provider]  nitroGLYCERIN (NITROSTAT) 0.4 MG SL tablet Place 1 tablet (0.4 mg total) under the tongue every 5 (five) minutes as needed for chest pain. 05/18/22   Baldo Daub, MD  pantoprazole (PROTONIX) 40 MG tablet TAKE 1 TABLET BY MOUTH EVERY DAY 03/09/23   Baldo Daub, MD  warfarin (COUMADIN) 5 MG tablet Take 7.5 mg by mouth every evening.    [provider]  zolpidem (AMBIEN) 10 MG tablet Take 10 mg by mouth at bedtime as needed for sleep. 11/27/11   [provider]  furosemide (LASIX) 20 MG tablet Take 40 mg by mouth 2 (two) times daily. 09/21/21   [provider]     Critical care time: 38 min     Joneen Roach, AGACNP-BC [facility]  See Amion for personal pager PCCM on call pager [phone] until 7pm. Please call Elink 7p-7a. [phone]  09/12/2023 7:08 AM

## 2023-09-12 NOTE — Consult Note (Signed)
Hospital Consult    Reason for Consult: TBAD Requesting Physician: CCM MRN #:  [id number]  History of Present Illness: This is a 76 y.o. 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  She was transferred to [facility] for further care.  On exam this morning Aminah was doing well.  She states that she has significant anxiety and has been off of Ativan for a number of days.  She noted shortness of breath yesterday, and has continued to struggle with a little bit albeit it has improved.  She has chronic back pain which has been present since 2019.  This has not worsened over the last few days.  She denies abdominal pain, chest pain.    Past Medical History:  Diagnosis Date   Acute on chronic HFrEF (heart failure with reduced ejection fraction) (HCC) 08/26/2021   AKI (acute kidney injury) (HCC) 09/30/2021   Anxiety    Aortic aneurysm (HCC)    Atrial arrhythmia    With prior history of atrial flutter ablation   Bradycardia    s/p pacemaker implantation   Chronic anticoagulation 08/26/2021   Chronic combined systolic and diastolic CHF (congestive heart failure) (HCC) 09/30/2021   CKD (chronic kidney disease) stage 3, GFR 30-59 ml/min (HCC) 08/26/2021   Closed right hip fracture (HCC) 08/11/2022   CONGESTIVE HEART FAILURE 07/31/2010   Qualifier: Diagnosis of  By: Graciela Husbands, MD, Susie Cassette    Coronary atherosclerosis 06/08/2014   Last Assessment & Plan: Formatting of this note might be different from the original. Complains of consistent and persistent shortness of breath.  She has been experiencing some bilateral arm pain.  She is certain she did not experience that discomfort but that was not her anginal equivalent when she had a myocardial infarction several years back.  With that said,  symptoms do improve with the subl   DNR (do not resuscitate) 09/30/2021   Dysphagia 03/10/2023   Essential hypertension 07/31/2010   Qualifier: Diagnosis of   By: Graciela Husbands, MD, Susie Cassette      Fall at home, initial encounter 08/11/2022   GERD (gastroesophageal reflux disease)    H/O mitral valve replacement with mechanical valve 10/14/2021   Hypokalemia 10/17/2021   Hypothyroidism    Hypoxemia 08/26/2021   Influenza A 08/26/2021   Left ventricular dysfunction 06/08/2014   Long Q-T syndrome    Malnutrition of moderate degree 08/28/2021   Mixed hyperlipidemia 12/12/2019   Myocardial infarct (HCC) 11/20/2011   Stent placement for 100% blockage of LAD   Pacemaker reprogramming/check 10/19/2008   Last Assessment & Plan: Formatting of this note might be different from the original. Normal Device Function ----- NEARING Elective Replacement ---- Presents in sinus rhythm. No Permanent Device Reprogramming Required -  Follow up 3-4 months, remote monitoring on a quarterly basis, as per protocol.  Encouraged follow up with PCP and Cardiologist.   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 Excell Seltzer and Dr. Laneta Simmers   Senile nuclear sclerosis 10/17/2015   Severe aortic stenosis 08/26/2021   SHORTNESS  OF BREATH 07/31/2010   Qualifier: Diagnosis of   By: Graciela Husbands, MD, Susie Cassette      Supratherapeutic INR 08/11/2022   Thoracic aortic aneurysm 05/13/2021   Transaminitis    TRICUSPID REGURGITATION 12/23/2010   Qualifier: Diagnosis of  By: Graciela Husbands, MD, [facility], Ty Hilts     Past Surgical History:  Procedure Laterality Date   APPENDECTOMY     CARDIAC ELECTROPHYSIOLOGY STUDY AND ABLATION      Stent and pacemaker placement   CHOLECYSTECTOMY     CORONARY ANGIOPLASTY WITH STENT PLACEMENT Left 09/13/2015   Dr. Josefa Half at [facility], LAD stent   INTRAOPERATIVE TRANSTHORACIC ECHOCARDIOGRAM N/A 10/14/2021   Procedure: INTRAOPERATIVE TRANSTHORACIC ECHOCARDIOGRAM;  Surgeon: Tonny Bollman, MD;  Location: [facility] OR;  Service: Open Heart Surgery;  Laterality: N/A;   MITRAL VALVE REPLACEMENT  2001   mechanical valve   OOPHORECTOMY     PACEMAKER INSERTION  11/20/2007   Dual chamber pacemaker implantation with rapid ventricular pacing   RIGHT/LEFT HEART CATH AND CORONARY ANGIOGRAPHY N/A 10/03/2021   Procedure: RIGHT/LEFT HEART CATH AND CORONARY ANGIOGRAPHY;  Surgeon: Tonny Bollman, MD;  Location: [facility] INVASIVE CV LAB;  Service: Cardiovascular;  Laterality: N/A;   SALPINGECTOMY Bilateral    TONSILLECTOMY     TOTAL HIP ARTHROPLASTY Right 08/12/2022   Procedure: RIGHT TOTAL HIP ARTHROPLASTY;  Surgeon: Joen Laura, MD;  Location: [facility] OR;  Service: Orthopedics;  Laterality: Right;   TRANSCATHETER AORTIC VALVE REPLACEMENT, TRANSFEMORAL N/A 10/14/2021   Procedure: TRANSCATHETER AORTIC VALVE REPLACEMENT, TRANSFEMORAL;  Surgeon: Tonny Bollman, MD;  Location: [facility] OR;  Service: Open Heart Surgery;  Laterality: N/A;    Allergies  Allergen Reactions   Atenolol Other (See Comments)    HR and pulse dropped   Metoprolol Succinate [Metoprolol] Other (See Comments)    HR and pulse dropped   Baclofen Anxiety and Other (See Comments)    Makes the patient feel anxious the day after taking this. "Makes me jerk"    Latex Rash    Prior to Admission medications   Medication Sig Start Date End Date Taking? Authorizing Provider  Acetaminophen (TYLENOL PO) Take 1 tablet by mouth as needed (pain).    [provider]  albuterol (VENTOLIN HFA) 108 (90 Base) MCG/ACT inhaler Inhale 2 puffs into the lungs as needed for shortness of breath. 03/21/14   [provider]  alprazolam Prudy Feeler) 2 MG  tablet Take 0.5 tablets (1 mg total) by mouth 2 (two) times daily as needed for anxiety. 08/18/22   Meredeth Ide, MD  atorvastatin (LIPITOR) 40 MG tablet Take 1 tablet (40 mg total) by mouth 2 (two) times a week. 03/18/23   Baldo Daub, MD  cyclobenzaprine (FLEXERIL) 10 MG tablet Take 10 mg by mouth daily as needed for muscle spasms. 03/02/23   [provider]  diltiazem (DILACOR XR) 240 MG 24 hr capsule Take 240 mg by mouth daily as needed (tachycardia).    [provider]  docusate sodium (COLACE) 100 MG capsule Take 1 capsule (100 mg total) by mouth 2 (two) times daily. 10/06/21   Swayze, Ava, DO  furosemide (LASIX) 40 MG tablet Take 20 mg by mouth 2 (two) times daily. 03/03/23   [provider]  hydrochlorothiazide (HYDRODIURIL) 25 MG tablet Take 25 mg by mouth daily. 02/04/23   [provider]  KLOR-CON M20 20 MEQ tablet TAKE 3 TABLETS (60 MEQ TOTAL) BY MOUTH SEE ADMIN INSTRUCTIONS. CRUSH AND MIX 60 MEQ WITH APPLESAUCE AND EAT ONCE A  DAY 04/28/23   Baldo Daub, MD  levothyroxine (SYNTHROID) 75 MCG tablet Take 75 mcg by mouth daily before breakfast.    [provider]  lidocaine (LIDODERM) 5 % Place 1 patch onto the skin daily. Remove & Discard patch within 12 hours or as directed by MD 08/18/22   Meredeth Ide, MD  Menthol, Topical Analgesic, 2 % GEL Apply 1 Application topically as needed (pain).    [provider]  nitroGLYCERIN (NITROSTAT) 0.4 MG SL tablet Place 1 tablet (0.4 mg total) under the tongue every 5 (five) minutes as needed for chest pain. 05/18/22   Baldo Daub, MD  pantoprazole (PROTONIX) 40 MG tablet TAKE 1 TABLET BY MOUTH EVERY DAY 03/09/23   Baldo Daub, MD  warfarin (COUMADIN) 5 MG tablet Take 7.5 mg by mouth every evening.    [provider]  zolpidem (AMBIEN) 10 MG tablet Take 10 mg by mouth at bedtime as needed for sleep. 11/27/11   [provider]  furosemide (LASIX) 20 MG tablet Take 40 mg by  mouth 2 (two) times daily. 09/21/21   [provider]    Social History   Socioeconomic History   Marital status: Single    Spouse name: Not on file   Number of children: Not on file   Years of education: Not on file   Highest education level: Not on file  Occupational History   Not on file  Tobacco Use   Smoking status: Never   Smokeless tobacco: Never  Substance and Sexual Activity   Alcohol use: Never   Drug use: Never   Sexual activity: Not on file  Other Topics Concern   Not on file  Social History Narrative   Not on file   Social Determinants of Health   Financial Resource Strain: Low Risk  (03/08/2021)   Received from [facility], [facility]   Overall Financial Resource Strain (CARDIA)    Difficulty of Paying Living Expenses: Not hard at all  Food Insecurity: No Food Insecurity (03/08/2021)   Received from [facility], [facility]   Hunger Vital Sign    Worried About Running Out of Food in the Last Year: Never true    Ran Out of Food in the Last Year: Never true  Transportation Needs: No Transportation Needs (03/08/2021)   Received from [facility], [facility]   [facility] - Transportation    Lack of Transportation (Medical): No    Lack of Transportation (Non-Medical): No  Physical Activity: Not on file  Stress: Not on file  Social Connections: Not on file  Intimate Partner Violence: Not on file   Family History  Problem Relation Age of Onset   Leukemia Mother    Stroke Mother    Heart disease Father    Stroke Maternal Grandmother    Heart attack Paternal Grandmother     ROS: Otherwise negative unless mentioned in HPI  Physical Examination  Vitals:   09/12/23 0915 09/12/23 0930  BP: 126/68 131/67  Pulse: 65 66  Resp: (!) 25 (!) 22  SpO2: 96% 93%   There is no height or weight on file to calculate BMI.  General:  WDWN in NAD Gait: Not observed HENT: WNL, normocephalic Pulmonary: normal non-labored breathing,  without Rales, rhonchi,  wheezing Cardiac: regular, Abdomen:  soft, NT/ND, no masses Skin: without rashes Vascular Exam/Pulses: 2+ Dps bilaterally Extremities: without ischemic changes, without Gangrene , without cellulitis; without open wounds;  Musculoskeletal: no muscle wasting or atrophy  Neurologic: A&O X 3;  No focal weakness or paresthesias are detected; speech is fluent/normal Psychiatric:  The pt has Normal affect. Lymph:  Unremarkable  CBC    Component Value Date/Time   WBC 6.7 09/12/2023 0803   RBC 4.64 09/12/2023 0803   HGB 13.2 09/12/2023 0803   HCT 39.5 09/12/2023 0803   PLT 201 09/12/2023 0803   MCV 85.1 09/12/2023 0803   MCH 28.4 09/12/2023 0803   MCHC 33.4 09/12/2023 0803   RDW 15.5 09/12/2023 0803   LYMPHSABS 0.7 08/11/2022 0144   MONOABS 0.5 08/11/2022 0144   EOSABS 0.0 08/11/2022 0144   BASOSABS 0.0 08/11/2022 0144    BMET    Component Value Date/Time   NA 133 (L) 09/12/2023 0803   NA 142 12/10/2021 1401   K 2.7 (LL) 09/12/2023 0803   CL 96 (L) 09/12/2023 0803   CO2 25 09/12/2023 0803   GLUCOSE 109 (H) 09/12/2023 0803   BUN 11 09/12/2023 0803   BUN 13 12/10/2021 1401   CREATININE 1.21 (H) 09/12/2023 0803   CALCIUM 8.9 09/12/2023 0803   GFRNONAA 46 (L) 09/12/2023 0803   GFRAA  09/03/2008 0945    >60        The eGFR has been calculated using the MDRD equation. This calculation has not been validated in all clinical    COAGS: Lab Results  Component Value Date   INR 2.2 (H) 09/12/2023   INR 2.6 (H) 08/18/2022   INR 2.5 (H) 08/17/2022       ASSESSMENT/PLAN: This is a 77 y.o. female who presents from an outside hospital with shortness of breath and labs concerning for heart failure.  Imaging also demonstrated type B aortic dissection zone 3-5.  She has severe kyphosis which is given her chronic back pain for 5+ years.  Home medications include Xanax for anxiety and methadone for pain.  She has had no change in her back pain.  Her reason  for presentation to the ED was shortness of breath.  I was told BNP elevated on labs from [facility].  At this time, my plan is to treat Bernis conservatively with anti impulse control. Plan will be restudy in 48 hours.   Victorino Sparrow MD [facility] [phone] 09/12/2023  10:14 AM

## 2023-09-13 ENCOUNTER — Inpatient Hospital Stay (HOSPITAL_COMMUNITY): Payer: Medicare Other

## 2023-09-13 ENCOUNTER — Encounter (HOSPITAL_COMMUNITY): Payer: Self-pay | Admitting: Pulmonary Disease

## 2023-09-13 DIAGNOSIS — I71012 Dissection of descending thoracic aorta: Secondary | ICD-10-CM | POA: Diagnosis not present

## 2023-09-13 DIAGNOSIS — I10 Essential (primary) hypertension: Secondary | ICD-10-CM | POA: Diagnosis not present

## 2023-09-13 DIAGNOSIS — R0609 Other forms of dyspnea: Secondary | ICD-10-CM

## 2023-09-13 DIAGNOSIS — T82111S Breakdown (mechanical) of cardiac pulse generator (battery), sequela: Secondary | ICD-10-CM | POA: Diagnosis not present

## 2023-09-13 DIAGNOSIS — I5032 Chronic diastolic (congestive) heart failure: Secondary | ICD-10-CM | POA: Diagnosis not present

## 2023-09-13 DIAGNOSIS — I97190 Other postprocedural cardiac functional disturbances following cardiac surgery: Secondary | ICD-10-CM | POA: Diagnosis not present

## 2023-09-13 DIAGNOSIS — T82111A Breakdown (mechanical) of cardiac pulse generator (battery), initial encounter: Secondary | ICD-10-CM

## 2023-09-13 LAB — CBC
HCT: 38 % (ref 36.0–46.0)
Hemoglobin: 12.4 g/dL (ref 12.0–15.0)
MCH: 29.1 pg (ref 26.0–34.0)
MCHC: 32.6 g/dL (ref 30.0–36.0)
MCV: 89.2 fL (ref 80.0–100.0)
Platelets: 165 10*3/uL (ref 150–400)
RBC: 4.26 MIL/uL (ref 3.87–5.11)
RDW: 15.7 % — ABNORMAL HIGH (ref 11.5–15.5)
WBC: 5.1 10*3/uL (ref 4.0–10.5)
nRBC: 0 % (ref 0.0–0.2)

## 2023-09-13 LAB — ECHOCARDIOGRAM COMPLETE
AR max vel: 2.73 cm2
AV Area VTI: 2.88 cm2
AV Area mean vel: 3.06 cm2
AV Mean grad: 4 mm[Hg]
AV Peak grad: 8 mm[Hg]
Ao pk vel: 1.42 m/s
Area-P 1/2: 4.24 cm2
S' Lateral: 3.8 cm
Weight: 2017.65 [oz_av]

## 2023-09-13 LAB — GLUCOSE, CAPILLARY
Glucose-Capillary: 108 mg/dL — ABNORMAL HIGH (ref 70–99)
Glucose-Capillary: 92 mg/dL (ref 70–99)
Glucose-Capillary: 158 mg/dL — ABNORMAL HIGH (ref 70–99)

## 2023-09-13 LAB — BASIC METABOLIC PANEL
Anion gap: 6 (ref 5–15)
BUN: 14 mg/dL (ref 8–23)
CO2: 25 mmol/L (ref 22–32)
Calcium: 8.2 mg/dL — ABNORMAL LOW (ref 8.9–10.3)
Chloride: 99 mmol/L (ref 98–111)
Creatinine, Ser: 0.92 mg/dL (ref 0.44–1.00)
GFR, Estimated: >60 mL/min (ref >60)
Glucose, Bld: 101 mg/dL — ABNORMAL HIGH (ref 70–99)
Potassium: 3.5 mmol/L (ref 3.5–5.1)
Sodium: 130 mmol/L — ABNORMAL LOW (ref 135–145)

## 2023-09-13 LAB — PHOSPHORUS: Phosphorus: 3 mg/dL (ref 2.5–4.6)

## 2023-09-13 LAB — PROTIME-INR
INR: 1.4 — ABNORMAL HIGH (ref 0.8–1.2)
Prothrombin Time: 17.6 s — ABNORMAL HIGH (ref 11.4–15.2)

## 2023-09-13 LAB — MAGNESIUM: Magnesium: 2.5 mg/dL — ABNORMAL HIGH (ref 1.7–2.4)

## 2023-09-13 MED ORDER — POTASSIUM CHLORIDE CRYS ER 20 MEQ PO TBCR
40.0000 meq | EXTENDED_RELEASE_TABLET | Freq: Once | ORAL | Status: AC
  Administered 2023-09-13: 40 meq via ORAL
  Filled 2023-09-13: qty 2

## 2023-09-13 MED ORDER — DILTIAZEM HCL ER 240 MG PO CP24: 240.0000 mg | ORAL_CAPSULE | Freq: Every day | ORAL | Status: DC | PRN

## 2023-09-13 MED ORDER — IOHEXOL 350 MG/ML SOLN
75.0000 mL | Freq: Once | INTRAVENOUS | Status: AC | PRN
  Administered 2023-09-13: 75 mL via INTRAVENOUS

## 2023-09-13 MED ORDER — DILTIAZEM HCL ER COATED BEADS 180 MG PO CP24
180.0000 mg | ORAL_CAPSULE | Freq: Every day | ORAL | Status: DC
  Administered 2023-09-13: 180 mg via ORAL
  Filled 2023-09-13: qty 1

## 2023-09-13 MED ORDER — HEPARIN BOLUS VIA INFUSION
300.0000 [IU] | Freq: Once | INTRAVENOUS | Status: DC
  Filled 2023-09-13: qty 300

## 2023-09-13 MED ORDER — ATORVASTATIN CALCIUM 40 MG PO TABS
40.0000 mg | ORAL_TABLET | ORAL | Status: DC
  Administered 2023-09-13 – 2023-09-16 (×2): 40 mg via ORAL
  Filled 2023-09-13 (×2): qty 1

## 2023-09-13 MED ORDER — HEPARIN (PORCINE) 25000 UT/250ML-% IV SOLN
900.0000 [IU]/h | INTRAVENOUS | Status: AC
  Administered 2023-09-13: 800 [IU]/h via INTRAVENOUS
  Filled 2023-09-13: qty 250

## 2023-09-13 MED ORDER — CYCLOBENZAPRINE HCL 10 MG PO TABS
10.0000 mg | ORAL_TABLET | Freq: Every day | ORAL | Status: DC | PRN
  Administered 2023-09-18: 10 mg via ORAL
  Filled 2023-09-13 (×2): qty 1

## 2023-09-13 MED ORDER — PERFLUTREN LIPID MICROSPHERE
1.0000 mL | INTRAVENOUS | Status: AC | PRN
  Administered 2023-09-13: 10 mL via INTRAVENOUS

## 2023-09-13 MED ORDER — HEPARIN BOLUS VIA INFUSION
3000.0000 [IU] | Freq: Once | INTRAVENOUS | Status: DC
  Filled 2023-09-13: qty 3000

## 2023-09-13 NOTE — Progress Notes (Signed)
  Echocardiogram 2D Echocardiogram has been performed.  Leda Roys RDCS 09/13/2023, 10:13 AM

## 2023-09-13 NOTE — Progress Notes (Signed)
Rounding Note    Patient Name: [name] [name] Date of Encounter: 09/13/2023  [facility] Cardiologist: Norman Herrlich, MD   Subjective   No CP or dyspnea  Inpatient Medications    Scheduled Meds:  ALPRAZolam  2 mg Oral BID   atorvastatin  40 mg Oral Once per day on Monday Thursday   Chlorhexidine Gluconate Cloth  6 each Topical Daily   furosemide  40 mg Oral Daily   insulin aspart  0-9 Units Subcutaneous Q4H   levothyroxine  75 mcg Oral Q0600   pantoprazole  40 mg Oral Daily   Continuous Infusions:  diltiazem (CARDIZEM) infusion 5 mg/hr (09/13/23 0700)   PRN Meds: acetaminophen, albuterol, cyclobenzaprine, diltiazem, docusate sodium, mouth rinse, oxyCODONE, polyethylene glycol   Vital Signs    Vitals:   09/13/23 0407 09/13/23 0500 09/13/23 0600 09/13/23 0700  BP:  (!) 93/53 (!) 86/50 114/66  Pulse:  65 65 65  Resp:  18 (!) 22 16  Temp: 97.6 F (36.4 C)     TempSrc: Axillary     SpO2:  95% 93% 96%  Weight:  57.2 kg      Intake/Output Summary (Last 24 hours) at 09/13/2023 0732 Last data filed at 09/13/2023 0700 Gross per 24 hour  Intake 468.03 ml  Output --  Net 468.03 ml      09/13/2023    5:00 AM 03/17/2023    1:09 PM 08/15/2022    5:00 AM  Last 3 Weights  Weight (lbs) 126 lb 1.7 oz 120 lb 3.2 oz 117 lb 15.1 oz  Weight (kg) 57.2 kg 54.522 kg 53.5 kg      Telemetry    Sinus with ventricular pacing- Personally Reviewed   Physical Exam   GEN: No acute distress.   Neck: No JVD Cardiac: RRR, no murmurs, rubs, or gallops.  Respiratory: Clear to auscultation bilaterally. GI: Soft, nontender, non-distended  MS: No edema. Neuro:  Nonfocal  Psych: Normal affect   Labs    High Sensitivity Troponin:   Recent Labs  Lab 09/12/23 0803  TROPONINIHS 24*     Chemistry Recent Labs  Lab 09/12/23 0803 09/12/23 1421 09/13/23 0233  NA 133* 131* 130*  K 2.7* 4.8 3.5  CL 96* 97* 99  CO2 25 25 25   GLUCOSE 109* 118* 101*  BUN 11 12  14   CREATININE 1.21* 1.02* 0.92  CALCIUM 8.9 8.8* 8.2*  MG 2.3  --  2.5*  PROT 6.9  --   --   ALBUMIN 3.9  --   --   AST 25  --   --   ALT 14  --   --   ALKPHOS 71  --   --   BILITOT 1.5*  --   --   GFRNONAA 46* 57* >60  ANIONGAP 12 9 6      Hematology Recent Labs  Lab 09/12/23 0803 09/13/23 0233  WBC 6.7 5.1  RBC 4.64 4.26  HGB 13.2 12.4  HCT 39.5 38.0  MCV 85.1 89.2  MCH 28.4 29.1  MCHC 33.4 32.6  RDW 15.5 15.7*  PLT 201 165    BNP Recent Labs  Lab 09/12/23 1200  BNP 419.8*      Radiology    DG Chest Port 1 View  Result Date: 09/12/2023 CLINICAL DATA:  Dyspnea.  History of aortic dissection. EXAM: PORTABLE CHEST 1 VIEW COMPARISON:  08/11/2022 FINDINGS: There is a left chest wall pacer device with leads in the right atrial appendage and right  ventricle. Previous median sternotomy and mitral valve replacement. Status post TAVR. Aortic atherosclerosis and cardiac enlargement. Lungs are hyperinflated. No pleural fluid, interstitial edema or airspace disease. Visualized osseous structures appear unremarkable. IMPRESSION: 1. No acute findings. 2. Cardiomegaly. 3. Hyperinflation. Electronically Signed   By: Signa Kell M.D.   On: 09/12/2023 08:19      Patient Profile     77 y.o. female with past medical history of mechanical mitral valve, history of TAVR, status post pacemaker, paroxysmal atrial fibrillation, coronary artery disease, hypertension, hyperlipidemia, COPD, thoracic aortic aneurysm admitted with aortic dissection for evaluation of hypertension and dyspnea.  Most recent echocardiogram February 2024 showed normal LV function, grade 2 diastolic dysfunction, severe left atrial enlargement, status post mitral valve replacement, moderate tricuspid regurgitation, status post aortic valve replacement with no aortic insufficiency, mildly dilated ascending aorta at 41 mm.  CT of the chest at [facility] by report showed dissection from the isthmus to the descending  thoracic aorta and dilated ascending aorta at 4.2 cm.  Assessment & Plan    1 type B aortic dissection-vascular surgery is following and plans conservative measures.  Continue strict blood pressure control.  Will discontinue IV Cardizem and treat with Cardizem CD 180 mg daily.  Continue Lasix at present dose.  Note she does not appear to be volume overloaded on examination.  2 coronary artery disease-last catheterization in December 2022 showed patent LAD stent.  Continue medical therapy.  Continue statin.  3 history of paroxysmal atrial fibrillation-patient is on chronic Coumadin for her mitral valve replacement.  Will resume when okay with vascular surgery.  4 status post mechanical mitral valve and history of TAVR-continue SBE prophylaxis.  Resume Coumadin at home dose when okay with vascular surgery.  Follow-up echocardiogram is pending.  5 history of [employer] pacemaker-by report patient has not seen her electrophysiologist since October 2022.  Based on previous outside notes by Dr. Garner Nash she may have reached ERI.  Will have device interrogated.  6 hypertension-given aortic dissection will need strict control.  7 hyperlipidemia-continue statin.  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  Will need follow-up imaging in the future.  9 chronic diastolic congestive heart failure-she appears to be euvolemic.  Continue Lasix at present dose.  For questions or updates, please contact  [facility] Please consult [email or web address] for contact info under        Signed, Olga Millers, MD  09/13/2023, 7:32 AM

## 2023-09-13 NOTE — Progress Notes (Addendum)
  Progress Note    09/13/2023 8:12 AM * No surgery found *  Subjective: says overall she is feeling better. Says she recently lost one of her sisters so she was taking more pain medication then she should have. Denies any back or chest pain   Vitals:   09/13/23 0600 09/13/23 0700  BP: (!) 86/50 114/66  Pulse: 65 65  Resp: (!) 22 16  Temp:    SpO2: 93% 96%   Physical Exam: Cardiac:  regular Lungs:  non labored Extremities:  2+ radial pulses bilaterally, 2+ DP pulses bilaterally Abdomen:  soft Neurologic: alert and oriented  CBC    Component Value Date/Time   WBC 5.1 09/13/2023 0233   RBC 4.26 09/13/2023 0233   HGB 12.4 09/13/2023 0233   HCT 38.0 09/13/2023 0233   PLT 165 09/13/2023 0233   MCV 89.2 09/13/2023 0233   MCH 29.1 09/13/2023 0233   MCHC 32.6 09/13/2023 0233   RDW 15.7 (H) 09/13/2023 0233   LYMPHSABS 0.7 08/11/2022 0144   MONOABS 0.5 08/11/2022 0144   EOSABS 0.0 08/11/2022 0144   BASOSABS 0.0 08/11/2022 0144    BMET    Component Value Date/Time   NA 130 (L) 09/13/2023 0233   NA 142 12/10/2021 1401   K 3.5 09/13/2023 0233   CL 99 09/13/2023 0233   CO2 25 09/13/2023 0233   GLUCOSE 101 (H) 09/13/2023 0233   BUN 14 09/13/2023 0233   BUN 13 12/10/2021 1401   CREATININE 0.92 09/13/2023 0233   CALCIUM 8.2 (L) 09/13/2023 0233   GFRNONAA >60 09/13/2023 0233   GFRAA  09/03/2008 0945    >60        The eGFR has been calculated using the MDRD equation. 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 INR was supratherapeautic on presentation now 2.2. Likely to resume Warfarin following repeat imaging Renal function stable Plan for repeat CTA tomorrow 11/26    Graceann Congress, [place] [facility] [phone] 09/13/2023 8:12 AM  VASCULAR STAFF ADDENDUM: I agree with the above.  Pending new imaging to determine anticoagulation for mechanical mitral valve  Victorino Sparrow MD [facility] Phone Number: [phone] 09/13/2023 6:21 PM

## 2023-09-13 NOTE — Consult Note (Addendum)
ELECTROPHYSIOLOGY CONSULT NOTE    Patient ID: [name] [name] MRN: [id number], DOB/AGE: Mar 07, 1946 77 y.o.  Admit date: 09/12/2023 Date of Consult: 09/13/2023  Primary Physician: Marylen Ponto, MD Primary Cardiologist: Norman Herrlich, MD  Electrophysiologist: Dr. Graciela Husbands   Referring Provider: Dr. Katrinka Blazing  Patient Profile: [name] [name] is a 77 y.o. 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 Katrinka Blazing.  HPI:  [name] [name] is a 77 y.o. female who presented to [facility] with weakness, SOB and feeling generally unwell after running out of her xanax and ambien.   Initial ER evaluation notable for negative troponin, BNP 2660, INR 6.4, hyponatremia/hypokalemia, low osmolality, UDS positive for THC, methadone, benzos. CT chest: dissection from isthmus to descending thoracic aorta & aneurysm ascending aorta 4.2 cm. She was started on esmolol gtt, arrived on dilt gtt. Her last dose of coumadin was on 11/23.  She was transferred to [facility] for further evaluation.  She was noted to have not seen her EP provider, Dr. Garner Nash at [facility], since 07/2021.  Device was interrogated and showed she was pacing at VVI 65 bpm with [facility] as of 07/2022.  She notes she has been more tired this past year and SOB.   He denies chest pain, palpitations, dyspnea, PND, orthopnea, nausea, vomiting, dizziness, syncope, edema, weight gain, or early satiety.   Labs Potassium3.5 (11/25 4098) Magnesium  2.5* (11/25 0233) Creatinine, ser  0.92 (11/25 0233) PLT  165 (11/25 0233) HGB  12.4 (11/25 0233) WBC 5.1 (11/25 0233) Troponin I (High Sensitivity)24* (11/24 1191).    Past Medical  History:  Diagnosis Date   Acute on chronic HFrEF (heart failure with reduced ejection fraction) (HCC) 08/26/2021   AKI (acute kidney injury) (HCC) 09/30/2021   Anxiety    Aortic aneurysm (HCC)    Atrial arrhythmia    With prior history of atrial flutter ablation   Bradycardia    s/p pacemaker implantation   Chronic anticoagulation 08/26/2021   Chronic combined systolic and diastolic CHF (congestive heart failure) (HCC) 09/30/2021   CKD (chronic kidney disease) stage 3, GFR 30-59 ml/min (HCC) 08/26/2021   Closed right hip fracture (HCC) 08/11/2022   CONGESTIVE HEART FAILURE 07/31/2010   Qualifier: Diagnosis of  By: Graciela Husbands, MD, Susie Cassette    Coronary atherosclerosis 06/08/2014   Last Assessment & Plan: Formatting of this note might be different from the original. Complains of consistent and persistent shortness of breath.  She has been experiencing some bilateral arm pain.  She is certain she did not experience that discomfort but that was not her anginal equivalent when she had a myocardial infarction several years back.  With that said, symptoms do improve with the subl   DNR (do not resuscitate) 09/30/2021   Dysphagia 03/10/2023   Essential hypertension 07/31/2010   Qualifier: Diagnosis of   By: Graciela Husbands, MD, Susie Cassette      Fall at home, initial encounter 08/11/2022   GERD (gastroesophageal reflux disease)    H/O mitral valve replacement with mechanical valve 10/14/2021   Hypokalemia 10/17/2021   Hypothyroidism    Hypoxemia 08/26/2021   Influenza A 08/26/2021   Left ventricular  dysfunction 06/08/2014   Long Q-T syndrome    Malnutrition of moderate degree 08/28/2021   Mixed hyperlipidemia 12/12/2019   Myocardial infarct (HCC) 11/20/2011   Stent placement for 100% blockage of LAD   Pacemaker reprogramming/check 10/19/2008   Last Assessment & Plan: Formatting of this note might be different from the original. Normal Device Function ----- NEARING Elective  Replacement ---- Presents in sinus rhythm. No Permanent Device Reprogramming Required -  Follow up 3-4 months, remote monitoring on a quarterly basis, as per protocol.  Encouraged follow up with PCP and Cardiologist.   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 Excell Seltzer and Dr. Laneta Simmers   Senile nuclear sclerosis 10/17/2015   Severe aortic stenosis 08/26/2021   SHORTNESS OF BREATH 07/31/2010   Qualifier: Diagnosis of   By: Graciela Husbands, MD, Susie Cassette      Supratherapeutic INR 08/11/2022   Thoracic aortic aneurysm 05/13/2021   Transaminitis    TRICUSPID REGURGITATION 12/23/2010   Qualifier: Diagnosis of  By: Graciela Husbands, MD, Susie Cassette      Surgical History:  Past Surgical History:  Procedure Laterality Date   APPENDECTOMY     CARDIAC ELECTROPHYSIOLOGY STUDY AND ABLATION     Stent and pacemaker placement   CHOLECYSTECTOMY     CORONARY ANGIOPLASTY WITH STENT PLACEMENT Left 09/13/2015   Dr. Josefa Half at [facility], LAD stent   INTRAOPERATIVE TRANSTHORACIC ECHOCARDIOGRAM N/A 10/14/2021   Procedure: INTRAOPERATIVE TRANSTHORACIC ECHOCARDIOGRAM;  Surgeon: Tonny Bollman, MD;  Location: [facility] OR;  Service: Open Heart Surgery;  Laterality: N/A;   MITRAL VALVE REPLACEMENT  2001   mechanical valve   OOPHORECTOMY     PACEMAKER INSERTION  11/20/2007   Dual chamber pacemaker implantation with rapid ventricular pacing   RIGHT/LEFT HEART CATH AND CORONARY ANGIOGRAPHY N/A 10/03/2021   Procedure: RIGHT/LEFT HEART CATH AND CORONARY ANGIOGRAPHY;  Surgeon: Tonny Bollman, MD;  Location: [facility] INVASIVE CV LAB;  Service: Cardiovascular;  Laterality: N/A;   SALPINGECTOMY Bilateral    TONSILLECTOMY     TOTAL  HIP ARTHROPLASTY Right 08/12/2022   Procedure: RIGHT TOTAL HIP ARTHROPLASTY;  Surgeon: Joen Laura, MD;  Location: [facility] OR;  Service: Orthopedics;  Laterality: Right;   TRANSCATHETER AORTIC VALVE REPLACEMENT, TRANSFEMORAL N/A 10/14/2021   Procedure: TRANSCATHETER AORTIC VALVE REPLACEMENT, TRANSFEMORAL;  Surgeon: Tonny Bollman, MD;  Location: [facility] OR;  Service: Open Heart Surgery;  Laterality: N/A;     Medications Prior to Admission  Medication Sig Dispense Refill Last Dose   acetaminophen (TYLENOL) 500 MG tablet Take 500 mg by mouth every 6 (six) hours as needed for mild pain (pain score 1-3).   Past Week   albuterol (VENTOLIN HFA) 108 (90 Base) MCG/ACT inhaler Inhale 2 puffs into the lungs as needed for shortness of breath.   Past Month   alprazolam (XANAX) 2 MG tablet Take 0.5 tablets (1 mg total) by mouth 2 (two) times daily as needed for anxiety. 10 tablet 0 09/07/2023   atorvastatin (LIPITOR) 40 MG tablet Take 1 tablet (40 mg total) by mouth 2 (two) times a week. 35 tablet 3 09/09/2023   cyclobenzaprine (FLEXERIL) 10 MG tablet Take 10 mg by mouth daily as needed for muscle spasms.   Past Week   diltiazem (DILACOR  XR) 240 MG 24 hr capsule Take 240 mg by mouth daily as needed (tachycardia).   09/10/2023   docusate sodium (COLACE) 100 MG capsule Take 1 capsule (100 mg total) by mouth 2 (two) times daily. (Patient taking differently: Take 100 mg by mouth daily as needed for mild constipation.) 10 capsule 0 Past Week   furosemide (LASIX) 40 MG tablet Take 20 mg by mouth 2 (two) times daily.   09/10/2023   hydrochlorothiazide (HYDRODIURIL) 25 MG tablet Take 25 mg by mouth daily.   09/10/2023   KLOR-CON M20 20 MEQ tablet TAKE 3 TABLETS (60 MEQ TOTAL) BY MOUTH SEE ADMIN INSTRUCTIONS. CRUSH AND MIX 60 MEQ WITH APPLESAUCE AND EAT ONCE A DAY (Patient taking differently: Take 20 mEq by mouth 4 (four) times daily.) 90 tablet 0 09/10/2023   levothyroxine (SYNTHROID) 75 MCG tablet Take 75 mcg by mouth  daily before breakfast.   09/10/2023   Menthol, Topical Analgesic, 2 % GEL Apply 1 Application topically as needed (pain).   Past Week   nitroGLYCERIN (NITROSTAT) 0.4 MG SL tablet Place 1 tablet (0.4 mg total) under the tongue every 5 (five) minutes as needed for chest pain. 25 tablet 3 unknown   pantoprazole (PROTONIX) 40 MG tablet TAKE 1 TABLET BY MOUTH EVERY DAY 90 tablet 2 09/10/2023   warfarin (COUMADIN) 5 MG tablet Take 7.5 mg by mouth every evening.   09/09/2023 at 1900   zolpidem (AMBIEN) 10 MG tablet Take 10 mg by mouth at bedtime as needed for sleep.   Past Week    Inpatient Medications:   ALPRAZolam  2 mg Oral BID   atorvastatin  40 mg Oral Once per day on Monday Thursday   Chlorhexidine Gluconate Cloth  6 each Topical Daily   diltiazem  180 mg Oral Daily   furosemide  40 mg Oral Daily   levothyroxine  75 mcg Oral Q0600   pantoprazole  40 mg Oral Daily    Allergies:  Allergies  Allergen Reactions   Atenolol Other (See Comments)    HR and pulse dropped   Metoprolol Succinate [Metoprolol] Other (See Comments)    HR and pulse dropped   Baclofen Anxiety and Other (See Comments)    Makes the patient feel anxious the day after taking this. "Makes me jerk"    Latex Rash    Family History  Problem Relation Age of Onset   Leukemia Mother    Stroke Mother    Heart disease Father    Stroke Maternal Grandmother    Heart attack Paternal Grandmother      Physical Exam: Vitals:   09/13/23 0827 09/13/23 0900 09/13/23 1000 09/13/23 1116  BP:  114/76 136/78 139/80  Pulse:  65 65 67  Resp:  20 19 18   Temp: 98 F (36.7 C)   97.7 F (36.5 C)  TempSrc: Oral   Oral  SpO2:  97% 96% 96%  Weight:        GEN- elderly female sitting on side of bed in NAD, A&O x 3, normal affect HEENT: Normocephalic, atraumatic Lungs- CTAB, Normal effort.  Heart- Regular rate and rhythm, No M/G/R.  GI- Soft, NT, ND.  Extremities- No clubbing, cyanosis, or edema   Radiology/Studies:  ECHOCARDIOGRAM COMPLETE  Result Date: 09/13/2023    ECHOCARDIOGRAM REPORT   Patient Name:   TUYEN DOONER Date of Exam: 09/13/2023 Medical Rec #:  [id number]          Height:       58.0 in  Accession #:    [id number]         Weight:       126.1 lb Date of Birth:  10-08-1946          BSA:          1.497 m Patient Age:    77 years           BP:           199/82 mmHg Patient Gender: F                  HR:           65 bpm. Exam Location:  Inpatient Procedure: 2D Echo, Cardiac Doppler, Color Doppler and Intracardiac            Opacification Agent Indications:    Dyspnea R06.00  History:        Patient has prior history of Echocardiogram examinations, most                 recent 11/23/2022. CHF, Previous Myocardial Infarction, Pacemaker,                 Aortic Valve Disease and Mitral Valve Disease; Risk                 Factors:Hypertension.  Sonographer:    Harriette Bouillon RDCS Referring Phys: [phone] HAO MENG IMPRESSIONS  1. Left ventricular ejection fraction, by estimation, is 30 to 35%. The left ventricle has moderately decreased function. The left ventricle demonstrates global hypokinesis. There is mild concentric left ventricular hypertrophy. Left ventricular diastolic parameters are indeterminate.  2. Right ventricular systolic function is mildly reduced. The right ventricular size is normal. There is mildly elevated pulmonary artery systolic pressure.  3. Left atrial size was severely dilated.  4. Right atrial size was moderately dilated.  5. MV peak gradient with MVA by P1/2t 4.2cm2. The mitral valve has been repaired/replaced. No evidence of mitral valve regurgitation. No evidence of mitral stenosis.  6. Tricuspid valve regurgitation is moderate.  7. The aortic valve has been repaired/replaced. Aortic valve regurgitation is not visualized. No aortic stenosis is present. Echo findings are consistent with normal structure and function of the aortic valve prosthesis. Aortic valve area, by VTI measures 2.88  cm. Aortic valve mean gradient measures 4.0 mmHg. Aortic valve Vmax measures 1.42 m/s.  8. The inferior vena cava is normal in size with greater than 50% respiratory variability, suggesting right atrial pressure of 3 mmHg. FINDINGS  Left Ventricle: Left ventricular ejection fraction, by estimation, is 30 to 35%. The left ventricle has moderately decreased function. The left ventricle demonstrates global hypokinesis. The left ventricular internal cavity size was normal in size. There is mild concentric left ventricular hypertrophy. Abnormal (paradoxical) septal motion, consistent with RV pacemaker. Left ventricular diastolic parameters are indeterminate. Right Ventricle: The right ventricular size is normal. No increase in right ventricular wall thickness. Right ventricular systolic function is mildly reduced. There is mildly elevated pulmonary artery systolic pressure. The tricuspid regurgitant velocity  is 2.91 m/s, and with an assumed right atrial pressure of 3 mmHg, the estimated right ventricular systolic pressure is 36.9 mmHg. Left [facility]: Left atrial size was severely dilated. Right [facility]: Right atrial size was moderately dilated. Pericardium: There is no evidence of pericardial effusion. Mitral Valve: MV peak gradient with MVA by P1/2t 4.2cm2. The mitral valve has been repaired/replaced. No evidence of mitral valve regurgitation. There is a mechanical valve present in the mitral  position. No evidence of mitral valve stenosis. Tricuspid Valve: The tricuspid valve is normal in structure. Tricuspid valve regurgitation is moderate . No evidence of tricuspid stenosis. Aortic Valve: The aortic valve has been repaired/replaced. Aortic valve regurgitation is not visualized. No aortic stenosis is present. Aortic valve mean gradient measures 4.0 mmHg. Aortic valve peak gradient measures 8.0 mmHg. Aortic valve area, by VTI measures 2.88 cm. There is a 23 mm Ultra, stented (TAVR) valve present in the aortic  position. Echo findings are consistent with normal structure and function of the aortic valve prosthesis. Pulmonic Valve: The pulmonic valve was normal in structure. Pulmonic valve regurgitation is trivial. No evidence of pulmonic stenosis. Aorta: The aortic root is normal in size and structure. Venous: The inferior vena cava is normal in size with greater than 50% respiratory variability, suggesting right atrial pressure of 3 mmHg. IAS/Shunts: No atrial level shunt detected by color flow Doppler. Additional Comments: A device lead is visualized.  LEFT VENTRICLE PLAX 2D LVIDd:         4.70 cm   Diastology LVIDs:         3.80 cm   LV e' medial:    4.45 cm/s LV PW:         1.20 cm   LV E/e' medial:  28.3 LV IVS:        1.20 cm   LV e' lateral:   6.75 cm/s LVOT diam:     2.00 cm   LV E/e' lateral: 18.7 LV SV:         70 LV SV Index:   47 LVOT Area:     3.14 cm  RIGHT VENTRICLE         IVC TAPSE (M-mode): 1.1 cm  IVC diam: 1.50 cm LEFT ATRIUM            Index        RIGHT ATRIUM           Index LA diam:      4.60 cm  3.07 cm/m   RA Area:     22.60 cm LA Vol (A4C): 106.0 ml 70.81 ml/m  RA Volume:   74.70 ml  49.90 ml/m  AORTIC VALVE AV Area (Vmax):    2.73 cm AV Area (Vmean):   3.06 cm AV Area (VTI):     2.88 cm AV Vmax:           141.50 cm/s AV Vmean:          92.800 cm/s AV VTI:            0.242 m AV Peak Grad:      8.0 mmHg AV Mean Grad:      4.0 mmHg LVOT Vmax:         123.00 cm/s LVOT Vmean:        90.300 cm/s LVOT VTI:          0.222 m LVOT/AV VTI ratio: 0.92  AORTA Ao Root diam: 3.50 cm MITRAL VALVE                TRICUSPID VALVE MV Area (PHT): 4.24 cm     TR Peak grad:   33.9 mmHg MV Decel Time: 179 msec     TR Vmax:        291.00 cm/s MV E velocity: 126.00 cm/s  SHUNTS                             Systemic VTI:  0.22 m                             Systemic Diam: 2.00 cm Arvilla Meres MD Electronically signed by Arvilla Meres MD Signature Date/Time: 09/13/2023/10:20:14 AM     Final    DG Chest Port 1 View  Result Date: 09/12/2023 CLINICAL DATA:  Dyspnea.  History of aortic dissection. EXAM: PORTABLE CHEST 1 VIEW COMPARISON:  08/11/2022 FINDINGS: There is a left chest wall pacer device with leads in the right atrial appendage and right ventricle. Previous median sternotomy and mitral valve replacement. Status post TAVR. Aortic atherosclerosis and cardiac enlargement. Lungs are hyperinflated. No pleural fluid, interstitial edema or airspace disease. Visualized osseous structures appear unremarkable. IMPRESSION: 1. No acute findings. 2. Cardiomegaly. 3. Hyperinflation. Electronically Signed   By: Signa Kell M.D.   On: 09/12/2023 08:19    EKG: (personally reviewed) EKG 08/15/22 > AF, VP 70's  TELEMETRY: VP 65 bpm (personally reviewed)  STUDIES:  ECHO 11/2022 > LVEF 55-60%, no RWMA, G2DD, LA severely dilated, MV replacement 20 yr ago (unknown type/size), no evidence of MV stenosis > normal structure/ function of MV, TV regurgitation moderate, AV 23 mm Sapien prosthetic (TAVR)  ECHO 09/13/23 > LVEF 30-35%  DEVICE HISTORY:  MDT Dual Chamber PPM   Assessment/Plan:  Tachy-Brady Syndrome s/p MDT Dual PPM  Long QT In context of rheumatic heart disease, prior bioprosthetic MV replacment. Initial PPM implant by Dr. Graciela Husbands in 11/2007 for tachy-brady syndrome. Appears by chart review that she is not consistently followed with [place] or [facility].  -at [facility] as of 07/2022, per interrogation 11/25, VVI 65  -LVEF has been reduced since at least 09/2021 (pre-dates VVI pacing) -NPO after MN for generator change 11/26, possible BiV upgrade by Dr. Daivd Council INR  6.4 on admit, s/p vit K at OSH, 11/25 INR 1.4 -plan for heparin infusion + coumadin this evening if ok with VVS (have reached out)  -stop heparin infusion at 0600 for anticipated device implant   Type B Aortic Aneurysm -VVS following, await input re: further procedures & anticoagulation as above to determine  timing of generator change/ possible BiV upgrade   Paroxysmal AF  AFL  Hx of AFL ablation.  -monitoring by device   HFrEF  LVEF 30-35%, hx of prior MI, cardiac surgery and pacing  -per primary   CAD s/p MI with prior Stent  -per Cardiology   VHD  Hx TAVR, MVR due to rheumatic heart disease, TV regurgitation  -per Cardiology   For questions or updates, please contact [facility] Please consult [email or web address] for contact info under Cardiology/STEMI.  Signed, Canary Brim, MSN, APRN, NP-C, AGACNP-BC [facility] - Electrophysiology  09/13/2023, 11:55 AM  EP Attending  Patient seen and examined. Agree with the findings as noted above. The patien tis a pleasantwoman with a h/o MVR, TAVR, and remote PPM who has been at [facility] for over a year. She has felt unwell. She had a supratherapeutic INR and was given vit k. She has a descending aortic dissection but denies symptoms. She has not had syncope. She appears to have an underlying rhythm in the low 50's. On exam she is stable appearing, NAD. Lungs reveal minimal basilar rales and CV with a RRR  and mechanical MV closure. Tele with ventricular pacing.  A/P PM at EOL - her device has been at [facility] for over a year and reverted to VVI 65/min. She will undergo ppm gen change out. Chronic systolic heart failure - if her left subclavian vein is open, insertion of a CS lead is very reasonable. Would try to avoid left bundle lead if possible due to TR. Mech MV - her INR is 1.4 after Vit K. She will be started on IV heparin and warfarin with plans to re-insert PPM and then allow INR to drift upward after PPM without heparin.  Pacemaker syndrome - this should be taken care of with PM gen change out.  Sharlot Gowda Jahnyla Parrillo,MD

## 2023-09-13 NOTE — Progress Notes (Signed)
PHARMACY - ANTICOAGULATION CONSULT NOTE  Pharmacy Consult for Heparin Indication: atrial fibrillation, mechanical valve  Allergies  Allergen Reactions   Atenolol Other (See Comments)    HR and pulse dropped   Metoprolol Succinate [Metoprolol] Other (See Comments)    HR and pulse dropped   Baclofen Anxiety and Other (See Comments)    Makes the patient feel anxious the day after taking this. "Makes me jerk"    Latex Rash    Patient Measurements: Weight: 57.2 kg (126 lb 1.7 oz) Heparin Dosing Weight: total weight  Vital Signs: Temp: 97.7 F (36.5 C) (11/25 1116) Temp Source: Oral (11/25 1116) BP: 139/80 (11/25 1116) Pulse Rate: 67 (11/25 1116)  Labs: Recent Labs    09/12/23 0803 09/12/23 1421 09/13/23 0233 09/13/23 0814  HGB 13.2  --  12.4  --   HCT 39.5  --  38.0  --   PLT 201  --  165  --   LABPROT 24.5*  --   --  17.6*  INR 2.2*  --   --  1.4*  CREATININE 1.21* 1.02* 0.92  --   TROPONINIHS 24*  --   --   --     CrCl cannot be calculated (Unknown ideal weight.).   Medical History: Past Medical History:  Diagnosis Date   Anxiety    Atrial arrhythmia    With prior history of atrial flutter ablation   Bradycardia    s/p pacemaker implantation   Chronic anticoagulation 08/26/2021   Chronic combined systolic and diastolic CHF (congestive heart failure) (HCC) 09/30/2021   CKD (chronic kidney disease) stage 3, GFR 30-59 ml/min (HCC) 08/26/2021   Closed right hip fracture (HCC) 08/11/2022   CONGESTIVE HEART FAILURE 07/31/2010   Qualifier: Diagnosis of  By: Graciela Husbands, MD, Susie Cassette    Coronary atherosclerosis 06/08/2014   Last Assessment & Plan: Formatting of this note might be different from the original. Complains of consistent and persistent shortness of breath.  She has been experiencing some bilateral arm pain.  She is certain she did not experience that discomfort but that was not her anginal equivalent when she had a myocardial infarction several years  back.  With that said, symptoms do improve with the subl   DNR (do not resuscitate) 09/30/2021   Dysphagia 03/10/2023   Essential hypertension 07/31/2010   Qualifier: Diagnosis of   By: Graciela Husbands, MD, Susie Cassette      Fall at home, initial encounter 08/11/2022   GERD (gastroesophageal reflux disease)    H/O mitral valve replacement with mechanical valve 10/14/2021   Hypothyroidism    Influenza A 08/26/2021   Left ventricular dysfunction 06/08/2014   Long Q-T syndrome    Malnutrition of moderate degree 08/28/2021   Mixed hyperlipidemia 12/12/2019   Myocardial infarct (HCC) 11/20/2011   Stent placement for 100% blockage of LAD   Pacemaker reprogramming/check 10/19/2008   Last Assessment & Plan: Formatting of this note might be different from the original. Normal Device Function ----- NEARING Elective Replacement ---- Presents in sinus rhythm. No Permanent Device Reprogramming Required -  Follow up 3-4 months, remote monitoring on a quarterly basis, as per protocol.  Encouraged follow up with PCP and Cardiologist.   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 Excell Seltzer and Dr.  Bartle   Senile nuclear sclerosis 10/17/2015   Severe aortic stenosis 08/26/2021   SHORTNESS OF BREATH 07/31/2010   Qualifier: Diagnosis of   By: Graciela Husbands, MD, Susie Cassette      Supratherapeutic INR 08/11/2022   Thoracic aortic aneurysm 05/13/2021   Transaminitis    TRICUSPID REGURGITATION 12/23/2010   Qualifier: Diagnosis of  By: Graciela Husbands, MD, Susie Cassette     Medications:  Scheduled:   ALPRAZolam  2 mg Oral BID   atorvastatin  40 mg Oral Once per day on Monday Thursday   Chlorhexidine Gluconate Cloth  6 each Topical Daily   diltiazem  180 mg  Oral Daily   furosemide  40 mg Oral Daily   levothyroxine  75 mcg Oral Q0600   pantoprazole  40 mg Oral Daily   Infusions:  PRN: acetaminophen, albuterol, cyclobenzaprine, docusate sodium, mouth rinse, oxyCODONE, polyethylene glycol  Assessment: 77 yo female on chronic warfarin for pAF, mechanical MVR, now on hold for PPM generator change and possible BiV upgrade. INR on admit 6.4 s/p Vit K, now INR 1.4 today. Pharmacy consulted to dose IV heparin. Patient also with aortic aneurysm - VVS following, further procedures to be determined by EP plans.  Per discussion with EP and VVS, will begin heparin this evening, and hold off on warfarin until results of CTa evaluated.  Goal of Therapy:  Heparin level 0.3-0.7 units/ml Monitor platelets by anticoagulation protocol: Yes   Plan:  Give 3000 units bolus x 1 Start heparin infusion at 800 units/hr Check anti-Xa level in 8 hours and daily while on heparin Continue to monitor H&H and platelets Stop heparin at 06:00 tomorrow for PPM generator change  Loralee Pacas, PharmD, BCPS 09/13/2023,4:08 PM  Please check AMION for all [facility] Pharmacy phone numbers After 10:00 PM, call Main Pharmacy [phone]

## 2023-09-13 NOTE — Plan of Care (Signed)
  Problem: Activity: Goal: Risk for activity intolerance will decrease Outcome: Progressing   Problem: Nutrition: Goal: Adequate nutrition will be maintained Outcome: Progressing   Problem: Elimination: Goal: Will not experience complications related to bowel motility Outcome: Progressing   

## 2023-09-13 NOTE — Progress Notes (Addendum)
Patient updated on anticoagulation issues / pending CTA chest/abd/pelvis. Reviewed findings may potentially delay EP procedures. Will follow up on CT in am, review with VVS and appropriateness for coumadin for mechanical mitral valve. Anticipate she may take a few days to be close to therapeutic for EP procedure (ideal around 1.8 before implant, to avoid heparin post procedure). Patient indicates understanding.   Agree.   Landis Gandy, MSN, APRN, NP-C, AGACNP-BC [facility] - Electrophysiology  09/13/2023, 5:39 PM

## 2023-09-13 NOTE — Progress Notes (Addendum)
NAME:  [name] [name], MRN:  [id number], DOB:  [birth year], LOS: 1 ADMISSION DATE:  09/12/2023, CONSULTATION DATE:  11/24 REFERRING MD:  Dr. Hyacinth Meeker, [facility] EDP , CHIEF COMPLAINT:  Aortic dissection   History of Present Illness:  77 year old female with past medical history as below, which is significant for HFrEF, aortic aneurysm, pacemaker, CKD 3, hypothyroidism, long QT syndrome, coronary artery disease, paroxysmal atrial fibrillation, rheumatic heart disease, and history of TAVR.  She was in her usual state of health until approximately 11/21 when she began to experience weakness and general malaise.  Symptoms progressed to include shortness of breath which caused her to present to [facility] emergency department on 11/23.  Complained of upper back pain as well upon arrival to the emergency department.  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  Aneurysmal dilation of the ascending aorta measuring up to 4.2 cm.  She was started on diltiazem infusion for heart rate and blood pressure control.  Vascular surgery at [facility] was consulted for possible intervention.  She was transferred to the cardiothoracic ICU with PCCM excepting.  Pertinent  Medical History   has a past medical history of Acute on chronic HFrEF (heart failure with reduced ejection fraction) (HCC) (08/26/2021), AKI (acute kidney injury) (HCC) (09/30/2021), Anxiety, Aortic aneurysm (HCC), Atrial arrhythmia, Bradycardia, Chronic anticoagulation (08/26/2021), Chronic combined systolic and diastolic CHF (congestive heart failure) (HCC) (09/30/2021), CKD (chronic kidney disease) stage 3, GFR 30-59 ml/min (HCC) (08/26/2021), Closed right hip fracture (HCC) (08/11/2022), CONGESTIVE HEART FAILURE (07/31/2010), Coronary atherosclerosis (06/08/2014), DNR (do not resuscitate) (09/30/2021),  Dysphagia (03/10/2023), Essential hypertension (07/31/2010), Fall at home, initial encounter (08/11/2022), GERD (gastroesophageal reflux disease), 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), Hypokalemia (10/17/2021), Hypothyroidism, Hypoxemia (08/26/2021), Influenza A (08/26/2021), Left ventricular dysfunction (06/08/2014), Long Q-T syndrome, Malnutrition of moderate degree (08/28/2021), Mixed hyperlipidemia (12/12/2019), Myocardial infarct (HCC) (11/20/2011), Pacemaker reprogramming/check (10/19/2008), PAF (paroxysmal atrial fibrillation) (HCC) (08/11/2022), Presence of permanent cardiac pacemaker, Rheumatic heart disease, 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   Significant Hospital Events: Including procedures, antibiotic start and stop dates in addition to other pertinent events   11/24 admit for aortic dissection  Interim History / Subjective:  No events.  Pain controled with current regimen. Breathing stable.  Objective   Blood pressure (!) 86/50, pulse 65, temperature 97.6 F (36.4 C), temperature source Axillary, resp. rate 16, weight 57.2 kg, SpO2 96%.        Intake/Output Summary (Last 24 hours) at 09/13/2023 0711 Last data filed at 09/13/2023 0700 Gross per 24 hour  Intake 468.03 ml  Output --  Net 468.03 ml   Filed Weights   09/13/23 0500  Weight: 57.2 kg    Examination: No distress Lungs clear Heart sounds regular Advanced arthritic changes Abd soft Ext warm Moves to command Aox3, loquacious  Stable hyponatremia Renal function stable CBG ok   Resolved Hospital Problem list     Assessment & Plan:   Aortic dissection- imaging at OSH: conservative management per VVS - Restart  home dilt, wean off cardizem GTT - Repeat imaging tomorrow, appreciate VVS recs - SBP goal < 130, paced rhythm so will be 65 regardless  Supratherapeutic INR: 6.1 upon presentation. Warfarin for atrial fibrillation  Chronic HFrEF CAD S/p TAVR, mechanical MVR SSS s/p PPM - Check INR, discuss when okay to resume with VVS - F/u echo  Hypothyroid - Continue synthroid  Back pain: she tells me this is a chronic pain. No acute pain related to her presentation currently - PRN hydrocodone.   Substance abuse: UDS positive for marijuana, methodone, opiates. Admitted to [facility] gummy in ED. Denies any opoid use, questioning if she took the wrong bottle.  - TOC consult for outpatient rehab resources if she wants  Best Practice (right click and "Reselect all SmartList Selections" daily)   Diet/type: cardiac DVT prophylaxis: SCD  Pressure ulcer(s): not present on admission  GI prophylaxis: PPI Lines: N/A Foley:  N/A Code Status:  full code Last date of multidisciplinary goals of care discussion [ ]   Can leave ICU once off cardizem drip  32 min cc time Myrla Halsted MD PCCM

## 2023-09-13 NOTE — Progress Notes (Signed)
[employer] rep interrogated the patient's pacemaker. It has reached ERI on 07/30/2022. Unfortunately since the patient device reached ERI 13 month ago, we are unable to tell how long the battery will last. Will inform Dr. Jens Som and discuss with EP.

## 2023-09-14 ENCOUNTER — Other Ambulatory Visit (HOSPITAL_COMMUNITY): Payer: Self-pay

## 2023-09-14 ENCOUNTER — Encounter (HOSPITAL_COMMUNITY)
Admission: AD | Disposition: A | Discharge status: Home Health | Payer: Self-pay | Source: Other Acute Inpatient Hospital | Attending: Internal Medicine

## 2023-09-14 DIAGNOSIS — I255 Ischemic cardiomyopathy: Secondary | ICD-10-CM | POA: Diagnosis not present

## 2023-09-14 LAB — CBC
HCT: 38.9 % (ref 36.0–46.0)
Hemoglobin: 12.3 g/dL (ref 12.0–15.0)
MCH: 28.5 pg (ref 26.0–34.0)
MCHC: 31.6 g/dL (ref 30.0–36.0)
MCV: 90 fL (ref 80.0–100.0)
Platelets: 183 10*3/uL (ref 150–400)
RBC: 4.32 MIL/uL (ref 3.87–5.11)
RDW: 15.7 % — ABNORMAL HIGH (ref 11.5–15.5)
WBC: 7.3 10*3/uL (ref 4.0–10.5)
nRBC: 0 % (ref 0.0–0.2)

## 2023-09-14 LAB — HEPARIN LEVEL (UNFRACTIONATED)
Heparin Unfractionated: 0.28 [IU]/mL — ABNORMAL LOW (ref 0.30–0.70)
Heparin Unfractionated: 0.81 [IU]/mL — ABNORMAL HIGH (ref 0.30–0.70)

## 2023-09-14 LAB — BASIC METABOLIC PANEL
Anion gap: 9 (ref 5–15)
BUN: 16 mg/dL (ref 8–23)
CO2: 25 mmol/L (ref 22–32)
Calcium: 8.9 mg/dL (ref 8.9–10.3)
Chloride: 97 mmol/L — ABNORMAL LOW (ref 98–111)
Creatinine, Ser: 1 mg/dL (ref 0.44–1.00)
GFR, Estimated: 58 mL/min — ABNORMAL LOW (ref >60)
Glucose, Bld: 87 mg/dL (ref 70–99)
Potassium: 4.3 mmol/L (ref 3.5–5.1)
Sodium: 131 mmol/L — ABNORMAL LOW (ref 135–145)

## 2023-09-14 LAB — SURGICAL PCR SCREEN
MRSA, PCR: NEGATIVE
Staphylococcus aureus: NEGATIVE

## 2023-09-14 LAB — MAGNESIUM: Magnesium: 2.2 mg/dL (ref 1.7–2.4)

## 2023-09-14 LAB — PROTIME-INR
INR: 1.3 — ABNORMAL HIGH (ref 0.8–1.2)
Prothrombin Time: 16.5 s — ABNORMAL HIGH (ref 11.4–15.2)

## 2023-09-14 SURGERY — BIV UPGRADE

## 2023-09-14 MED ORDER — HEPARIN BOLUS VIA INFUSION
3000.0000 [IU] | Freq: Once | INTRAVENOUS | Status: AC
Start: 2023-09-14 — End: 2023-09-14
  Administered 2023-09-14: 3000 [IU] via INTRAVENOUS
  Filled 2023-09-14: qty 3000

## 2023-09-14 MED ORDER — SODIUM CHLORIDE 0.9 % IV SOLN: INTRAVENOUS | Status: DC

## 2023-09-14 MED ORDER — SODIUM CHLORIDE 0.9% FLUSH
3.0000 mL | INTRAVENOUS | Status: DC | PRN
Start: 2023-09-14 — End: 2023-09-15

## 2023-09-14 MED ORDER — SODIUM CHLORIDE 0.9% FLUSH
3.0000 mL | Freq: Two times a day (BID) | INTRAVENOUS | Status: DC
Start: 2023-09-14 — End: 2023-09-15
  Administered 2023-09-14: 3 mL via INTRAVENOUS

## 2023-09-14 MED ORDER — CHLORHEXIDINE GLUCONATE 4 % EX SOLN
60.0000 mL | Freq: Once | CUTANEOUS | Status: AC
  Administered 2023-09-14: 4 via TOPICAL

## 2023-09-14 MED ORDER — WARFARIN - PHARMACIST DOSING INPATIENT: Freq: Every day | Status: DC

## 2023-09-14 MED ORDER — CHLORHEXIDINE GLUCONATE 4 % EX SOLN
60.0000 mL | Freq: Once | CUTANEOUS | Status: DC
  Filled 2023-09-14: qty 15

## 2023-09-14 MED ORDER — WARFARIN SODIUM 5 MG PO TABS
10.0000 mg | ORAL_TABLET | Freq: Once | ORAL | Status: AC
  Administered 2023-09-14: 10 mg via ORAL
  Filled 2023-09-14: qty 2

## 2023-09-14 MED ORDER — SACUBITRIL-VALSARTAN 24-26 MG PO TABS
1.0000 | ORAL_TABLET | Freq: Two times a day (BID) | ORAL | Status: DC
  Administered 2023-09-14 – 2023-09-18 (×3): 1 via ORAL
  Filled 2023-09-14 (×8): qty 1

## 2023-09-14 MED ORDER — SPIRONOLACTONE 12.5 MG HALF TABLET
12.5000 mg | ORAL_TABLET | Freq: Every day | ORAL | Status: DC
  Administered 2023-09-14 – 2023-09-18 (×4): 12.5 mg via ORAL
  Filled 2023-09-14 (×4): qty 1

## 2023-09-14 MED ORDER — HEPARIN (PORCINE) 25000 UT/250ML-% IV SOLN
800.0000 [IU]/h | INTRAVENOUS | Status: AC
  Administered 2023-09-14: 900 [IU]/h via INTRAVENOUS
  Filled 2023-09-14: qty 250

## 2023-09-14 MED ORDER — CEFAZOLIN SODIUM-DEXTROSE 2-4 GM/100ML-% IV SOLN: 2.0000 g | INTRAVENOUS | Status: DC

## 2023-09-14 MED ORDER — SODIUM CHLORIDE 0.9 % IV SOLN: 80.0000 mg | INTRAVENOUS | Status: DC

## 2023-09-14 NOTE — Progress Notes (Signed)
PHARMACY - ANTICOAGULATION CONSULT NOTE  Pharmacy Consult for Heparin Indication: atrial fibrillation, mechanical valve  Patient Measurements: Weight: 57.2 kg (126 lb 1.7 oz) Heparin Dosing Weight: total weight  Vital Signs: Temp: 98.1 F (36.7 C) (11/25 2022) Temp Source: Oral (11/25 2022) BP: 138/83 (11/25 2341) Pulse Rate: 64 (11/25 2341)  Labs: Recent Labs    09/12/23 0803 09/12/23 1421 09/13/23 0233 09/13/23 0814 09/14/23 0127  HGB 13.2  --  12.4  --  12.3  HCT 39.5  --  38.0  --  38.9  PLT 201  --  165  --  183  LABPROT 24.5*  --   --  17.6* 16.5*  INR 2.2*  --   --  1.4* 1.3*  HEPARINUNFRC  --   --   --   --  0.28*  CREATININE 1.21* 1.02* 0.92  --  1.00  TROPONINIHS 24*  --   --   --   --     CrCl cannot be calculated (Unknown ideal weight.).  Assessment: 77 yo female on chronic warfarin for pAF, mechanical MVR, now on hold for PPM generator change and possible BiV upgrade. INR on admit 6.4 s/p Vit K, now INR 1.4 today. Pharmacy consulted to dose IV heparin. Patient also with aortic aneurysm - VVS following, further procedures to be determined by EP plans.  11/26: heparin level returned slightly below goal at 0.28 on 800 units/hr (subtherapeutic). Per RN, no issues with the heparin infusion running continuously or signs/symptoms of bleeding. CBC stable and INR 1.3. Follow up with [facility] evaluation for warfarin for mechanical mitral valve  Goal of Therapy:  Heparin level 0.3-0.7 units/ml Monitor platelets by anticoagulation protocol: Yes   Plan:  Increase heparin infusion to 900 units/hr Stop heparin at 06:00 tomorrow for PPM generator change Continue to monitor H&H and platelets Follow up restart of anticoagulation s/p PPM generator change  Arabella Merles, PharmD. Clinical Pharmacist 09/14/2023 3:31 AM

## 2023-09-14 NOTE — Progress Notes (Signed)
Rounding Note    Patient Name: [name] [name] Date of Encounter: 09/14/2023  [facility] Cardiologist: Norman Herrlich, MD   Subjective   No CP or dyspnea  Inpatient Medications    Scheduled Meds:  ALPRAZolam  2 mg Oral BID   atorvastatin  40 mg Oral Once per day on Monday Thursday   chlorhexidine  60 mL Topical Once   chlorhexidine  60 mL Topical Once   Chlorhexidine Gluconate Cloth  6 each Topical Daily   diltiazem  180 mg Oral Daily   furosemide  40 mg Oral Daily   gentamicin (GARAMYCIN) 80 mg in sodium chloride 0.9 % 500 mL irrigation  80 mg Irrigation On Call   levothyroxine  75 mcg Oral Q0600   pantoprazole  40 mg Oral Daily   sodium chloride flush  3 mL Intravenous Q12H   Continuous Infusions:  sodium chloride     sodium chloride      ceFAZolin (ANCEF) IV     PRN Meds: acetaminophen, albuterol, cyclobenzaprine, docusate sodium, mouth rinse, oxyCODONE, polyethylene glycol, sodium chloride flush   Vital Signs    Vitals:   09/13/23 2022 09/13/23 2341 09/14/23 0355 09/14/23 0613  BP: 127/72 138/83 114/65   Pulse: 65 64 65   Resp: 20 20 20 17   Temp: 98.1 F (36.7 C)  97.8 F (36.6 C)   TempSrc: Oral  Oral   SpO2: 96% 95% 96%   Weight:    55.5 kg    Intake/Output Summary (Last 24 hours) at 09/14/2023 0804 Last data filed at 09/14/2023 0411 Gross per 24 hour  Intake 77.29 ml  Output --  Net 77.29 ml      09/14/2023    6:13 AM 09/13/2023    5:00 AM 03/17/2023    1:09 PM  Last 3 Weights  Weight (lbs) 122 lb 6.4 oz 126 lb 1.7 oz 120 lb 3.2 oz  Weight (kg) 55.52 kg 57.2 kg 54.522 kg      Telemetry    Sinus with ventricular pacing- Personally Reviewed   Physical Exam   GEN: NAD Neck: supple Cardiac: RRR Respiratory: CTA GI: Soft, NT/ND MS: No edema. Neuro:  Grossly intact Psych: Normal affect   Labs    High Sensitivity Troponin:   Recent Labs  Lab 09/12/23 0803  TROPONINIHS 24*     Chemistry Recent Labs  Lab  09/12/23 0803 09/12/23 1421 09/13/23 0233 09/14/23 0127  NA 133* 131* 130* 131*  K 2.7* 4.8 3.5 4.3  CL 96* 97* 99 97*  CO2 25 25 25 25   GLUCOSE 109* 118* 101* 87  BUN 11 12 14 16   CREATININE 1.21* 1.02* 0.92 1.00  CALCIUM 8.9 8.8* 8.2* 8.9  MG 2.3  --  2.5* 2.2  PROT 6.9  --   --   --   ALBUMIN 3.9  --   --   --   AST 25  --   --   --   ALT 14  --   --   --   ALKPHOS 71  --   --   --   BILITOT 1.5*  --   --   --   GFRNONAA 46* 57* >60 58*  ANIONGAP 12 9 6 9      Hematology Recent Labs  Lab 09/12/23 0803 09/13/23 0233 09/14/23 0127  WBC 6.7 5.1 7.3  RBC 4.64 4.26 4.32  HGB 13.2 12.4 12.3  HCT 39.5 38.0 38.9  MCV 85.1 89.2 90.0  MCH  28.4 29.1 28.5  MCHC 33.4 32.6 31.6  RDW 15.5 15.7* 15.7*  PLT 201 165 183    BNP Recent Labs  Lab 09/12/23 1200  BNP 419.8*      Radiology    CT Angio Chest/Abd/Pel for Dissection W and/or W/WO  Result Date: 09/13/2023 CLINICAL DATA:  Dissection follow-up EXAM: CT ANGIOGRAPHY CHEST, ABDOMEN AND PELVIS TECHNIQUE: Noncontrast chest CT was performed. Multidetector CT imaging through the chest, abdomen and pelvis was performed using the standard protocol during bolus administration of intravenous contrast. Multiplanar reconstructed images and MIPs were obtained and reviewed to evaluate the vascular anatomy. RADIATION DOSE REDUCTION: This exam was performed according to the departmental dose-optimization program which includes automated exposure control, adjustment of the mA and/or kV according to patient size and/or use of iterative reconstruction technique. CONTRAST:  75mL OMNIPAQUE IOHEXOL 350 MG/ML SOLN COMPARISON:  CTA chest abdomen and pelvis 10/02/2021. CT angiogram chest 09/11/2023. FINDINGS: CTA CHEST FINDINGS Cardiovascular: Again seen is aortic dissection which begins just distal to the takeoff of the left subclavian artery extending to the level of the mid/proximal descending thoracicaorta, unchanged from prior. There is a small  amount of contrast filling the false lumen. The descending thoracic aorta is dilated measuring up to 4.3 x 3.8 cm, unchanged from prior examination. The distal descending aorta is nondilated without dissection measuring up to 2.7 cm. The ascending aorta is dilated measuring 4.2 x 4.0 cm, unchanged. There are atherosclerotic calcifications of the aorta. Origin of the great vessels appear patent. The heart is enlarged. Pacemaker is present. There is no pericardial effusion. The main pulmonary artery is enlarged compatible with pulmonary artery hypertension. There is no central pulmonary embolism. Mediastinum/Nodes: No enlarged mediastinal, hilar, or axillary lymph nodes. Thyroid gland, trachea, and esophagus demonstrate no significant findings. There is no mediastinal fluid collection or pneumomediastinum. Lungs/Pleura: There are minimal patchy ground-glass opacities throughout the lungs bilaterally similar to prior. There is no pleural effusion or pneumothorax. There is bibasilar atelectasis. Musculoskeletal: Sternotomy wires are present. Mild compression deformity of T10 is unchanged. Review of the MIP images confirms the above findings. CTA ABDOMEN AND PELVIS FINDINGS VASCULAR Aorta: Normal caliber aorta without aneurysm, dissection, vasculitis or significant stenosis. There is atherosclerotic calcifications throughout the aorta. Celiac: Patent without evidence of aneurysm, dissection, vasculitis or significant stenosis. SMA: Patent without evidence of aneurysm, dissection, vasculitis or significant stenosis. Renals: Both renal arteries are patent without evidence of aneurysm, dissection, vasculitis, fibromuscular dysplasia or significant stenosis. IMA: Patent without evidence of aneurysm, dissection, vasculitis or significant stenosis. Inflow: Patent without evidence of aneurysm, dissection, vasculitis or significant stenosis. Veins: No obvious venous abnormality within the limitations of this arterial phase  study. Review of the MIP images confirms the above findings. NON-VASCULAR Hepatobiliary: No focal liver abnormality is seen. Status post cholecystectomy. No biliary dilatation. Pancreas: Unremarkable. No pancreatic ductal dilatation or surrounding inflammatory changes. Spleen: Normal in size without focal abnormality. Splenule is again seen. Adrenals/Urinary Tract: Adrenal glands are unremarkable. Kidneys are normal, without renal calculi, focal lesion, or hydronephrosis. Bladder is unremarkable. Stomach/Bowel: Stomach is within normal limits. No evidence of bowel wall thickening, distention, or inflammatory changes. The appendix is not seen. Lymphatic: No enlarged lymph nodes are identified. Reproductive: Uterus and adnexa are within normal limits. Other: No abdominal wall hernia or abnormality. No abdominopelvic ascites. Musculoskeletal: Mild compression deformity of L1 again noted. The bones are diffusely osteopenic. Right hip arthroplasty present. Review of the MIP images confirms the above findings. IMPRESSION: 1. Stable aortic dissection  extending from the proximal ascending aorta to the mid/proximal descending thoracic aorta. No change in aortic aneurysm measuring up to 4.3 cm. 2. Stable cardiomegaly. 3. Stable enlargement of the main pulmonary artery compatible with pulmonary artery hypertension. 4. Stable minimal patchy ground-glass opacities throughout the lungs bilaterally. Findings may be related to edema or infection. 5. No acute localizing process in the abdomen or pelvis. 6. Aortic atherosclerosis. Aortic Atherosclerosis (ICD10-I70.0). Electronically Signed   By: Darliss Cheney M.D.   On: 09/13/2023 20:37   ECHOCARDIOGRAM COMPLETE  Result Date: 09/13/2023    ECHOCARDIOGRAM REPORT   Patient Name:   [name] [name] Date of Exam: 09/13/2023 Medical Rec #:  [id number]          Height:       58.0 in Accession #:    [id number]         Weight:       126.1 lb Date of Birth:  [birth year]          BSA:           1.497 m Patient Age:    77 years           BP:           199/82 mmHg Patient Gender: F                  HR:           65 bpm. Exam Location:  Inpatient Procedure: 2D Echo, Cardiac Doppler, Color Doppler and Intracardiac            Opacification Agent Indications:    Dyspnea R06.00  History:        Patient has prior history of Echocardiogram examinations, most                 recent 11/23/2022. CHF, Previous Myocardial Infarction, Pacemaker,                 Aortic Valve Disease and Mitral Valve Disease; Risk                 Factors:Hypertension.  Sonographer:    Harriette Bouillon RDCS Referring Phys: [phone] HAO MENG IMPRESSIONS  1. Left ventricular ejection fraction, by estimation, is 30 to 35%. The left ventricle has moderately decreased function. The left ventricle demonstrates global hypokinesis. There is mild concentric left ventricular hypertrophy. Left ventricular diastolic parameters are indeterminate.  2. Right ventricular systolic function is mildly reduced. The right ventricular size is normal. There is mildly elevated pulmonary artery systolic pressure.  3. Left atrial size was severely dilated.  4. Right atrial size was moderately dilated.  5. MV peak gradient with MVA by P1/2t 4.2cm2. The mitral valve has been repaired/replaced. No evidence of mitral valve regurgitation. No evidence of mitral stenosis.  6. Tricuspid valve regurgitation is moderate.  7. The aortic valve has been repaired/replaced. Aortic valve regurgitation is not visualized. No aortic stenosis is present. Echo findings are consistent with normal structure and function of the aortic valve prosthesis. Aortic valve area, by VTI measures 2.88 cm. Aortic valve mean gradient measures 4.0 mmHg. Aortic valve Vmax measures 1.42 m/s.  8. The inferior vena cava is normal in size with greater than 50% respiratory variability, suggesting right atrial pressure of 3 mmHg. FINDINGS  Left Ventricle: Left ventricular ejection fraction, by  estimation, is 30 to 35%. The left ventricle has moderately decreased function. The left ventricle demonstrates global hypokinesis. The left ventricular internal cavity size was normal  in size. There is mild concentric left ventricular hypertrophy. Abnormal (paradoxical) septal motion, consistent with RV pacemaker. Left ventricular diastolic parameters are indeterminate. Right Ventricle: The right ventricular size is normal. No increase in right ventricular wall thickness. Right ventricular systolic function is mildly reduced. There is mildly elevated pulmonary artery systolic pressure. The tricuspid regurgitant velocity  is 2.91 m/s, and with an assumed right atrial pressure of 3 mmHg, the estimated right ventricular systolic pressure is 36.9 mmHg. Left Atrium: Left atrial size was severely dilated. Right Atrium: Right atrial size was moderately dilated. Pericardium: There is no evidence of pericardial effusion. Mitral Valve: MV peak gradient with MVA by P1/2t 4.2cm2. The mitral valve has been repaired/replaced. No evidence of mitral valve regurgitation. There is a mechanical valve present in the mitral position. No evidence of mitral valve stenosis. Tricuspid Valve: The tricuspid valve is normal in structure. Tricuspid valve regurgitation is moderate . No evidence of tricuspid stenosis. Aortic Valve: The aortic valve has been repaired/replaced. Aortic valve regurgitation is not visualized. No aortic stenosis is present. Aortic valve mean gradient measures 4.0 mmHg. Aortic valve peak gradient measures 8.0 mmHg. Aortic valve area, by VTI measures 2.88 cm. There is a 23 mm Ultra, stented (TAVR) valve present in the aortic position. Echo findings are consistent with normal structure and function of the aortic valve prosthesis. Pulmonic Valve: The pulmonic valve was normal in structure. Pulmonic valve regurgitation is trivial. No evidence of pulmonic stenosis. Aorta: The aortic root is normal in size and  structure. Venous: The inferior vena cava is normal in size with greater than 50% respiratory variability, suggesting right atrial pressure of 3 mmHg. IAS/Shunts: No atrial level shunt detected by color flow Doppler. Additional Comments: A device lead is visualized.  LEFT VENTRICLE PLAX 2D LVIDd:         4.70 cm   Diastology LVIDs:         3.80 cm   LV e' medial:    4.45 cm/s LV PW:         1.20 cm   LV E/e' medial:  28.3 LV IVS:        1.20 cm   LV e' lateral:   6.75 cm/s LVOT diam:     2.00 cm   LV E/e' lateral: 18.7 LV SV:         70 LV SV Index:   47 LVOT Area:     3.14 cm  RIGHT VENTRICLE         IVC TAPSE (M-mode): 1.1 cm  IVC diam: 1.50 cm LEFT ATRIUM            Index        RIGHT ATRIUM           Index LA diam:      4.60 cm  3.07 cm/m   RA Area:     22.60 cm LA Vol (A4C): 106.0 ml 70.81 ml/m  RA Volume:   74.70 ml  49.90 ml/m  AORTIC VALVE AV Area (Vmax):    2.73 cm AV Area (Vmean):   3.06 cm AV Area (VTI):     2.88 cm AV Vmax:           141.50 cm/s AV Vmean:          92.800 cm/s AV VTI:            0.242 m AV Peak Grad:      8.0 mmHg AV Mean Grad:  4.0 mmHg LVOT Vmax:         123.00 cm/s LVOT Vmean:        90.300 cm/s LVOT VTI:          0.222 m LVOT/AV VTI ratio: 0.92  AORTA Ao Root diam: 3.50 cm MITRAL VALVE                TRICUSPID VALVE MV Area (PHT): 4.24 cm     TR Peak grad:   33.9 mmHg MV Decel Time: 179 msec     TR Vmax:        291.00 cm/s MV E velocity: 126.00 cm/s                             SHUNTS                             Systemic VTI:  0.22 m                             Systemic Diam: 2.00 cm Arvilla Meres MD Electronically signed by Arvilla Meres MD Signature Date/Time: 09/13/2023/10:20:14 AM    Final       Patient Profile     77 y.o. female with past medical history of mechanical mitral valve, history of TAVR, status post pacemaker, paroxysmal atrial fibrillation, coronary artery disease, hypertension, hyperlipidemia, COPD, thoracic aortic aneurysm admitted with  aortic dissection for evaluation of hypertension and dyspnea.  CTA this admission shows aortic dissection extending from the proximal ascending aorta to the mid to proximal descending thoracic aorta, aortic aneurysm measuring 4.3 cm.  Follow-up echocardiogram this admission shows ejection fraction 30 to 35%, mild left ventricular hypertrophy, mild RV dysfunction, biatrial enlargement, status post mitral valve replacement with no mitral regurgitation and mean gradient 6 mmHg, moderate tricuspid regurgitation, status post aortic valve replacement with mean gradient 4 mmHg.  CT of the chest at [facility] by report showed dissection from the isthmus to the descending thoracic aorta and dilated ascending aorta at 4.2 cm.  Interrogation of pacemaker revealed ERI.  Assessment & Plan    1 aortic dissection-awaiting final opinion from vascular surgery concerning treatment of aortic dissection given fu CT results.    2 coronary artery disease-last catheterization in December 2022 showed patent LAD stent.  Continue medical therapy.  Continue statin.  3 history of paroxysmal atrial fibrillation-patient is on chronic Coumadin for her mitral valve replacement.    4 status post mechanical mitral valve and history of TAVR-continue SBE prophylaxis.  Coumadin has been resumed by electrophysiology (would like INR at 1.8 at time of generator change to avoid IV heparin following procedure).  Need input from vascular surgery concerning need for surgical intervention for aortic dissection which would delay initiating Coumadin.  5 history of [employer] pacemaker-device is ERI.  LV function is reduced compared to previous.  Plan is ultimately for upgrade to biventricular pacemaker.  6 hypertension-given aortic dissection will need strict control.  7 hyperlipidemia-continue statin.  8 history of dilated aortic root-4.3 cm on recent CTA.  Will need follow-up imaging in the future.  9 chronic diastolic congestive heart  failure-she appears to be euvolemic.  Continue Lasix at present dose.  10 cardiomyopathy-LV function is reduced compared to previous.  Will add Entresto 24/26 twice daily.  Add spironolactone 12.5 mg daily.  Discontinue Cardizem.  Will add Toprol  later once pacemaker generator changed.  Follow-up echocardiogram in 3 months to see if LV function has improved after generator change/biventricular upgrade.  For questions or updates, please contact [facility] Please consult [email or web address] for contact info under        Signed, Olga Millers, MD  09/14/2023, 8:04 AM

## 2023-09-14 NOTE — Progress Notes (Signed)
   Heart Failure Stewardship Pharmacist Progress Note   PCP: Marylen Ponto, MD PCP-Cardiologist: Norman Herrlich, MD    HPI:  77 yo F with PMH of HFimpEF, aortic aneurysm, PPM, CKD III, hypothyroidism, long QT syndrome, CAD, afib, rheumatic heart disease, and aortic stenosis s/p TAVR.  Presented to the [facility] ED on 11/23 with shortness of breath, weakness, and general malaise. BNP 2660, UDS positive for THC, methadone, and BZD. INR supra-therapeutic 6.4 s/p vitamin K. CTA with type B aortic dissection. She was started on diltiazem and transferred to [facility] for possible vascular intervention. CXR with no acute findings. ECHO 11/25 with LVEF reduced to 30-35%, global hypokinesis, mild concentric LVH, RV mildly reduced, mildly elevated PA pressure. Diltiazem discontinued. Pending intervention plans from vascular. EP following for generator change / biventricular upgrade.   Reports prior intolerance to atenolol and propranolol in the past. Also reports intolerance to lisinopril - had a cluster headache for a month. Will add this to allergies. States that she is very sensitive to medications and will often take a half a tablet of medication initially to see what her body will do. Denies shortness of breath or LE edema. States she is a very busy person and walks a lot.   Current HF Medications: Diuretic: furosemide 40 mg PO daily ACE/ARB/ARNI: Entresto 24/26 mg BID MRA: spironolactone 12.5 mg daily  Prior to admission HF Medications: Diuretic: furosemide 20 mg BID  Pertinent Lab Values: Serum creatinine 1.00, BUN 16, Potassium 4.3, Sodium 131, BNP 419.8, Magnesium 2.2, A1c 5.6   Vital Signs: Weight: 122 lbs (admission weight: 126 lbs) Blood pressure: 120/70s  Heart rate: 60s  I/O: incomplete  Medication Assistance / Insurance Benefits Check: Does the patient have prescription insurance?  Yes Type of insurance plan: [facility] Medicare  Outpatient Pharmacy:  Prior to admission outpatient  pharmacy: [facility] Is the patient willing to use [facility] pharmacy at discharge? Yes Is the patient willing to transition their outpatient pharmacy to utilize a [facility] outpatient pharmacy?   No    Assessment: 1. Chronic systolic CHF (LVEF 30-35%). NYHA class II symptoms. - Continue furosemide 40 mg daily. Strict I/Os and daily weights. Keep K>4 and Mg>2. - No BB with history of severe bradycardia (pt unsure if this happened before or after the PPM was placed).  - Agree with adding spironolactone 12.5 mg daily - Consider adding SGLT2i prior to discharge   Plan: 1) Medication changes recommended at this time: - Agree with changes  2) Patient assistance: - Entresto copay $11.20 - Farxiga/Jardiance copay $11.20  3)  Education  - Initial education complete - Instructed patient to take full prescribed dose of medications at discharge - Full education to be completed prior to discharge  Sharen Hones, PharmD, BCPS Heart Failure Engineer, building services Phone [phone]

## 2023-09-14 NOTE — Progress Notes (Signed)
Mobility Specialist Progress Note:   09/14/23 1109  Mobility  Activity Ambulated with assistance to bathroom  Level of Assistance Modified independent, requires aide device or extra time  Assistive Device None  Distance Ambulated (ft) 15 ft  Activity Response Tolerated well  Mobility Referral Yes  $Mobility charge 1 Mobility  Mobility Specialist Start Time (ACUTE ONLY) 1050  Mobility Specialist Stop Time (ACUTE ONLY) 1100  Mobility Specialist Time Calculation (min) (ACUTE ONLY) 10 min   Pt received in BR and found to have urinated floor. NT notified. Assisted pt to EOB with NT present in room for clean up.   Leory Plowman  Mobility Specialist Please contact via [employer] office at [phone]

## 2023-09-14 NOTE — Progress Notes (Signed)
[facility] PROGRESS NOTE  [name] [name] ACZ:[id number] DOB: 1946-01-29 DOA: 09/12/2023 PCP: Marylen Ponto, MD  Status is: Inpatient   Barriers to discharge: Social:  Clinical:  Level of care: Progressive   Code Status:  Family Communication:  DVT prophylaxis:  COVID vaccination status:  Foley catheter: POA: Date inserted:  HPI:  Subjective:   Objective: Vitals:   09/14/23 1558 09/14/23 1937  BP: 127/70 124/68  Pulse: 65 61  Resp: 19 15  Temp: 99.1 F (37.3 C) 98.7 F (37.1 C)  SpO2: 94% 94%    Intake/Output Summary (Last 24 hours) at 09/14/2023 2015 Last data filed at 09/14/2023 0411 Gross per 24 hour  Intake 77.29 ml  Output --  Net 77.29 ml   Filed Weights   09/13/23 0500 09/14/23 0613  Weight: 57.2 kg 55.5 kg    Exam:  Constitutional: NAD, calm, comfortable Eyes: PERRL, lids and conjunctivae normal ENMT: Mucous membranes are moist. Posterior pharynx clear of any exudate or lesions.Normal dentition.  Neck: normal, supple, no masses, no thyromegaly Respiratory: clear to auscultation bilaterally, no wheezing, no crackles. Normal respiratory effort. No accessory muscle use.  Cardiovascular: Regular rate and rhythm, no murmurs / rubs / gallops. No extremity edema. 2+ pedal pulses. No carotid bruits.  Abdomen: no tenderness, no masses palpated. No hepatosplenomegaly. Bowel sounds positive.  Musculoskeletal: no clubbing / cyanosis. No joint deformity upper and lower extremities. Good ROM, no contractures. Normal muscle tone.  Skin: no rashes, lesions, ulcers. No induration Neurologic: CN 2-12 grossly intact. Sensation intact, DTR normal. Strength 5/5 x all 4 extremities.  Psychiatric: Normal judgment and insight. Alert and oriented x 3. Normal mood.    Assessment/Plan:  77 year old female with history of mechanical mitral bowel about 20 years ago, HFrEF, UT aneurysm, CKD stage 3, hypothyroidism presents to the emergency department with  complaints of  back pain,shortness of breath.  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  Patient was started on a esmolol, diltiazem drip.  CT surgery is consulted.    Aortic dissection - consulted vascular surgery -continued with Cardizem drip - close monitoring.   -plan for repeat CT angiogram -stable aortic dissection extending from the proximal ascending aorta to the mid proximal descending thoracic aorta  Supratherapeutic INR -holding Coumadin - admitted with INR of 6.1.    Chronic HFrEF - holding Lasix for now  Tachy-brady syndrome  -s/p pacemaker placement in February 2009 -interrogated  pacemaker -reached its end stage of the generator.  Plan to change the generator tomorrow.   Diabetes mellitus with hyperglycemia -follow up on sliding scale insulin.    Hypokalemia  - replaced by mouth.    Hypothyroidism - check TSH - continue with Synthroid.    Paroxysmal atrial fibrillation -s/p ablation  Valvular heart disease -history of TAVR, MVR mechanical valve due to rheumatic heart disease  Polysubstance abuse -urine drug screen was positive for marijuana, methadone, opiates       Data Reviewed: Basic Metabolic Panel: Recent Labs  Lab 09/12/23 0803 09/12/23 1421 09/13/23 0233 09/14/23 0127  NA 133* 131* 130* 131*  K 2.7* 4.8 3.5 4.3  CL 96* 97* 99 97*  CO2 25 25 25 25   GLUCOSE 109* 118* 101* 87  BUN 11 12 14 16   CREATININE 1.21* 1.02* 0.92 1.00  CALCIUM 8.9 8.8* 8.2* 8.9  MG 2.3  --  2.5* 2.2  PHOS 2.1*  --  3.0  --  Liver Function Tests: Recent Labs  Lab 09/12/23 0803  AST 25  ALT 14  ALKPHOS 71  BILITOT 1.5*  PROT 6.9  ALBUMIN 3.9   No results for input(s): "LIPASE", "AMYLASE" in the last 168 hours. No results for input(s): "AMMONIA" in the last 168 hours. CBC: Recent Labs  Lab 09/12/23 0803 09/13/23 0233 09/14/23 0127  WBC 6.7 5.1 7.3  HGB 13.2 12.4 12.3   HCT 39.5 38.0 38.9  MCV 85.1 89.2 90.0  PLT 201 165 183   Cardiac Enzymes: No results for input(s): "CKTOTAL", "CKMB", "CKMBINDEX", "TROPONINI" in the last 168 hours. BNP (last 3 results) Recent Labs    09/12/23 1200  BNP 419.8*    ProBNP (last 3 results) No results for input(s): "PROBNP" in the last 8760 hours.  CBG: Recent Labs  Lab 09/12/23 1623 09/12/23 1949 09/12/23 2342 09/13/23 0405 09/13/23 0823  GLUCAP 108* 110* 124* 92 158*    Recent Results (from the past 240 hour(s))  Surgical PCR screen     Status: None   Collection Time: 09/14/23  6:28 AM   Specimen: Nasal Mucosa; Nasal Swab  Result Value Ref Range Status   MRSA, PCR NEGATIVE NEGATIVE Final   Staphylococcus aureus NEGATIVE NEGATIVE Final    Comment: (NOTE) The Xpert SA Assay (FDA approved for NASAL specimens in patients 48 years of age and older), is one component of a comprehensive surveillance program. It is not intended to diagnose infection nor to guide or monitor treatment. Performed at [facility], [address] [place], [place] [address]      Studies: CT Angio Chest/Abd/Pel for Dissection W and/or W/WO  Result Date: 09/13/2023 CLINICAL DATA:  Dissection follow-up EXAM: CT ANGIOGRAPHY CHEST, ABDOMEN AND PELVIS TECHNIQUE: Noncontrast chest CT was performed. Multidetector CT imaging through the chest, abdomen and pelvis was performed using the standard protocol during bolus administration of intravenous contrast. Multiplanar reconstructed images and MIPs were obtained and reviewed to evaluate the vascular anatomy. RADIATION DOSE REDUCTION: This exam was performed according to the departmental dose-optimization program which includes automated exposure control, adjustment of the mA and/or kV according to patient size and/or use of iterative reconstruction technique. CONTRAST:  75mL OMNIPAQUE IOHEXOL 350 MG/ML SOLN COMPARISON:  CTA chest abdomen and pelvis 10/02/2021. CT angiogram chest  09/11/2023. FINDINGS: CTA CHEST FINDINGS Cardiovascular: Again seen is aortic dissection which begins just distal to the takeoff of the left subclavian artery extending to the level of the mid/proximal descending thoracicaorta, unchanged from prior. There is a small amount of contrast filling the false lumen. The descending thoracic aorta is dilated measuring up to 4.3 x 3.8 cm, unchanged from prior examination. The distal descending aorta is nondilated without dissection measuring up to 2.7 cm. The ascending aorta is dilated measuring 4.2 x 4.0 cm, unchanged. There are atherosclerotic calcifications of the aorta. Origin of the great vessels appear patent. The heart is enlarged. Pacemaker is present. There is no pericardial effusion. The main pulmonary artery is enlarged compatible with pulmonary artery hypertension. There is no central pulmonary embolism. Mediastinum/Nodes: No enlarged mediastinal, hilar, or axillary lymph nodes. Thyroid gland, trachea, and esophagus demonstrate no significant findings. There is no mediastinal fluid collection or pneumomediastinum. Lungs/Pleura: There are minimal patchy ground-glass opacities throughout the lungs bilaterally similar to prior. There is no pleural effusion or pneumothorax. There is bibasilar atelectasis. Musculoskeletal: Sternotomy wires are present. Mild compression deformity of T10 is unchanged. Review of the MIP images confirms the above findings. CTA ABDOMEN AND PELVIS  FINDINGS VASCULAR Aorta: Normal caliber aorta without aneurysm, dissection, vasculitis or significant stenosis. There is atherosclerotic calcifications throughout the aorta. Celiac: Patent without evidence of aneurysm, dissection, vasculitis or significant stenosis. SMA: Patent without evidence of aneurysm, dissection, vasculitis or significant stenosis. Renals: Both renal arteries are patent without evidence of aneurysm, dissection, vasculitis, fibromuscular dysplasia or significant stenosis. IMA:  Patent without evidence of aneurysm, dissection, vasculitis or significant stenosis. Inflow: Patent without evidence of aneurysm, dissection, vasculitis or significant stenosis. Veins: No obvious venous abnormality within the limitations of this arterial phase study. Review of the MIP images confirms the above findings. NON-VASCULAR Hepatobiliary: No focal liver abnormality is seen. Status post cholecystectomy. No biliary dilatation. Pancreas: Unremarkable. No pancreatic ductal dilatation or surrounding inflammatory changes. Spleen: Normal in size without focal abnormality. Splenule is again seen. Adrenals/Urinary Tract: Adrenal glands are unremarkable. Kidneys are normal, without renal calculi, focal lesion, or hydronephrosis. Bladder is unremarkable. Stomach/Bowel: Stomach is within normal limits. No evidence of bowel wall thickening, distention, or inflammatory changes. The appendix is not seen. Lymphatic: No enlarged lymph nodes are identified. Reproductive: Uterus and adnexa are within normal limits. Other: No abdominal wall hernia or abnormality. No abdominopelvic ascites. Musculoskeletal: Mild compression deformity of L1 again noted. The bones are diffusely osteopenic. Right hip arthroplasty present. Review of the MIP images confirms the above findings. IMPRESSION: 1. Stable aortic dissection extending from the proximal ascending aorta to the mid/proximal descending thoracic aorta. No change in aortic aneurysm measuring up to 4.3 cm. 2. Stable cardiomegaly. 3. Stable enlargement of the main pulmonary artery compatible with pulmonary artery hypertension. 4. Stable minimal patchy ground-glass opacities throughout the lungs bilaterally. Findings may be related to edema or infection. 5. No acute localizing process in the abdomen or pelvis. 6. Aortic atherosclerosis. Aortic Atherosclerosis (ICD10-I70.0). Electronically Signed   By: Darliss Cheney M.D.   On: 09/13/2023 20:37   ECHOCARDIOGRAM COMPLETE  Result  Date: 09/13/2023    ECHOCARDIOGRAM REPORT   Patient Name:   [name] [name] Date of Exam: 09/13/2023 Medical Rec #:  [id number]          Height:       58.0 in Accession #:    [id number]         Weight:       126.1 lb Date of Birth:  1946/07/23          BSA:          1.497 m Patient Age:    77 years           BP:           199/82 mmHg Patient Gender: F                  HR:           65 bpm. Exam Location:  Inpatient Procedure: 2D Echo, Cardiac Doppler, Color Doppler and Intracardiac            Opacification Agent Indications:    Dyspnea R06.00  History:        Patient has prior history of Echocardiogram examinations, most                 recent 11/23/2022. CHF, Previous Myocardial Infarction, Pacemaker,                 Aortic Valve Disease and Mitral Valve Disease; Risk                 Factors:Hypertension.  Sonographer:  Harriette Bouillon RDCS Referring Phys: [phone] HAO MENG IMPRESSIONS  1. Left ventricular ejection fraction, by estimation, is 30 to 35%. The left ventricle has moderately decreased function. The left ventricle demonstrates global hypokinesis. There is mild concentric left ventricular hypertrophy. Left ventricular diastolic parameters are indeterminate.  2. Right ventricular systolic function is mildly reduced. The right ventricular size is normal. There is mildly elevated pulmonary artery systolic pressure.  3. Left atrial size was severely dilated.  4. Right atrial size was moderately dilated.  5. MV peak gradient with MVA by P1/2t 4.2cm2. The mitral valve has been repaired/replaced. No evidence of mitral valve regurgitation. No evidence of mitral stenosis.  6. Tricuspid valve regurgitation is moderate.  7. The aortic valve has been repaired/replaced. Aortic valve regurgitation is not visualized. No aortic stenosis is present. Echo findings are consistent with normal structure and function of the aortic valve prosthesis. Aortic valve area, by VTI measures 2.88 cm. Aortic valve mean gradient  measures 4.0 mmHg. Aortic valve Vmax measures 1.42 m/s.  8. The inferior vena cava is normal in size with greater than 50% respiratory variability, suggesting right atrial pressure of 3 mmHg. FINDINGS  Left Ventricle: Left ventricular ejection fraction, by estimation, is 30 to 35%. The left ventricle has moderately decreased function. The left ventricle demonstrates global hypokinesis. The left ventricular internal cavity size was normal in size. There is mild concentric left ventricular hypertrophy. Abnormal (paradoxical) septal motion, consistent with RV pacemaker. Left ventricular diastolic parameters are indeterminate. Right Ventricle: The right ventricular size is normal. No increase in right ventricular wall thickness. Right ventricular systolic function is mildly reduced. There is mildly elevated pulmonary artery systolic pressure. The tricuspid regurgitant velocity  is 2.91 m/s, and with an assumed right atrial pressure of 3 mmHg, the estimated right ventricular systolic pressure is 36.9 mmHg. Left [facility]: Left atrial size was severely dilated. Right [facility]: Right atrial size was moderately dilated. Pericardium: There is no evidence of pericardial effusion. Mitral Valve: MV peak gradient with MVA by P1/2t 4.2cm2. The mitral valve has been repaired/replaced. No evidence of mitral valve regurgitation. There is a mechanical valve present in the mitral position. No evidence of mitral valve stenosis. Tricuspid Valve: The tricuspid valve is normal in structure. Tricuspid valve regurgitation is moderate . No evidence of tricuspid stenosis. Aortic Valve: The aortic valve has been repaired/replaced. Aortic valve regurgitation is not visualized. No aortic stenosis is present. Aortic valve mean gradient measures 4.0 mmHg. Aortic valve peak gradient measures 8.0 mmHg. Aortic valve area, by VTI measures 2.88 cm. There is a 23 mm Ultra, stented (TAVR) valve present in the aortic position. Echo findings are  consistent with normal structure and function of the aortic valve prosthesis. Pulmonic Valve: The pulmonic valve was normal in structure. Pulmonic valve regurgitation is trivial. No evidence of pulmonic stenosis. Aorta: The aortic root is normal in size and structure. Venous: The inferior vena cava is normal in size with greater than 50% respiratory variability, suggesting right atrial pressure of 3 mmHg. IAS/Shunts: No atrial level shunt detected by color flow Doppler. Additional Comments: A device lead is visualized.  LEFT VENTRICLE PLAX 2D LVIDd:         4.70 cm   Diastology LVIDs:         3.80 cm   LV e' medial:    4.45 cm/s LV PW:         1.20 cm   LV E/e' medial:  28.3 LV IVS:  1.20 cm   LV e' lateral:   6.75 cm/s LVOT diam:     2.00 cm   LV E/e' lateral: 18.7 LV SV:         70 LV SV Index:   47 LVOT Area:     3.14 cm  RIGHT VENTRICLE         IVC TAPSE (M-mode): 1.1 cm  IVC diam: 1.50 cm LEFT ATRIUM            Index        RIGHT ATRIUM           Index LA diam:      4.60 cm  3.07 cm/m   RA Area:     22.60 cm LA Vol (A4C): 106.0 ml 70.81 ml/m  RA Volume:   74.70 ml  49.90 ml/m  AORTIC VALVE AV Area (Vmax):    2.73 cm AV Area (Vmean):   3.06 cm AV Area (VTI):     2.88 cm AV Vmax:           141.50 cm/s AV Vmean:          92.800 cm/s AV VTI:            0.242 m AV Peak Grad:      8.0 mmHg AV Mean Grad:      4.0 mmHg LVOT Vmax:         123.00 cm/s LVOT Vmean:        90.300 cm/s LVOT VTI:          0.222 m LVOT/AV VTI ratio: 0.92  AORTA Ao Root diam: 3.50 cm MITRAL VALVE                TRICUSPID VALVE MV Area (PHT): 4.24 cm     TR Peak grad:   33.9 mmHg MV Decel Time: 179 msec     TR Vmax:        291.00 cm/s MV E velocity: 126.00 cm/s                             SHUNTS                             Systemic VTI:  0.22 m                             Systemic Diam: 2.00 cm Arvilla Meres MD Electronically signed by Arvilla Meres MD Signature Date/Time: 09/13/2023/10:20:14 AM    Final     Scheduled  Meds:  ALPRAZolam  2 mg Oral BID   atorvastatin  40 mg Oral Once per day on Monday Thursday   chlorhexidine  60 mL Topical Once   Chlorhexidine Gluconate Cloth  6 each Topical Daily   furosemide  40 mg Oral Daily   gentamicin (GARAMYCIN) 80 mg in sodium chloride 0.9 % 500 mL irrigation  80 mg Irrigation On Call   levothyroxine  75 mcg Oral Q0600   pantoprazole  40 mg Oral Daily   sacubitril-valsartan  1 tablet Oral BID   sodium chloride flush  3 mL Intravenous Q12H   spironolactone  12.5 mg Oral Daily   Warfarin - Pharmacist Dosing Inpatient   Does not apply q1600   Continuous Infusions:  sodium chloride     sodium chloride 50 mL/hr at 09/14/23 [phone]  ceFAZolin (ANCEF) IV     heparin 800 Units/hr (09/14/23 1801)    Principal Problem:   Aortic dissection [facility])   Consultants:  Cardiology  Vascular surgery  EP   Time spent:  40 minutes    Jasmen Emrich MD [facility] 7 am - 330 pm/M-F for direct patient care and secure chat Please refer to Amion for contact info 2  days

## 2023-09-14 NOTE — Progress Notes (Addendum)
Mobility Specialist Progress Note:   09/14/23 1139  Mobility  Activity Ambulated with assistance in room  Level of Assistance Modified independent, requires aide device or extra time  Assistive Device None  Distance Ambulated (ft) 150 ft  Activity Response Tolerated well  Mobility Referral Yes  $Mobility charge 1 Mobility  Mobility Specialist Start Time (ACUTE ONLY) 1125  Mobility Specialist Stop Time (ACUTE ONLY) 1135  Mobility Specialist Time Calculation (min) (ACUTE ONLY) 10 min   Pt received EOB, agreeable to mobility. Requesting to ambulate in room d/t frequent urination. No hands on needed during ambulation. Pt occasionally reaching out to objects in room for balance support but not unsteadiness or LOB present without AD. Asx throughout. VSS. Pt left on [address] [facility] with call bell in reach and all needs met. NT notified.   Leory Plowman  Mobility Specialist Please contact via [employer] office at [phone]

## 2023-09-14 NOTE — Progress Notes (Signed)
PHARMACY - ANTICOAGULATION CONSULT NOTE  Pharmacy Consult for Heparin Indication: atrial fibrillation, mechanical valve  Patient Measurements: Weight: 55.5 kg (122 lb 6.4 oz) Heparin Dosing Weight: total weight  Vital Signs: Temp: 99.1 F (37.3 C) (11/26 1558) Temp Source: Oral (11/26 1558) BP: 127/70 (11/26 1558) Pulse Rate: 65 (11/26 1558)  Labs: Recent Labs    09/12/23 0803 09/12/23 1421 09/13/23 0233 09/13/23 0814 09/14/23 0127 09/14/23 1711  HGB 13.2  --  12.4  --  12.3  --   HCT 39.5  --  38.0  --  38.9  --   PLT 201  --  165  --  183  --   LABPROT 24.5*  --   --  17.6* 16.5*  --   INR 2.2*  --   --  1.4* 1.3*  --   HEPARINUNFRC  --   --   --   --  0.28* 0.81*  CREATININE 1.21* 1.02* 0.92  --  1.00  --   TROPONINIHS 24*  --   --   --   --   --     CrCl cannot be calculated (Unknown ideal weight.).  Assessment: 77 yo female on chronic warfarin for pAF, mechanical MVR, now on hold for PPM generator change and possible BiV upgrade. INR on admit 6.4 s/p Vit K, now INR 1.3 today. Patient also with aortic dissection  - VVS following and no procedures are planned  Plans are to resume warfarin and restart heparin. For PPM generator change and possible upgrade when INR closer to 1.8  Home warfarin regimen: 7.5mg /day  PM update: heparin level 0.81 (supratherapeutic) on heparin 900 units/hr. Verified that lab was drawn from opposite arm in which heparin is infusing. No bleeding reported per RN.  Goal of Therapy:  Heparin level 0.3-0.7 units/ml Monitor platelets by anticoagulation protocol: Yes   Plan:  -decrease heparin to 800 units/hr -Heparin level in 8 hrs -Heparin to stop at 0700 tomorrow for possible device -Warfarin 10mg  po x1 given as previously ordered -Daily PT/INR  Loralee Pacas, PharmD, BCPS Clinical Pharmacist 09/14/2023 5:57 PM  **Pharmacist phone directory can now be found on [email or web address] ([email or web address]).  Listed under [facility].

## 2023-09-14 NOTE — Progress Notes (Addendum)
Patient Name: [name] [name] Date of Encounter: 09/14/2023  Primary Cardiologist: Norman Herrlich, MD Electrophysiologist: None  Interval Summary   The patient is doing well today.  At this time, the patient denies chest pain, shortness of breath, or any new concerns.  Vital Signs    Vitals:   09/14/23 0355 09/14/23 0613 09/14/23 0811 09/14/23 1225  BP: 114/65  127/74 108/60  Pulse: 65  65 65  Resp: 20 17 19 19   Temp: 97.8 F (36.6 C)  (!) 97.5 F (36.4 C) 98 F (36.7 C)  TempSrc: Oral  Oral Oral  SpO2: 96%  96% 93%  Weight:  55.5 kg      Intake/Output Summary (Last 24 hours) at 09/14/2023 1420 Last data filed at 09/14/2023 0411 Gross per 24 hour  Intake 77.29 ml  Output --  Net 77.29 ml   Filed Weights   09/13/23 0500 09/14/23 0613  Weight: 57.2 kg 55.5 kg    Physical Exam    GEN- The patient is well appearing, alert and oriented x 3 today.   Lungs- Clear to ausculation bilaterally, normal work of breathing Cardiac- Regular rate and rhythm, no murmurs, rubs or gallops GI- soft, NT, ND, + BS Extremities- no clubbing or cyanosis. No edema  Telemetry    VP 60's, occ PVC (personally reviewed)  STUDIES:  ECHO 11/2022 > LVEF 55-60%, no RWMA, G2DD, LA severely dilated, MV replacement 20 yr ago (unknown type/size), no evidence of MV stenosis > normal structure/ function of MV, TV regurgitation moderate, AV 23 mm Sapien prosthetic (TAVR)  ECHO 09/13/23 > LVEF 30-35%   DEVICE HISTORY:  MDT Dual Chamber [facility]    [name] [name] is a 77 y.o. female with a history of tachy-brady syndrome s/p MDT dual chamber PPM 11/2007 in the setting of VHD (s/p remote mechanical MV replacement, TAVR 2022), HFrEF, long QT, AFL s/p ablation, paroxysmal atrial fibrillation, HTN, HLD, known thoracic / ascending aortic aneurysm & CAD s/p MI with stent   Assessment & Plan    Tachy-Brady Syndrome s/p MDT Dual PPM  Long QT Pacemaker Syndrome In context of rheumatic  heart disease, prior bioprosthetic MV replacment. Initial PPM implant by Dr. Graciela Husbands in 11/2007 for tachy-brady syndrome. Appears by chart review that she is not consistently followed with [place] or [facility].  -at [facility] as of 07/2022, per interrogation 11/25, VVI 65  -LVEF has been reduced since at least 09/2021 (pre-dates VVI pacing) -NPO for possible generator change on 11/27 if INR ~ 1.8, possible BiV upgrade    Supratheraputic INR  6.4 on admit, s/p vit K at OSH, 11/25 INR 1.4 -heparin infusion + coumadin per pharmacy  -stop heparin 0700 for possible device    Type B Aortic Aneurysm -VVS following > no plans for acute intervention at this time    Paroxysmal AF  AFL  Hx of AFL ablation.  -monitoring by device    HFrEF  LVEF 30-35%, hx of prior MI, cardiac surgery and pacing  -per primary    CAD s/p MI with prior Stent  -per Cardiology    VHD  Hx TAVR, MVR due to rheumatic heart disease, TV regurgitation  -per Cardiology       For questions or updates, please contact [facility] Please consult [email or web address] for contact info under Cardiology/STEMI.  Signed, Canary Brim, MSN, APRN, NP-C, AGACNP-BC [facility] - Electrophysiology  09/14/2023, 2:21 PM  EP Attending  Patient seen and examined. Agree with above. The  patient has been stable overnight. She is still pacing.Her INR is 1.3 as she was not given coumadin until vascular surgery decided that no operation required. Will hope to change out her PPM in the next day or two.   Sharlot Gowda [name] Sangiovanni,MD

## 2023-09-14 NOTE — Progress Notes (Addendum)
Progress Note    09/14/2023 9:20 AM * No surgery date entered *  Subjective:  Denies any chest pain or back pain    Vitals:   09/14/23 0613 09/14/23 0811  BP:  127/74  Pulse:  65  Resp: 17 19  Temp:  (!) 97.5 F (36.4 C)  SpO2:  96%   Physical Exam: Cardiac:  regular Lungs:  non labored Extremities:  2+ radial and DP pulses bilaterally Abdomen:  soft, non tender Neurologic: alert and oriented  CBC    Component Value Date/Time   WBC 7.3 09/14/2023 0127   RBC 4.32 09/14/2023 0127   HGB 12.3 09/14/2023 0127   HCT 38.9 09/14/2023 0127   PLT 183 09/14/2023 0127   MCV 90.0 09/14/2023 0127   MCH 28.5 09/14/2023 0127   MCHC 31.6 09/14/2023 0127   RDW 15.7 (H) 09/14/2023 0127   LYMPHSABS 0.7 08/11/2022 0144   MONOABS 0.5 08/11/2022 0144   EOSABS 0.0 08/11/2022 0144   BASOSABS 0.0 08/11/2022 0144    BMET    Component Value Date/Time   NA 131 (L) 09/14/2023 0127   NA 142 12/10/2021 1401   K 4.3 09/14/2023 0127   CL 97 (L) 09/14/2023 0127   CO2 25 09/14/2023 0127   GLUCOSE 87 09/14/2023 0127   BUN 16 09/14/2023 0127   BUN 13 12/10/2021 1401   CREATININE 1.00 09/14/2023 0127   CALCIUM 8.9 09/14/2023 0127   GFRNONAA 58 (L) 09/14/2023 0127   GFRAA  09/03/2008 0945    >60        The eGFR has been calculated using the MDRD equation. 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 INR 1.3 this morning. Resumed  Coumadin per Cardiology/ EP. Getting Generator change today in OR with EP   Graceann Congress, PA-C [facility] [phone] 09/14/2023 9:20 AM  VASCULAR STAFF ADDENDUM: I have independently interviewed and examined the patient. I agree with the above.  Patient seen this morning-no complaints Repeat imagine - dissection reviewed.  It appears that the defect is in zone 4 of the aorta.  No prior imaging, so unsure as to the timeline when this occurred.  She has been asymptomatic throughout this hospitalization.  Furthermore, there is a small amount of aneurysmal degeneration which is usually a sign that the lesion is chronic.  Regardless, I think that if we treat the lesion as acute, she would be best treated in the subacute setting, which would be in roughly 1 month.  If chronic, repeat imaging in 1 month should not demonstrate significant changes. I think regardless she is high risk for stroke due to the amount of atherosclerotic disease in her aortic arch.  My plan is to see her in 1 month's time with repeat CT chest abdomen pelvis for dissection. We discussed continued medical management versus operative repair at that time.  Victorino Sparrow MD [facility] Phone Number: [phone] 09/14/2023 9:56 AM

## 2023-09-14 NOTE — Care Management Important Message (Addendum)
Important Message  Patient Details  Name: TOVA KROGSTAD MRN: [id number] Date of Birth: 11/14/1945   Important Message Given:  NO     Evelise Reine-Martin 09/14/2023, 3:24 PM

## 2023-09-14 NOTE — TOC Benefit Eligibility Note (Signed)
Pharmacy Patient Advocate Encounter  Insurance verification completed.    The patient is insured through  [employer] Part D    Ran test claim for [employer]. Currently a quantity of 60 is a 30 day supply and the co-pay is $11.20 .  Ran test claim for [employer]. Currently a quantity of 30 is a 30 day supply and the co-pay is $11.20 .  Ran test claim for [employer]. Currently a quantity of 30 is a 30 day supply and the co-pay is $11.20 .  This test claim was processed through [facility]- copay amounts may vary at other pharmacies due to pharmacy/plan contracts, or as the patient moves through the different stages of their insurance plan.

## 2023-09-14 NOTE — Progress Notes (Addendum)
PHARMACY - ANTICOAGULATION CONSULT NOTE  Pharmacy Consult for Heparin Indication: atrial fibrillation, mechanical valve  Patient Measurements: Weight: 55.5 kg (122 lb 6.4 oz) Heparin Dosing Weight: total weight  Vital Signs: Temp: 97.5 F (36.4 C) (11/26 0811) Temp Source: Oral (11/26 0811) BP: 114/65 (11/26 0355) Pulse Rate: 65 (11/26 0811)  Labs: Recent Labs    09/12/23 0803 09/12/23 1421 09/13/23 0233 09/13/23 0814 09/14/23 0127  HGB 13.2  --  12.4  --  12.3  HCT 39.5  --  38.0  --  38.9  PLT 201  --  165  --  183  LABPROT 24.5*  --   --  17.6* 16.5*  INR 2.2*  --   --  1.4* 1.3*  HEPARINUNFRC  --   --   --   --  0.28*  CREATININE 1.21* 1.02* 0.92  --  1.00  TROPONINIHS 24*  --   --   --   --     CrCl cannot be calculated (Unknown ideal weight.).  Assessment: 76 yo female on chronic warfarin for pAF, mechanical MVR, now on hold for PPM generator change and possible BiV upgrade. INR on admit 6.4 s/p Vit K, now INR 1.3 today. Patient also with aortic dissection  - VVS following and no procedures are planned  Plans are to resume warfarin and restart heparin. For PPM generator change and possible upgrade when INR closer to 1.8  Home warfarin regimen: 7.5mg /day  Goal of Therapy:  Heparin level 0.3-0.7 units/ml Monitor platelets by anticoagulation protocol: Yes   Plan:  -heparin 3000 units x1 then restart heparin at 900 units/jr -Heparin level in 8 hrs -Warfarin 10mg  po x1 -Daily PT/INR  Harland German, PharmD Clinical Pharmacist **Pharmacist phone directory can now be found on [email or web address] ([email or web address]).  Listed under [facility].

## 2023-09-15 ENCOUNTER — Encounter (HOSPITAL_COMMUNITY): Payer: Self-pay | Admitting: Pulmonary Disease

## 2023-09-15 ENCOUNTER — Encounter (HOSPITAL_COMMUNITY)
Admission: AD | Disposition: A | Discharge status: Home Health | Payer: Self-pay | Source: Other Acute Inpatient Hospital | Attending: Internal Medicine

## 2023-09-15 ENCOUNTER — Encounter (HOSPITAL_COMMUNITY): Payer: Self-pay

## 2023-09-15 ENCOUNTER — Telehealth: Payer: Self-pay | Admitting: Cardiology

## 2023-09-15 DIAGNOSIS — T82111S Breakdown (mechanical) of cardiac pulse generator (battery), sequela: Secondary | ICD-10-CM | POA: Diagnosis not present

## 2023-09-15 DIAGNOSIS — T82118A Breakdown (mechanical) of other cardiac electronic device, initial encounter: Secondary | ICD-10-CM | POA: Insufficient documentation

## 2023-09-15 DIAGNOSIS — I420 Dilated cardiomyopathy: Secondary | ICD-10-CM | POA: Diagnosis not present

## 2023-09-15 LAB — CBC
HCT: 36.1 % (ref 36.0–46.0)
Hemoglobin: 11.5 g/dL — ABNORMAL LOW (ref 12.0–15.0)
MCH: 29.1 pg (ref 26.0–34.0)
MCHC: 31.9 g/dL (ref 30.0–36.0)
MCV: 91.4 fL (ref 80.0–100.0)
Platelets: 141 10*3/uL — ABNORMAL LOW (ref 150–400)
RBC: 3.95 MIL/uL (ref 3.87–5.11)
RDW: 15.8 % — ABNORMAL HIGH (ref 11.5–15.5)
WBC: 5.9 10*3/uL (ref 4.0–10.5)
nRBC: 0 % (ref 0.0–0.2)

## 2023-09-15 LAB — BASIC METABOLIC PANEL
Anion gap: 6 (ref 5–15)
BUN: 7 mg/dL — ABNORMAL LOW (ref 8–23)
CO2: 27 mmol/L (ref 22–32)
Calcium: 8.4 mg/dL — ABNORMAL LOW (ref 8.9–10.3)
Chloride: 103 mmol/L (ref 98–111)
Creatinine, Ser: 0.89 mg/dL (ref 0.44–1.00)
GFR, Estimated: >60 mL/min (ref >60)
Glucose, Bld: 100 mg/dL — ABNORMAL HIGH (ref 70–99)
Potassium: 3.4 mmol/L — ABNORMAL LOW (ref 3.5–5.1)
Sodium: 136 mmol/L (ref 135–145)

## 2023-09-15 LAB — PROTIME-INR
INR: 1.3 — ABNORMAL HIGH (ref 0.8–1.2)
INR: 1.2 (ref 0.8–1.2)
Prothrombin Time: 16.3 s — ABNORMAL HIGH (ref 11.4–15.2)
Prothrombin Time: 15.6 s — ABNORMAL HIGH (ref 11.4–15.2)

## 2023-09-15 LAB — HEPARIN LEVEL (UNFRACTIONATED)
Heparin Unfractionated: 0.44 [IU]/mL (ref 0.30–0.70)
Heparin Unfractionated: 0.36 [IU]/mL (ref 0.30–0.70)

## 2023-09-15 SURGERY — BIV UPGRADE

## 2023-09-15 MED ORDER — WARFARIN SODIUM 5 MG PO TABS
10.0000 mg | ORAL_TABLET | Freq: Once | ORAL | Status: AC
  Administered 2023-09-15: 10 mg via ORAL
  Filled 2023-09-15: qty 2

## 2023-09-15 MED ORDER — HEPARIN (PORCINE) 25000 UT/250ML-% IV SOLN
800.0000 [IU]/h | INTRAVENOUS | Status: AC
  Administered 2023-09-16: 800 [IU]/h via INTRAVENOUS
  Filled 2023-09-15 (×2): qty 250

## 2023-09-15 NOTE — Progress Notes (Signed)
Rounding Note    Patient Name: LUJANE COLQUITT Date of Encounter: 09/15/2023  [facility] Cardiologist: Norman Herrlich, MD   Subjective   Pt denies CP; mild dyspnea  Inpatient Medications    Scheduled Meds:  ALPRAZolam  2 mg Oral BID   atorvastatin  40 mg Oral Once per day on Monday Thursday   chlorhexidine  60 mL Topical Once   Chlorhexidine Gluconate Cloth  6 each Topical Daily   furosemide  40 mg Oral Daily   levothyroxine  75 mcg Oral Q0600   pantoprazole  40 mg Oral Daily   sacubitril-valsartan  1 tablet Oral BID   sodium chloride flush  3 mL Intravenous Q12H   spironolactone  12.5 mg Oral Daily   warfarin  10 mg Oral ONCE-1600   Warfarin - Pharmacist Dosing Inpatient   Does not apply q1600   Continuous Infusions:  sodium chloride     sodium chloride 50 mL/hr at 09/14/23 0812   PRN Meds: acetaminophen, albuterol, cyclobenzaprine, docusate sodium, mouth rinse, oxyCODONE, polyethylene glycol, sodium chloride flush   Vital Signs    Vitals:   09/15/23 0024 09/15/23 0415 09/15/23 0418 09/15/23 0802  BP: 118/66 114/68  118/60  Pulse: 64 65  67  Resp: 20 17 17 20   Temp: 98.1 F (36.7 C) 98.3 F (36.8 C)  97.8 F (36.6 C)  TempSrc: Oral Oral  Oral  SpO2: 96% 97%  94%  Weight:   55.5 kg    No intake or output data in the 24 hours ending 09/15/23 0810     09/15/2023    4:18 AM 09/14/2023    6:13 AM 09/13/2023    5:00 AM  Last 3 Weights  Weight (lbs) 122 lb 6.4 oz 122 lb 6.4 oz 126 lb 1.7 oz  Weight (kg) 55.52 kg 55.52 kg 57.2 kg      Telemetry    Sinus with ventricular pacing- Personally Reviewed   Physical Exam   GEN: NAD, WD Neck: supple, no JVD Cardiac: RRR, no rub Respiratory: CTA, no wheeze GI: Soft, NT/ND, no masses MS: No edema. Neuro:  No focal findings Psych: Normal affect   Labs    High Sensitivity Troponin:   Recent Labs  Lab 09/12/23 0803  TROPONINIHS 24*     Chemistry Recent Labs  Lab 09/12/23 0803  09/12/23 1421 09/13/23 0233 09/14/23 0127 09/15/23 0500  NA 133*   < > 130* 131* 136  K 2.7*   < > 3.5 4.3 3.4*  CL 96*   < > 99 97* 103  CO2 25   < > 25 25 27   GLUCOSE 109*   < > 101* 87 100*  BUN 11   < > 14 16 7*  CREATININE 1.21*   < > 0.92 1.00 0.89  CALCIUM 8.9   < > 8.2* 8.9 8.4*  MG 2.3  --  2.5* 2.2  --   PROT 6.9  --   --   --   --   ALBUMIN 3.9  --   --   --   --   AST 25  --   --   --   --   ALT 14  --   --   --   --   ALKPHOS 71  --   --   --   --   BILITOT 1.5*  --   --   --   --   GFRNONAA 46*   < > >60  58* >60  ANIONGAP 12   < > 6 9 6    < > = values in this interval not displayed.     Hematology Recent Labs  Lab 09/13/23 0233 09/14/23 0127 09/15/23 0500  WBC 5.1 7.3 5.9  RBC 4.26 4.32 3.95  HGB 12.4 12.3 11.5*  HCT 38.0 38.9 36.1  MCV 89.2 90.0 91.4  MCH 29.1 28.5 29.1  MCHC 32.6 31.6 31.9  RDW 15.7* 15.7* 15.8*  PLT 165 183 141*    BNP Recent Labs  Lab 09/12/23 1200  BNP 419.8*      Radiology    CT Angio Chest/Abd/Pel for Dissection W and/or W/WO  Result Date: 09/13/2023 CLINICAL DATA:  Dissection follow-up EXAM: CT ANGIOGRAPHY CHEST, ABDOMEN AND PELVIS TECHNIQUE: Noncontrast chest CT was performed. Multidetector CT imaging through the chest, abdomen and pelvis was performed using the standard protocol during bolus administration of intravenous contrast. Multiplanar reconstructed images and MIPs were obtained and reviewed to evaluate the vascular anatomy. RADIATION DOSE REDUCTION: This exam was performed according to the departmental dose-optimization program which includes automated exposure control, adjustment of the mA and/or kV according to patient size and/or use of iterative reconstruction technique. CONTRAST:  75mL OMNIPAQUE IOHEXOL 350 MG/ML SOLN COMPARISON:  CTA chest abdomen and pelvis 10/02/2021. CT angiogram chest 09/11/2023. FINDINGS: CTA CHEST FINDINGS Cardiovascular: Again seen is aortic dissection which begins just distal to the  takeoff of the left subclavian artery extending to the level of the mid/proximal descending thoracicaorta, unchanged from prior. There is a small amount of contrast filling the false lumen. The descending thoracic aorta is dilated measuring up to 4.3 x 3.8 cm, unchanged from prior examination. The distal descending aorta is nondilated without dissection measuring up to 2.7 cm. The ascending aorta is dilated measuring 4.2 x 4.0 cm, unchanged. There are atherosclerotic calcifications of the aorta. Origin of the great vessels appear patent. The heart is enlarged. Pacemaker is present. There is no pericardial effusion. The main pulmonary artery is enlarged compatible with pulmonary artery hypertension. There is no central pulmonary embolism. Mediastinum/Nodes: No enlarged mediastinal, hilar, or axillary lymph nodes. Thyroid gland, trachea, and esophagus demonstrate no significant findings. There is no mediastinal fluid collection or pneumomediastinum. Lungs/Pleura: There are minimal patchy ground-glass opacities throughout the lungs bilaterally similar to prior. There is no pleural effusion or pneumothorax. There is bibasilar atelectasis. Musculoskeletal: Sternotomy wires are present. Mild compression deformity of T10 is unchanged. Review of the MIP images confirms the above findings. CTA ABDOMEN AND PELVIS FINDINGS VASCULAR Aorta: Normal caliber aorta without aneurysm, dissection, vasculitis or significant stenosis. There is atherosclerotic calcifications throughout the aorta. Celiac: Patent without evidence of aneurysm, dissection, vasculitis or significant stenosis. SMA: Patent without evidence of aneurysm, dissection, vasculitis or significant stenosis. Renals: Both renal arteries are patent without evidence of aneurysm, dissection, vasculitis, fibromuscular dysplasia or significant stenosis. IMA: Patent without evidence of aneurysm, dissection, vasculitis or significant stenosis. Inflow: Patent without evidence of  aneurysm, dissection, vasculitis or significant stenosis. Veins: No obvious venous abnormality within the limitations of this arterial phase study. Review of the MIP images confirms the above findings. NON-VASCULAR Hepatobiliary: No focal liver abnormality is seen. Status post cholecystectomy. No biliary dilatation. Pancreas: Unremarkable. No pancreatic ductal dilatation or surrounding inflammatory changes. Spleen: Normal in size without focal abnormality. Splenule is again seen. Adrenals/Urinary Tract: Adrenal glands are unremarkable. Kidneys are normal, without renal calculi, focal lesion, or hydronephrosis. Bladder is unremarkable. Stomach/Bowel: Stomach is within normal limits. No evidence of bowel wall thickening,  distention, or inflammatory changes. The appendix is not seen. Lymphatic: No enlarged lymph nodes are identified. Reproductive: Uterus and adnexa are within normal limits. Other: No abdominal wall hernia or abnormality. No abdominopelvic ascites. Musculoskeletal: Mild compression deformity of L1 again noted. The bones are diffusely osteopenic. Right hip arthroplasty present. Review of the MIP images confirms the above findings. IMPRESSION: 1. Stable aortic dissection extending from the proximal ascending aorta to the mid/proximal descending thoracic aorta. No change in aortic aneurysm measuring up to 4.3 cm. 2. Stable cardiomegaly. 3. Stable enlargement of the main pulmonary artery compatible with pulmonary artery hypertension. 4. Stable minimal patchy ground-glass opacities throughout the lungs bilaterally. Findings may be related to edema or infection. 5. No acute localizing process in the abdomen or pelvis. 6. Aortic atherosclerosis. Aortic Atherosclerosis (ICD10-I70.0). Electronically Signed   By: Darliss Cheney M.D.   On: 09/13/2023 20:37   ECHOCARDIOGRAM COMPLETE  Result Date: 09/13/2023    ECHOCARDIOGRAM REPORT   Patient Name:   TAJI OBROCHTA Date of Exam: 09/13/2023 Medical Rec #:   [id number]          Height:       58.0 in Accession #:    [id number]         Weight:       126.1 lb Date of Birth:  [birth year]          BSA:          1.497 m Patient Age:    77 years           BP:           199/82 mmHg Patient Gender: F                  HR:           65 bpm. Exam Location:  Inpatient Procedure: 2D Echo, Cardiac Doppler, Color Doppler and Intracardiac            Opacification Agent Indications:    Dyspnea R06.00  History:        Patient has prior history of Echocardiogram examinations, most                 recent 11/23/2022. CHF, Previous Myocardial Infarction, Pacemaker,                 Aortic Valve Disease and Mitral Valve Disease; Risk                 Factors:Hypertension.  Sonographer:    Harriette Bouillon RDCS Referring Phys: [phone] HAO MENG IMPRESSIONS  1. Left ventricular ejection fraction, by estimation, is 30 to 35%. The left ventricle has moderately decreased function. The left ventricle demonstrates global hypokinesis. There is mild concentric left ventricular hypertrophy. Left ventricular diastolic parameters are indeterminate.  2. Right ventricular systolic function is mildly reduced. The right ventricular size is normal. There is mildly elevated pulmonary artery systolic pressure.  3. Left atrial size was severely dilated.  4. Right atrial size was moderately dilated.  5. MV peak gradient with MVA by P1/2t 4.2cm2. The mitral valve has been repaired/replaced. No evidence of mitral valve regurgitation. No evidence of mitral stenosis.  6. Tricuspid valve regurgitation is moderate.  7. The aortic valve has been repaired/replaced. Aortic valve regurgitation is not visualized. No aortic stenosis is present. Echo findings are consistent with normal structure and function of the aortic valve prosthesis. Aortic valve area, by VTI measures 2.88 cm. Aortic valve mean gradient measures 4.0 mmHg.  Aortic valve Vmax measures 1.42 m/s.  8. The inferior vena cava is normal in size with greater than 50%  respiratory variability, suggesting right atrial pressure of 3 mmHg. FINDINGS  Left Ventricle: Left ventricular ejection fraction, by estimation, is 30 to 35%. The left ventricle has moderately decreased function. The left ventricle demonstrates global hypokinesis. The left ventricular internal cavity size was normal in size. There is mild concentric left ventricular hypertrophy. Abnormal (paradoxical) septal motion, consistent with RV pacemaker. Left ventricular diastolic parameters are indeterminate. Right Ventricle: The right ventricular size is normal. No increase in right ventricular wall thickness. Right ventricular systolic function is mildly reduced. There is mildly elevated pulmonary artery systolic pressure. The tricuspid regurgitant velocity  is 2.91 m/s, and with an assumed right atrial pressure of 3 mmHg, the estimated right ventricular systolic pressure is 36.9 mmHg. Left Atrium: Left atrial size was severely dilated. Right Atrium: Right atrial size was moderately dilated. Pericardium: There is no evidence of pericardial effusion. Mitral Valve: MV peak gradient with MVA by P1/2t 4.2cm2. The mitral valve has been repaired/replaced. No evidence of mitral valve regurgitation. There is a mechanical valve present in the mitral position. No evidence of mitral valve stenosis. Tricuspid Valve: The tricuspid valve is normal in structure. Tricuspid valve regurgitation is moderate . No evidence of tricuspid stenosis. Aortic Valve: The aortic valve has been repaired/replaced. Aortic valve regurgitation is not visualized. No aortic stenosis is present. Aortic valve mean gradient measures 4.0 mmHg. Aortic valve peak gradient measures 8.0 mmHg. Aortic valve area, by VTI measures 2.88 cm. There is a 23 mm Ultra, stented (TAVR) valve present in the aortic position. Echo findings are consistent with normal structure and function of the aortic valve prosthesis. Pulmonic Valve: The pulmonic valve was normal in  structure. Pulmonic valve regurgitation is trivial. No evidence of pulmonic stenosis. Aorta: The aortic root is normal in size and structure. Venous: The inferior vena cava is normal in size with greater than 50% respiratory variability, suggesting right atrial pressure of 3 mmHg. IAS/Shunts: No atrial level shunt detected by color flow Doppler. Additional Comments: A device lead is visualized.  LEFT VENTRICLE PLAX 2D LVIDd:         4.70 cm   Diastology LVIDs:         3.80 cm   LV e' medial:    4.45 cm/s LV PW:         1.20 cm   LV E/e' medial:  28.3 LV IVS:        1.20 cm   LV e' lateral:   6.75 cm/s LVOT diam:     2.00 cm   LV E/e' lateral: 18.7 LV SV:         70 LV SV Index:   47 LVOT Area:     3.14 cm  RIGHT VENTRICLE         IVC TAPSE (M-mode): 1.1 cm  IVC diam: 1.50 cm LEFT ATRIUM            Index        RIGHT ATRIUM           Index LA diam:      4.60 cm  3.07 cm/m   RA Area:     22.60 cm LA Vol (A4C): 106.0 ml 70.81 ml/m  RA Volume:   74.70 ml  49.90 ml/m  AORTIC VALVE AV Area (Vmax):    2.73 cm AV Area (Vmean):   3.06 cm AV Area (VTI):  2.88 cm AV Vmax:           141.50 cm/s AV Vmean:          92.800 cm/s AV VTI:            0.242 m AV Peak Grad:      8.0 mmHg AV Mean Grad:      4.0 mmHg LVOT Vmax:         123.00 cm/s LVOT Vmean:        90.300 cm/s LVOT VTI:          0.222 m LVOT/AV VTI ratio: 0.92  AORTA Ao Root diam: 3.50 cm MITRAL VALVE                TRICUSPID VALVE MV Area (PHT): 4.24 cm     TR Peak grad:   33.9 mmHg MV Decel Time: 179 msec     TR Vmax:        291.00 cm/s MV E velocity: 126.00 cm/s                             SHUNTS                             Systemic VTI:  0.22 m                             Systemic Diam: 2.00 cm Arvilla Meres MD Electronically signed by Arvilla Meres MD Signature Date/Time: 09/13/2023/10:20:14 AM    Final       Patient Profile     77 y.o. female with past medical history of mechanical mitral valve, history of TAVR, status post pacemaker,  paroxysmal atrial fibrillation, coronary artery disease, hypertension, hyperlipidemia, COPD, thoracic aortic aneurysm admitted with aortic dissection for evaluation of hypertension and dyspnea.  CTA this admission shows aortic dissection extending from the proximal ascending aorta to the mid to proximal descending thoracic aorta, aortic aneurysm measuring 4.3 cm.  Follow-up echocardiogram this admission shows ejection fraction 30 to 35%, mild left ventricular hypertrophy, mild RV dysfunction, biatrial enlargement, status post mitral valve replacement with no mitral regurgitation and mean gradient 6 mmHg, moderate tricuspid regurgitation, status post aortic valve replacement with mean gradient 4 mmHg.  CT of the chest at [facility] by report showed dissection from the isthmus to the descending thoracic aorta and dilated ascending aorta at 4.2 cm.  Interrogation of pacemaker revealed ERI.  Assessment & Plan    1 aortic dissection-Vascular surgery has reviewed and plans FU CTA one month as outpt.   2 coronary artery disease-last catheterization in December 2022 showed patent LAD stent.  Continue medical therapy.  Continue statin.  3 history of paroxysmal atrial fibrillation-patient is on chronic Coumadin for her mitral valve replacement.    4 status post mechanical mitral valve and history of TAVR-continue SBE prophylaxis.  Coumadin has been resumed; continue heparin until pacemaker; (note EP would like INR at 1.8 at time of generator change to avoid IV heparin/risk of hematoma following procedure).   5 history of [employer] pacemaker-device is ERI.  LV function is reduced compared to previous.  Plan is ultimately for upgrade to biventricular pacemaker.  6 hypertension-given aortic dissection will need strict control.  7 hyperlipidemia-continue statin.  8 history of dilated aortic root-4.3 cm on recent CTA.  Will need follow-up imaging in the future.  9 chronic diastolic congestive heart failure-she  appears to be euvolemic.  Continue Lasix at present dose.  10 cardiomyopathy-LV function is reduced compared to previous. Not clear that she is willing to take entresto.  Continue spironolactone 12.5 mg daily.  Add coreg later if she is willing. Follow-up echocardiogram in 3 months to see if LV function has improved after generator change/biventricular upgrade.  For questions or updates, please contact [facility] Please consult [email or web address] for contact info under        Signed, Olga Millers, MD  09/15/2023, 8:10 AM

## 2023-09-15 NOTE — Progress Notes (Signed)
   09/15/23 1446  TOC Brief Assessment  Insurance and Status Reviewed  Patient has primary care physician Yes  Home environment has been reviewed home  Prior level of function: self  Prior/Current Home Services No current home services  Social Determinants of Health Reivew SDOH reviewed no interventions necessary  Readmission risk has been reviewed Yes  Transition of care needs no transition of care needs at this time     Note EP following for goal INR 1.8 to move forward for generator change / possible BiV upgrade - ?procedure on Friday- We will continue to monitor patient advancement through interdisciplinary progression rounds. If new patient transition needs arise, please place a TOC consult.

## 2023-09-15 NOTE — Telephone Encounter (Signed)
[name] is trying to speak with a nurse about the hospital trying to push the pt into trying to take [facility]. Please advise

## 2023-09-15 NOTE — Progress Notes (Signed)
Heart Failure Stewardship Pharmacist Progress Note   PCP: Marylen Ponto, MD PCP-Cardiologist: Norman Herrlich, MD    HPI:  77 yo F with PMH of HFimpEF, aortic aneurysm, PPM, CKD III, hypothyroidism, long QT syndrome, CAD, afib, rheumatic heart disease, and aortic stenosis s/p TAVR.  Presented to the [facility] ED on 11/23 with shortness of breath, weakness, and general malaise. BNP 2660, UDS positive for THC, methadone, and BZD. INR supra-therapeutic 6.4 s/p vitamin K. CTA with type B aortic dissection. She was started on diltiazem and transferred to [facility] for possible vascular intervention. CXR with no acute findings. ECHO 11/25 with LVEF reduced to 30-35%, global hypokinesis, mild concentric LVH, RV mildly reduced, mildly elevated PA pressure. Diltiazem discontinued. Vascular surgery evaluated her and plans for follow up CTA in 1 month as outpatient. EP following for generator change / biventricular upgrade.   Reports prior intolerance to atenolol and propranolol in the past. Also reports intolerance to lisinopril - had a cluster headache for a month. States that she is very sensitive to medications and will often take a half a tablet of medication initially to see what her body will do. States she is a very busy person and walks a lot.   Denies shortness of breath or LE edema. Refusing Entresto now. She believes she was told by Dr. Dulce Sellar that she cannot take this since she has afib and thyroid issues. Upon further chart review, it looks like she was briefly tried on Entresto in the past and stopped due to dizziness. Patient is adamant though that she has never taken this and wants to speak with Dr. Dulce Sellar directly before changing her medications. Explained that the cardiology team that has been seeing her in the hospital is within the same group but she refusing to discuss further.   Current HF Medications: Diuretic: furosemide 40 mg PO daily ACE/ARB/ARNI: Entresto 24/26 mg BID -  declining doses MRA: spironolactone 12.5 mg daily  Prior to admission HF Medications: Diuretic: furosemide 20 mg BID  Pertinent Lab Values: Serum creatinine 0.89, BUN 7, Potassium 3.4, Sodium 141, BNP 419.8, Magnesium 2.2, A1c 5.6   Vital Signs: Weight: 122 lbs (admission weight: 126 lbs) Blood pressure: 120-150/60s  Heart rate: 60s  I/O: incomplete  Medication Assistance / Insurance Benefits Check: Does the patient have prescription insurance?  Yes Type of insurance plan: [facility] Medicare  Outpatient Pharmacy:  Prior to admission outpatient pharmacy: [facility] Is the patient willing to use [facility] pharmacy at discharge? Yes Is the patient willing to transition their outpatient pharmacy to utilize a [facility] outpatient pharmacy?   No    Assessment: 1. Chronic systolic CHF (LVEF 30-35%). NYHA class II symptoms. - Continue furosemide 40 mg daily. Strict I/Os and daily weights. Keep K>4 and Mg>2. - No BB with history of severe bradycardia (pt unsure if this happened before or after the PPM was placed).  - Continue spironolactone 12.5 mg daily - She is refusing Entresto. Tried to explain the mechanism and how we often use Entresto for CHF in patients with afib but she became very argumentative and refused to discuss further.  - Consider adding SGLT2i prior to discharge   Plan: 1) Medication changes recommended at this time: - No changes  2) Patient assistance: - Entresto copay $11.20 - Farxiga/Jardiance copay $11.20  3)  Education  - Initial education complete - Instructed patient to take full prescribed dose of medications at discharge - Unclear if patient will take recommended medications  at discharge if they differ from her home medication list - Full education to be completed prior to discharge  Sharen Hones, PharmD, BCPS Heart Failure Stewardship Pharmacist Phone [phone]
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
## 2023-09-15 NOTE — Progress Notes (Signed)
Heart Failure Nurse Navigator Progress Note  PCP: Marylen Ponto, MD PCP-Cardiologist: Dulce Sellar Admission Diagnosis: None Admitted from: [facility] to [facility]  Presentation:   [name] [name] presented with shortness of breath, weakness,over last couple of weeks,  UDS + for THC, methadone, and benzodiazepine, BNP 2660, CT of chest shows dissection from isthmus to descending thoracic aorta was aneurysm in ascending aorta measuring at 4.2 cm. Due to this finding patient was transferred from [place] to [place]. Plan is too upgrade to biventricular pacemaker.   Patient was educated on the sign and symptoms of heart failure, daily weights, when to call her doctor or go to the ED. Diet/ fluid restrictions, reports to using salt for everything from her veggies to her fruit, and she has a ice chip machine at home that she uses to eat ice chips thru out the day) explained to patient this all goes into account for her fluid restrictions. Education on taking all medications as prescribed and attending all medical appointments. Patient verbalized her understanding of education. A HF TOC appointment was scheduled for 10/01/2023 @ 3 pm.       ECHO/ LVEF: 30-35% HFrEF  Clinical Course:  Past Medical History:  Diagnosis Date   Anxiety    Atrial arrhythmia    With prior history of atrial flutter ablation   Bradycardia    s/p pacemaker implantation   Chronic anticoagulation 08/26/2021   Chronic combined systolic and diastolic CHF (congestive heart failure) (HCC) 09/30/2021   CKD (chronic kidney disease) stage 3, GFR 30-59 ml/min (HCC) 08/26/2021   Closed right hip fracture (HCC) 08/11/2022   CONGESTIVE HEART FAILURE 07/31/2010   Qualifier: Diagnosis of  By: Graciela Husbands, MD, Susie Cassette    Coronary atherosclerosis 06/08/2014   Last Assessment & Plan: Formatting of this note might be different from the original. Complains of consistent and persistent shortness of breath.  She has been  experiencing some bilateral arm pain.  She is certain she did not experience that discomfort but that was not her anginal equivalent when she had a myocardial infarction several years back.  With that said, symptoms do improve with the subl   DNR (do not resuscitate) 09/30/2021   Dysphagia 03/10/2023   Essential hypertension 07/31/2010   Qualifier: Diagnosis of   By: Graciela Husbands, MD, Susie Cassette      Fall at home, initial encounter 08/11/2022   GERD (gastroesophageal reflux disease)    H/O mitral valve replacement with mechanical valve 10/14/2021   Hypothyroidism    Influenza A 08/26/2021   Left ventricular dysfunction 06/08/2014   Long Q-T syndrome    Malnutrition of moderate degree 08/28/2021   Mixed hyperlipidemia 12/12/2019   Myocardial infarct (HCC) 11/20/2011   Stent placement for 100% blockage of LAD   Pacemaker reprogramming/check 10/19/2008   Last Assessment & Plan: Formatting of this note might be different from the original. Normal Device Function ----- NEARING Elective Replacement ---- Presents in sinus rhythm. No Permanent Device Reprogramming Required -  Follow up 3-4 months, remote monitoring on a quarterly basis, as per protocol.  Encouraged follow up with PCP and Cardiologist.   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 Excell Seltzer and Dr. Laneta Simmers   Senile nuclear sclerosis 10/17/2015   Severe aortic stenosis 08/26/2021  SHORTNESS OF BREATH 07/31/2010   Qualifier: Diagnosis of   By: Graciela Husbands, MD, Susie Cassette      Supratherapeutic INR 08/11/2022   Thoracic aortic aneurysm 05/13/2021   Transaminitis    TRICUSPID REGURGITATION 12/23/2010   Qualifier: Diagnosis of  By: Graciela Husbands, MD, [facility], Ty Hilts      Social  History   Socioeconomic History   Marital status: Single    Spouse name: Not on file   Number of children: Not on file   Years of education: Not on file   Highest education level: Not on file  Occupational History   Not on file  Tobacco Use   Smoking status: Never   Smokeless tobacco: Never  Substance and Sexual Activity   Alcohol use: Never   Drug use: Never   Sexual activity: Not on file  Other Topics Concern   Not on file  Social History Narrative   Not on file   Social Determinants of Health   Financial Resource Strain: Low Risk  (03/08/2021)   Received from [facility]   Overall Financial Resource Strain (CARDIA)    Difficulty of Paying Living Expenses: Not hard at all  Food Insecurity: Food Insecurity Present (09/12/2023)   Hunger Vital Sign    Worried About Running Out of Food in the Last Year: Sometimes true    Ran Out of Food in the Last Year: Never true  Transportation Needs: No Transportation Needs (09/12/2023)   PRAPARE - Administrator, Civil Service (Medical): No    Lack of Transportation (Non-Medical): No  Physical Activity: Not on file  Stress: Not on file  Social Connections: Not on file   Education Assessment and Provision:  Detailed education and instructions provided on heart failure disease management including the following:  Signs and symptoms of Heart Failure When to call the physician Importance of daily weights Low sodium diet Fluid restriction Medication management Anticipated future follow-up appointments  Patient education given on each of the above topics.  Patient acknowledges understanding via teach back method and acceptance of all instructions.  Education Materials:  "Living Better With Heart Failure" Booklet, HF zone tool, & Daily Weight Tracker Tool.  Patient has scale at home: Yes Patient has pill box at home: Yes    High Risk Criteria for Readmission and/or Poor Patient Outcomes: Heart  failure hospital admissions (last 6 months): 0  No Show rate: 20% Difficult social situation: No Demonstrates medication adherence: yes  Primary Language: English  Literacy level: Reading, writing, and comprehension  Barriers of Care:   New HF education Diet/ fluid restrictions ( salt/ ice chips)  Daily weights  Considerations/Referrals:   Referral made to Heart Failure Pharmacist Stewardship: Yes Referral made to Heart Failure CSW/NCM TOC: No Referral made to Heart & Vascular TOC clinic: Yes, 10/01/2023 @ 3 pm  Items for Follow-up on DC/TOC: Continued HF education Diet/ fluid restrictions ( salt/ ice chips)  Daily weights   Rhae Hammock, BSN, RN Heart Failure Teacher, adult education Only

## 2023-09-15 NOTE — Progress Notes (Signed)
PHARMACY - ANTICOAGULATION CONSULT NOTE  Pharmacy Consult for Heparin Indication: atrial fibrillation, mechanical valve  Patient Measurements: Weight: 55.5 kg (122 lb 6.4 oz) Heparin Dosing Weight: total weight  Vital Signs:    Labs: Recent Labs    09/13/23 0233 09/13/23 0814 09/14/23 0127 09/14/23 1711 09/15/23 0500 09/15/23 1127 09/15/23 2000  HGB 12.4  --  12.3  --  11.5*  --   --   HCT 38.0  --  38.9  --  36.1  --   --   PLT 165  --  183  --  141*  --   --   LABPROT  --    < > 16.5*  --  16.3* 15.6*  --   INR  --    < > 1.3*  --  1.3* 1.2  --   HEPARINUNFRC  --    < > 0.28* 0.81* 0.44  --  0.36  CREATININE 0.92  --  1.00  --  0.89  --   --    < > = values in this interval not displayed.    CrCl cannot be calculated (Unknown ideal weight.).  Assessment: 77 yo female on chronic warfarin for pAF, mechanical MVR, now on hold for PPM generator change and possible BiV upgrade. INR on admit 6.4 s/p Vit K, now INR 1.3 today. Patient also with aortic dissection  - VVS following.  Home warfarin regimen: 7.5mg /day  PM: heparin level 0.36 on heparin 800 units/hr. No issues with the infusion or bleeding reported.  Goal of Therapy:  Heparin level 0.3-0.7 units/ml Monitor platelets by anticoagulation protocol: Yes   Plan:  -Continue heparin drip at 800 units/hr, check confirmatory heparin level with AM labs -Warfarin 10mg  po x1 given today as previously ordered -Daily PT/INR/CBC/Heparin level -heparin set to end 09/16/23 23:59  Loralee Pacas, PharmD, BCPS 09/15/2023 8:42 PM  Please check AMION for all [facility] phone numbers After 10:00 PM, call Main Pharmacy [phone]

## 2023-09-15 NOTE — Progress Notes (Addendum)
PHARMACY - ANTICOAGULATION CONSULT NOTE  Pharmacy Consult for Heparin Indication: atrial fibrillation, mechanical valve  Patient Measurements: Weight: 55.5 kg (122 lb 6.4 oz) Heparin Dosing Weight: total weight  Vital Signs: Temp: 97.8 F (36.6 C) (11/27 0802) Temp Source: Oral (11/27 0802) BP: 118/60 (11/27 0802) Pulse Rate: 67 (11/27 0802)  Labs: Recent Labs    09/13/23 0233 09/13/23 0814 09/14/23 0127 09/14/23 1711 09/15/23 0500  HGB 12.4  --  12.3  --  11.5*  HCT 38.0  --  38.9  --  36.1  PLT 165  --  183  --  141*  LABPROT  --  17.6* 16.5*  --  16.3*  INR  --  1.4* 1.3*  --  1.3*  HEPARINUNFRC  --   --  0.28* 0.81* 0.44  CREATININE 0.92  --  1.00  --  0.89    CrCl cannot be calculated (Unknown ideal weight.).  Assessment: 77 yo female on chronic warfarin for pAF, mechanical MVR, now on hold for PPM generator change and possible BiV upgrade. INR on admit 6.4 s/p Vit K, now INR 1.3 today. Patient also with aortic dissection  - VVS following and no procedures are planned  Plans are to resume warfarin. For PPM generator change and possible upgrade 11/27 -heparin on hold for procedure  Home warfarin regimen: 7.5mg /day  Goal of Therapy:  Heparin level 0.3-0.7 units/ml Monitor platelets by anticoagulation protocol: Yes   Plan:  -Warfarin 10mg  po x1 -Daily PT/INR -Will follow plans post procedure  [name] [name], PharmD Clinical Pharmacist **Pharmacist phone directory can now be found on [email or web address] ([email or web address]).  Listed under [name].   Addendum: updated as of 09/15/23 14:00 This morning INR below goal of 1.6 to operate on patient (1.3 >1.2). Resumed UFH this afternoon and set to end 09/16/23 23:59. Follow-up 8 hour heparin level. Monitor daily CBC, INR, heparin level and s/sx of bruising bleeding  [name] [name], PharmD PGY2 Cardiology Pharmacy Resident

## 2023-09-15 NOTE — Progress Notes (Signed)
[facility] PROGRESS NOTE  [name] [name] ZOX:[id number] DOB: [birth year] DOA: 09/12/2023 PCP: Marylen Ponto, MD  Status is: Inpatient   Barriers to discharge: Social:  Clinical:  Level of care: Progressive   Code Status: Full Family Communication: Patient DVT prophylaxis:  COVID vaccination status:   Subjective:  Denies any shortness of breath.  Patient has been refusing some medications   Objective: Vitals:   09/15/23 0418 09/15/23 0802  BP:  118/60  Pulse:  67  Resp: 17 20  Temp:  97.8 F (36.6 C)  SpO2:  94%   No intake or output data in the 24 hours ending 09/15/23 1246  Filed Weights   09/13/23 0500 09/14/23 0613 09/15/23 0418  Weight: 57.2 kg 55.5 kg 55.5 kg    Exam:  Constitutional: NAD, calm, comfortable Eyes: PERRL, lids and conjunctivae normal ENMT: Mucous membranes are moist. Posterior pharynx clear of any exudate or lesions.Normal dentition.  Neck: normal, supple, no masses, no thyromegaly Respiratory: clear to auscultation bilaterally, no wheezing, no crackles. Normal respiratory effort. No accessory muscle use.  Cardiovascular: Regular rate and rhythm, no murmurs / rubs / gallops. No extremity edema. 2+ pedal pulses. No carotid bruits.  Abdomen: no tenderness, no masses palpated. No hepatosplenomegaly. Bowel sounds positive.  Musculoskeletal: no clubbing / cyanosis. No joint deformity upper and lower extremities. Good ROM, no contractures. Normal muscle tone.  Skin: no rashes, lesions, ulcers. No induration Neurologic: CN 2-12 grossly intact. Sensation intact, DTR normal. Strength 5/5 x all 4 extremities.  Psychiatric: Normal judgment and insight. Alert and oriented x 3. Normal mood.    Assessment/Plan:  77 year old female with history of mechanical mitral bowel about 20 years ago, HFrEF, UT aneurysm, CKD stage 3, hypothyroidism presents to the emergency department with complaints of  back pain,shortness of breath.  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  Patient was started on a esmolol, diltiazem drip.  CT surgery is consulted.    Aortic dissection - consulted vascular surgery -continued with Cardizem drip - close monitoring.   -plan for repeat CT angiogram -stable aortic dissection extending from the proximal ascending aorta to the mid proximal descending thoracic aorta -Recommended patient to follow-up as an outpatient in a month  Supratherapeutic INR -holding Coumadin - admitted with INR of 6.1. 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 -Get hemoglobin A1c  Hypokalemia  - replaced by mouth.    Hypothyroidism - check TSH - continue with Synthroid.    Paroxysmal atrial fibrillation -s/p ablation  Valvular heart disease -history of TAVR, MVR mechanical valve due to rheumatic heart disease  Polysubstance abuse -urine drug screen was positive for marijuana, methadone, opiates -Patient states he took some marijuana gummy small size.       Data Reviewed: Basic Metabolic Panel: Recent Labs  Lab 09/12/23 0803 09/12/23 1421 09/13/23 0233 09/14/23 0127 09/15/23 0500  NA 133* 131* 130* 131* 136  K 2.7* 4.8 3.5 4.3 3.4*  CL 96* 97* 99 97* 103  CO2 25 25 25 25 27   GLUCOSE 109* 118* 101* 87 100*  BUN 11 12 14 16  7*  CREATININE 1.21* 1.02* 0.92 1.00 0.89  CALCIUM 8.9 8.8* 8.2* 8.9 8.4*  MG 2.3  --  2.5* 2.2  --   PHOS 2.1*  --  3.0  --   --    Liver Function Tests: Recent Labs  Lab 09/12/23 0803  AST 25  ALT 14  ALKPHOS 71  BILITOT 1.5*  PROT 6.9  ALBUMIN 3.9    No results for input(s): "LIPASE", "AMYLASE" in the last 168 hours. No results for input(s): "AMMONIA" in the last 168 hours. CBC: Recent Labs  Lab 09/12/23 0803 09/13/23 0233 09/14/23 0127 09/15/23 0500  WBC 6.7 5.1 7.3 5.9  HGB 13.2 12.4 12.3 11.5*  HCT 39.5 38.0 38.9 36.1  MCV 85.1 89.2 90.0 91.4  PLT 201 165 183 141*   Cardiac Enzymes: No results for input(s): "CKTOTAL", "CKMB", "CKMBINDEX", "TROPONINI" in the last 168 hours. BNP (last 3 results) Recent Labs    09/12/23 1200  BNP 419.8*    ProBNP (last 3 results) No results for input(s): "PROBNP" in the last 8760 hours.  CBG: Recent Labs  Lab 09/12/23 1623 09/12/23 1949 09/12/23 2342 09/13/23 0405 09/13/23 0823  GLUCAP 108* 110* 124* 92 158*    Recent Results (from the past 240 hour(s))  Surgical PCR screen     Status: None   Collection Time: 09/14/23  6:28 AM   Specimen: Nasal Mucosa; Nasal Swab  Result Value Ref Range Status   MRSA, PCR NEGATIVE NEGATIVE Final   Staphylococcus aureus NEGATIVE NEGATIVE Final    Comment: (NOTE) The Xpert SA Assay (FDA approved for NASAL specimens in patients 7 years of age and older), is one component of a comprehensive surveillance program. It is not intended to diagnose infection nor to guide or monitor treatment. Performed at [facility], [address], [place], [place] [address]      Studies: CT Angio Chest/Abd/Pel for Dissection W and/or W/WO  Result Date: 09/13/2023 CLINICAL DATA:  Dissection follow-up EXAM: CT ANGIOGRAPHY CHEST, ABDOMEN AND PELVIS TECHNIQUE: Noncontrast chest CT was performed. Multidetector CT imaging through the chest, abdomen and pelvis was performed using the standard protocol during bolus administration of intravenous contrast. Multiplanar reconstructed images and MIPs were obtained and reviewed to evaluate the vascular anatomy. RADIATION DOSE REDUCTION: This exam was performed according to the departmental dose-optimization  program which includes automated exposure control, adjustment of the mA and/or kV according to patient size and/or use of iterative reconstruction technique. CONTRAST:  75mL OMNIPAQUE IOHEXOL 350 MG/ML SOLN COMPARISON:  CTA chest abdomen and pelvis 10/02/2021. CT angiogram chest 09/11/2023. FINDINGS: CTA CHEST FINDINGS Cardiovascular: Again seen is aortic dissection which begins just distal to the takeoff of the left subclavian artery extending to the level of the mid/proximal descending thoracicaorta, unchanged from prior. There is a small amount of contrast filling the false lumen. The descending thoracic aorta is dilated measuring up to 4.3 x 3.8 cm, unchanged from prior examination. The distal descending aorta is nondilated without dissection measuring up to 2.7 cm. The ascending aorta is dilated measuring 4.2 x 4.0 cm, unchanged. There are atherosclerotic calcifications of the aorta. Origin of the great vessels appear patent. The heart is enlarged. Pacemaker is present. There is no pericardial effusion. The main pulmonary artery is enlarged compatible with pulmonary artery hypertension. There is no central pulmonary embolism. Mediastinum/Nodes: No enlarged mediastinal, hilar, or axillary lymph nodes. Thyroid gland, trachea, and esophagus demonstrate no significant findings. There is no mediastinal fluid collection or pneumomediastinum. Lungs/Pleura: There are minimal  patchy ground-glass opacities throughout the lungs bilaterally similar to prior. There is no pleural effusion or pneumothorax. There is bibasilar atelectasis. Musculoskeletal: Sternotomy wires are present. Mild compression deformity of T10 is unchanged. Review of the MIP images confirms the above findings. CTA ABDOMEN AND PELVIS FINDINGS VASCULAR Aorta: Normal caliber aorta without aneurysm, dissection, vasculitis or significant stenosis. There is atherosclerotic calcifications throughout the aorta. Celiac: Patent without evidence of aneurysm,  dissection, vasculitis or significant stenosis. SMA: Patent without evidence of aneurysm, dissection, vasculitis or significant stenosis. Renals: Both renal arteries are patent without evidence of aneurysm, dissection, vasculitis, fibromuscular dysplasia or significant stenosis. IMA: Patent without evidence of aneurysm, dissection, vasculitis or significant stenosis. Inflow: Patent without evidence of aneurysm, dissection, vasculitis or significant stenosis. Veins: No obvious venous abnormality within the limitations of this arterial phase study. Review of the MIP images confirms the above findings. NON-VASCULAR Hepatobiliary: No focal liver abnormality is seen. Status post cholecystectomy. No biliary dilatation. Pancreas: Unremarkable. No pancreatic ductal dilatation or surrounding inflammatory changes. Spleen: Normal in size without focal abnormality. Splenule is again seen. Adrenals/Urinary Tract: Adrenal glands are unremarkable. Kidneys are normal, without renal calculi, focal lesion, or hydronephrosis. Bladder is unremarkable. Stomach/Bowel: Stomach is within normal limits. No evidence of bowel wall thickening, distention, or inflammatory changes. The appendix is not seen. Lymphatic: No enlarged lymph nodes are identified. Reproductive: Uterus and adnexa are within normal limits. Other: No abdominal wall hernia or abnormality. No abdominopelvic ascites. Musculoskeletal: Mild compression deformity of L1 again noted. The bones are diffusely osteopenic. Right hip arthroplasty present. Review of the MIP images confirms the above findings. IMPRESSION: 1. Stable aortic dissection extending from the proximal ascending aorta to the mid/proximal descending thoracic aorta. No change in aortic aneurysm measuring up to 4.3 cm. 2. Stable cardiomegaly. 3. Stable enlargement of the main pulmonary artery compatible with pulmonary artery hypertension. 4. Stable minimal patchy ground-glass opacities throughout the lungs  bilaterally. Findings may be related to edema or infection. 5. No acute localizing process in the abdomen or pelvis. 6. Aortic atherosclerosis. Aortic Atherosclerosis (ICD10-I70.0). Electronically Signed   By: Darliss Cheney M.D.   On: 09/13/2023 20:37    Scheduled Meds:  ALPRAZolam  2 mg Oral BID   atorvastatin  40 mg Oral Once per day on Monday Thursday   chlorhexidine  60 mL Topical Once   Chlorhexidine Gluconate Cloth  6 each Topical Daily   furosemide  40 mg Oral Daily   levothyroxine  75 mcg Oral Q0600   pantoprazole  40 mg Oral Daily   sacubitril-valsartan  1 tablet Oral BID   sodium chloride flush  3 mL Intravenous Q12H   spironolactone  12.5 mg Oral Daily   warfarin  10 mg Oral ONCE-1600   Warfarin - Pharmacist Dosing Inpatient   Does not apply q1600   Continuous Infusions:  sodium chloride     sodium chloride 50 mL/hr at 09/14/23 3664    Principal Problem:   Aortic dissection [facility])   Consultants:  Cardiology  Vascular surgery  EP   Time spent:  40 minutes    Joban Colledge MD [facility] 7 am - 330 pm/M-F for direct patient care and secure chat Please refer to Amion for contact info3  days

## 2023-09-15 NOTE — Progress Notes (Signed)
Patient was offered the prescribed dose of 2mg .  Patient refused, taking only 1.5mg .  Patient states that she "doesn't take 2mg  at home"  That she "doesn't need" the full dose and "doesn't know why" she is on 2mg  of Xanax.  Patient also refused Tivis Ringer, stating that her "heart doctor told her not to take it."

## 2023-09-16 DIAGNOSIS — T82111S Breakdown (mechanical) of cardiac pulse generator (battery), sequela: Secondary | ICD-10-CM | POA: Diagnosis not present

## 2023-09-16 DIAGNOSIS — I5020 Unspecified systolic (congestive) heart failure: Secondary | ICD-10-CM | POA: Diagnosis not present

## 2023-09-16 DIAGNOSIS — T82118D Breakdown (mechanical) of other cardiac electronic device, subsequent encounter: Secondary | ICD-10-CM | POA: Diagnosis not present

## 2023-09-16 LAB — BASIC METABOLIC PANEL
Anion gap: 9 (ref 5–15)
BUN: 11 mg/dL (ref 8–23)
CO2: 27 mmol/L (ref 22–32)
Calcium: 8.7 mg/dL — ABNORMAL LOW (ref 8.9–10.3)
Chloride: 97 mmol/L — ABNORMAL LOW (ref 98–111)
Creatinine, Ser: 1.05 mg/dL — ABNORMAL HIGH (ref 0.44–1.00)
GFR, Estimated: 55 mL/min — ABNORMAL LOW (ref >60)
Glucose, Bld: 110 mg/dL — ABNORMAL HIGH (ref 70–99)
Potassium: 3.2 mmol/L — ABNORMAL LOW (ref 3.5–5.1)
Sodium: 133 mmol/L — ABNORMAL LOW (ref 135–145)

## 2023-09-16 LAB — CBC
HCT: 36.8 % (ref 36.0–46.0)
Hemoglobin: 11.8 g/dL — ABNORMAL LOW (ref 12.0–15.0)
MCH: 28.8 pg (ref 26.0–34.0)
MCHC: 32.1 g/dL (ref 30.0–36.0)
MCV: 89.8 fL (ref 80.0–100.0)
Platelets: 129 10*3/uL — ABNORMAL LOW (ref 150–400)
RBC: 4.1 MIL/uL (ref 3.87–5.11)
RDW: 15.9 % — ABNORMAL HIGH (ref 11.5–15.5)
WBC: 4.8 10*3/uL (ref 4.0–10.5)
nRBC: 0 % (ref 0.0–0.2)

## 2023-09-16 LAB — PROTIME-INR
INR: 1.4 — ABNORMAL HIGH (ref 0.8–1.2)
Prothrombin Time: 17 s — ABNORMAL HIGH (ref 11.4–15.2)

## 2023-09-16 LAB — HEPARIN LEVEL (UNFRACTIONATED): Heparin Unfractionated: 0.61 [IU]/mL (ref 0.30–0.70)

## 2023-09-16 MED ORDER — WARFARIN SODIUM 5 MG PO TABS
10.0000 mg | ORAL_TABLET | Freq: Once | ORAL | Status: AC
  Administered 2023-09-16: 10 mg via ORAL
  Filled 2023-09-16: qty 2

## 2023-09-16 NOTE — Progress Notes (Addendum)
PHARMACY - ANTICOAGULATION CONSULT NOTE  Pharmacy Consult for Heparin Indication: atrial fibrillation, mechanical valve  Patient Measurements: Weight: 54.9 kg (121 lb) Heparin Dosing Weight: total weight  Vital Signs: Temp: 98 F (36.7 C) (11/28 0823) Temp Source: Oral (11/28 0823) BP: 119/69 (11/28 0823) Pulse Rate: 65 (11/28 0823)  Labs: Recent Labs    09/14/23 0127 09/14/23 1711 09/15/23 0500 09/15/23 1127 09/15/23 2000 09/16/23 0252  HGB 12.3  --  11.5*  --   --  11.8*  HCT 38.9  --  36.1  --   --  36.8  PLT 183  --  141*  --   --  129*  LABPROT 16.5*  --  16.3* 15.6*  --  17.0*  INR 1.3*  --  1.3* 1.2  --  1.4*  HEPARINUNFRC 0.28*   < > 0.44  --  0.36 0.61  CREATININE 1.00  --  0.89  --   --  1.05*   < > = values in this interval not displayed.    CrCl cannot be calculated (Unknown ideal weight.).  Assessment: 77 yo female on chronic warfarin for pAF, mechanical MVR, for PPM generator change and possible BiV upgrade. Patient also with aortic dissection  - VVS following.  INR > 6 on 11/23, was given Vit K and transferred to [facility].  INR is subtherapeutic at 1.4 this morning, heparin level therapeutic at 0.61 on 800 units/hr  Home warfarin regimen: 7.5mg /day   Goal of Therapy:  *Current INR goal is 1.8 for procedure 11/29 (Per EP)* Heparin level 0.3-0.7 units/ml Monitor platelets by anticoagulation protocol: Yes   Plan:  -Continue heparin drip at 800 units/hr -Repeat Warfarin 10mg  po x 1 -Daily PT/INR/CBC/Heparin level -heparin set to end 09/17/23 @ 0259 -f/u adjust INR goal post procedure   Thank you for allowing pharmacy to be a part of this patient's care.   Signe Colt, PharmD 09/16/2023 9:30 AM  **Pharmacist phone directory can be found on [email or web address] listed under [facility]**

## 2023-09-16 NOTE — Progress Notes (Signed)
[facility] PROGRESS NOTE  [name] [name] JSE:[id number] DOB: [birth year] DOA: 09/12/2023 PCP: Marylen Ponto, MD  Status is: Inpatient   Barriers to discharge: Social:  Clinical:  Level of care: Progressive   Code Status: Full Family Communication: Patient DVT prophylaxis:  COVID vaccination status:   Subjective: Sitting in the chair.  Well-dressed.  Denies any shortness of breath.   Objective: Vitals:   09/16/23 0823 09/16/23 1153  BP: 119/69 124/68  Pulse: 65 66  Resp: 18 20  Temp: 98 F (36.7 C) 98.1 F (36.7 C)  SpO2: 97% 95%    Intake/Output Summary (Last 24 hours) at 09/16/2023 1305 Last data filed at 09/16/2023 0845 Gross per 24 hour  Intake 359.87 ml  Output 800 ml  Net -440.13 ml    Filed Weights   09/14/23 0613 09/15/23 0418 09/16/23 0559  Weight: 55.5 kg 55.5 kg 54.9 kg    Exam:  Constitutional: NAD, calm, comfortable Eyes: PERRL, lids and conjunctivae normal ENMT: Mucous membranes are moist. Posterior pharynx clear of any exudate or lesions.Normal dentition.  Neck: normal, supple, no masses, no thyromegaly Respiratory: clear to auscultation bilaterally, no wheezing, no crackles. Normal respiratory effort. No accessory muscle use.  Cardiovascular: Regular rate and rhythm, no murmurs / rubs / gallops. No extremity edema. 2+ pedal pulses. No carotid bruits.  Abdomen: no tenderness, no masses palpated. No hepatosplenomegaly. Bowel sounds positive.  Musculoskeletal: no clubbing / cyanosis. No joint deformity upper and lower extremities. Good ROM, no contractures. Normal muscle tone.  Skin: no rashes, lesions, ulcers. No induration Neurologic: CN 2-12 grossly intact. Sensation intact, DTR normal. Strength 5/5 x all 4 extremities.  Psychiatric: Normal judgment and insight. Alert and oriented x 3. Normal mood.    Assessment/Plan:  77 year old female with history of mechanical mitral bowel about 20 years ago, HFrEF, UT aneurysm, CKD  stage 3, hypothyroidism presents to the emergency department with complaints of  back pain,shortness of breath.  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  Patient was started on a esmolol, diltiazem drip.  CT surgery is consulted.    Aortic dissection - consulted vascular surgery -continued with Cardizem drip - close monitoring.   -plan for repeat CT angiogram -stable aortic dissection extending from the proximal ascending aorta to the mid proximal descending thoracic aorta -Recommended patient to follow-up as an outpatient in a month  Supratherapeutic INR -holding Coumadin - admitted with INR of 6.1. -Trended down to 1.3  Mechanical mitral valve -Currently on heparin drip  Chronic HFrEF - holding Lasix for now  Tachy-brady syndrome  -s/p pacemaker placement in February 2009 -interrogated  pacemaker -reached its end stage of the generator. plan to do generator change on Friday 11/29   Diabetes mellitus with hyperglycemia -follow up on sliding scale insulin. -Get hemoglobin A1c  Hypokalemia  - replaced by mouth.    Hypothyroidism - check TSH - continue with Synthroid.    Paroxysmal atrial fibrillation -s/p ablation  Valvular heart disease -history of TAVR, MVR mechanical valve due to rheumatic heart disease  Polysubstance abuse -urine drug screen was positive for marijuana, methadone, opiates -Patient states he took some marijuana gummy small size.       Data Reviewed: Basic Metabolic Panel: Recent Labs  Lab 09/12/23 0803 09/12/23 1421 09/13/23 0233 09/14/23 0127 09/15/23 0500 09/16/23 0252  NA 133* 131* 130* 131* 136 133*  K 2.7* 4.8 3.5 4.3 3.4* 3.2*  CL 96* 97* 99 97*  103 97*  CO2 25 25 25 25 27 27   GLUCOSE 109* 118* 101* 87 100* 110*  BUN 11 12 14 16  7* 11  CREATININE 1.21* 1.02* 0.92 1.00 0.89 1.05*  CALCIUM 8.9 8.8* 8.2* 8.9 8.4* 8.7*  MG 2.3  --  2.5* 2.2  --   --    PHOS 2.1*  --  3.0  --   --   --    Liver Function Tests: Recent Labs  Lab 09/12/23 0803  AST 25  ALT 14  ALKPHOS 71  BILITOT 1.5*  PROT 6.9  ALBUMIN 3.9   No results for input(s): "LIPASE", "AMYLASE" in the last 168 hours. No results for input(s): "AMMONIA" in the last 168 hours. CBC: Recent Labs  Lab 09/12/23 0803 09/13/23 0233 09/14/23 0127 09/15/23 0500 09/16/23 0252  WBC 6.7 5.1 7.3 5.9 4.8  HGB 13.2 12.4 12.3 11.5* 11.8*  HCT 39.5 38.0 38.9 36.1 36.8  MCV 85.1 89.2 90.0 91.4 89.8  PLT 201 165 183 141* 129*   Cardiac Enzymes: No results for input(s): "CKTOTAL", "CKMB", "CKMBINDEX", "TROPONINI" in the last 168 hours. BNP (last 3 results) Recent Labs    09/12/23 1200  BNP 419.8*    ProBNP (last 3 results) No results for input(s): "PROBNP" in the last 8760 hours.  CBG: Recent Labs  Lab 09/12/23 1623 09/12/23 1949 09/12/23 2342 09/13/23 0405 09/13/23 0823  GLUCAP 108* 110* 124* 92 158*    Recent Results (from the past 240 hour(s))  Surgical PCR screen     Status: None   Collection Time: 09/14/23  6:28 AM   Specimen: Nasal Mucosa; Nasal Swab  Result Value Ref Range Status   MRSA, PCR NEGATIVE NEGATIVE Final   Staphylococcus aureus NEGATIVE NEGATIVE Final    Comment: (NOTE) The Xpert SA Assay (FDA approved for NASAL specimens in patients 37 years of age and older), is one component of a comprehensive surveillance program. It is not intended to diagnose infection nor to guide or monitor treatment. Performed at [facility], [address] [place], [place] [address]      Studies: No results found.  Scheduled Meds:  ALPRAZolam  2 mg Oral BID   atorvastatin  40 mg Oral Once per day on Monday Thursday   furosemide  40 mg Oral Daily   levothyroxine  75 mcg Oral Q0600   pantoprazole  40 mg Oral Daily   sacubitril-valsartan  1 tablet Oral BID   spironolactone  12.5 mg Oral Daily   warfarin  10 mg Oral ONCE-1600   Warfarin - Pharmacist  Dosing Inpatient   Does not apply q1600   Continuous Infusions:  heparin 800 Units/hr (09/15/23 1448)    Principal Problem:   Aortic dissection [facility]) Active Problems:   Pacemaker failure   Consultants:  Cardiology  Vascular surgery  EP   Time spent:  40 minutes    Kota Ciancio MD [facility] 7 am - 330 pm/M-F for direct patient care and secure chat Please refer to Amion for contact info4  days

## 2023-09-16 NOTE — Plan of Care (Signed)
  Problem: Education: Goal: Knowledge of General Education information will improve Description: Including pain rating scale, medication(s)/side effects and non-pharmacologic comfort measures Outcome: Progressing   Problem: Health Behavior/Discharge Planning: Goal: Ability to manage health-related needs will improve Outcome: Progressing   Problem: Clinical Measurements: Goal: Ability to maintain clinical measurements within normal limits will improve Outcome: Progressing Goal: Will remain free from infection Outcome: Progressing Goal: Diagnostic test results will improve Outcome: Progressing Goal: Respiratory complications will improve Outcome: Progressing Goal: Cardiovascular complication will be avoided Outcome: Progressing   Problem: Activity: Goal: Risk for activity intolerance will decrease Outcome: Progressing   Problem: Nutrition: Goal: Adequate nutrition will be maintained Outcome: Progressing   Problem: Coping: Goal: Level of anxiety will decrease Outcome: Progressing   Problem: Elimination: Goal: Will not experience complications related to bowel motility Outcome: Progressing Goal: Will not experience complications related to urinary retention Outcome: Progressing   Problem: Pain Management: Goal: General experience of comfort will improve Outcome: Progressing   Problem: Safety: Goal: Ability to remain free from injury will improve Outcome: Progressing   Problem: Skin Integrity: Goal: Risk for impaired skin integrity will decrease Outcome: Progressing   Problem: Education: Goal: Ability to describe self-care measures that may prevent or decrease complications (Diabetes Survival Skills Education) will improve Outcome: Progressing Goal: Individualized Educational Video(s) Outcome: Progressing   Problem: Coping: Goal: Ability to adjust to condition or change in health will improve Outcome: Progressing   Problem: Fluid Volume: Goal: Ability to  maintain a balanced intake and output will improve Outcome: Progressing   Problem: Health Behavior/Discharge Planning: Goal: Ability to identify and utilize available resources and services will improve Outcome: Progressing Goal: Ability to manage health-related needs will improve Outcome: Progressing   Problem: Metabolic: Goal: Ability to maintain appropriate glucose levels will improve Outcome: Progressing   Problem: Nutritional: Goal: Maintenance of adequate nutrition will improve Outcome: Progressing Goal: Progress toward achieving an optimal weight will improve Outcome: Progressing   Problem: Skin Integrity: Goal: Risk for impaired skin integrity will decrease Outcome: Progressing   Problem: Tissue Perfusion: Goal: Adequacy of tissue perfusion will improve Outcome: Progressing   Problem: Education: Goal: Knowledge of cardiac device and self-care will improve Outcome: Progressing Goal: Ability to safely manage health related needs after discharge will improve Outcome: Progressing Goal: Individualized Educational Video(s) Outcome: Progressing   Problem: Cardiac: Goal: Ability to achieve and maintain adequate cardiopulmonary perfusion will improve Outcome: Progressing

## 2023-09-16 NOTE — Progress Notes (Signed)
Progress Note  Patient Name: [name] [name] Date of Encounter: 09/16/2023  Primary Cardiologist: Norman Herrlich, MD   Subjective   No complaints today. Dyspnea at baseline.   Inpatient Medications    Scheduled Meds:  ALPRAZolam  2 mg Oral BID   atorvastatin  40 mg Oral Once per day on Monday Thursday   furosemide  40 mg Oral Daily   levothyroxine  75 mcg Oral Q0600   pantoprazole  40 mg Oral Daily   sacubitril-valsartan  1 tablet Oral BID   spironolactone  12.5 mg Oral Daily   Warfarin - Pharmacist Dosing Inpatient   Does not apply q1600   Continuous Infusions:  heparin 800 Units/hr (09/15/23 1448)   PRN Meds: acetaminophen, albuterol, cyclobenzaprine, docusate sodium, mouth rinse, oxyCODONE, polyethylene glycol   Vital Signs    Vitals:   09/15/23 2054 09/15/23 2328 09/16/23 0559 09/16/23 0823  BP: 122/73 (!) 157/84  119/69  Pulse: 65 65  65  Resp: 16 14 18 18   Temp: 98.1 F (36.7 C) 98 F (36.7 C)  98 F (36.7 C)  TempSrc: Oral Oral  Oral  SpO2: 95% 93%  97%  Weight:   54.9 kg     Intake/Output Summary (Last 24 hours) at 09/16/2023 0933 Last data filed at 09/16/2023 0547 Gross per 24 hour  Intake 119.87 ml  Output 800 ml  Net -680.13 ml   Filed Weights   09/14/23 0613 09/15/23 0418 09/16/23 0559  Weight: 55.5 kg 55.5 kg 54.9 kg    Telemetry    Intermittent ventricular pacing with probable underlying NSR.  - Personally Reviewed  ECG    none - Personally Reviewed  Physical Exam   GEN: No acute distress.   Neck: No JVD Cardiac: RRR, with mech mitral valve closure Respiratory: Clear to auscultation bilaterally. GI: Soft, nontender, non-distended  MS: No edema; No deformity. Neuro:  Nonfocal  Psych: Normal affect   Labs    Chemistry Recent Labs  Lab 09/12/23 0803 09/12/23 1421 09/14/23 0127 09/15/23 0500 09/16/23 0252  NA 133*   < > 131* 136 133*  K 2.7*   < > 4.3 3.4* 3.2*  CL 96*   < > 97* 103 97*  CO2 25   < > 25 27 27    GLUCOSE 109*   < > 87 100* 110*  BUN 11   < > 16 7* 11  CREATININE 1.21*   < > 1.00 0.89 1.05*  CALCIUM 8.9   < > 8.9 8.4* 8.7*  PROT 6.9  --   --   --   --   ALBUMIN 3.9  --   --   --   --   AST 25  --   --   --   --   ALT 14  --   --   --   --   ALKPHOS 71  --   --   --   --   BILITOT 1.5*  --   --   --   --   GFRNONAA 46*   < > 58* >60 55*  ANIONGAP 12   < > 9 6 9    < > = values in this interval not displayed.     Hematology Recent Labs  Lab 09/14/23 0127 09/15/23 0500 09/16/23 0252  WBC 7.3 5.9 4.8  RBC 4.32 3.95 4.10  HGB 12.3 11.5* 11.8*  HCT 38.9 36.1 36.8  MCV 90.0 91.4 89.8  MCH 28.5 29.1 28.8  MCHC  31.6 31.9 32.1  RDW 15.7* 15.8* 15.9*  PLT 183 141* 129*    Cardiac EnzymesNo results for input(s): "TROPONINI" in the last 168 hours. No results for input(s): "TROPIPOC" in the last 168 hours.   BNP Recent Labs  Lab 09/12/23 1200  BNP 419.8*     DDimer No results for input(s): "DDIMER" in the last 168 hours.   Radiology    No results found.  Cardiac Studies   none  Patient Profile     77 y.o. female admitted with fatigue/malaise/INR ove 6 and PPM at EOL.   Assessment & Plan    INR - her INR is up from 1.2 to 1.4 today. Continue coumadin as per pharmacy. PM at EOL - she will undergo PPM gen change out tomorrow with possible insertion of an LV or left bundle lead if INR continues to come up. No heparin after the PM due to the risk of bleeding. Mech mitral valve - we will hold the iV heparin at 0300 in the morning.      For questions or updates, please contact [facility] Please consult [email or web address] for contact info under Cardiology/STEMI.      Signed, Lewayne Bunting, MD  09/16/2023, 9:33 AM

## 2023-09-16 NOTE — Progress Notes (Signed)
Patient Name: [name] [name] Date of Encounter: 09/16/2023 [facility] Cardiologist: Norman Herrlich, MD   Interval Summary  .    Feeling well. No CP/SOB.  Vital Signs .    Vitals:   09/15/23 2054 09/15/23 2328 09/16/23 0559 09/16/23 0823  BP: 122/73 (!) 157/84  119/69  Pulse: 65 65  65  Resp: 16 14 18 18   Temp: 98.1 F (36.7 C) 98 F (36.7 C)  98 F (36.7 C)  TempSrc: Oral Oral  Oral  SpO2: 95% 93%  97%  Weight:   54.9 kg     Intake/Output Summary (Last 24 hours) at 09/16/2023 0844 Last data filed at 09/16/2023 0547 Gross per 24 hour  Intake 119.87 ml  Output 800 ml  Net -680.13 ml      09/16/2023    5:59 AM 09/15/2023    4:18 AM 09/14/2023    6:13 AM  Last 3 Weights  Weight (lbs) 121 lb 122 lb 6.4 oz 122 lb 6.4 oz  Weight (kg) 54.885 kg 55.52 kg 55.52 kg      Telemetry/ECG    SR.  VP - Personally Reviewed  Echo 09/13/23:  1. Left ventricular ejection fraction, by estimation, is 30 to 35%. The  left ventricle has moderately decreased function. The left ventricle  demonstrates global hypokinesis. There is mild concentric left ventricular  hypertrophy. Left ventricular  diastolic parameters are indeterminate.   2. Right ventricular systolic function is mildly reduced. The right  ventricular size is normal. There is mildly elevated pulmonary artery  systolic pressure.   3. Left atrial size was severely dilated.   4. Right atrial size was moderately dilated.   5. MV peak gradient with MVA by P1/2t 4.2cm2. The mitral valve has  been repaired/replaced. No evidence of mitral valve regurgitation. No  evidence of mitral stenosis.   6. Tricuspid valve regurgitation is moderate.   7. The aortic valve has been repaired/replaced. Aortic valve  regurgitation is not visualized. No aortic stenosis is present. Echo  findings are consistent with normal structure and function of the aortic  valve prosthesis. Aortic valve area, by VTI  measures 2.88  cm. Aortic valve mean gradient measures 4.0 mmHg. Aortic  valve Vmax measures 1.42 m/s.   8. The inferior vena cava is normal in size with greater than 50%  respiratory variability, suggesting right atrial pressure of 3 mmHg.   Physical Exam .   GEN: No acute distress.   Neck: No JVD Cardiac: RRR, no murmurs, rubs, or gallops.  Respiratory: Clear to auscultation bilaterally. GI: Soft, nontender, non-distended  MS: No edema  Assessment & Plan .     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  # Aortic dissection: Evaluated by Vascular Surgery and thought to be chronic.  Medical management and consideration for surgery in 1 month.  # CAD: # Hyperlipidemia: Prior PCI.  Stable at last cath 09/2021.  Cotninue aspirin and statin.  # Hypertension:  BP controlled. She declines Entresto.  # HFrEF: LVEF 30-35% this admission.  She continues to refuse Entresto.  BP well-controlled off antihypertensives.  She is euvolemic.  Would try to add low dose beta blocker if patient will permit to lower risk of further dissection.    # PPM:  Medtronic dual chamber PPM at [facility].  She  will likely undergo generator change vs BiV upgrade on 11/29.  # PAF: # Supratherapeutic INR: INR 6.4 on admission.  Now subtherapeutic.  Continue heparin.    For questions or updates, please contact  [facility] Please consult [email or web address] for contact info under        Signed, Chilton Si, MD

## 2023-09-16 NOTE — Plan of Care (Signed)
POC progressing.  

## 2023-09-17 ENCOUNTER — Other Ambulatory Visit: Payer: Self-pay

## 2023-09-17 ENCOUNTER — Inpatient Hospital Stay (HOSPITAL_COMMUNITY): Payer: Medicare Other

## 2023-09-17 ENCOUNTER — Inpatient Hospital Stay (HOSPITAL_COMMUNITY)
Admission: AD | Disposition: A | Discharge status: Home Health | Payer: Self-pay | Source: Other Acute Inpatient Hospital | Attending: Internal Medicine

## 2023-09-17 DIAGNOSIS — Z45010 Encounter for checking and testing of cardiac pacemaker pulse generator [battery]: Secondary | ICD-10-CM | POA: Diagnosis not present

## 2023-09-17 DIAGNOSIS — T82110D Breakdown (mechanical) of cardiac electrode, subsequent encounter: Secondary | ICD-10-CM | POA: Diagnosis not present

## 2023-09-17 DIAGNOSIS — I420 Dilated cardiomyopathy: Secondary | ICD-10-CM | POA: Diagnosis not present

## 2023-09-17 HISTORY — PX: BIV UPGRADE: EP1202

## 2023-09-17 HISTORY — PX: PPM GENERATOR CHANGEOUT: EP1233

## 2023-09-17 LAB — CBC
HCT: 34.7 % — ABNORMAL LOW (ref 36.0–46.0)
Hemoglobin: 11.1 g/dL — ABNORMAL LOW (ref 12.0–15.0)
MCH: 29.1 pg (ref 26.0–34.0)
MCHC: 32 g/dL (ref 30.0–36.0)
MCV: 90.8 fL (ref 80.0–100.0)
Platelets: 114 10*3/uL — ABNORMAL LOW (ref 150–400)
RBC: 3.82 MIL/uL — ABNORMAL LOW (ref 3.87–5.11)
RDW: 15.8 % — ABNORMAL HIGH (ref 11.5–15.5)
WBC: 3.8 10*3/uL — ABNORMAL LOW (ref 4.0–10.5)
nRBC: 0 % (ref 0.0–0.2)

## 2023-09-17 LAB — BASIC METABOLIC PANEL
Anion gap: 7 (ref 5–15)
BUN: 11 mg/dL (ref 8–23)
CO2: 28 mmol/L (ref 22–32)
Calcium: 8.4 mg/dL — ABNORMAL LOW (ref 8.9–10.3)
Chloride: 100 mmol/L (ref 98–111)
Creatinine, Ser: 1.01 mg/dL — ABNORMAL HIGH (ref 0.44–1.00)
GFR, Estimated: 58 mL/min — ABNORMAL LOW (ref >60)
Glucose, Bld: 93 mg/dL (ref 70–99)
Potassium: 3.2 mmol/L — ABNORMAL LOW (ref 3.5–5.1)
Sodium: 135 mmol/L (ref 135–145)

## 2023-09-17 LAB — PROTIME-INR
INR: 1.8 — ABNORMAL HIGH (ref 0.8–1.2)
Prothrombin Time: 20.9 s — ABNORMAL HIGH (ref 11.4–15.2)

## 2023-09-17 LAB — HEPARIN LEVEL (UNFRACTIONATED): Heparin Unfractionated: 0.54 [IU]/mL (ref 0.30–0.70)

## 2023-09-17 SURGERY — PPM GENERATOR CHANGEOUT

## 2023-09-17 MED ORDER — CEFAZOLIN SODIUM-DEXTROSE 2-4 GM/100ML-% IV SOLN
INTRAVENOUS | Status: AC
  Administered 2023-09-17: 2 g via INTRAVENOUS
  Filled 2023-09-17: qty 100

## 2023-09-17 MED ORDER — LIDOCAINE HCL (PF) 1 % IJ SOLN
INTRAMUSCULAR | Status: AC
  Filled 2023-09-17: qty 60

## 2023-09-17 MED ORDER — CHLORHEXIDINE GLUCONATE 4 % EX SOLN: 60.0000 mL | Freq: Once | CUTANEOUS | Status: DC

## 2023-09-17 MED ORDER — LIDOCAINE HCL (PF) 1 % IJ SOLN
INTRAMUSCULAR | Status: DC | PRN
  Administered 2023-09-17: 45 mL

## 2023-09-17 MED ORDER — ONDANSETRON HCL 4 MG/2ML IJ SOLN: 4.0000 mg | Freq: Four times a day (QID) | INTRAMUSCULAR | Status: DC | PRN

## 2023-09-17 MED ORDER — MIDAZOLAM HCL 5 MG/5ML IJ SOLN
INTRAMUSCULAR | Status: DC | PRN
  Administered 2023-09-17 (×3): 1 mg via INTRAVENOUS

## 2023-09-17 MED ORDER — SODIUM CHLORIDE 0.9 % IV SOLN: INTRAVENOUS | Status: DC

## 2023-09-17 MED ORDER — MIDAZOLAM HCL 5 MG/5ML IJ SOLN
INTRAMUSCULAR | Status: AC
  Filled 2023-09-17: qty 5

## 2023-09-17 MED ORDER — WARFARIN SODIUM 5 MG PO TABS
10.0000 mg | ORAL_TABLET | Freq: Once | ORAL | Status: DC
  Filled 2023-09-17: qty 2

## 2023-09-17 MED ORDER — CEFAZOLIN SODIUM-DEXTROSE 2-4 GM/100ML-% IV SOLN: 2.0000 g | INTRAVENOUS | Status: DC

## 2023-09-17 MED ORDER — SODIUM CHLORIDE 0.9 % IV SOLN: INTRAVENOUS | Status: AC

## 2023-09-17 MED ORDER — CHLORHEXIDINE GLUCONATE 4 % EX SOLN
4.0000 | Freq: Once | CUTANEOUS | Status: DC
Start: 2023-09-17 — End: 2023-09-17

## 2023-09-17 MED ORDER — POTASSIUM CHLORIDE 10 MEQ/100ML IV SOLN
10.0000 meq | INTRAVENOUS | Status: DC
  Administered 2023-09-17: 10 meq via INTRAVENOUS
  Filled 2023-09-17: qty 100

## 2023-09-17 MED ORDER — SODIUM CHLORIDE 0.9% FLUSH
3.0000 mL | Freq: Two times a day (BID) | INTRAVENOUS | Status: DC
  Administered 2023-09-17 – 2023-09-18 (×2): 3 mL via INTRAVENOUS

## 2023-09-17 MED ORDER — SODIUM CHLORIDE 0.9 % IV SOLN
INTRAVENOUS | Status: AC
  Filled 2023-09-17: qty 2

## 2023-09-17 MED ORDER — WARFARIN SODIUM 5 MG PO TABS
10.0000 mg | ORAL_TABLET | Freq: Once | ORAL | Status: AC
  Administered 2023-09-17: 10 mg via ORAL

## 2023-09-17 MED ORDER — SODIUM CHLORIDE 0.9 % IV SOLN: 250.0000 mL | INTRAVENOUS | Status: DC

## 2023-09-17 MED ORDER — FENTANYL CITRATE (PF) 100 MCG/2ML IJ SOLN
INTRAMUSCULAR | Status: DC | PRN
  Administered 2023-09-17 (×3): 25 ug via INTRAVENOUS

## 2023-09-17 MED ORDER — POTASSIUM CHLORIDE 10 MEQ/100ML IV SOLN
10.0000 meq | INTRAVENOUS | Status: AC
  Administered 2023-09-17 (×2): 10 meq via INTRAVENOUS
  Filled 2023-09-17 (×2): qty 100

## 2023-09-17 MED ORDER — CEFAZOLIN SODIUM-DEXTROSE 1-4 GM/50ML-% IV SOLN
1.0000 g | Freq: Four times a day (QID) | INTRAVENOUS | Status: AC
  Administered 2023-09-17 – 2023-09-18 (×3): 1 g via INTRAVENOUS
  Filled 2023-09-17 (×3): qty 50

## 2023-09-17 MED ORDER — HEPARIN (PORCINE) IN NACL 1000-0.9 UT/500ML-% IV SOLN
INTRAVENOUS | Status: DC | PRN
  Administered 2023-09-17 (×2): 500 mL

## 2023-09-17 MED ORDER — MIDAZOLAM HCL 2 MG/2ML IJ SOLN
INTRAMUSCULAR | Status: AC
Start: 2023-09-17 — End: ?
  Filled 2023-09-17: qty 2

## 2023-09-17 MED ORDER — CEFAZOLIN SODIUM-DEXTROSE 2-4 GM/100ML-% IV SOLN
INTRAVENOUS | Status: AC
  Administered 2023-09-17: 2 g
  Filled 2023-09-17: qty 100

## 2023-09-17 MED ORDER — SODIUM CHLORIDE 0.9% FLUSH: 3.0000 mL | INTRAVENOUS | Status: DC | PRN

## 2023-09-17 MED ORDER — CEFAZOLIN SODIUM-DEXTROSE 2-4 GM/100ML-% IV SOLN: 2.0000 g | INTRAVENOUS | Status: AC

## 2023-09-17 MED ORDER — SODIUM CHLORIDE 0.9 % IV SOLN: 80.0000 mg | INTRAVENOUS | Status: DC

## 2023-09-17 MED ORDER — SODIUM CHLORIDE 0.9 % IV SOLN
80.0000 mg | INTRAVENOUS | Status: AC
  Administered 2023-09-17: 80 mg
  Filled 2023-09-17: qty 2

## 2023-09-17 MED ORDER — IOHEXOL 350 MG/ML SOLN
INTRAVENOUS | Status: DC | PRN
  Administered 2023-09-17 (×2): 10 mL

## 2023-09-17 MED ORDER — FENTANYL CITRATE (PF) 100 MCG/2ML IJ SOLN
INTRAMUSCULAR | Status: AC
  Filled 2023-09-17: qty 2

## 2023-09-17 MED ORDER — LIDOCAINE HCL 1 % IJ SOLN
INTRAMUSCULAR | Status: AC
Start: 2023-09-17 — End: ?
  Filled 2023-09-17: qty 60

## 2023-09-17 SURGICAL SUPPLY — 28 items
BALLN COR SINUS VENO 6FR 80 (BALLOONS) ×1 IMPLANT
BALLOON COR SINUS VENO 6FR 80 (BALLOONS) IMPLANT
CABLE SURGICAL S-101-97-12 (CABLE) ×1 IMPLANT
CATH ATTAIN COMMAND 6250-MB2 (CATHETERS) IMPLANT
CATH CPS DIRECT 135 DS2C020 (CATHETERS) IMPLANT
CATH RIGHTSITE C315HIS02 (CATHETERS) IMPLANT
DEVICE DISSECT PLASMABLAD 3.0S (MISCELLANEOUS) IMPLANT
HEMOSTAT SURGICEL 2X4 FIBR (HEMOSTASIS) IMPLANT
KIT ESSENTIALS PG (KITS) IMPLANT
KIT MICROPUNCTURE NIT STIFF (SHEATH) IMPLANT
KIT WRENCH (KITS) IMPLANT
LEAD ATTAIN PERFORM ST 4398-88 (Lead) IMPLANT
LEAD QUARTET 1458Q-86CM (Lead) IMPLANT
LEAD SELECT SECURE 3830 383069 (Lead) IMPLANT
PACEMAKER PRCT MRI CRTP W1TR01 (Pacemaker) IMPLANT
PAD DEFIB RADIO PHYSIO CONN (PAD) ×1 IMPLANT
PLASMABLADE 3.0S (MISCELLANEOUS) ×2 IMPLANT
POUCH AIGIS-R ANTIBACT PPM (Mesh General) ×1 IMPLANT
POUCH AIGIS-R ANTIBACT PPM MED (Mesh General) IMPLANT
PPM PRECEPTA MRI CRT-P W1TR01 (Pacemaker) ×1 IMPLANT
SELECT SECURE 3830 383069 (Lead) ×3 IMPLANT
SHEATH 7FR PRELUDE SNAP 13 (SHEATH) IMPLANT
SHEATH 7FR PRELUDE SNAP 25 (SHEATH) IMPLANT
SHEATH 9.5FR PRELUDE SNAP 13 (SHEATH) IMPLANT
SLITTER 6232ADJ (MISCELLANEOUS) IMPLANT
TRAY PACEMAKER INSERTION (PACKS) ×1 IMPLANT
WIRE ACUITY WHISPER EDS 4648 (WIRE) IMPLANT
WIRE HI TORQ VERSACORE-J 145CM (WIRE) IMPLANT

## 2023-09-17 NOTE — Progress Notes (Signed)
Retrieved patient valuables from security. Per pt daughter [name] [name] to take home with her. Daughter verified item (money) and signed for it. Thomas Hoff, RN

## 2023-09-17 NOTE — Discharge Instructions (Signed)
After Your Pacemaker   You have a Medtronic Pacemaker  ACTIVITY Do not lift your arm above shoulder height for 1 week after your procedure. After 7 days, you may progress as below.  You should remove your sling 24 hours after your procedure, unless otherwise instructed by your provider.     Friday September 24, 2023  Saturday September 25, 2023 Sunday September 26, 2023 Monday September 27, 2023   Do not lift, push, pull, or carry anything over 10 pounds with the affected arm until 6 weeks (Friday October 29, 2023 ) after your procedure.   You may drive AFTER your wound check, unless you have been told otherwise by your provider.   Ask your healthcare provider when you can go back to work   INCISION/Dressing Continue your coumadin as prescribed.   If large square, outer bandage is left in place, this can be removed after 24 hours from your procedure. Do not remove steri-strips or glue as below.   If a PRESSURE DRESSING (a bulky dressing that usually goes up over your shoulder) was applied or left in place, please follow instructions given by your provider on when to return to have this removed.   Monitor your Pacemaker site for redness, swelling, and drainage. Call the device clinic at [phone] if you experience these symptoms or fever/chills.  If your incision is closed with Dermabond/Surgical glue. You may shower 1 day after your pacemaker implant and wash around the site with soap and water.    If you were discharged in a sling, please do not wear this during the day more than 48 hours after your surgery unless otherwise instructed. This may increase the risk of stiffness and soreness in your shoulder.   Avoid lotions, ointments, or perfumes over your incision until it is well-healed.  You may use a hot tub or a pool AFTER your wound check appointment if the incision is completely closed.  Pacemaker Alerts:  Some alerts are vibratory and others beep. These are NOT emergencies.  Please call our office to let us know. If this occurs at night or on weekends, it can wait until the next business day. Send a remote transmission.  If your device is capable of reading fluid status (for heart failure), you will be offered monthly monitoring to review this with you.   DEVICE MANAGEMENT Remote monitoring is used to monitor your pacemaker from home. This monitoring is scheduled every 91 days by our office. It allows [place] to keep an eye on the functioning of your device to ensure it is working properly. You will routinely see your Electrophysiologist annually (more often if necessary).   You should receive your ID card for your new device in 4-8 weeks. Keep this card with you at all times once received. Consider wearing a medical alert bracelet or necklace.  Your Pacemaker may be MRI compatible. This will be discussed at your next office visit/wound check.  You should avoid contact with strong electric or magnetic fields.   Do not use amateur (ham) radio equipment or electric (arc) welding torches. MP3 player headphones with magnets should not be used. Some devices are safe to use if held at least 12 inches (30 cm) from your Pacemaker. These include power tools, lawn mowers, and speakers. If you are unsure if something is safe to use, ask your health care provider.  When using your cell phone, hold it to the ear that is on the opposite side from the Pacemaker. Do not leave  your cell phone in a pocket over the Pacemaker.  You may safely use electric blankets, heating pads, computers, and microwave ovens.  Call the office right away if: You have chest pain. You feel more short of breath than you have felt before. You feel more light-headed than you have felt before. Your incision starts to open up.  This information is not intended to replace advice given to you by your health care provider. Make sure you discuss any questions you have with your health care provider.

## 2023-09-17 NOTE — Progress Notes (Signed)
[facility] PROGRESS NOTE  [name] [name] HQI:[id number] DOB: [birth year] DOA: 09/12/2023 PCP: Marylen Ponto, MD  Status is: Inpatient   Barriers to discharge: Social:  Clinical:  Level of care: Progressive   Code Status: Full Family Communication: Patient DVT prophylaxis:  COVID vaccination status:   Subjective: Denies having any complaints.  Looking forward for the procedure done   Objective: Vitals:   09/17/23 0619 09/17/23 0835  BP:  121/66  Pulse:  65  Resp: 20 19  Temp:  98.1 F (36.7 C)  SpO2:      Intake/Output Summary (Last 24 hours) at 09/17/2023 0941 Last data filed at 09/17/2023 0333 Gross per 24 hour  Intake 649.69 ml  Output --  Net 649.69 ml    Filed Weights   09/15/23 0418 09/16/23 0559 09/17/23 0619  Weight: 55.5 kg 54.9 kg 54.2 kg    Exam:  Constitutional: NAD, calm, comfortable Eyes: PERRL, lids and conjunctivae normal ENMT: Mucous membranes are moist. Posterior pharynx clear of any exudate or lesions.Normal dentition.  Neck: normal, supple, no masses, no thyromegaly Respiratory: clear to auscultation bilaterally, no wheezing, no crackles. Normal respiratory effort. No accessory muscle use.  Cardiovascular: Regular rate and rhythm, no murmurs / rubs / gallops. No extremity edema. 2+ pedal pulses. No carotid bruits.  Abdomen: no tenderness, no masses palpated. No hepatosplenomegaly. Bowel sounds positive.  Musculoskeletal: no clubbing / cyanosis. No joint deformity upper and lower extremities. Good ROM, no contractures. Normal muscle tone.  Skin: no rashes, lesions, ulcers. No induration Neurologic: CN 2-12 grossly intact. Sensation intact, DTR normal. Strength 5/5 x all 4 extremities.  Psychiatric: Normal judgment and insight. Alert and oriented x 3. Normal mood.    Assessment/Plan:  77 year old female with history of mechanical mitral bowel about 20 years ago, HFrEF, UT aneurysm, CKD stage 3, hypothyroidism presents to  the emergency department with complaints of  back pain,shortness of breath.  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  Patient was started on a esmolol, diltiazem drip.  CT surgery is consulted.    Aortic dissection - consulted vascular surgery -continued with Cardizem drip - close monitoring.   -plan for repeat CT angiogram -stable aortic dissection extending from the proximal ascending aorta to the mid proximal descending thoracic aorta -Recommended patient to follow-up as an outpatient in a month  Supratherapeutic INR -holding Coumadin - admitted with INR of 6.1. -Trended down to 1.3  Mechanical mitral valve -Currently on heparin drip  Chronic HFrEF - holding Lasix for now  Tachy-brady syndrome  -s/p pacemaker placement in February 2009 -interrogated  pacemaker -reached its end stage of the generator. plan to do generator change on Friday 11/29  Hypokalemia -Replaced through IV   Diabetes mellitus with hyperglycemia -follow up on sliding scale insulin. -Get hemoglobin A1c-5.6  Hypothyroidism - check TSH-10.3 on 08/11/2022 - continue with Synthroid.    Paroxysmal atrial fibrillation -s/p ablation  Valvular heart disease -history of TAVR, MVR mechanical valve due to rheumatic heart disease  Polysubstance abuse -urine drug screen was positive for marijuana, methadone, opiates -Patient states he took some marijuana gummy small size.       Data Reviewed: Basic Metabolic Panel: Recent Labs  Lab 09/12/23 0803 09/12/23 1421 09/13/23 0233 09/14/23 0127 09/15/23 0500 09/16/23 0252 09/17/23 0313  NA 133*   < > 130* 131* 136 133* 135  K 2.7*   < > 3.5 4.3 3.4* 3.2* 3.2*  CL 96*   < >  99 97* 103 97* 100  CO2 25   < > 25 25 27 27 28   GLUCOSE 109*   < > 101* 87 100* 110* 93  BUN 11   < > 14 16 7* 11 11  CREATININE 1.21*   < > 0.92 1.00 0.89 1.05* 1.01*  CALCIUM 8.9   < > 8.2* 8.9 8.4*  8.7* 8.4*  MG 2.3  --  2.5* 2.2  --   --   --   PHOS 2.1*  --  3.0  --   --   --   --    < > = values in this interval not displayed.   Liver Function Tests: Recent Labs  Lab 09/12/23 0803  AST 25  ALT 14  ALKPHOS 71  BILITOT 1.5*  PROT 6.9  ALBUMIN 3.9   No results for input(s): "LIPASE", "AMYLASE" in the last 168 hours. No results for input(s): "AMMONIA" in the last 168 hours. CBC: Recent Labs  Lab 09/13/23 0233 09/14/23 0127 09/15/23 0500 09/16/23 0252 09/17/23 0313  WBC 5.1 7.3 5.9 4.8 3.8*  HGB 12.4 12.3 11.5* 11.8* 11.1*  HCT 38.0 38.9 36.1 36.8 34.7*  MCV 89.2 90.0 91.4 89.8 90.8  PLT 165 183 141* 129* 114*   Cardiac Enzymes: No results for input(s): "CKTOTAL", "CKMB", "CKMBINDEX", "TROPONINI" in the last 168 hours. BNP (last 3 results) Recent Labs    09/12/23 1200  BNP 419.8*    ProBNP (last 3 results) No results for input(s): "PROBNP" in the last 8760 hours.  CBG: Recent Labs  Lab 09/12/23 1623 09/12/23 1949 09/12/23 2342 09/13/23 0405 09/13/23 0823  GLUCAP 108* 110* 124* 92 158*    Recent Results (from the past 240 hour(s))  Surgical PCR screen     Status: None   Collection Time: 09/14/23  6:28 AM   Specimen: Nasal Mucosa; Nasal Swab  Result Value Ref Range Status   MRSA, PCR NEGATIVE NEGATIVE Final   Staphylococcus aureus NEGATIVE NEGATIVE Final    Comment: (NOTE) The Xpert SA Assay (FDA approved for NASAL specimens in patients 19 years of age and older), is one component of a comprehensive surveillance program. It is not intended to diagnose infection nor to guide or monitor treatment. Performed at [facility], [address], [place], [place] [address]      Studies: No results found.  Scheduled Meds:  ALPRAZolam  2 mg Oral BID   atorvastatin  40 mg Oral Once per day on Monday Thursday   chlorhexidine  4 Application Topical Once   furosemide  40 mg Oral Daily   gentamicin (GARAMYCIN) 80 mg in sodium chloride 0.9 % 500  mL irrigation  80 mg Irrigation On Call   levothyroxine  75 mcg Oral Q0600   pantoprazole  40 mg Oral Daily   sacubitril-valsartan  1 tablet Oral BID   sodium chloride flush  3 mL Intravenous Q12H   spironolactone  12.5 mg Oral Daily   Warfarin - Pharmacist Dosing Inpatient   Does not apply q1600   Continuous Infusions:  sodium chloride 150 mL/hr at 09/17/23 0830   sodium chloride     sodium chloride     sodium chloride     sodium chloride      ceFAZolin (ANCEF) IV     potassium chloride 10 mEq (09/17/23 0842)    Principal Problem:   Aortic dissection [facility]) Active Problems:   Pacemaker failure   Consultants:  Cardiology  Vascular surgery  EP  Time spent:  40 minutes    Heer Justiss MD [facility] 7 am - 330 pm/M-F for direct patient care and secure chat Please refer to Amion for contact info5  days
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
## 2023-09-17 NOTE — Progress Notes (Signed)
Daughter, [name], called and updated on plan of care. Reviewed INR 1.8 today and lab availability.      Canary Brim, MSN, APRN, NP-C, AGACNP-BC [facility] - Electrophysiology  09/17/2023, 9:19 AM

## 2023-09-17 NOTE — Progress Notes (Signed)
Pt refuses entresto and says that she will not take bc she takes synthroid and have afib can not take . Pt resting with call bell within reach.  Will continue to monitor.

## 2023-09-17 NOTE — Progress Notes (Addendum)
PHARMACY - ANTICOAGULATION CONSULT NOTE  Pharmacy Consult for Heparin Indication: atrial fibrillation, mechanical valve  Patient Measurements: Weight: 54.2 kg (119 lb 6.4 oz) Heparin Dosing Weight: total weight  Vital Signs: Temp: 98.5 F (36.9 C) (11/29 0338) Temp Source: Oral (11/29 0338) BP: 98/54 (11/29 0338) Pulse Rate: 65 (11/29 0338)  Labs: Recent Labs    09/15/23 0500 09/15/23 1127 09/15/23 2000 09/16/23 0252 09/17/23 0313  HGB 11.5*  --   --  11.8* 11.1*  HCT 36.1  --   --  36.8 34.7*  PLT 141*  --   --  129* 114*  LABPROT 16.3* 15.6*  --  17.0* 20.9*  INR 1.3* 1.2  --  1.4* 1.8*  HEPARINUNFRC 0.44  --  0.36 0.61 0.54  CREATININE 0.89  --   --  1.05* 1.01*    CrCl cannot be calculated (Unknown ideal weight.).  Assessment: 77 yo female on chronic warfarin for pAF, mechanical MVR, for PPM generator change and possible BiV upgrade on 11/29.  Patient also with aortic dissection  - VVS following.  INR > 6 on 11/23, was given Vit K and transferred to [facility].  Warfarin restarted 11/26, has received 10 mg x3 days thus far.   Home warfarin regimen: 7.5mg /day; pta INR goal 2.5 - 3.5.  INR 1.8 at goal for PPM gen change today per EP.  Plan to continue warfarin this evening after procedure without heparin per Dr. Graciela Husbands.   Goal of Therapy:  INR goal 2.5 - 3.5 (confirmed with EP) Monitor platelets by anticoagulation protocol: Yes   Plan:  -Discontinue heparin consult -Repeat Warfarin 10 mg po x 1 -Daily PT/INR/CBC  Thank you for allowing pharmacy to be a part of this patient's care.   Trixie Rude, PharmD Clinical Pharmacist 09/17/2023  9:48 AM

## 2023-09-17 NOTE — Plan of Care (Signed)
  Problem: Education: Goal: Knowledge of General Education information will improve Description: Including pain rating scale, medication(s)/side effects and non-pharmacologic comfort measures Outcome: Progressing   Problem: Health Behavior/Discharge Planning: Goal: Ability to manage health-related needs will improve Outcome: Progressing   Problem: Clinical Measurements: Goal: Ability to maintain clinical measurements within normal limits will improve Outcome: Progressing Goal: Will remain free from infection Outcome: Progressing Goal: Diagnostic test results will improve Outcome: Progressing Goal: Respiratory complications will improve Outcome: Progressing Goal: Cardiovascular complication will be avoided Outcome: Progressing   Problem: Activity: Goal: Risk for activity intolerance will decrease Outcome: Progressing   Problem: Nutrition: Goal: Adequate nutrition will be maintained Outcome: Progressing   Problem: Coping: Goal: Level of anxiety will decrease Outcome: Progressing   Problem: Elimination: Goal: Will not experience complications related to bowel motility Outcome: Progressing Goal: Will not experience complications related to urinary retention Outcome: Progressing   Problem: Pain Management: Goal: General experience of comfort will improve Outcome: Progressing   Problem: Safety: Goal: Ability to remain free from injury will improve Outcome: Progressing   Problem: Skin Integrity: Goal: Risk for impaired skin integrity will decrease Outcome: Progressing   Problem: Education: Goal: Ability to describe self-care measures that may prevent or decrease complications (Diabetes Survival Skills Education) will improve Outcome: Progressing Goal: Individualized Educational Video(s) Outcome: Progressing   Problem: Coping: Goal: Ability to adjust to condition or change in health will improve Outcome: Progressing   Problem: Fluid Volume: Goal: Ability to  maintain a balanced intake and output will improve Outcome: Progressing   Problem: Health Behavior/Discharge Planning: Goal: Ability to identify and utilize available resources and services will improve Outcome: Progressing Goal: Ability to manage health-related needs will improve Outcome: Progressing   Problem: Metabolic: Goal: Ability to maintain appropriate glucose levels will improve Outcome: Progressing   Problem: Nutritional: Goal: Maintenance of adequate nutrition will improve Outcome: Progressing Goal: Progress toward achieving an optimal weight will improve Outcome: Progressing   Problem: Skin Integrity: Goal: Risk for impaired skin integrity will decrease Outcome: Progressing   Problem: Tissue Perfusion: Goal: Adequacy of tissue perfusion will improve Outcome: Progressing   Problem: Education: Goal: Knowledge of cardiac device and self-care will improve Outcome: Progressing Goal: Ability to safely manage health related needs after discharge will improve Outcome: Progressing Goal: Individualized Educational Video(s) Outcome: Progressing   Problem: Cardiac: Goal: Ability to achieve and maintain adequate cardiopulmonary perfusion will improve Outcome: Progressing

## 2023-09-18 ENCOUNTER — Other Ambulatory Visit (HOSPITAL_COMMUNITY): Payer: Self-pay

## 2023-09-18 ENCOUNTER — Inpatient Hospital Stay (HOSPITAL_COMMUNITY): Payer: Medicare Other

## 2023-09-18 DIAGNOSIS — I495 Sick sinus syndrome: Secondary | ICD-10-CM | POA: Diagnosis not present

## 2023-09-18 DIAGNOSIS — Z45018 Encounter for adjustment and management of other part of cardiac pacemaker: Secondary | ICD-10-CM

## 2023-09-18 DIAGNOSIS — I5022 Chronic systolic (congestive) heart failure: Secondary | ICD-10-CM | POA: Diagnosis not present

## 2023-09-18 DIAGNOSIS — I255 Ischemic cardiomyopathy: Secondary | ICD-10-CM | POA: Diagnosis not present

## 2023-09-18 DIAGNOSIS — I71012 Dissection of descending thoracic aorta: Secondary | ICD-10-CM | POA: Diagnosis not present

## 2023-09-18 DIAGNOSIS — I428 Other cardiomyopathies: Secondary | ICD-10-CM

## 2023-09-18 LAB — BASIC METABOLIC PANEL
Anion gap: 5 (ref 5–15)
BUN: 11 mg/dL (ref 8–23)
CO2: 22 mmol/L (ref 22–32)
Calcium: 8.1 mg/dL — ABNORMAL LOW (ref 8.9–10.3)
Chloride: 109 mmol/L (ref 98–111)
Creatinine, Ser: 0.85 mg/dL (ref 0.44–1.00)
GFR, Estimated: >60 mL/min (ref >60)
Glucose, Bld: 103 mg/dL — ABNORMAL HIGH (ref 70–99)
Potassium: 3.9 mmol/L (ref 3.5–5.1)
Sodium: 136 mmol/L (ref 135–145)

## 2023-09-18 LAB — CBC
HCT: 34.6 % — ABNORMAL LOW (ref 36.0–46.0)
Hemoglobin: 10.5 g/dL — ABNORMAL LOW (ref 12.0–15.0)
MCH: 28.2 pg (ref 26.0–34.0)
MCHC: 30.3 g/dL (ref 30.0–36.0)
MCV: 92.8 fL (ref 80.0–100.0)
Platelets: 96 10*3/uL — ABNORMAL LOW (ref 150–400)
RBC: 3.73 MIL/uL — ABNORMAL LOW (ref 3.87–5.11)
RDW: 16.1 % — ABNORMAL HIGH (ref 11.5–15.5)
WBC: 4.8 10*3/uL (ref 4.0–10.5)
nRBC: 0 % (ref 0.0–0.2)

## 2023-09-18 LAB — PROTIME-INR
INR: 2.1 — ABNORMAL HIGH (ref 0.8–1.2)
Prothrombin Time: 23.8 s — ABNORMAL HIGH (ref 11.4–15.2)

## 2023-09-18 MED ORDER — OXYCODONE HCL 5 MG PO TABS: 5.0000 mg | ORAL_TABLET | Freq: Four times a day (QID) | ORAL | 0 refills | Status: AC | PRN

## 2023-09-18 MED ORDER — SPIRONOLACTONE 25 MG PO TABS
12.5000 mg | ORAL_TABLET | Freq: Every day | ORAL | 0 refills | Status: AC
  Filled 2023-09-18: qty 15, 30d supply, fill #0

## 2023-09-18 MED ORDER — OXYCODONE HCL 5 MG PO TABS: 5.0000 mg | ORAL_TABLET | Freq: Four times a day (QID) | ORAL | 0 refills | Status: DC | PRN

## 2023-09-18 MED ORDER — SACUBITRIL-VALSARTAN 24-26 MG PO TABS
1.0000 | ORAL_TABLET | Freq: Two times a day (BID) | ORAL | 0 refills | Status: DC
  Filled 2023-09-18: qty 60, 30d supply, fill #0

## 2023-09-18 MED ORDER — WARFARIN SODIUM 5 MG PO TABS
7.5000 mg | ORAL_TABLET | Freq: Once | ORAL | Status: AC
  Administered 2023-09-18: 7.5 mg via ORAL
  Filled 2023-09-18: qty 1

## 2023-09-18 NOTE — Plan of Care (Signed)
  Problem: Education: Goal: Knowledge of General Education information will improve Description: Including pain rating scale, medication(s)/side effects and non-pharmacologic comfort measures Outcome: Progressing   Problem: Health Behavior/Discharge Planning: Goal: Ability to manage health-related needs will improve Outcome: Progressing   Problem: Clinical Measurements: Goal: Ability to maintain clinical measurements within normal limits will improve Outcome: Progressing Goal: Will remain free from infection Outcome: Progressing Goal: Diagnostic test results will improve Outcome: Progressing Goal: Respiratory complications will improve Outcome: Progressing Goal: Cardiovascular complication will be avoided Outcome: Progressing   Problem: Activity: Goal: Risk for activity intolerance will decrease Outcome: Progressing   Problem: Nutrition: Goal: Adequate nutrition will be maintained Outcome: Progressing   Problem: Coping: Goal: Level of anxiety will decrease Outcome: Progressing   Problem: Elimination: Goal: Will not experience complications related to bowel motility Outcome: Progressing Goal: Will not experience complications related to urinary retention Outcome: Progressing   Problem: Pain Management: Goal: General experience of comfort will improve Outcome: Progressing   Problem: Safety: Goal: Ability to remain free from injury will improve Outcome: Progressing   Problem: Skin Integrity: Goal: Risk for impaired skin integrity will decrease Outcome: Progressing   Problem: Fluid Volume: Goal: Ability to maintain a balanced intake and output will improve Outcome: Progressing   Problem: Health Behavior/Discharge Planning: Goal: Ability to identify and utilize available resources and services will improve Outcome: Progressing Goal: Ability to manage health-related needs will improve Outcome: Progressing   Problem: Skin Integrity: Goal: Risk for impaired skin  integrity will decrease Outcome: Progressing

## 2023-09-18 NOTE — Progress Notes (Addendum)
Patient Name: [name] [name] Date of Encounter: 09/18/2023  Primary Cardiologist: Norman Herrlich, MD Electrophysiologist: None  Interval Summary    Doing quite well  some post procedure soreness   Vital Signs    Vitals:   09/18/23 0521 09/18/23 0523 09/18/23 0741 09/18/23 0828  BP:   139/71   Pulse: 65  60   Resp: (!) 29 19 15    Temp:   98.2 F (36.8 C)   TempSrc:   Oral   SpO2: 94%  97%   Weight: 57.2 kg     Height:    4\' 11"  (1.499 m)    Intake/Output Summary (Last 24 hours) at 09/18/2023 1243 Last data filed at 09/18/2023 4098 Gross per 24 hour  Intake 1086.33 ml  Output --  Net 1086.33 ml   Filed Weights   09/16/23 0559 09/17/23 0619 09/18/23 0521  Weight: 54.9 kg 54.2 kg 57.2 kg    Physical Exam    Well developed and nourished in no acute distress HENT normal Neck supple Pocket without hematoma, swelling or tenderness  Clear Regular rate and rhythm, no murmurs or gallops Abd-soft with active BS No Clubbing cyanosis edema Skin-warm and dry A & Oriented  Grossly normal sensory and motor function  ECG sinus P-synchronous/ AV  pacing  QRSd 168    Telemetry    AV pacing at 60   STUDIES:  ECHO 11/2022 > LVEF 55-60%, no RWMA, G2DD, LA severely dilated, MV replacement 20 yr ago (unknown type/size), no evidence of MV stenosis > normal structure/ function of MV, TV regurgitation moderate, AV 23 mm Sapien prosthetic (TAVR)  ECHO 09/13/23 > LVEF 30-35%   DEVICE HISTORY:  MDT Dual Chamber PPM  CXR without pneumothorax  Device function normal   Hospital Course    [name] [name] is a 77 y.o. 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  CTA chest/abd/pelvis completed which showed an aortic dissection flap extending  from the isthmus of the aorta to the thoracic aorta through to the inferior margin of the hilum. Aneurysmal dilation of the ascending aorta measuring up to 4.2 cm. She was started on cardizem infusion and transferred to [facility].  General Cardiology saw patient and had PPM interrogated which had been at [facility] since 07/2022.   Assessment & Plan    Tachy-Brady Syndrome s/p MDT Dual PPM having presented with device at ERI x 1 year with ventricular asynchronous pacing  NICM, EF 30-35 % preceding VVI pacing  VHD  mechanical MV and TAVR  Ao Dissection Type B  INR 1.8<<1.2<<6.4  Ischemic heart disease with prior stenting    Patient's status post generator replacement.  Significant challenges in terms of deploying a resynchronize in lead with paucity of left ventricular veins and difficulty in torquing leads onto the septum.  For aortic dissection, "antiimpulse therapy "in her does not include beta-blockers, not willing to try them again.  With her depressed LV function would not use a calcium blocker and hence we use her Entresto and her spironolactone.  Continue furosemide 40.  Discontinue hydrochlorothiazide  Tylenol at discharge   Will need close followup for INR      [name] [name]  09/18/2023, 12:43  PM

## 2023-09-18 NOTE — Progress Notes (Signed)
PHARMACY - ANTICOAGULATION CONSULT NOTE  Pharmacy Consult for Warfarin Indication: atrial fibrillation, mechanical valve  Patient Measurements: Height: 4\' 11"  (149.9 cm) Weight: 57.2 kg (126 lb 3.2 oz) IBW/kg (Calculated) : 43.2 Heparin Dosing Weight: total weight  Vital Signs: Temp: 98.2 F (36.8 C) (11/30 0741) Temp Source: Oral (11/30 0741) BP: 139/71 (11/30 0741) Pulse Rate: 60 (11/30 0741)  Labs: Recent Labs    09/15/23 1127 09/15/23 2000 09/16/23 0252 09/17/23 0313 09/18/23 0244  HGB   < >  --  11.8* 11.1* 10.5*  HCT  --   --  36.8 34.7* 34.6*  PLT  --   --  129* 114* 96*  LABPROT  --   --  17.0* 20.9* 23.8*  INR  --   --  1.4* 1.8* 2.1*  HEPARINUNFRC  --  0.36 0.61 0.54  --   CREATININE  --   --  1.05* 1.01* 0.85   < > = values in this interval not displayed.    Estimated Creatinine Clearance: 43.4 mL/min (by C-G formula based on SCr of 0.85 mg/dL).  Assessment: 77 yo female on chronic warfarin for pAF, mechanical MVR.  Patient also with aortic dissection  - VVS following. PPM generator change and BiV upgrade completed on 11/29.   Home warfarin regimen: 7.5mg /day; pta INR goal 2.5 - 3.5.  INR 2.1 today, subtherapeutic but trending towards goal - up from yesterdays INR of 1.8. The plan is to continue warfarin without heparin bridge per EP. Will attempt to move back towards home regimen dosing. Hgb and platelets down slightly but expected given procedure yesterday.   Goal of Therapy:  INR goal 2.5 - 3.5 (confirmed with EP) Monitor platelets by anticoagulation protocol: Yes   Plan:  Give Warfarin 7.5 mg po x 1 Daily PT/INR/CBC  Thank you for allowing pharmacy to be a part of this patient's care.   Blane Ohara, PharmD, BCPS  PGY2 Pharmacy Resident

## 2023-09-18 NOTE — Progress Notes (Signed)
TOC meds and Rx for oxycodone given to patient.

## 2023-09-18 NOTE — Progress Notes (Addendum)
[facility] PROGRESS NOTE  HONEY DOROTHY ZOX:[id number] DOB: 02-28-1946 DOA: 09/12/2023 PCP: Marylen Ponto, MD  Status is: Inpatient   Barriers to discharge: Mechanical mitral valve.  Pending INR therapeutic between 2.5-3.5 Social:  Clinical:  Level of care: Progressive   Code Status: Full Family Communication: Patient DVT prophylaxis:  COVID vaccination status:   Subjective: Constipated for the last 6 days. Objective: Vitals:   09/18/23 0523 09/18/23 0741  BP:  139/71  Pulse:  60  Resp: 19 15  Temp:  98.2 F (36.8 C)  SpO2:  97%    Intake/Output Summary (Last 24 hours) at 09/18/2023 1033 Last data filed at 09/18/2023 0742 Gross per 24 hour  Intake 1086.33 ml  Output --  Net 1086.33 ml    Filed Weights   09/16/23 0559 09/17/23 0619 09/18/23 0521  Weight: 54.9 kg 54.2 kg 57.2 kg    Exam:  Constitutional: NAD, calm, comfortable Eyes: PERRL, lids and conjunctivae normal ENMT: Mucous membranes are moist. Posterior pharynx clear of any exudate or lesions.Normal dentition.  Neck: normal, supple, no masses, no thyromegaly Respiratory: clear to auscultation bilaterally, no wheezing, no crackles. Normal respiratory effort. No accessory muscle use.  Cardiovascular: Regular rate and rhythm, no murmurs / rubs / gallops. No extremity edema. 2+ pedal pulses. No carotid bruits.  Abdomen: no tenderness, no masses palpated. No hepatosplenomegaly. Bowel sounds positive.  Musculoskeletal: no clubbing / cyanosis. No joint deformity upper and lower extremities. Good ROM, no contractures. Normal muscle tone.  Skin: no rashes, lesions, ulcers. No induration Neurologic: CN 2-12 grossly intact. Sensation intact, DTR normal. Strength 5/5 x all 4 extremities.  Psychiatric: Normal judgment and insight. Alert and oriented x 3. Normal mood.    Assessment/Plan:  77 year old female with history of mechanical mitral bowel about 20 years ago, HFrEF, UT aneurysm, CKD stage 3,  hypothyroidism presents to the emergency department with complaints of  back pain,shortness of breath.  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  Patient was started on a esmolol, diltiazem drip.  CT surgery is consulted.    Aortic dissection - consulted vascular surgery -continued with Cardizem drip - close monitoring.   -plan for repeat CT angiogram -stable aortic dissection extending from the proximal ascending aorta to the mid proximal descending thoracic aorta -Recommended patient to follow-up as an outpatient in a month  Supratherapeutic INR -holding Coumadin - admitted with INR of 6.1. -Trended down to 1.3  Mechanical mitral valve -Currently on heparin drip  Chronic HFrEF - holding Lasix for now  Tachy-brady syndrome  -s/p pacemaker placement in February 2009 -interrogated  pacemaker -reached its end stage of the generator.  S/p generator change 11/29  Hypokalemia -Replaced through IV -Normalized   Diabetes mellitus with hyperglycemia -follow up on sliding scale insulin. -Get hemoglobin A1c-5.6  Hypothyroidism - check TSH-10.3 on 08/11/2022 - continue with Synthroid.    Paroxysmal atrial fibrillation -s/p ablation  Valvular heart disease -history of TAVR, MVR mechanical valve due to rheumatic heart disease -Continue the heparin drip -Start the patient on Coumadin, with a goal of INR between 2.5-3.5  Polysubstance abuse -urine drug screen was positive for marijuana, methadone, opiates -Patient states he took some marijuana gummy small size.   Data Reviewed: Basic Metabolic Panel: Recent Labs  Lab 09/12/23 0803 09/12/23 1421 09/13/23 0233 09/14/23 0127 09/15/23 0500 09/16/23 0252 09/17/23 0313 09/18/23 0244  NA 133*   < > 130* 131* 136 133* 135 136  K 2.7*   < > 3.5 4.3 3.4* 3.2* 3.2* 3.9  CL 96*   < > 99 97* 103 97* 100 109  CO2 25   < > 25 25 27 27 28 22   GLUCOSE 109*   <  > 101* 87 100* 110* 93 103*  BUN 11   < > 14 16 7* 11 11 11   CREATININE 1.21*   < > 0.92 1.00 0.89 1.05* 1.01* 0.85  CALCIUM 8.9   < > 8.2* 8.9 8.4* 8.7* 8.4* 8.1*  MG 2.3  --  2.5* 2.2  --   --   --   --   PHOS 2.1*  --  3.0  --   --   --   --   --    < > = values in this interval not displayed.   Liver Function Tests: Recent Labs  Lab 09/12/23 0803  AST 25  ALT 14  ALKPHOS 71  BILITOT 1.5*  PROT 6.9  ALBUMIN 3.9   No results for input(s): "LIPASE", "AMYLASE" in the last 168 hours. No results for input(s): "AMMONIA" in the last 168 hours. CBC: Recent Labs  Lab 09/14/23 0127 09/15/23 0500 09/16/23 0252 09/17/23 0313 09/18/23 0244  WBC 7.3 5.9 4.8 3.8* 4.8  HGB 12.3 11.5* 11.8* 11.1* 10.5*  HCT 38.9 36.1 36.8 34.7* 34.6*  MCV 90.0 91.4 89.8 90.8 92.8  PLT 183 141* 129* 114* 96*   Cardiac Enzymes: No results for input(s): "CKTOTAL", "CKMB", "CKMBINDEX", "TROPONINI" in the last 168 hours. BNP (last 3 results) Recent Labs    09/12/23 1200  BNP 419.8*    ProBNP (last 3 results) No results for input(s): "PROBNP" in the last 8760 hours.  CBG: Recent Labs  Lab 09/12/23 1623 09/12/23 1949 09/12/23 2342 09/13/23 0405 09/13/23 0823  GLUCAP 108* 110* 124* 92 158*    Recent Results (from the past 240 hour(s))  Surgical PCR screen     Status: None   Collection Time: 09/14/23  6:28 AM   Specimen: Nasal Mucosa; Nasal Swab  Result Value Ref Range Status   MRSA, PCR NEGATIVE NEGATIVE Final   Staphylococcus aureus NEGATIVE NEGATIVE Final    Comment: (NOTE) The Xpert SA Assay (FDA approved for NASAL specimens in patients 8 years of age and older), is one component of a comprehensive surveillance program. It is not intended to diagnose infection nor to guide or monitor treatment. Performed at [facility], [address], [place], [place] [address]   Aerobic/Anaerobic Culture w Gram Stain (surgical/deep wound)     Status: None (Preliminary result)    Collection Time: 09/17/23  6:03 PM   Specimen: PATH Cytology FNA; Tissue  Result Value Ref Range Status   Specimen Description CHEST  Final   Special Requests CYTO FNA  Final   Gram Stain   Final    RARE WBC SEEN NO ORGANISMS SEEN Performed at [facility], [address], [place], [place] [address]    Culture PENDING  Incomplete   Report Status PENDING  Incomplete     Studies: DG Chest 2 View  Result Date: 09/18/2023 CLINICAL DATA:  [id number].  Cardiac device in-situ. EXAM: CHEST - 2 VIEW COMPARISON:  Portable chest yesterday 6:59 p.m. FINDINGS: 6:38 a.m. Soft tissue density overlying the aortic knob consistent with known dissection. Stable mediastinum with tortuous and calcified aorta. Cardiac pacemaker and wire insertions are unchanged. There are sternotomy sutures and a TAVR and mitral ring prosthesis. The heart is  enlarged.  No vascular dilatation or edema are seen. Stable atelectasis at the lung bases is noted without focal infiltrates. Moderate to severe thoracic kyphosis with osteopenia. No new osseous findings. IMPRESSION: 1. No acute chest findings. 2. Cardiomegaly without evidence of vascular dilatation or edema. 3. Stable atelectasis at the lung bases. 4. Soft tissue density overlying the aortic knob consistent with known dissection. 5. Sternotomy sutures and cardiac valve prosthesis. 6. Heavily calcified aorta with uncoiling. Electronically Signed   By: Almira Bar M.D.   On: 09/18/2023 07:29   DG CHEST PORT 1 VIEW  Result Date: 09/17/2023 CLINICAL DATA:  Cardiac device in-situ. EXAM: PORTABLE CHEST 1 VIEW COMPARISON:  09/12/2023 FINDINGS: Cardiac pacemaker. Postoperative changes in the mediastinum. Shallow inspiration. Mild cardiac enlargement. No vascular congestion, edema, or consolidation. No pleural effusions. No pneumothorax. Mediastinal contours appear intact. Calcification of the aorta. Thickening of the shadow at the aortic knob is similar to prior study and likely  corresponds to known dissection. IMPRESSION: 1. Cardiac enlargement. 2. Calcification of the aorta with thickening of the aortic shadow corresponding to known dissection. 3. No airspace disease or consolidation in the lungs. Electronically Signed   By: Burman Nieves M.D.   On: 09/17/2023 19:26    Scheduled Meds:  ALPRAZolam  2 mg Oral BID   atorvastatin  40 mg Oral Once per day on Monday Thursday   furosemide  40 mg Oral Daily   levothyroxine  75 mcg Oral Q0600   pantoprazole  40 mg Oral Daily   sacubitril-valsartan  1 tablet Oral BID   sodium chloride flush  3 mL Intravenous Q12H   spironolactone  12.5 mg Oral Daily   warfarin  7.5 mg Oral ONCE-1600   Warfarin - Pharmacist Dosing Inpatient   Does not apply q1600   Continuous Infusions:   ceFAZolin (ANCEF) IV 1 g (09/18/23 1008)    Principal Problem:   Aortic dissection [facility]) Active Problems:   Pacemaker failure   Consultants:  Cardiology  Vascular surgery  EP   Time spent:  40 minutes    Valerio Pinard MD [facility] 7 am - 330 pm/M-F for direct patient care and secure chat Please refer to Amion for contact info6  days

## 2023-09-19 MED FILL — Lidocaine HCl Local Inj 1%: INTRAMUSCULAR | Qty: 60 | Status: AC

## 2023-09-19 MED FILL — Midazolam HCl Inj 2 MG/2ML (Base Equivalent): INTRAMUSCULAR | Qty: 2 | Status: AC

## 2023-09-19 MED FILL — Midazolam HCl Inj 5 MG/5ML (Base Equivalent): INTRAMUSCULAR | Qty: 1 | Status: AC

## 2023-09-20 ENCOUNTER — Other Ambulatory Visit (HOSPITAL_COMMUNITY): Payer: Self-pay

## 2023-09-20 ENCOUNTER — Encounter (HOSPITAL_COMMUNITY): Payer: Self-pay | Admitting: Internal Medicine

## 2023-09-20 NOTE — Discharge Summary (Signed)
Physician Discharge Summary   Patient: [name] [name] MRN: [id number] DOB: 1946-04-21  Admit date:     09/12/2023  Discharge date: 09/18/2023  Discharge Physician: [id number]   PCP: Marylen Ponto, MD   Recommendations at discharge:     Follow-up with primary care physician in 1 week Follow-up with the EP no 1-2 weeks Follow-up with CT surgery in 1 month  Discharge Diagnoses: Principal Problem:   Aortic dissection (HCC) Active Problems:   Pacemaker failure  Resolved Problems:   * No resolved hospital problems. *  Hospital Course: 77 year old female with history of mechanical mitral bowel about 20 years ago, HFrEF, UT aneurysm, CKD stage 3, hypothyroidism presents to the emergency department with complaints of  back pain,shortness of breath.  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  Patient was started on a esmolol, diltiazem drip.  CT surgery is consulted.     Aortic dissection - consulted vascular surgery -continued with Cardizem drip - close monitoring.   -plan for repeat CT angiogram -stable aortic dissection extending from the proximal ascending aorta to the mid proximal descending thoracic aorta -Recommended patient to follow-up as an outpatient in a month   Supratherapeutic INR -holding Coumadin - admitted with INR of 6.1. -Trended down to 1.3   Mechanical mitral valve -Currently on heparin drip   Chronic HFrEF - holding Lasix for now   Tachy-brady syndrome  -s/p pacemaker placement in February 2009 -interrogated  pacemaker -reached its end stage of the generator.  S/p generator change 11/29   Hypokalemia -Replaced through IV -Normalized    Diabetes mellitus with hyperglycemia -follow up on sliding scale insulin. -Get hemoglobin A1c-5.6   Hypothyroidism - check TSH-10.3 on 08/11/2022 - continue with Synthroid.     Paroxysmal atrial fibrillation -s/p ablation    Valvular heart disease -history of TAVR, MVR mechanical valve due to rheumatic heart disease -Continue the heparin drip -Start the patient on Coumadin, with a goal of INR between 2.5-3.5   Polysubstance abuse -urine drug screen was positive for marijuana, methadone, opiates -Patient states he took some marijuana gummy small size.        Consultants:  Cardiology, EP, vascular surgery Procedures performed:  pacemaker generator change  Disposition: Home health Diet recommendation:  Discharge Diet Orders (From admission, onward)     Start     Ordered   09/18/23 0000  Diet - low sodium heart healthy        09/18/23 1418           Cardiac diet DISCHARGE MEDICATION: Allergies as of 09/18/2023       Reactions   Atenolol Other (See Comments)   HR and pulse dropped   Lisinopril Other (See Comments)   Cluster headache x 1 month   Metoprolol Succinate [metoprolol] Other (See Comments)   HR and pulse dropped   Baclofen Anxiety, Other (See Comments)   Makes the patient feel anxious the day after taking this. "Makes me jerk"    Latex Rash        Medication List     STOP taking these medications    diltiazem 240 MG 24 hr capsule Commonly known as: DILACOR XR   hydrochlorothiazide 25 MG tablet Commonly known as: HYDRODIURIL   Klor-Con M20 20 MEQ tablet Generic drug: potassium chloride SA   zolpidem 10 MG tablet Commonly known as: AMBIEN       TAKE these medications    acetaminophen 500  MG tablet Commonly known as: TYLENOL Take 500 mg by mouth every 6 (six) hours as needed for mild pain (pain score 1-3).   albuterol 108 (90 Base) MCG/ACT inhaler Commonly known as: VENTOLIN HFA Inhale 2 puffs into the lungs as needed for shortness of breath.   alprazolam 2 MG tablet Commonly known as: XANAX Take 0.5 tablets (1 mg total) by mouth 2 (two) times daily as needed for anxiety.   atorvastatin 40 MG tablet Commonly known as: LIPITOR Take 1 tablet (40 mg total)  by mouth 2 (two) times a week.   cyclobenzaprine 10 MG tablet Commonly known as: FLEXERIL Take 10 mg by mouth daily as needed for muscle spasms.   docusate sodium 100 MG capsule Commonly known as: COLACE Take 1 capsule (100 mg total) by mouth 2 (two) times daily. What changed:  when to take this reasons to take this   Entresto 24-26 MG Generic drug: sacubitril-valsartan Take 1 tablet by mouth 2 (two) times daily.   furosemide 40 MG tablet Commonly known as: LASIX Take 20 mg by mouth 2 (two) times daily.   levothyroxine 75 MCG tablet Commonly known as: SYNTHROID Take 75 mcg by mouth daily before breakfast.   Menthol (Topical Analgesic) 2 % Gel Apply 1 Application topically as needed (pain).   nitroGLYCERIN 0.4 MG SL tablet Commonly known as: NITROSTAT Place 1 tablet (0.4 mg total) under the tongue every 5 (five) minutes as needed for chest pain.   oxyCODONE 5 MG immediate release tablet Commonly known as: Oxy IR/ROXICODONE Take 1 tablet (5 mg total) by mouth every 6 (six) hours as needed for up to 3 days for severe pain (pain score 7-10).   pantoprazole 40 MG tablet Commonly known as: PROTONIX TAKE 1 TABLET BY MOUTH EVERY DAY   spironolactone 25 MG tablet Commonly known as: ALDACTONE Take 0.5 tablets (12.5 mg total) by mouth daily.   warfarin 5 MG tablet Commonly known as: COUMADIN Take 7.5 mg by mouth every evening.        Follow-up Information     [facility] Follow up in 1 month(s).   Specialty: Vascular Surgery Why: sent Contact information: [address] [place] [address] [phone]        [facility]. Go in 9 day(s).   Specialty: Cardiology Why: Hospital follow up 10/01/2023 @ 3 pm PLEASE bring a c urrent medication list to appointment FREE valet parking, Entrance C, off [employer] information: [address] [place] [address] [phone]               Discharge Exam: Ceasar Mons Weights   09/16/23 0559 09/17/23 0619 09/18/23 0521  Weight: 54.9 kg 54.2 kg 57.2 kg   Constitutional: NAD, calm, comfortable Eyes: PERRL, lids and conjunctivae normal ENMT: Mucous membranes are moist. Posterior pharynx clear of any exudate or lesions.Normal dentition.  Neck: normal, supple, no masses, no thyromegaly Respiratory: clear to auscultation bilaterally, no wheezing, no crackles. Normal respiratory effort. No accessory muscle use.  Cardiovascular: Regular rate and rhythm, no murmurs / rubs / gallops. No extremity edema. 2+ pedal pulses. No carotid bruits.  Abdomen: no tenderness, no masses palpated. No hepatosplenomegaly. Bowel sounds positive.  Musculoskeletal: no clubbing / cyanosis. No joint deformity upper and lower extremities. Good ROM, no contractures. Normal muscle tone.  Skin: no rashes, lesions, ulcers. No induration Neurologic: CN 2-12 grossly intact. Sensation intact, DTR normal. Strength  5/5 x all 4 extremities.  Psychiatric: Normal judgment and insight. Alert and oriented x 3. Normal mood.   Condition at discharge: stable  The results of significant diagnostics from this hospitalization (including imaging, microbiology, ancillary and laboratory) are listed below for reference.   Imaging Studies: DG Chest 2 View  Result Date: 09/18/2023 CLINICAL DATA:  [id number].  Cardiac device in-situ. EXAM: CHEST - 2 VIEW COMPARISON:  Portable chest yesterday 6:59 p.m. FINDINGS: 6:38 a.m. Soft tissue density overlying the aortic knob consistent with known dissection. Stable mediastinum with tortuous and calcified aorta. Cardiac pacemaker and wire insertions are unchanged. There are sternotomy sutures and a TAVR and mitral ring prosthesis. The heart is enlarged.  No vascular dilatation or edema are seen. Stable atelectasis at the lung bases is noted without focal infiltrates. Moderate to  severe thoracic kyphosis with osteopenia. No new osseous findings. IMPRESSION: 1. No acute chest findings. 2. Cardiomegaly without evidence of vascular dilatation or edema. 3. Stable atelectasis at the lung bases. 4. Soft tissue density overlying the aortic knob consistent with known dissection. 5. Sternotomy sutures and cardiac valve prosthesis. 6. Heavily calcified aorta with uncoiling. Electronically Signed   By: Almira Bar M.D.   On: 09/18/2023 07:29   DG CHEST PORT 1 VIEW  Result Date: 09/17/2023 CLINICAL DATA:  Cardiac device in-situ. EXAM: PORTABLE CHEST 1 VIEW COMPARISON:  09/12/2023 FINDINGS: Cardiac pacemaker. Postoperative changes in the mediastinum. Shallow inspiration. Mild cardiac enlargement. No vascular congestion, edema, or consolidation. No pleural effusions. No pneumothorax. Mediastinal contours appear intact. Calcification of the aorta. Thickening of the shadow at the aortic knob is similar to prior study and likely corresponds to known dissection. IMPRESSION: 1. Cardiac enlargement. 2. Calcification of the aorta with thickening of the aortic shadow corresponding to known dissection. 3. No airspace disease or consolidation in the lungs. Electronically Signed   By: Burman Nieves M.D.   On: 09/17/2023 19:26   CT Angio Chest/Abd/Pel for Dissection W and/or W/WO  Result Date: 09/13/2023 CLINICAL DATA:  Dissection follow-up EXAM: CT ANGIOGRAPHY CHEST, ABDOMEN AND PELVIS TECHNIQUE: Noncontrast chest CT was performed. Multidetector CT imaging through the chest, abdomen and pelvis was performed using the standard protocol during bolus administration of intravenous contrast. Multiplanar reconstructed images and MIPs were obtained and reviewed to evaluate the vascular anatomy. RADIATION DOSE REDUCTION: This exam was performed according to the departmental dose-optimization program which includes automated exposure control, adjustment of the mA and/or kV according to patient size and/or use  of iterative reconstruction technique. CONTRAST:  75mL OMNIPAQUE IOHEXOL 350 MG/ML SOLN COMPARISON:  CTA chest abdomen and pelvis 10/02/2021. CT angiogram chest 09/11/2023. FINDINGS: CTA CHEST FINDINGS Cardiovascular: Again seen is aortic dissection which begins just distal to the takeoff of the left subclavian artery extending to the level of the mid/proximal descending thoracicaorta, unchanged from prior. There is a small amount of contrast filling the false lumen. The descending thoracic aorta is dilated measuring up to 4.3 x 3.8 cm, unchanged from prior examination. The distal descending aorta is nondilated without dissection measuring up to 2.7 cm. The ascending aorta is dilated measuring 4.2 x 4.0 cm, unchanged. There are atherosclerotic calcifications of the aorta. Origin of the great vessels appear patent. The heart is enlarged. Pacemaker is present. There is no pericardial effusion. The main pulmonary artery is enlarged compatible with pulmonary artery hypertension. There is no central pulmonary embolism. Mediastinum/Nodes: No enlarged mediastinal, hilar, or axillary lymph nodes. Thyroid gland, trachea, and esophagus demonstrate no significant findings. There is  no mediastinal fluid collection or pneumomediastinum. Lungs/Pleura: There are minimal patchy ground-glass opacities throughout the lungs bilaterally similar to prior. There is no pleural effusion or pneumothorax. There is bibasilar atelectasis. Musculoskeletal: Sternotomy wires are present. Mild compression deformity of T10 is unchanged. Review of the MIP images confirms the above findings. CTA ABDOMEN AND PELVIS FINDINGS VASCULAR Aorta: Normal caliber aorta without aneurysm, dissection, vasculitis or significant stenosis. There is atherosclerotic calcifications throughout the aorta. Celiac: Patent without evidence of aneurysm, dissection, vasculitis or significant stenosis. SMA: Patent without evidence of aneurysm, dissection, vasculitis or  significant stenosis. Renals: Both renal arteries are patent without evidence of aneurysm, dissection, vasculitis, fibromuscular dysplasia or significant stenosis. IMA: Patent without evidence of aneurysm, dissection, vasculitis or significant stenosis. Inflow: Patent without evidence of aneurysm, dissection, vasculitis or significant stenosis. Veins: No obvious venous abnormality within the limitations of this arterial phase study. Review of the MIP images confirms the above findings. NON-VASCULAR Hepatobiliary: No focal liver abnormality is seen. Status post cholecystectomy. No biliary dilatation. Pancreas: Unremarkable. No pancreatic ductal dilatation or surrounding inflammatory changes. Spleen: Normal in size without focal abnormality. Splenule is again seen. Adrenals/Urinary Tract: Adrenal glands are unremarkable. Kidneys are normal, without renal calculi, focal lesion, or hydronephrosis. Bladder is unremarkable. Stomach/Bowel: Stomach is within normal limits. No evidence of bowel wall thickening, distention, or inflammatory changes. The appendix is not seen. Lymphatic: No enlarged lymph nodes are identified. Reproductive: Uterus and adnexa are within normal limits. Other: No abdominal wall hernia or abnormality. No abdominopelvic ascites. Musculoskeletal: Mild compression deformity of L1 again noted. The bones are diffusely osteopenic. Right hip arthroplasty present. Review of the MIP images confirms the above findings. IMPRESSION: 1. Stable aortic dissection extending from the proximal ascending aorta to the mid/proximal descending thoracic aorta. No change in aortic aneurysm measuring up to 4.3 cm. 2. Stable cardiomegaly. 3. Stable enlargement of the main pulmonary artery compatible with pulmonary artery hypertension. 4. Stable minimal patchy ground-glass opacities throughout the lungs bilaterally. Findings may be related to edema or infection. 5. No acute localizing process in the abdomen or pelvis. 6.  Aortic atherosclerosis. Aortic Atherosclerosis (ICD10-I70.0). Electronically Signed   By: Darliss Cheney M.D.   On: 09/13/2023 20:37   ECHOCARDIOGRAM COMPLETE  Result Date: 09/13/2023    ECHOCARDIOGRAM REPORT   Patient Name:   [name] [name] Date of Exam: 09/13/2023 Medical Rec #:  [id number]          Height:       58.0 in Accession #:    [id number]         Weight:       126.1 lb Date of Birth:  Sep 18, 1946          BSA:          1.497 m Patient Age:    77 years           BP:           199/82 mmHg Patient Gender: F                  HR:           65 bpm. Exam Location:  Inpatient Procedure: 2D Echo, Cardiac Doppler, Color Doppler and Intracardiac            Opacification Agent Indications:    Dyspnea R06.00  History:        Patient has prior history of Echocardiogram examinations, most  recent 11/23/2022. CHF, Previous Myocardial Infarction, Pacemaker,                 Aortic Valve Disease and Mitral Valve Disease; Risk                 Factors:Hypertension.  Sonographer:    Harriette Bouillon RDCS Referring Phys: [phone] HAO MENG IMPRESSIONS  1. Left ventricular ejection fraction, by estimation, is 30 to 35%. The left ventricle has moderately decreased function. The left ventricle demonstrates global hypokinesis. There is mild concentric left ventricular hypertrophy. Left ventricular diastolic parameters are indeterminate.  2. Right ventricular systolic function is mildly reduced. The right ventricular size is normal. There is mildly elevated pulmonary artery systolic pressure.  3. Left atrial size was severely dilated.  4. Right atrial size was moderately dilated.  5. MV peak gradient with MVA by P1/2t 4.2cm2. The mitral valve has been repaired/replaced. No evidence of mitral valve regurgitation. No evidence of mitral stenosis.  6. Tricuspid valve regurgitation is moderate.  7. The aortic valve has been repaired/replaced. Aortic valve regurgitation is not visualized. No aortic stenosis is present.  Echo findings are consistent with normal structure and function of the aortic valve prosthesis. Aortic valve area, by VTI measures 2.88 cm. Aortic valve mean gradient measures 4.0 mmHg. Aortic valve Vmax measures 1.42 m/s.  8. The inferior vena cava is normal in size with greater than 50% respiratory variability, suggesting right atrial pressure of 3 mmHg. FINDINGS  Left Ventricle: Left ventricular ejection fraction, by estimation, is 30 to 35%. The left ventricle has moderately decreased function. The left ventricle demonstrates global hypokinesis. The left ventricular internal cavity size was normal in size. There is mild concentric left ventricular hypertrophy. Abnormal (paradoxical) septal motion, consistent with RV pacemaker. Left ventricular diastolic parameters are indeterminate. Right Ventricle: The right ventricular size is normal. No increase in right ventricular wall thickness. Right ventricular systolic function is mildly reduced. There is mildly elevated pulmonary artery systolic pressure. The tricuspid regurgitant velocity  is 2.91 m/s, and with an assumed right atrial pressure of 3 mmHg, the estimated right ventricular systolic pressure is 36.9 mmHg. Left Atrium: Left atrial size was severely dilated. Right Atrium: Right atrial size was moderately dilated. Pericardium: There is no evidence of pericardial effusion. Mitral Valve: MV peak gradient with MVA by P1/2t 4.2cm2. The mitral valve has been repaired/replaced. No evidence of mitral valve regurgitation. There is a mechanical valve present in the mitral position. No evidence of mitral valve stenosis. Tricuspid Valve: The tricuspid valve is normal in structure. Tricuspid valve regurgitation is moderate . No evidence of tricuspid stenosis. Aortic Valve: The aortic valve has been repaired/replaced. Aortic valve regurgitation is not visualized. No aortic stenosis is present. Aortic valve mean gradient measures 4.0 mmHg. Aortic valve peak gradient  measures 8.0 mmHg. Aortic valve area, by VTI measures 2.88 cm. There is a 23 mm Ultra, stented (TAVR) valve present in the aortic position. Echo findings are consistent with normal structure and function of the aortic valve prosthesis. Pulmonic Valve: The pulmonic valve was normal in structure. Pulmonic valve regurgitation is trivial. No evidence of pulmonic stenosis. Aorta: The aortic root is normal in size and structure. Venous: The inferior vena cava is normal in size with greater than 50% respiratory variability, suggesting right atrial pressure of 3 mmHg. IAS/Shunts: No atrial level shunt detected by color flow Doppler. Additional Comments: A device lead is visualized.  LEFT VENTRICLE PLAX 2D LVIDd:  4.70 cm   Diastology LVIDs:         3.80 cm   LV e' medial:    4.45 cm/s LV PW:         1.20 cm   LV E/e' medial:  28.3 LV IVS:        1.20 cm   LV e' lateral:   6.75 cm/s LVOT diam:     2.00 cm   LV E/e' lateral: 18.7 LV SV:         70 LV SV Index:   47 LVOT Area:     3.14 cm  RIGHT VENTRICLE         IVC TAPSE (M-mode): 1.1 cm  IVC diam: 1.50 cm LEFT ATRIUM            Index        RIGHT ATRIUM           Index LA diam:      4.60 cm  3.07 cm/m   RA Area:     22.60 cm LA Vol (A4C): 106.0 ml 70.81 ml/m  RA Volume:   74.70 ml  49.90 ml/m  AORTIC VALVE AV Area (Vmax):    2.73 cm AV Area (Vmean):   3.06 cm AV Area (VTI):     2.88 cm AV Vmax:           141.50 cm/s AV Vmean:          92.800 cm/s AV VTI:            0.242 m AV Peak Grad:      8.0 mmHg AV Mean Grad:      4.0 mmHg LVOT Vmax:         123.00 cm/s LVOT Vmean:        90.300 cm/s LVOT VTI:          0.222 m LVOT/AV VTI ratio: 0.92  AORTA Ao Root diam: 3.50 cm MITRAL VALVE                TRICUSPID VALVE MV Area (PHT): 4.24 cm     TR Peak grad:   33.9 mmHg MV Decel Time: 179 msec     TR Vmax:        291.00 cm/s MV E velocity: 126.00 cm/s                             SHUNTS                             Systemic VTI:  0.22 m                              Systemic Diam: 2.00 cm Arvilla Meres MD Electronically signed by Arvilla Meres MD Signature Date/Time: 09/13/2023/10:20:14 AM    Final    DG Chest Port 1 View  Result Date: 09/12/2023 CLINICAL DATA:  Dyspnea.  History of aortic dissection. EXAM: PORTABLE CHEST 1 VIEW COMPARISON:  08/11/2022 FINDINGS: There is a left chest wall pacer device with leads in the right atrial appendage and right ventricle. Previous median sternotomy and mitral valve replacement. Status post TAVR. Aortic atherosclerosis and cardiac enlargement. Lungs are hyperinflated. No pleural fluid, interstitial edema or airspace disease. Visualized osseous structures appear unremarkable. IMPRESSION: 1. No acute findings. 2. Cardiomegaly. 3. Hyperinflation. Electronically Signed   By: Ladona Ridgel  Bradly Chris M.D.   On: 09/12/2023 08:19    Microbiology: Results for orders placed or performed during the hospital encounter of 09/12/23  Surgical PCR screen     Status: None   Collection Time: 09/14/23  6:28 AM   Specimen: Nasal Mucosa; Nasal Swab  Result Value Ref Range Status   MRSA, PCR NEGATIVE NEGATIVE Final   Staphylococcus aureus NEGATIVE NEGATIVE Final    Comment: (NOTE) The Xpert SA Assay (FDA approved for NASAL specimens in patients 49 years of age and older), is one component of a comprehensive surveillance program. It is not intended to diagnose infection nor to guide or monitor treatment. Performed at [facility], [address] [place], [place] [address]   Aerobic/Anaerobic Culture w Gram Stain (surgical/deep wound)     Status: None (Preliminary result)   Collection Time: 09/17/23  6:03 PM   Specimen: PATH Cytology FNA; Tissue  Result Value Ref Range Status   Specimen Description CHEST  Final   Special Requests CYTO FNA  Final   Gram Stain RARE WBC SEEN NO ORGANISMS SEEN   Final   Culture   Final    NO GROWTH 3 DAYS NO ANAEROBES ISOLATED; CULTURE IN PROGRESS FOR 5 DAYS Performed at [facility],  [address], [place], [place] [address]    Report Status PENDING  Incomplete    Labs: CBC: Recent Labs  Lab 09/14/23 0127 09/15/23 0500 09/16/23 0252 09/17/23 0313 09/18/23 0244  WBC 7.3 5.9 4.8 3.8* 4.8  HGB 12.3 11.5* 11.8* 11.1* 10.5*  HCT 38.9 36.1 36.8 34.7* 34.6*  MCV 90.0 91.4 89.8 90.8 92.8  PLT 183 141* 129* 114* 96*   Basic Metabolic Panel: Recent Labs  Lab 09/14/23 0127 09/15/23 0500 09/16/23 0252 09/17/23 0313 09/18/23 0244  NA 131* 136 133* 135 136  K 4.3 3.4* 3.2* 3.2* 3.9  CL 97* 103 97* 100 109  CO2 25 27 27 28 22   GLUCOSE 87 100* 110* 93 103*  BUN 16 7* 11 11 11   CREATININE 1.00 0.89 1.05* 1.01* 0.85  CALCIUM 8.9 8.4* 8.7* 8.4* 8.1*  MG 2.2  --   --   --   --    Liver Function Tests: No results for input(s): "AST", "ALT", "ALKPHOS", "BILITOT", "PROT", "ALBUMIN" in the last 168 hours. CBG: No results for input(s): "GLUCAP" in the last 168 hours.  Discharge time spent: greater than 30 minutes.  SignedSusa Griffins, MD [facility] 09/20/2023

## 2023-09-20 NOTE — Telephone Encounter (Signed)
Called patient's daughter, Jeraldine Loots and she reported that the patient has dementia and was confused about what the hospital was trying to do. Patient is at home now and is doing fine. [name] had no further questions at this time.

## 2023-09-22 LAB — AEROBIC/ANAEROBIC CULTURE W GRAM STAIN (SURGICAL/DEEP WOUND)

## 2023-09-23 ENCOUNTER — Other Ambulatory Visit: Payer: Self-pay

## 2023-09-23 DIAGNOSIS — I7103 Dissection of thoracoabdominal aorta: Secondary | ICD-10-CM

## 2023-09-29 NOTE — Patient Instructions (Incomplete)
   After Your Pacemaker   Monitor your pacemaker site for redness, swelling, and drainage. Call the device clinic at [phone] if you experience these symptoms or fever/chills.  Your incision was closed with Dermabond:  You may shower 1 day after your defibrillator implant and wash your incision with soap and water. Avoid lotions, ointments, or perfumes over your incision until it is well-healed.  You may use a hot tub or a pool after your wound check appointment if the incision is completely closed.  Do not lift, push or pull greater than 10 pounds with the affected arm until 6 weeks after your procedure. UNTIL AFTER JANUARY 10TH. There are no other restrictions in arm movement after your wound check appointment.  You may drive, unless driving has been restricted by your healthcare providers.  Remote monitoring is used to monitor your pacemaker from home. This monitoring is scheduled every 91 days by our office. It allows [place] to keep an eye on the functioning of your device to ensure it is working properly. You will routinely see your Electrophysiologist annually (more often if necessary).

## 2023-09-30 ENCOUNTER — Ambulatory Visit: Payer: Medicare Other | Attending: Family Medicine

## 2023-09-30 DIAGNOSIS — Z7901 Long term (current) use of anticoagulants: Secondary | ICD-10-CM | POA: Diagnosis not present

## 2023-09-30 DIAGNOSIS — Z952 Presence of prosthetic heart valve: Secondary | ICD-10-CM | POA: Diagnosis not present

## 2023-10-01 ENCOUNTER — Encounter (HOSPITAL_COMMUNITY): Payer: Medicare Other

## 2023-10-01 ENCOUNTER — Telehealth: Payer: Self-pay | Admitting: Internal Medicine

## 2023-10-01 NOTE — Telephone Encounter (Signed)
Patient is calling because she had a device implanted and stated it has been hurting and it is heavy. Patient is requesting to speak with nurse. Please advise.

## 2023-10-04 NOTE — Telephone Encounter (Signed)
Received call back from Pt.  She will be in town tomorrow with her brother who also has an appointment.    Pt rescheduled missed wound check for 10/05/2023 at 2:00 pm.  Directions given.

## 2023-10-04 NOTE — Telephone Encounter (Signed)
Left message requesting call back.  Pt missed wound check appointment scheduled last week.  Will need to be rescheduled.

## 2023-10-04 NOTE — Telephone Encounter (Signed)
Attempted outreach to Pt.  Call went to VM.  Outreach made to daughter per DPR.  She will call Pt and have her call device clinic to reschedule wound check.

## 2023-10-04 NOTE — Addendum Note (Signed)
Addended by: Leilani Able, Simuel Stebner A on: 10/04/2023 02:18 PM   Modules accepted: Orders

## 2023-10-05 ENCOUNTER — Ambulatory Visit: Payer: Medicare Other | Attending: Internal Medicine

## 2023-10-05 DIAGNOSIS — I4581 Long QT syndrome: Secondary | ICD-10-CM

## 2023-10-05 NOTE — Patient Instructions (Signed)
   After Your Pacemaker   Monitor your pacemaker site for redness, swelling, and drainage. Call the device clinic at [phone] if you experience these symptoms or fever/chills.  Your incision was closed with Dermabond:  You may shower 1 day after your defibrillator implant and wash your incision with soap and water. Avoid lotions, ointments, or perfumes over your incision until it is well-healed.  You may use a hot tub or a pool after your wound check appointment if the incision is completely closed.  Do not lift, push or pull greater than 10 pounds with the affected arm until 6 weeks after your procedure. There are no other restrictions in arm movement after your wound check appointment.  You may drive, unless driving has been restricted by your healthcare providers.  Remote monitoring is used to monitor your pacemaker from home. This monitoring is scheduled every 91 days by our office. It allows us to keep an eye on the functioning of your device to ensure it is working properly. You will routinely see your Electrophysiologist annually (more often if necessary).  

## 2023-10-06 DIAGNOSIS — R300 Dysuria: Secondary | ICD-10-CM | POA: Diagnosis not present

## 2023-10-07 ENCOUNTER — Other Ambulatory Visit: Payer: Medicare Other

## 2023-10-08 LAB — CUP PACEART INCLINIC DEVICE CHECK
Date Time Interrogation Session: 20241217110437
Implantable Lead Connection Status: 753985
Implantable Lead Connection Status: 753985
Implantable Lead Connection Status: 753985
Implantable Lead Implant Date: 20090226
Implantable Lead Implant Date: 20090226
Implantable Lead Implant Date: 20241129
Implantable Lead Location: 753859
Implantable Lead Location: 753860
Implantable Lead Location: 753860
Implantable Lead Model: 3830
Implantable Lead Model: 5076
Implantable Lead Model: 5076
Implantable Pulse Generator Implant Date: 20241129

## 2023-10-08 NOTE — Progress Notes (Signed)
Wound check appointment. Dermabond removed. Wound without redness or edema. Incision edges approximated, wound well healed. Normal device function. Thresholds, sensing, and impedances consistent with implant measurements. Device programmed at 3.5V/auto capture programmed on for extra safety margin until 3 month visit. Histogram distribution appropriate for patient and level of activity. No mode switches or high ventricular rates noted. Patient educated about wound care, arm mobility, lifting restrictions. ROV in 3 months with implanting physician.  Error in saving PDF. See scanned media for report.

## 2023-10-14 ENCOUNTER — Ambulatory Visit: Payer: Medicare Other | Admitting: Vascular Surgery

## 2023-10-14 DIAGNOSIS — M25551 Pain in right hip: Secondary | ICD-10-CM | POA: Diagnosis not present

## 2023-10-17 LAB — FUNGUS CULTURE WITH STAIN

## 2023-10-17 LAB — FUNGAL ORGANISM REFLEX

## 2023-10-17 LAB — FUNGUS CULTURE RESULT

## 2023-10-19 ENCOUNTER — Encounter (HOSPITAL_COMMUNITY): Payer: Medicare Other

## 2023-10-19 ENCOUNTER — Telehealth (HOSPITAL_COMMUNITY): Payer: Self-pay | Admitting: Cardiology

## 2023-10-19 NOTE — Telephone Encounter (Signed)
Pt would like a return call  for clarification regarding appt scheduled on 10/25/23. Please advise

## 2023-10-25 ENCOUNTER — Encounter (HOSPITAL_COMMUNITY): Payer: Medicare Other

## 2023-10-29 ENCOUNTER — Encounter (HOSPITAL_COMMUNITY): Payer: Medicare Other

## 2023-10-30 IMAGING — DX DG CHEST 1V PORT
1 series · 1 of 1 positions shown · non-contrast
Comparison: Chest radiograph dated 07/21/2021.

CLINICAL DATA: Shortness of breath.

EXAM:
PORTABLE CHEST 1 VIEW

[chest ap]
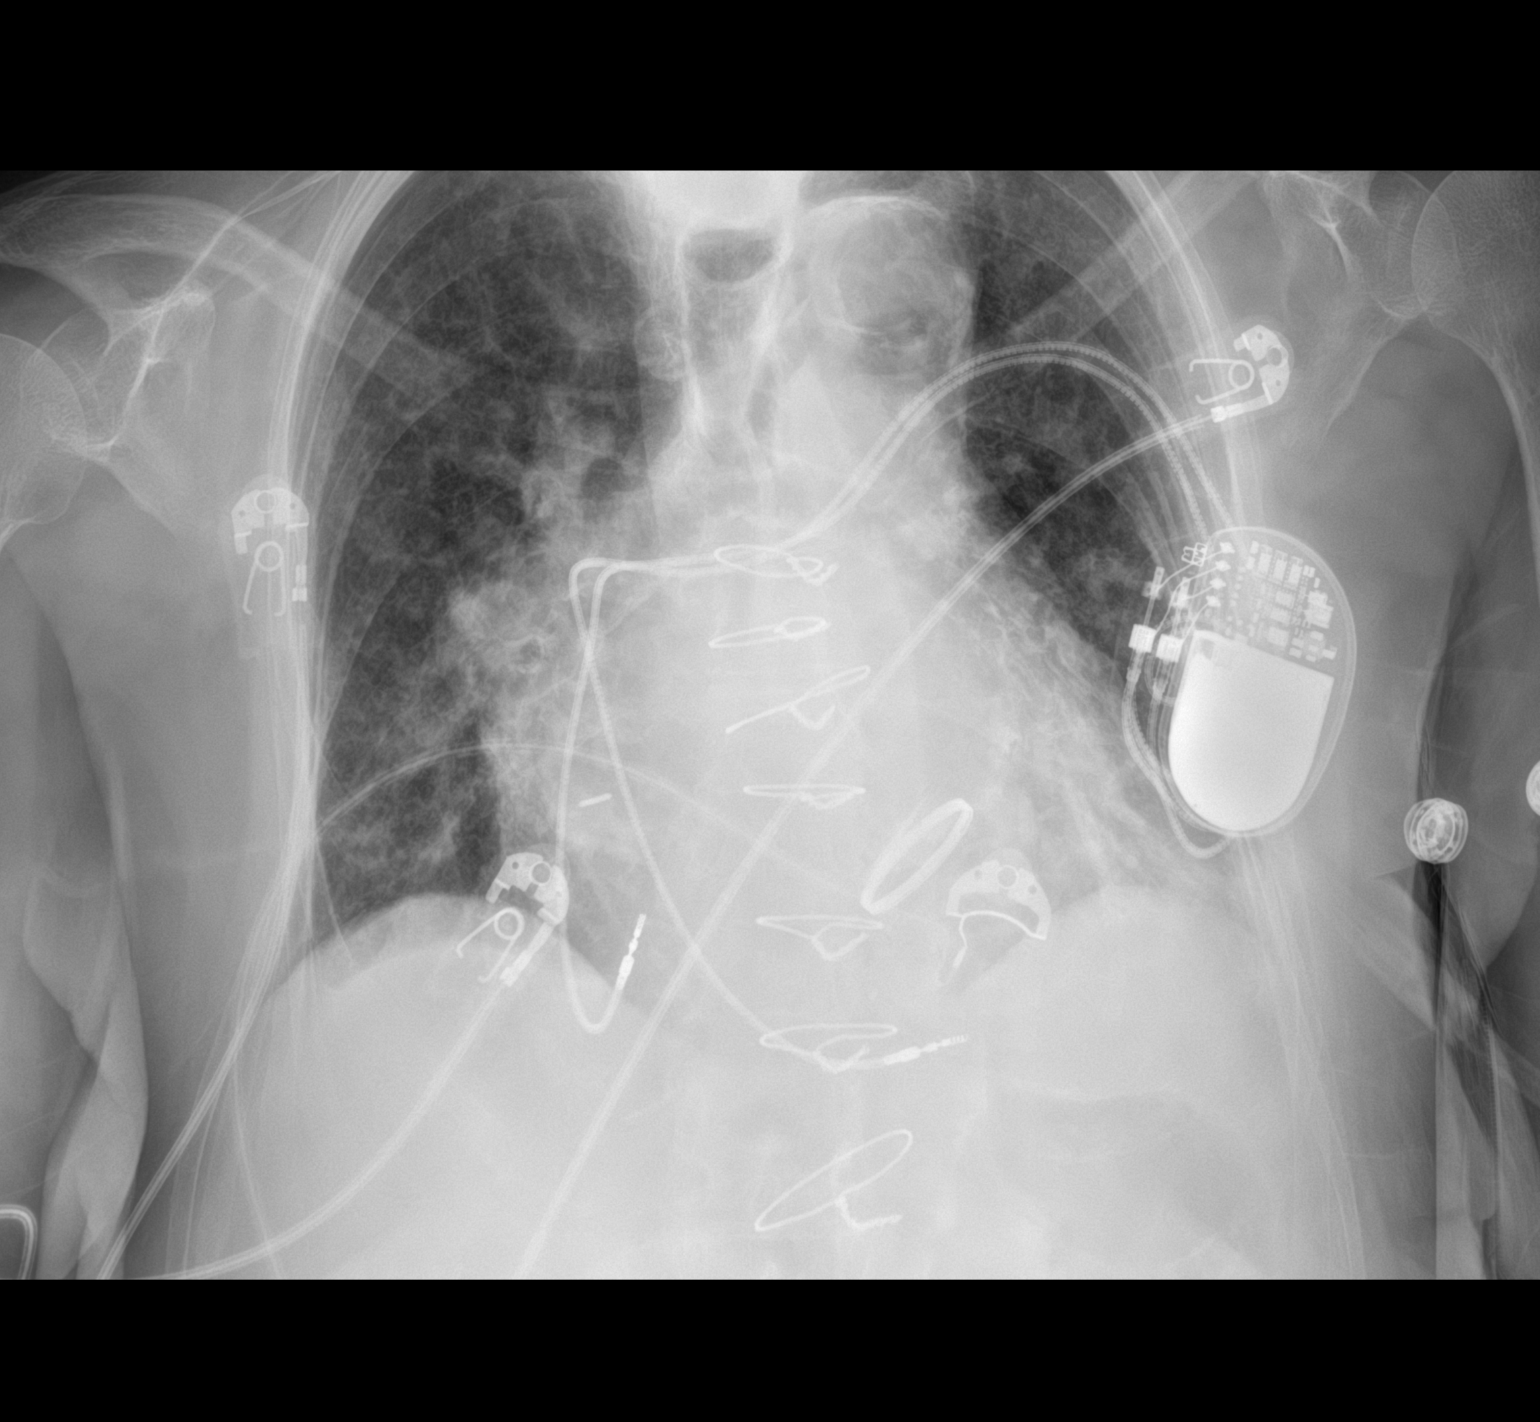

[1 of 1 positions shown; findings below may reference images not displayed]

FINDINGS: Cardiomegaly with vascular congestion. Bilateral mid to lower lung
field densities may represent atelectasis, or vascular congestion.
Developing infiltrate is not excluded clinical correlation
recommended no pneumothorax. Median sternotomy wires and mechanical
cardiac valve. Atherosclerotic calcification of the aorta. Left
pectoral pacemaker device. No acute osseous pathology.
IMPRESSION: Cardiomegaly with vascular congestion. Developing infiltrate is not
excluded.

## 2023-11-04 ENCOUNTER — Ambulatory Visit: Payer: Medicare Other | Admitting: Vascular Surgery

## 2023-11-07 DIAGNOSIS — I5020 Unspecified systolic (congestive) heart failure: Secondary | ICD-10-CM | POA: Diagnosis not present

## 2023-11-09 ENCOUNTER — Encounter (HOSPITAL_COMMUNITY): Payer: Medicare Other

## 2023-11-26 DIAGNOSIS — W19XXXA Unspecified fall, initial encounter: Secondary | ICD-10-CM | POA: Diagnosis not present

## 2023-11-26 DIAGNOSIS — S300XXA Contusion of lower back and pelvis, initial encounter: Secondary | ICD-10-CM | POA: Diagnosis not present

## 2023-11-26 DIAGNOSIS — S20212A Contusion of left front wall of thorax, initial encounter: Secondary | ICD-10-CM | POA: Diagnosis not present

## 2023-12-03 DIAGNOSIS — Z7901 Long term (current) use of anticoagulants: Secondary | ICD-10-CM | POA: Diagnosis not present

## 2023-12-03 DIAGNOSIS — Z952 Presence of prosthetic heart valve: Secondary | ICD-10-CM | POA: Diagnosis not present

## 2023-12-08 DIAGNOSIS — I5020 Unspecified systolic (congestive) heart failure: Secondary | ICD-10-CM | POA: Diagnosis not present

## 2023-12-10 ENCOUNTER — Ambulatory Visit: Payer: Medicare Other | Attending: Cardiology

## 2023-12-10 DIAGNOSIS — Z952 Presence of prosthetic heart valve: Secondary | ICD-10-CM

## 2023-12-10 DIAGNOSIS — I10 Essential (primary) hypertension: Secondary | ICD-10-CM

## 2023-12-10 DIAGNOSIS — I480 Paroxysmal atrial fibrillation: Secondary | ICD-10-CM

## 2023-12-10 DIAGNOSIS — Z7901 Long term (current) use of anticoagulants: Secondary | ICD-10-CM | POA: Diagnosis not present

## 2023-12-10 DIAGNOSIS — Z950 Presence of cardiac pacemaker: Secondary | ICD-10-CM | POA: Diagnosis not present

## 2023-12-10 LAB — ECHOCARDIOGRAM COMPLETE
AV Mean grad: 7 mm[Hg]
AV Peak grad: 11 mm[Hg]
Ao pk vel: 1.66 m/s
Area-P 1/2: 2.93 cm2
S' Lateral: 2.8 cm

## 2023-12-15 IMAGING — CR DG CHEST 2V
2 series · 2 of 2 positions shown · non-contrast
Comparison: 09/30/2021

CLINICAL DATA: Congestive heart failure

EXAM:
CHEST - 2 VIEW

[chest lat]
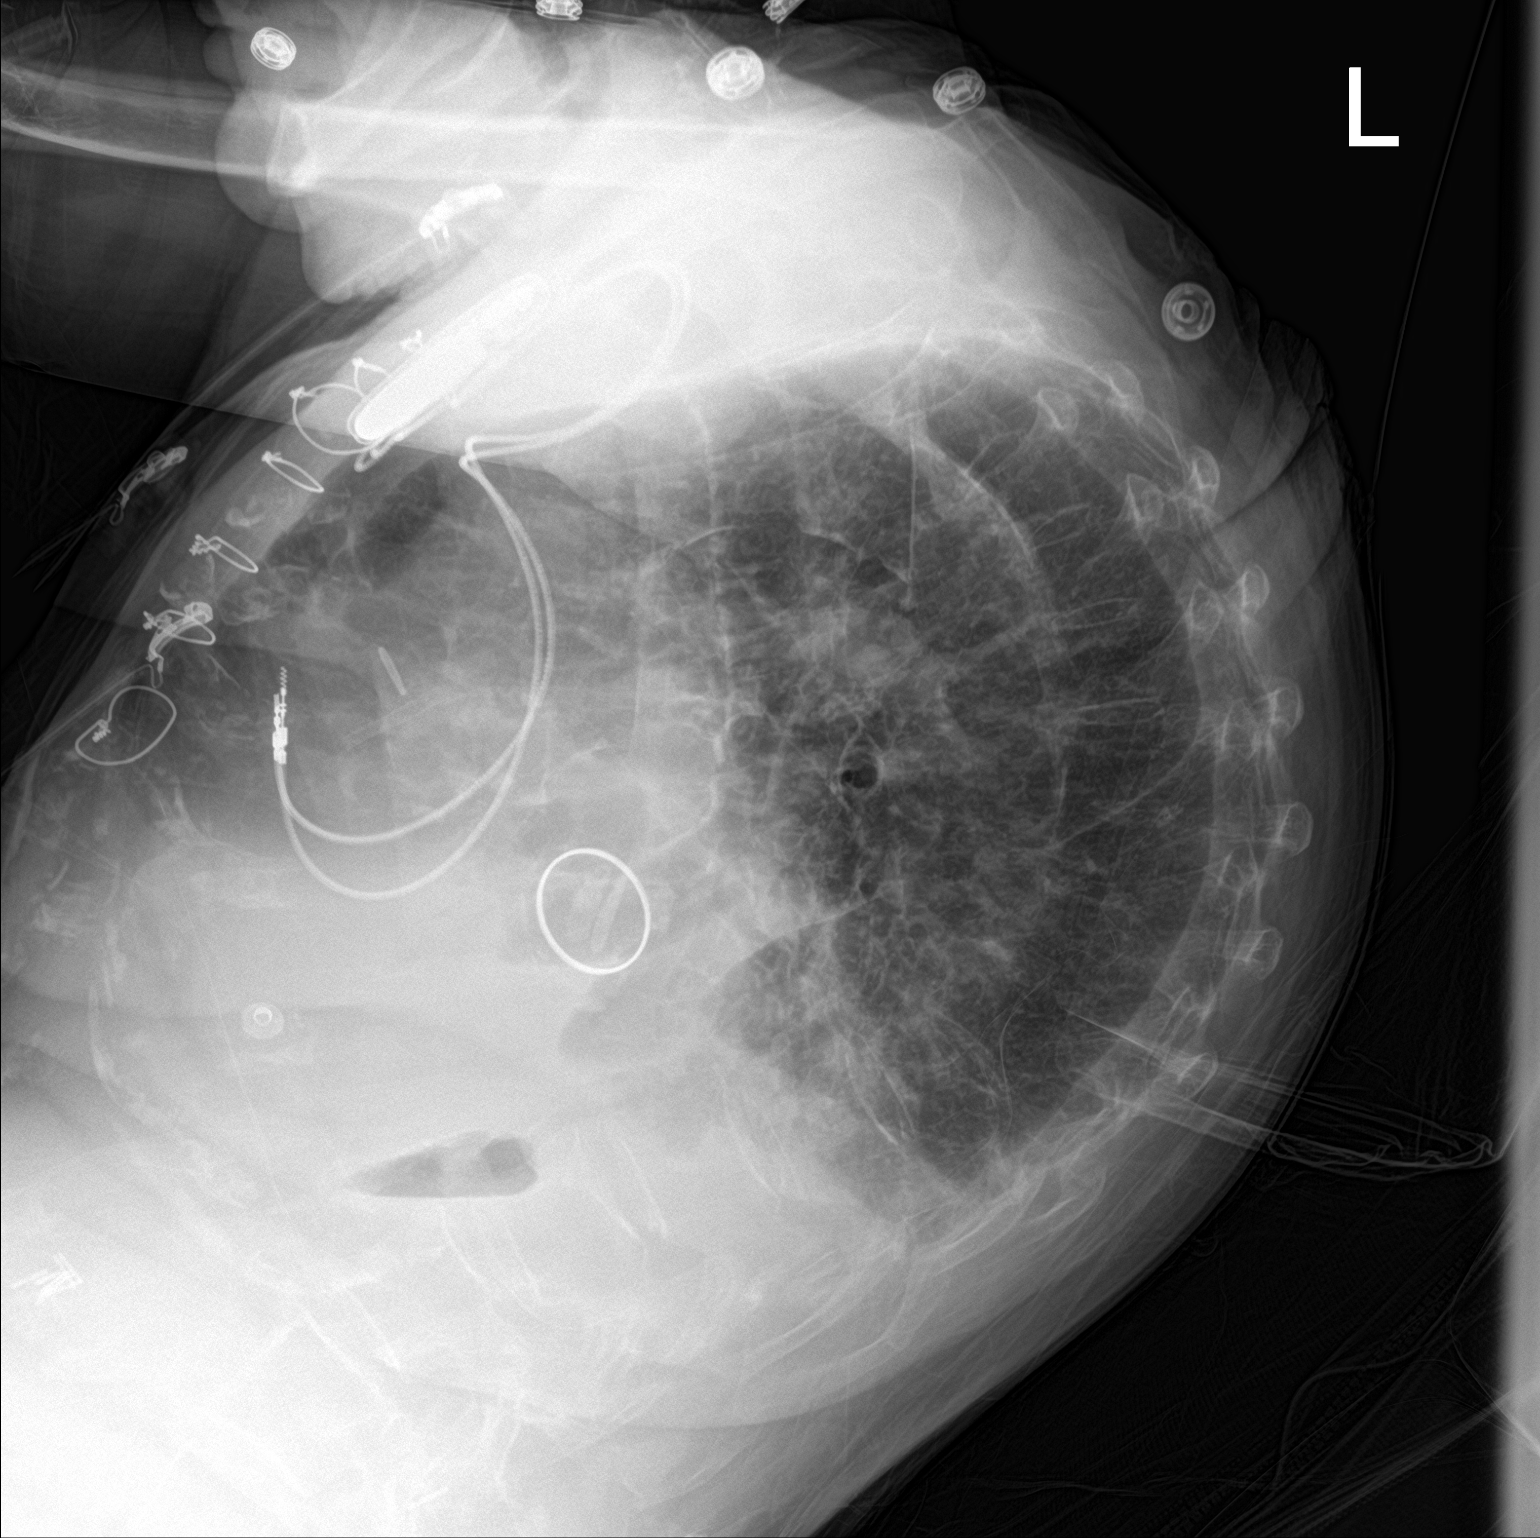

[chest ap]
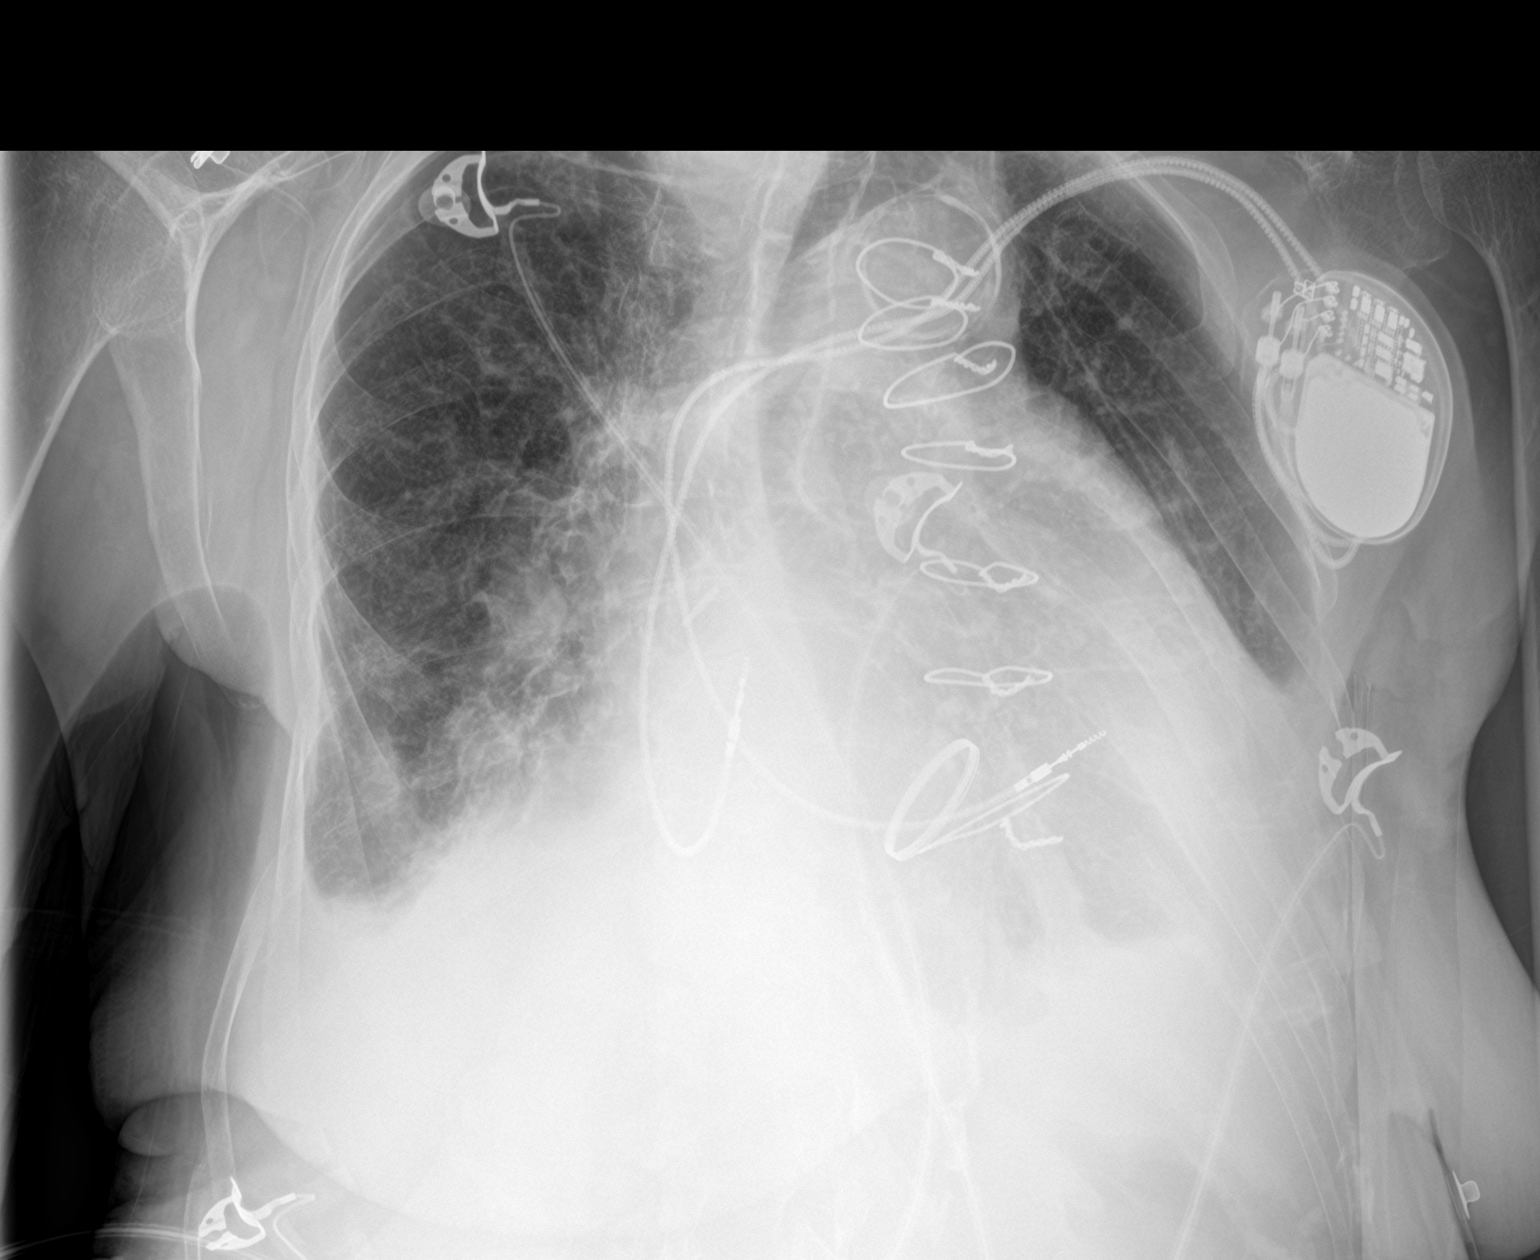

[2 of 2 positions shown; findings below may reference images not displayed]

FINDINGS: LEFT-sided pacemaker overlies normal cardiac silhouette. Patient
rotated leftward. Sternotomy wires noted. LEFT pacer noted.
Bilateral pleural effusions present. No pulmonary edema. No
infiltrate.
IMPRESSION: Cardiomegaly and bilateral pleural effusions

## 2023-12-15 IMAGING — US US ABDOMEN LIMITED
1 series · 14 of 25 positions shown · non-contrast
Comparison: 10/02/2021.

CLINICAL DATA: Transaminitis.

EXAM:
ULTRASOUND ABDOMEN LIMITED RIGHT UPPER QUADRANT

[Series 1: us abdomen limited ruq (liver/gb) · 14 of 33 slices shown]
[im 1/33]
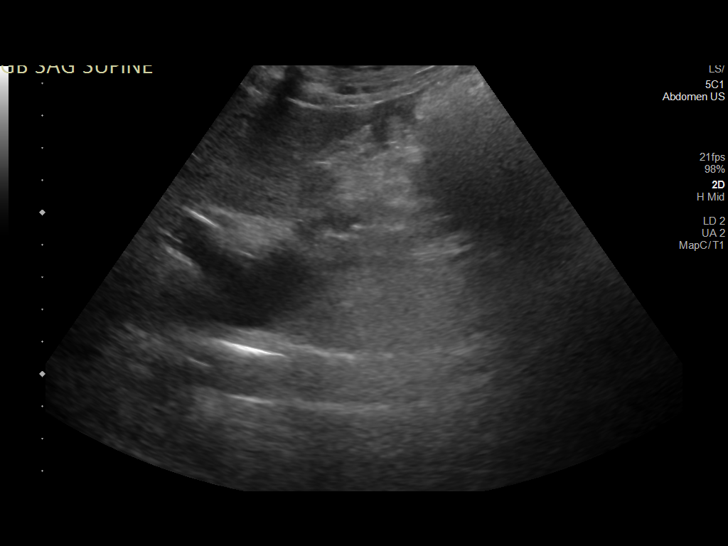
[im 3/33]
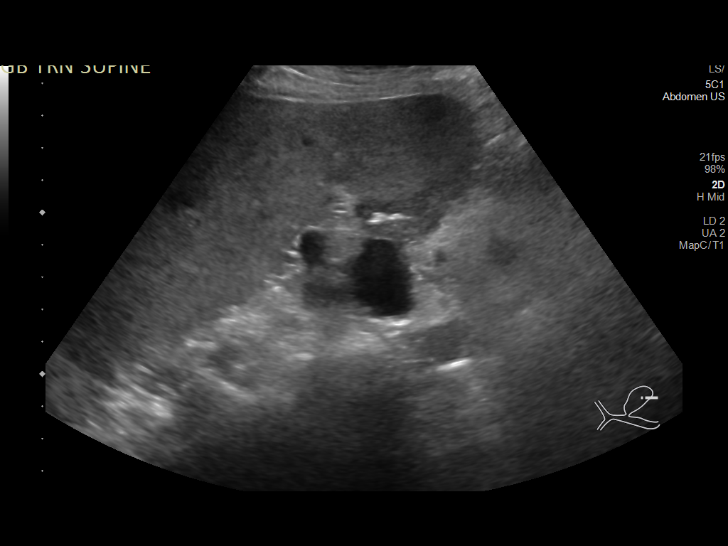
[im 6/33]
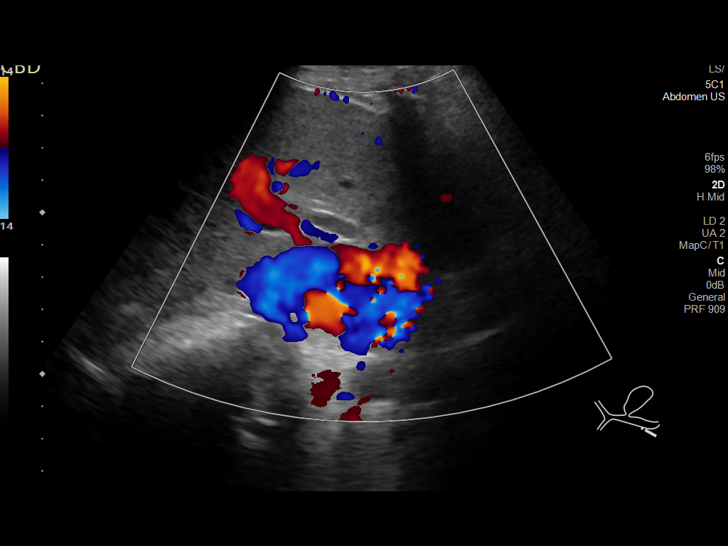
[im 9/33]
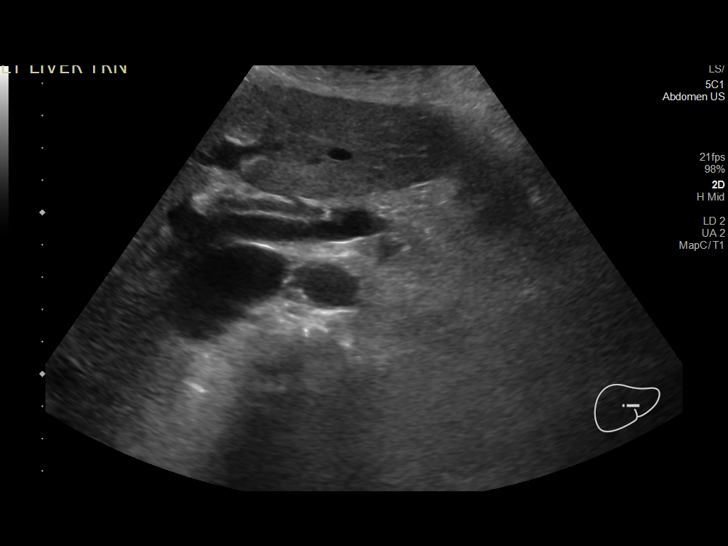
[im 11/33]
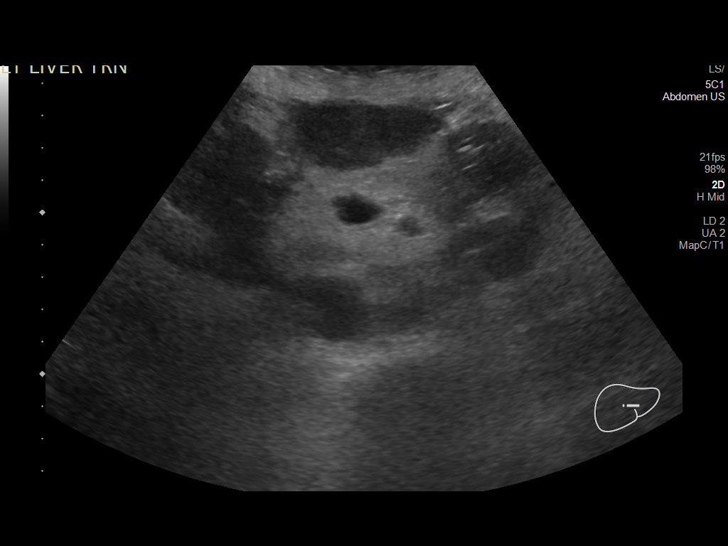
[im 13/33]
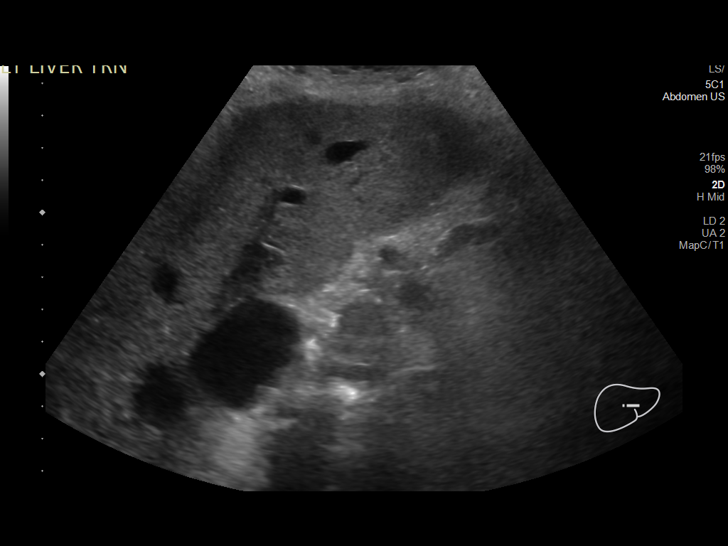
[im 15/33]
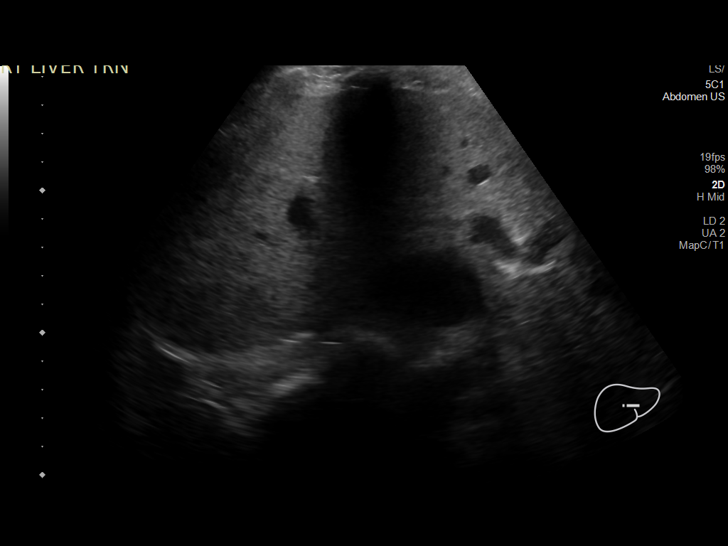
[im 18/33]
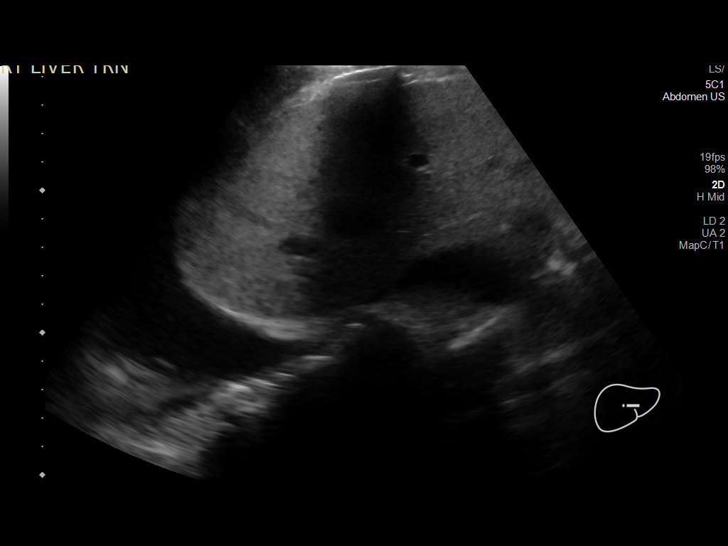
[im 21/33]
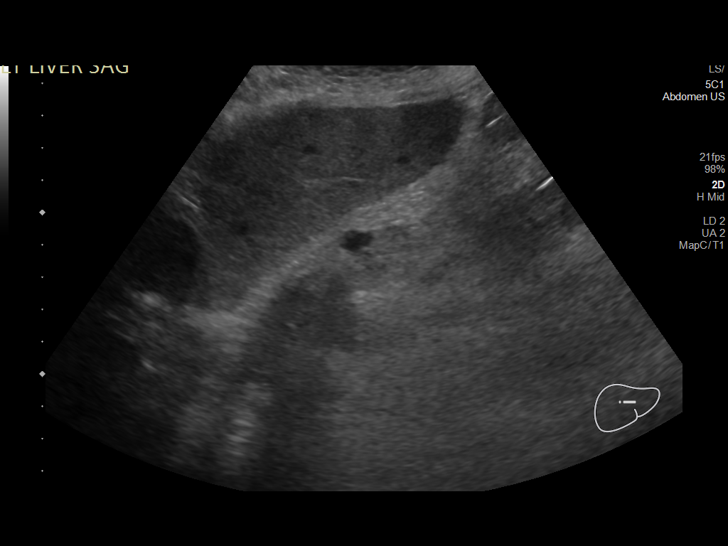
[im 22/33]
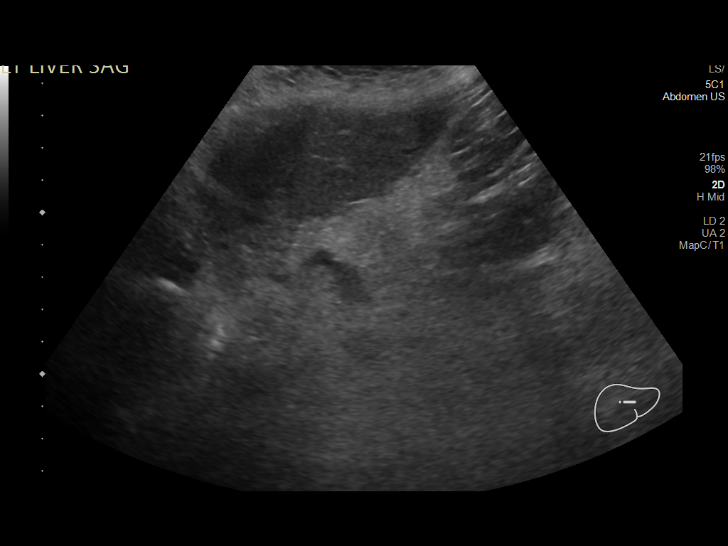
[im 25/33]
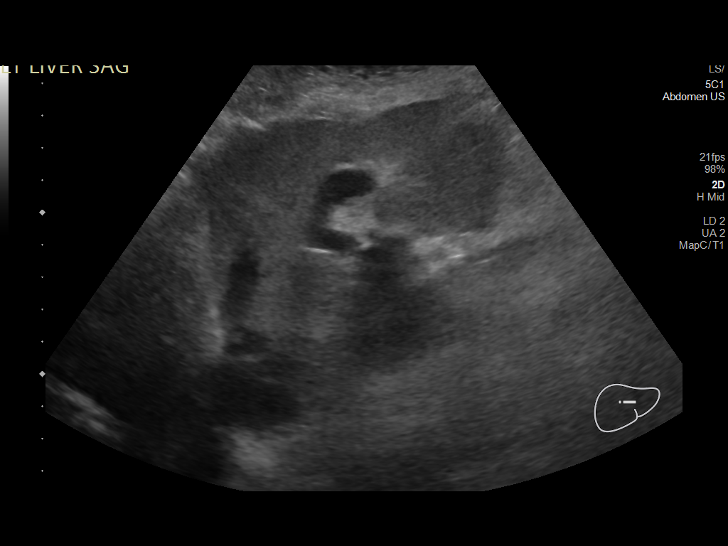
[im 27/33]
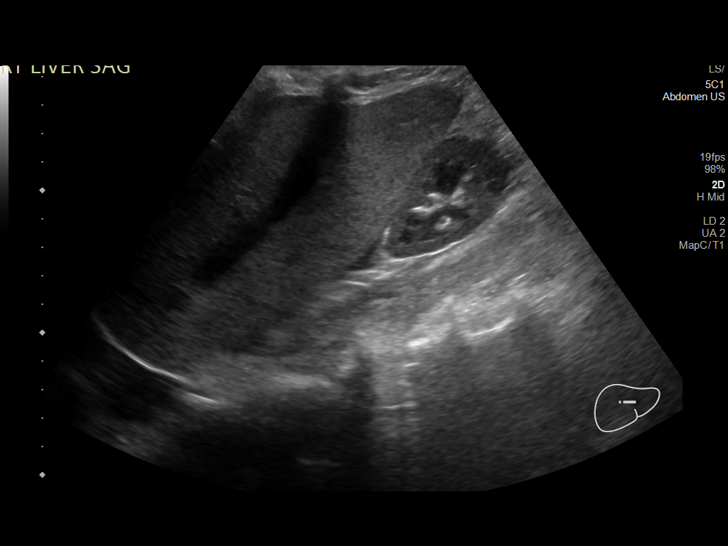
[im 30/33]
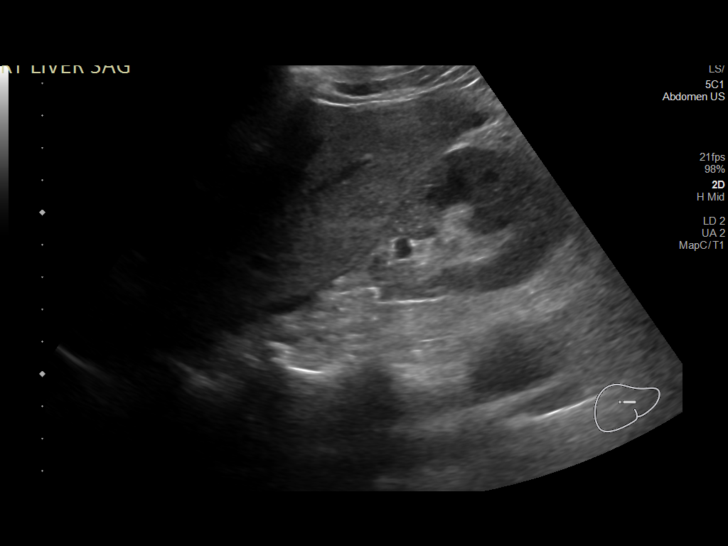
[im 33/33]
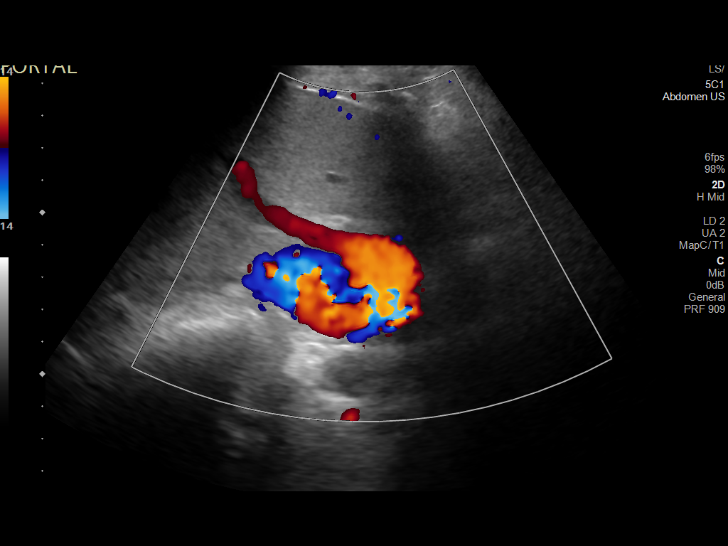

[14 of 25 positions shown; findings below may reference images not displayed]

FINDINGS: Gallbladder:

Surgically absent.

Common bile duct:

Diameter: 4.6 mm.

Liver:

No focal lesion identified. The liver has a slightly nodular border
inferiorly. Increased parenchymal echogenicity. Portal vein is
patent on color Doppler imaging with normal direction of blood flow
towards the liver.

Other: A trace amount of free fluid is noted in the right upper
quadrant. Small right pleural effusion.
IMPRESSION: 1. Hepatic steatosis. The liver has a slightly nodular border
inferiorly which may be associated with underlying cirrhosis.
2. Trace ascites.
3. Small right pleural effusion.
4. Status post cholecystectomy.

## 2023-12-17 ENCOUNTER — Encounter: Payer: Self-pay | Admitting: Internal Medicine

## 2023-12-17 ENCOUNTER — Ambulatory Visit: Payer: Medicare Other | Attending: Internal Medicine | Admitting: Internal Medicine

## 2023-12-17 VITALS — BP 126/70 | HR 84 | Ht 59.0 in | Wt 120.8 lb

## 2023-12-17 DIAGNOSIS — I4581 Long QT syndrome: Secondary | ICD-10-CM | POA: Diagnosis not present

## 2023-12-17 DIAGNOSIS — Z950 Presence of cardiac pacemaker: Secondary | ICD-10-CM | POA: Diagnosis not present

## 2023-12-17 DIAGNOSIS — D649 Anemia, unspecified: Secondary | ICD-10-CM | POA: Diagnosis not present

## 2023-12-17 LAB — CUP PACEART INCLINIC DEVICE CHECK
Date Time Interrogation Session: 20250228162455
Implantable Lead Connection Status: 753985
Implantable Lead Connection Status: 753985
Implantable Lead Connection Status: 753985
Implantable Lead Implant Date: 20090226
Implantable Lead Implant Date: 20090226
Implantable Lead Implant Date: 20241129
Implantable Lead Location: 753859
Implantable Lead Location: 753860
Implantable Lead Location: 753860
Implantable Lead Model: 3830
Implantable Lead Model: 5076
Implantable Lead Model: 5076
Implantable Pulse Generator Implant Date: 20241129

## 2023-12-17 NOTE — Progress Notes (Signed)
 Patient Care Team: Marylen Ponto, MD as PCP - General (Family Medicine) Dulce Sellar Iline Oven, MD as PCP - Cardiology (Cardiology) Marylen Ponto, MD (Family Medicine)   HPI  [name] [name] is a 78 y.o. female seen in follow-up for pacemaker implanted 2009 lost to follow-up who presented to the hospital 2024 with symptoms of shortness of breath weakness after running out of her tranquilizers.  UDS was positive for THC and methadone and benzos.  She was also found to have a a descending aortic dissection with a pacemaker at ERI x 1 year with ventricular asynchronous pacing. Nonischemic cardiomyopathy wide QRS.  She underwent generator replacement 11/24 with left bundle branch area lead with a 25 ms narrowing of her baseline QRS in LV-V6 stim of 110 ms  Has a history of a mechanical mitral valve replacement and TAVR 2022 depressed left ventricular function coronary artery disease with prior stenting  No chest pain.  Some shortness of breath and fatigue.  Cold with the warfarin.  No bleeding. Date Cr K Hgb  11/24 0.85 3.9 10.5<< 13.2          2/25 echocardiogram EF 60-65%  Records and Results Reviewed 7 regret not having been much able  Past Medical History:  Diagnosis Date   Anxiety    Atrial arrhythmia    With prior history of atrial flutter ablation   Bradycardia    s/p pacemaker implantation   Chronic anticoagulation 08/26/2021   Chronic combined systolic and diastolic CHF (congestive heart failure) (HCC) 09/30/2021   CKD (chronic kidney disease) stage 3, GFR 30-59 ml/min (HCC) 08/26/2021   Closed right hip fracture (HCC) 08/11/2022   CONGESTIVE HEART FAILURE 07/31/2010   Qualifier: Diagnosis of  By: Graciela Husbands, MD, Susie Cassette    Coronary atherosclerosis 06/08/2014   Last Assessment & Plan: Formatting of this note might be different from the original. Complains of consistent and persistent shortness of breath.  She has been experiencing some bilateral arm pain.   She is certain she did not experience that discomfort but that was not her anginal equivalent when she had a myocardial infarction several years back.  With that said, symptoms do improve with the subl   DNR (do not resuscitate) 09/30/2021   Dysphagia 03/10/2023   Essential hypertension 07/31/2010   Qualifier: Diagnosis of   By: Graciela Husbands, MD, Susie Cassette      Fall at home, initial encounter 08/11/2022   GERD (gastroesophageal reflux disease)    H/O mitral valve replacement with mechanical valve 10/14/2021   Hypothyroidism    Influenza A 08/26/2021   Left ventricular dysfunction 06/08/2014   Long Q-T syndrome    Malnutrition of moderate degree 08/28/2021   Mixed hyperlipidemia 12/12/2019   Myocardial infarct (HCC) 11/20/2011   Stent placement for 100% blockage of LAD   Pacemaker reprogramming/check 10/19/2008   Last Assessment & Plan: Formatting of this note might be different from the original. Normal Device Function ----- NEARING Elective Replacement ---- Presents in sinus rhythm. No Permanent Device Reprogramming Required -  Follow up 3-4 months, remote monitoring on a quarterly basis, as per protocol.  Encouraged follow up with PCP and Cardiologist.   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 Excell Seltzer and  Dr. Laneta Simmers   Senile nuclear sclerosis 10/17/2015   Severe aortic stenosis 08/26/2021   SHORTNESS OF BREATH 07/31/2010   Qualifier: Diagnosis of   By: Graciela Husbands, MD, Susie Cassette      Supratherapeutic INR 08/11/2022   Thoracic aortic aneurysm 05/13/2021   Transaminitis    TRICUSPID REGURGITATION 12/23/2010   Qualifier: Diagnosis of  By: Graciela Husbands, MD, Susie Cassette     Past Surgical History:  Procedure Laterality  Date   APPENDECTOMY     BIV UPGRADE N/A 09/17/2023   Procedure: BIV Jennette Banker;  Surgeon: Duke Salvia, MD;  Location: [facility] INVASIVE CV LAB;  Service: Cardiovascular;  Laterality: N/A;   CARDIAC ELECTROPHYSIOLOGY STUDY AND ABLATION     Stent and pacemaker placement   CHOLECYSTECTOMY     CORONARY ANGIOPLASTY WITH STENT PLACEMENT Left 09/13/2015   Dr. Josefa Half at [facility], LAD stent   INTRAOPERATIVE TRANSTHORACIC ECHOCARDIOGRAM N/A 10/14/2021   Procedure: INTRAOPERATIVE TRANSTHORACIC ECHOCARDIOGRAM;  Surgeon: Tonny Bollman, MD;  Location: [facility] OR;  Service: Open Heart Surgery;  Laterality: N/A;   MITRAL VALVE REPLACEMENT  2001   mechanical valve   OOPHORECTOMY     PACEMAKER INSERTION  11/20/2007   Dual chamber pacemaker implantation with rapid ventricular pacing   PPM GENERATOR CHANGEOUT N/A 09/17/2023   Procedure: PPM GENERATOR CHANGEOUT;  Surgeon: Duke Salvia, MD;  Location: [facility] INVASIVE CV LAB;  Service: Cardiovascular;  Laterality: N/A;   RIGHT/LEFT HEART CATH AND CORONARY ANGIOGRAPHY N/A 10/03/2021   Procedure: RIGHT/LEFT HEART CATH AND CORONARY ANGIOGRAPHY;  Surgeon: Tonny Bollman, MD;  Location: [facility] INVASIVE CV LAB;  Service: Cardiovascular;  Laterality: N/A;   SALPINGECTOMY Bilateral    TONSILLECTOMY     TOTAL HIP ARTHROPLASTY Right 08/12/2022   Procedure: RIGHT TOTAL HIP ARTHROPLASTY;  Surgeon: Joen Laura, MD;  Location: [facility] OR;  Service: Orthopedics;  Laterality: Right;   TRANSCATHETER AORTIC VALVE REPLACEMENT, TRANSFEMORAL N/A 10/14/2021   Procedure: TRANSCATHETER AORTIC VALVE REPLACEMENT, TRANSFEMORAL;  Surgeon: Tonny Bollman, MD;  Location: [facility] OR;  Service: Open Heart Surgery;  Laterality: N/A;    Current Meds  Medication Sig   acetaminophen (TYLENOL) 500 MG tablet Take 500 mg by mouth every 6 (six) hours as needed for mild pain (pain score 1-3).   albuterol (VENTOLIN HFA) 108 (90 Base) MCG/ACT inhaler Inhale 2 puffs into the lungs as needed for shortness of  breath.   alprazolam (XANAX) 2 MG tablet Take 0.5 tablets (1 mg total) by mouth 2 (two) times daily as needed for anxiety.   atorvastatin (LIPITOR) 40 MG tablet Take 1 tablet (40 mg total) by mouth 2 (two) times a week.   cyclobenzaprine (FLEXERIL) 10 MG tablet Take 10 mg by mouth daily as needed for muscle spasms.   DILT-XR 240 MG 24 hr capsule Take 240 mg by mouth daily.   HYDROCHLOROTHIAZIDE PO Take by mouth daily.   levothyroxine (SYNTHROID) 75 MCG tablet Take 75 mcg by mouth daily before breakfast.   magnesium oxide (MAG-OX) 400 (240 Mg) MG tablet Take 400 mg by mouth daily.   Menthol, Topical Analgesic, 2 % GEL Apply 1 Application topically as needed (pain).   nitroGLYCERIN (NITROSTAT) 0.4 MG SL tablet Place 1 tablet (0.4 mg total) under the tongue every 5 (five) minutes as needed for chest pain.   pantoprazole (PROTONIX) 40 MG tablet TAKE 1 TABLET BY MOUTH EVERY DAY   sacubitril-valsartan (ENTRESTO) 24-26 MG Take 1 tablet by mouth 2 (two) times daily.   warfarin (COUMADIN) 5 MG  tablet Take 7.5 mg by mouth every evening.   [DISCONTINUED] furosemide (LASIX) 20 MG tablet Take 40 mg by mouth 2 (two) times daily.    Allergies  Allergen Reactions   Atenolol Other (See Comments)    HR and pulse dropped   Lisinopril Other (See Comments)    Cluster headache x 1 month   Metoprolol Succinate [Metoprolol] Other (See Comments)    HR and pulse dropped   Baclofen Anxiety and Other (See Comments)    Makes the patient feel anxious the day after taking this. "Makes me jerk"    Latex Rash      Review of Systems negative except from HPI and PMH  Physical Exam BP 126/70   Pulse 84   Ht 4\' 11"  (1.499 m)   Wt 120 lb 12.8 oz (54.8 kg)   SpO2 97%   BMI 24.40 kg/m  Well developed and well nourished in no acute distress HENT normal E scleral and icterus clear Neck Supple JVP flat; carotids brisk and full Clear to ausculation Regular rate and rhythm, S1 and 2/6 systolic murmur soft with  active bowel sounds No clubbing cyanosis  Edema Alert and oriented, grossly normal motor and sensory function Skin Warm and Dry  ECG AV pacing at 84 Intervals 14/17/47  Following reprobed pending P synchronous pacing with Intervals 09/30/1939   CrCl cannot be calculated (Patient's most recent lab result is older than the maximum 21 days allowed.).   Assessment and  Plan  Tachybradycardia syndrome  Ischemic heart disease with prior stenting  Cardiomyopathy ischemic/nonischemic  Valvular heart disease with mechanical MVR/TAVR  Pacemaker-Medtronic with left bundle branch area pacing upgrade 11/24  Significant improvement in resynchronization following reprogramming now with simultaneous AV pacing and AV delay of 100 ms.  She will follow-up with Dr. Dulce Sellar.  Will arrange follow-up for pacemaker with Dr. Derek Jack.   Continue Coumadin anticoagulation for mechanical mitral valve.  Currently no indication for adjunctive aspirin.    Current medicines are reviewed at length with the patient today .  The patient does not  have concerns regarding medicines.

## 2023-12-17 NOTE — Patient Instructions (Addendum)
 Medication Instructions:  Your physician recommends that you continue on your current medications as directed. Please refer to the Current Medication list given to you today.  *If you need a refill on your cardiac medications before your next appointment, please call your pharmacy*   Lab Work: CBC today at [facility]  If you have labs (blood work) drawn today and your tests are completely normal, you will receive your results only by: MyChart Message (if you have MyChart) OR A paper copy in the mail If you have any lab test that is abnormal or we need to change your treatment, we will call you to review the results.   Testing/Procedures: None ordered.     Follow-Up: At [facility], you and your health needs are our priority.  As part of our continuing mission to provide you with exceptional heart care, we have created designated Provider Care Teams.  These Care Teams include your primary Cardiologist (physician) and Advanced Practice Providers (APPs -  Physician Assistants and Nurse Practitioners) who all work together to provide you with the care you need, when you need it.  We recommend signing up for the patient portal called "MyChart".  Sign up information is provided on this After Visit Summary.  MyChart is used to connect with patients for Virtual Visits (Telemedicine).  Patients are able to view lab/test results, encounter notes, upcoming appointments, etc.  Non-urgent messages can be sent to your provider as well.   To learn more about what you can do with MyChart, go to [email or web address].    Your next appointment:   9 months with Dr Elberta Fortis

## 2023-12-18 LAB — CBC
Hematocrit: 40.7 % (ref 34.0–46.6)
Hemoglobin: 12.9 g/dL (ref 11.1–15.9)
MCH: 29.1 pg (ref 26.6–33.0)
MCHC: 31.7 g/dL (ref 31.5–35.7)
MCV: 92 fL (ref 79–97)
Platelets: 148 10*3/uL — ABNORMAL LOW (ref 150–450)
RBC: 4.44 x10E6/uL (ref 3.77–5.28)
RDW: 14.8 % (ref 11.7–15.4)
WBC: 4.9 10*3/uL (ref 3.4–10.8)

## 2023-12-23 ENCOUNTER — Telehealth: Payer: Self-pay | Admitting: Cardiology

## 2023-12-23 MED ORDER — SACUBITRIL-VALSARTAN 24-26 MG PO TABS: 1.0000 | ORAL_TABLET | Freq: Two times a day (BID) | ORAL | 1 refills | Status: DC

## 2023-12-23 NOTE — Telephone Encounter (Signed)
 Rx refill sent to pharmacy.

## 2023-12-23 NOTE — Telephone Encounter (Signed)
*  STAT* If patient is at the pharmacy, call can be transferred to refill team.   1. Which medications need to be refilled? (please list name of each medication and dose if known) sacubitril-valsartan (ENTRESTO) 24-26 MG   2. Which pharmacy/location (including street and city if local pharmacy) is medication to be sent to?  [id number] - [place], [place] - [address]    3. Do they need a 30 day or 90 day supply? 90

## 2023-12-30 ENCOUNTER — Ambulatory Visit: Payer: Medicare Other

## 2023-12-30 DIAGNOSIS — Z952 Presence of prosthetic heart valve: Secondary | ICD-10-CM

## 2023-12-30 LAB — CUP PACEART REMOTE DEVICE CHECK
Battery Remaining Longevity: 99 mo
Battery Voltage: 3.07 V
Brady Statistic AP VP Percent: 68.47 %
Brady Statistic AP VS Percent: 0.66 %
Brady Statistic AS VP Percent: 25.16 %
Brady Statistic AS VS Percent: 5.7 %
Brady Statistic RA Percent Paced: 59.9 %
Brady Statistic RV Percent Paced: 90.25 %
Date Time Interrogation Session: 20250313001927
Implantable Lead Connection Status: 753985
Implantable Lead Connection Status: 753985
Implantable Lead Connection Status: 753985
Implantable Lead Implant Date: 20090226
Implantable Lead Implant Date: 20090226
Implantable Lead Implant Date: 20241129
Implantable Lead Location: 753859
Implantable Lead Location: 753860
Implantable Lead Location: 753860
Implantable Lead Model: 3830
Implantable Lead Model: 5076
Implantable Lead Model: 5076
Implantable Pulse Generator Implant Date: 20241129
Lead Channel Impedance Value: 247 Ohm
Lead Channel Impedance Value: 323 Ohm
Lead Channel Impedance Value: 342 Ohm
Lead Channel Impedance Value: 342 Ohm
Lead Channel Impedance Value: 380 Ohm
Lead Channel Impedance Value: 399 Ohm
Lead Channel Impedance Value: 418 Ohm
Lead Channel Impedance Value: 494 Ohm
Lead Channel Impedance Value: 551 Ohm
Lead Channel Pacing Threshold Amplitude: 0.75 V
Lead Channel Pacing Threshold Amplitude: 0.875 V
Lead Channel Pacing Threshold Amplitude: 2.25 V
Lead Channel Pacing Threshold Pulse Width: 0.4 ms
Lead Channel Pacing Threshold Pulse Width: 0.4 ms
Lead Channel Pacing Threshold Pulse Width: 0.4 ms
Lead Channel Sensing Intrinsic Amplitude: 1.625 mV
Lead Channel Sensing Intrinsic Amplitude: 1.625 mV
Lead Channel Sensing Intrinsic Amplitude: 4.75 mV
Lead Channel Sensing Intrinsic Amplitude: 4.75 mV
Lead Channel Setting Pacing Amplitude: 1.5 V
Lead Channel Setting Pacing Amplitude: 1.5 V
Lead Channel Setting Pacing Amplitude: 2.5 V
Lead Channel Setting Pacing Pulse Width: 0.4 ms
Lead Channel Setting Pacing Pulse Width: 0.8 ms
Lead Channel Setting Sensing Sensitivity: 1.2 mV
Zone Setting Status: 755011
Zone Setting Status: 755011

## 2024-01-05 DIAGNOSIS — I5020 Unspecified systolic (congestive) heart failure: Secondary | ICD-10-CM | POA: Diagnosis not present

## 2024-01-07 DIAGNOSIS — I10 Essential (primary) hypertension: Secondary | ICD-10-CM | POA: Diagnosis not present

## 2024-01-07 DIAGNOSIS — Z0000 Encounter for general adult medical examination without abnormal findings: Secondary | ICD-10-CM | POA: Diagnosis not present

## 2024-01-07 DIAGNOSIS — Z79899 Other long term (current) drug therapy: Secondary | ICD-10-CM | POA: Diagnosis not present

## 2024-01-07 LAB — LAB REPORT - SCANNED: EGFR: 53

## 2024-01-12 DIAGNOSIS — Z952 Presence of prosthetic heart valve: Secondary | ICD-10-CM | POA: Diagnosis not present

## 2024-01-12 DIAGNOSIS — Z7901 Long term (current) use of anticoagulants: Secondary | ICD-10-CM | POA: Diagnosis not present

## 2024-01-31 DIAGNOSIS — Z961 Presence of intraocular lens: Secondary | ICD-10-CM | POA: Diagnosis not present

## 2024-01-31 DIAGNOSIS — H524 Presbyopia: Secondary | ICD-10-CM | POA: Diagnosis not present

## 2024-02-05 DIAGNOSIS — I5020 Unspecified systolic (congestive) heart failure: Secondary | ICD-10-CM | POA: Diagnosis not present

## 2024-02-11 NOTE — Progress Notes (Signed)
 Remote pacemaker transmission.

## 2024-02-20 ENCOUNTER — Other Ambulatory Visit: Payer: Self-pay | Admitting: Cardiology

## 2024-03-06 DIAGNOSIS — I5020 Unspecified systolic (congestive) heart failure: Secondary | ICD-10-CM | POA: Diagnosis not present

## 2024-03-17 DIAGNOSIS — Z7901 Long term (current) use of anticoagulants: Secondary | ICD-10-CM | POA: Diagnosis not present

## 2024-03-17 DIAGNOSIS — Z952 Presence of prosthetic heart valve: Secondary | ICD-10-CM | POA: Diagnosis not present

## 2024-03-30 ENCOUNTER — Ambulatory Visit (INDEPENDENT_AMBULATORY_CARE_PROVIDER_SITE_OTHER): Payer: Medicare Other

## 2024-03-30 DIAGNOSIS — I480 Paroxysmal atrial fibrillation: Secondary | ICD-10-CM

## 2024-03-31 LAB — CUP PACEART REMOTE DEVICE CHECK
Battery Remaining Longevity: 91 mo
Battery Voltage: 3.01 V
Brady Statistic AP VP Percent: 67.03 %
Brady Statistic AP VS Percent: 0.36 %
Brady Statistic AS VP Percent: 27.86 %
Brady Statistic AS VS Percent: 4.75 %
Brady Statistic RA Percent Paced: 66.28 %
Brady Statistic RV Percent Paced: 94.48 %
Date Time Interrogation Session: 20250612040615
Implantable Lead Connection Status: 753985
Implantable Lead Connection Status: 753985
Implantable Lead Connection Status: 753985
Implantable Lead Implant Date: 20090226
Implantable Lead Implant Date: 20090226
Implantable Lead Implant Date: 20241129
Implantable Lead Location: 753859
Implantable Lead Location: 753860
Implantable Lead Location: 753860
Implantable Lead Model: 3830
Implantable Lead Model: 5076
Implantable Lead Model: 5076
Implantable Pulse Generator Implant Date: 20241129
Lead Channel Impedance Value: 228 Ohm
Lead Channel Impedance Value: 285 Ohm
Lead Channel Impedance Value: 304 Ohm
Lead Channel Impedance Value: 323 Ohm
Lead Channel Impedance Value: 342 Ohm
Lead Channel Impedance Value: 361 Ohm
Lead Channel Impedance Value: 380 Ohm
Lead Channel Impedance Value: 418 Ohm
Lead Channel Impedance Value: 475 Ohm
Lead Channel Pacing Threshold Amplitude: 0.75 V
Lead Channel Pacing Threshold Amplitude: 0.875 V
Lead Channel Pacing Threshold Amplitude: 2.125 V
Lead Channel Pacing Threshold Pulse Width: 0.4 ms
Lead Channel Pacing Threshold Pulse Width: 0.4 ms
Lead Channel Pacing Threshold Pulse Width: 0.4 ms
Lead Channel Sensing Intrinsic Amplitude: 2 mV
Lead Channel Sensing Intrinsic Amplitude: 2 mV
Lead Channel Sensing Intrinsic Amplitude: 6.875 mV
Lead Channel Sensing Intrinsic Amplitude: 6.875 mV
Lead Channel Setting Pacing Amplitude: 1.5 V
Lead Channel Setting Pacing Amplitude: 1.5 V
Lead Channel Setting Pacing Amplitude: 2.5 V
Lead Channel Setting Pacing Pulse Width: 0.4 ms
Lead Channel Setting Pacing Pulse Width: 0.8 ms
Lead Channel Setting Sensing Sensitivity: 1.2 mV
Zone Setting Status: 755011
Zone Setting Status: 755011

## 2024-04-05 ENCOUNTER — Other Ambulatory Visit: Payer: Self-pay | Admitting: Cardiology

## 2024-04-06 DIAGNOSIS — I5020 Unspecified systolic (congestive) heart failure: Secondary | ICD-10-CM | POA: Diagnosis not present

## 2024-05-05 ENCOUNTER — Other Ambulatory Visit: Payer: Self-pay | Admitting: Cardiology

## 2024-05-18 DIAGNOSIS — Z7901 Long term (current) use of anticoagulants: Secondary | ICD-10-CM | POA: Diagnosis not present

## 2024-05-18 DIAGNOSIS — Z952 Presence of prosthetic heart valve: Secondary | ICD-10-CM | POA: Diagnosis not present

## 2024-05-24 NOTE — Addendum Note (Signed)
 Addended by: VICCI SELLER A on: 05/24/2024 08:47 AM   Modules accepted: Orders

## 2024-05-24 NOTE — Progress Notes (Signed)
 Remote pacemaker transmission.

## 2024-06-09 DIAGNOSIS — N3001 Acute cystitis with hematuria: Secondary | ICD-10-CM | POA: Diagnosis not present

## 2024-06-09 DIAGNOSIS — Z79899 Other long term (current) drug therapy: Secondary | ICD-10-CM | POA: Diagnosis not present

## 2024-06-09 DIAGNOSIS — Z792 Long term (current) use of antibiotics: Secondary | ICD-10-CM | POA: Diagnosis not present

## 2024-06-09 DIAGNOSIS — Z7901 Long term (current) use of anticoagulants: Secondary | ICD-10-CM | POA: Diagnosis not present

## 2024-06-29 ENCOUNTER — Ambulatory Visit (INDEPENDENT_AMBULATORY_CARE_PROVIDER_SITE_OTHER): Payer: Medicare Other

## 2024-06-29 DIAGNOSIS — I480 Paroxysmal atrial fibrillation: Secondary | ICD-10-CM

## 2024-06-29 LAB — CUP PACEART REMOTE DEVICE CHECK
Battery Remaining Longevity: 85 mo
Battery Voltage: 2.99 V
Brady Statistic AP VP Percent: 87.87 %
Brady Statistic AP VS Percent: 0.33 %
Brady Statistic AS VP Percent: 10.45 %
Brady Statistic AS VS Percent: 1.36 %
Brady Statistic RA Percent Paced: 88.68 %
Brady Statistic RV Percent Paced: 98.29 %
Date Time Interrogation Session: 20250911052313
Implantable Lead Connection Status: 753985
Implantable Lead Connection Status: 753985
Implantable Lead Connection Status: 753985
Implantable Lead Implant Date: 20090226
Implantable Lead Implant Date: 20090226
Implantable Lead Implant Date: 20241129
Implantable Lead Location: 753859
Implantable Lead Location: 753860
Implantable Lead Location: 753860
Implantable Lead Model: 3830
Implantable Lead Model: 5076
Implantable Lead Model: 5076
Implantable Pulse Generator Implant Date: 20241129
Lead Channel Impedance Value: 209 Ohm
Lead Channel Impedance Value: 266 Ohm
Lead Channel Impedance Value: 285 Ohm
Lead Channel Impedance Value: 304 Ohm
Lead Channel Impedance Value: 323 Ohm
Lead Channel Impedance Value: 342 Ohm
Lead Channel Impedance Value: 361 Ohm
Lead Channel Impedance Value: 399 Ohm
Lead Channel Impedance Value: 437 Ohm
Lead Channel Pacing Threshold Amplitude: 0.75 V
Lead Channel Pacing Threshold Amplitude: 1 V
Lead Channel Pacing Threshold Amplitude: 1.75 V
Lead Channel Pacing Threshold Pulse Width: 0.4 ms
Lead Channel Pacing Threshold Pulse Width: 0.4 ms
Lead Channel Pacing Threshold Pulse Width: 0.4 ms
Lead Channel Sensing Intrinsic Amplitude: 1 mV
Lead Channel Sensing Intrinsic Amplitude: 1 mV
Lead Channel Sensing Intrinsic Amplitude: 4 mV
Lead Channel Sensing Intrinsic Amplitude: 4 mV
Lead Channel Setting Pacing Amplitude: 1.5 V
Lead Channel Setting Pacing Amplitude: 1.5 V
Lead Channel Setting Pacing Amplitude: 2.5 V
Lead Channel Setting Pacing Pulse Width: 0.4 ms
Lead Channel Setting Pacing Pulse Width: 0.8 ms
Lead Channel Setting Sensing Sensitivity: 1.2 mV
Zone Setting Status: 755011
Zone Setting Status: 755011

## 2024-07-03 DIAGNOSIS — Z952 Presence of prosthetic heart valve: Secondary | ICD-10-CM | POA: Diagnosis not present

## 2024-07-03 DIAGNOSIS — Z7901 Long term (current) use of anticoagulants: Secondary | ICD-10-CM | POA: Diagnosis not present

## 2024-07-06 NOTE — Progress Notes (Signed)
 Remote PPM Transmission

## 2024-07-07 DIAGNOSIS — I5020 Unspecified systolic (congestive) heart failure: Secondary | ICD-10-CM | POA: Diagnosis not present

## 2024-07-09 ENCOUNTER — Ambulatory Visit: Payer: Self-pay | Admitting: Cardiology

## 2024-07-17 ENCOUNTER — Other Ambulatory Visit: Payer: Self-pay | Admitting: Cardiology

## 2024-07-26 ENCOUNTER — Encounter: Payer: Self-pay | Admitting: *Deleted

## 2024-07-27 ENCOUNTER — Ambulatory Visit: Attending: Cardiology | Admitting: Cardiology

## 2024-07-27 NOTE — Progress Notes (Deleted)
 Cardiology Office Note:    Date:  07/27/2024   ID:  [name] [name], DOB [birth year], MRN [id number]  PCP:  Ina Marcellus RAMAN, MD  Cardiologist:  Redell Leiter, MD    Referring MD: Ina Marcellus RAMAN, MD    ASSESSMENT:    1. H/O mitral valve replacement with mechanical valve   2. Chronic anticoagulation   3. PAF (paroxysmal atrial fibrillation) (HCC)   4. Presence of permanent cardiac pacemaker   5. S/P TAVR (transcatheter aortic valve replacement)   6. Hypertensive heart disease with heart failure (HCC)   7. Coronary artery disease of native artery of native heart with stable angina pectoris   8. Nonrheumatic tricuspid valve regurgitation    PLAN:    In order of problems listed above:  ***   Next appointment: ***   Medication Adjustments/Labs and Tests Ordered: Current medicines are reviewed at length with the patient today.  Concerns regarding medicines are outlined above.  No orders of the defined types were placed in this encounter.  No orders of the defined types were placed in this encounter.    History of Present Illness:    [name] [name] is a 78 y.o. female with a hx of heart disease including rheumatic heart disease mechanical MVR more than 20 years ago at [facility] paroxysmal atrial fibrillation permanent pacemaker thoracic aortic aneurysm CAD with previous PCI and stent thrombocytopenia and most subsequently critical aortic stenosis with TAVR 2023 as well as the COPD last seen 03/17/2023.  For valve surveillance she had echocardiogram performed 12/10/2023 valve function was normal ascending aorta was dilated at 40 mm left ventricle had mild LVH normal systolic function left atrium was severely dilated and there was moderate to severe tricuspid regurgitation present with moderate elevation of pulmonary artery pressure. Compliance with diet, lifestyle and medications: *** Past Medical History:  Diagnosis Date   Anxiety    Atrial arrhythmia    With prior  history of atrial flutter ablation   Bradycardia    s/p pacemaker implantation   Chronic anticoagulation 08/26/2021   Chronic combined systolic and diastolic CHF (congestive heart failure) (HCC) 09/30/2021   CKD (chronic kidney disease) stage 3, GFR 30-59 ml/min (HCC) 08/26/2021   Closed right hip fracture (HCC) 08/11/2022   CONGESTIVE HEART FAILURE 07/31/2010   Qualifier: Diagnosis of  By: Fernande, MD, CODY Elspeth Darner    Coronary atherosclerosis 06/08/2014   Last Assessment & Plan: Formatting of this note might be different from the original. Complains of consistent and persistent shortness of breath.  She has been experiencing some bilateral arm pain.  She is certain she did not experience that discomfort but that was not her anginal equivalent when she had a myocardial infarction several years back.  With that said, symptoms do improve with the subl   DNR (do not resuscitate) 09/30/2021   Dysphagia 03/10/2023   Essential hypertension 07/31/2010   Qualifier: Diagnosis of   By: Fernande, MD, CODY Elspeth Darner      Fall at home, initial encounter 08/11/2022   GERD (gastroesophageal reflux disease)    H/O mitral valve replacement with mechanical valve 10/14/2021   Hypothyroidism    Influenza A 08/26/2021   Left ventricular dysfunction 06/08/2014   Long Q-T syndrome    Malnutrition of moderate degree 08/28/2021   Mixed hyperlipidemia 12/12/2019   Myocardial infarct (HCC) 11/20/2011   Stent placement for 100% blockage of LAD   Pacemaker reprogramming/check 10/19/2008   Last Assessment & Plan: Formatting of this note  might be different from the original. Normal Device Function ----- NEARING Elective Replacement ---- Presents in sinus rhythm. No Permanent Device Reprogramming Required -  Follow up 3-4 months, remote monitoring on a quarterly basis, as per protocol.  Encouraged follow up with PCP and Cardiologist.   PAF (paroxysmal atrial fibrillation) (HCC) 08/11/2022   Rheumatic heart  disease    With mitral valve replacement, tricuspid regurgitation moderate to severe, significant left atrial enlargment   S/P TAVR (transcatheter aortic valve replacement) 10/14/2021   Edwards 23mm S3U via TF approach with Dr. Wonda and Dr. Lucas   Senile nuclear sclerosis 10/17/2015   Severe aortic stenosis 08/26/2021   SHORTNESS OF BREATH 07/31/2010   Qualifier: Diagnosis of   By: Fernande, MD, CODY Elspeth Darner      Supratherapeutic INR 08/11/2022   Thoracic aortic aneurysm 05/13/2021   Transaminitis    TRICUSPID REGURGITATION 12/23/2010   Qualifier: Diagnosis of  By: Fernande, MD, CODY Elspeth Darner     Current Medications: No outpatient medications have been marked as taking for the 07/27/24 encounter (Appointment) with Monetta Redell PARAS, MD.      EKGs/Labs/Other Studies Reviewed:    The following studies were reviewed today:  Cardiac Studies & Procedures   ______________________________________________________________________________________________ CARDIAC CATHETERIZATION  CARDIAC CATHETERIZATION 10/03/2021  Conclusion   Mid LAD lesion is 20% stenosed.   1st Mrg lesion is 40% stenosed.   Prox RCA lesion is 30% stenosed.   Hemodynamic findings consistent with moderate pulmonary hypertension.   There is severe aortic valve stenosis.  1.  Patent coronary arteries with diffuse nonobstructive CAD, continued patency of the mid LAD stent 2.  Known severe aortic stenosis with heavy calcification and restriction of the aortic valve leaflets 3.  Normal fluoroscopic motion of the patient's mechanical mitral prosthesis 4.  Moderate pulmonary hypertension with mean PA pressure 36 mmHg, transpulmonary gradient 13 mmHg, PVR 3.8 Wood units  Recommend: Resume warfarin today, start Lovenox  tomorrow morning for bridging, tentatively plan TAVR 12/27 we will coordinate hospital admission for bridging into the procedure.  Medical therapy for nonobstructive CAD.  Continue IV diuresis today  with elevated filling pressures.  Findings Coronary Findings Diagnostic  Dominance: Right  Left Main Vessel is large. The vessel exhibits minimal luminal irregularities.  Left Anterior Descending Mid LAD lesion is 20% stenosed. The lesion was previously treated using a stent (unknown type) over 2 years ago. Widely patent mid LAD stent  Left Circumflex There is mild diffuse disease throughout the vessel.  First Obtuse Marginal Branch 1st Mrg lesion is 40% stenosed. The first obtuse marginal branch divides into 2 major subbranches.  The more inferior branch has 40% calcific stenosis.  Right Coronary Artery There is mild diffuse disease throughout the vessel. Prox RCA lesion is 30% stenosed. The lesion is calcified.  Right Posterior Descending Artery The vessel exhibits minimal luminal irregularities.  Intervention  No interventions have been documented.     ECHOCARDIOGRAM  ECHOCARDIOGRAM COMPLETE 12/10/2023  Narrative ECHOCARDIOGRAM REPORT    Patient Name:   [name] [name] Date of Exam: 12/10/2023 Medical Rec #:  [id number]          Height:       59.0 in Accession #:    [id number]         Weight:       126.2 lb Date of Birth:  [birth year]          BSA:          1.517 m Patient Age:  78 years           BP:           130/70 mmHg Patient Gender: F                  HR:           77 bpm. Exam Location:    Procedure: 2D Echo, Cardiac Doppler and Color Doppler (Both Spectral and Color Flow Doppler were utilized during procedure).  Indications:    S/P TAVR (transcatheter aortic valve replacement) [Z95.2 (ICD-10-CM)]; H/O mitral valve replacement with mechanical valve [Z95.2 (ICD-10-CM)]; PAF (paroxysmal atrial fibrillation) (HCC) [I48.0 (ICD-10-CM)]; Chronic anticoagulation [Z79.01 (ICD-10-CM)]; Presence of permanent cardiac pacemaker [Z95.0 (ICD-10-CM)]; Essential hypertension [I10 (ICD-10-CM)]  History:        Patient has prior history of Echocardiogram  examinations, most recent 09/13/2023. Pacemaker, Aortic Valve Disease and Mitral Valve Disease, Arrythmias:Atrial Fibrillation; Risk Factors:Hypertension. Aortic Valve: 23 mm Ultra, stented (TAVR) valve is present in the aortic position. Procedure Date: 10/14/2021. Mitral Valve: mechanical valve valve is present in the mitral position.  Sonographer:    Charlie Jointer RDCS Referring Phys: [id number] Darril Patriarca J Elian Gloster  IMPRESSIONS   1. Left ventricular ejection fraction, by estimation, is 60 to 65%. The left ventricle has normal function. The left ventricle has no regional wall motion abnormalities. There is mild asymmetric left ventricular hypertrophy of the basal-septal segment. Left ventricular diastolic parameters are indeterminate. 2. Right ventricular systolic function is normal. The right ventricular size is normal. There is mildly elevated pulmonary artery systolic pressure. 3. Left atrial size was severely dilated. 4. MVR 20 y ago, type and size unknown. The mitral valve has been repaired/replaced. No evidence of mitral valve regurgitation. No evidence of mitral stenosis. The mean mitral valve gradient is 4.0 mmHg. There is a mechanical valve present in the mitral position. Echo findings are consistent with normal structure and function of the mitral valve prosthesis. 5. Tricuspid valve regurgitation is moderate to severe. 6. The aortic valve has been repaired/replaced. Aortic valve regurgitation is not visualized. No aortic stenosis is present. There is a 23 mm Ultra, stented (TAVR) valve present in the aortic position. Procedure Date: 10/14/2021. Echo findings are consistent with normal structure and function of the aortic valve prosthesis. Aortic valve mean gradient measures 7.0 mmHg. 7. Aneurysm of the ascending aorta, measuring 40 mm. 8. The inferior vena cava is normal in size with greater than 50% respiratory variability, suggesting right atrial pressure of 3 mmHg.  FINDINGS Left  Ventricle: Left ventricular ejection fraction, by estimation, is 60 to 65%. The left ventricle has normal function. The left ventricle has no regional wall motion abnormalities. Strain imaging was not performed. The left ventricular internal cavity size was normal in size. There is mild asymmetric left ventricular hypertrophy of the basal-septal segment. Left ventricular diastolic parameters are indeterminate.  Right Ventricle: The right ventricular size is normal. No increase in right ventricular wall thickness. Right ventricular systolic function is normal. There is mildly elevated pulmonary artery systolic pressure. The tricuspid regurgitant velocity is 3.18 m/s, and with an assumed right atrial pressure of 3 mmHg, the estimated right ventricular systolic pressure is 43.4 mmHg.  Left Atrium: Left atrial size was severely dilated.  Right Atrium: Right atrial size was normal in size.  Pericardium: There is no evidence of pericardial effusion.  Mitral Valve: MVR 20 y ago, type and size unknown. The mitral valve has been repaired/replaced. No evidence of mitral valve regurgitation. There is a mechanical  valve present in the mitral position. Echo findings are consistent with normal structure and function of the mitral valve prosthesis. No evidence of mitral valve stenosis. MV peak gradient, 10.0 mmHg. The mean mitral valve gradient is 4.0 mmHg.  Tricuspid Valve: The tricuspid valve is normal in structure. Tricuspid valve regurgitation is moderate to severe. No evidence of tricuspid stenosis.  Aortic Valve: The aortic valve has been repaired/replaced. Aortic valve regurgitation is not visualized. No aortic stenosis is present. Aortic valve mean gradient measures 7.0 mmHg. Aortic valve peak gradient measures 11.0 mmHg. There is a 23 mm Ultra, stented (TAVR) valve present in the aortic position. Procedure Date: 10/14/2021. Echo findings are consistent with normal structure and function of the aortic  valve prosthesis.  Pulmonic Valve: The pulmonic valve was normal in structure. Pulmonic valve regurgitation is mild. No evidence of pulmonic stenosis.  Aorta: The aortic root is normal in size and structure. There is an aneurysm involving the ascending aorta measuring 40 mm.  Venous: The inferior vena cava is normal in size with greater than 50% respiratory variability, suggesting right atrial pressure of 3 mmHg.  IAS/Shunts: No atrial level shunt detected by color flow Doppler.  Additional Comments: 3D imaging was not performed. A device lead is visualized in the right atrium and right ventricle.   LEFT VENTRICLE PLAX 2D LVIDd:         4.00 cm LVIDs:         2.80 cm LV PW:         1.20 cm LV IVS:        1.90 cm   RIGHT VENTRICLE             IVC RV Basal diam:  3.10 cm     IVC diam: 2.00 cm RV Mid diam:    2.50 cm RV S prime:     11.70 cm/s TAPSE (M-mode): 1.1 cm  LEFT ATRIUM              Index        RIGHT ATRIUM           Index LA diam:        3.10 cm  2.04 cm/m   RA Area:     16.40 cm LA Vol (A2C):   111.0 ml 73.19 ml/m  RA Volume:   44.50 ml  29.34 ml/m LA Vol (A4C):   86.1 ml  56.77 ml/m LA Biplane Vol: 97.2 ml  64.09 ml/m AORTIC VALVE                    PULMONIC VALVE AV Vmax:           165.50 cm/s  PR End Diast Vel: 7.18 msec AV Vmean:          125.500 cm/s AV VTI:            0.359 m AV Peak Grad:      11.0 mmHg AV Mean Grad:      7.0 mmHg LVOT Vmax:         116.00 cm/s LVOT Vmean:        90.600 cm/s LVOT VTI:          0.244 m LVOT/AV VTI ratio: 0.68  AORTA Ao Root diam: 3.30 cm Ao Asc diam:  4.00 cm Ao Desc diam: 1.30 cm  MITRAL VALVE                TRICUSPID VALVE MV Area (PHT): 2.93 cm  TR Peak grad:   40.4 mmHg MV Peak grad:  10.0 mmHg    TR Vmax:        318.00 cm/s MV Mean grad:  4.0 mmHg MV Vmax:       1.58 m/s     SHUNTS MV Vmean:      85.4 cm/s    Systemic VTI: 0.24 m MV Decel Time: 259 msec MV E velocity: 130.00 cm/s  Lamar Fitch  MD Electronically signed by Lamar Fitch MD Signature Date/Time: 12/10/2023/4:52:23 PM    Final      CT SCANS  CT CORONARY MORPH W/CTA COR W/SCORE 10/02/2021  Addendum 10/02/2021  9:43 AM ADDENDUM REPORT: 10/02/2021 09:41  CLINICAL DATA:  Aortic stenosis  EXAM: Cardiac TAVR CT  TECHNIQUE: The patient was scanned on a Siemens Force 192 slice scanner. A 120 kV retrospective scan was triggered in the descending thoracic aorta at 111 HU's. Gantry rotation speed was 270 msecs and collimation was .9 mm. No beta blockade or nitro were given. The 3D data set was reconstructed in 5% intervals of the R-R cycle. Systolic and diastolic phases were analyzed on a dedicated work station using MPR, MIP and VRT modes. The patient received 80 cc of contrast.  FINDINGS: Aortic Valve: Tri leaflet calcified with restricted leaflet motion Calcium  score > 5000  Aorta: Heavily calcified mildly dilated ascending thoracic aorta severe calcific atherosclerosis  Sinotubular Junction: 27 mm  Ascending Thoracic Aorta: 39 mm  Descending Thoracic Aorta: 24 mm  Sinus of Valsalva Measurements:  Non-coronary: 34.8 mm  Right - coronary: 33.6 mm  Left - coronary: 32.4 mm  Coronary Artery Height above Annulus:  Left Main: 11.7 mm above annulus  Right Coronary: 11.1 mm above annulus  Virtual Basal Annulus Measurements:  Maximum/Minimum Diameter: 27 mm x 19.4 mm  Perimeter: 77.6 mm  Area: 441 mm2 30% phase  Coronary Arteries: Sufficient height above annulus for deployment  Optimum Fluoroscopic Angle for Delivery: LAO 12 Caudal 12 degrees  IMPRESSION: 1. Calcified tri-leaflet AV with score >5,000  2.  Annular area of 441 mm2 suitable for a 26 mm Sapien 3 valve  3. Normal appearing bileaflet mechanical MVR with no pannus and good opening /closing angles  4.  Mixing artifact in LAA no delayed images done  5.  Coronary arteries sufficient height above annulus for  deployment  6. Optimum angiographic angle for deployment LAO 12 Caudal 12 degrees  7.  Patient appearing stent in LAD  8.  Pacing wires in RA/RV  9.  Dilated main PA 4.1 cm suggesting elevated PA systolic pressures  Maude Emmer   Electronically Signed By: Maude Emmer M.D. On: 10/02/2021 09:41  Narrative EXAM: OVER-READ INTERPRETATION  CT CHEST  The following report is an over-read performed by radiologist Dr. Toribio Aye of [facility] on 10/02/2021. This over-read does not include interpretation of cardiac or coronary anatomy or pathology. The coronary calcium  score/coronary CTA interpretation by the cardiologist is attached.  COMPARISON:  Chest CT 07/22/2021.  FINDINGS: Extracardiac findings will be described separately under dictation for contemporaneously obtained CTA chest, abdomen and pelvis.  IMPRESSION: Please see separate dictation for contemporaneously obtained CTA chest, abdomen and pelvis dated 10/02/2021 for full description of relevant extracardiac findings.  Electronically Signed: By: Toribio Aye M.D. On: 10/02/2021 09:14     ______________________________________________________________________________________________          Recent Labs: 09/12/2023: ALT 14; B Natriuretic Peptide 419.8 09/14/2023: Magnesium  2.2 09/18/2023: BUN 11; Creatinine, Ser 0.85; Potassium  3.9; Sodium 136 12/17/2023: Hemoglobin 12.9; Platelets 148  Recent Lipid Panel    Component Value Date/Time   CHOL  02/02/2008 0435    175        ATP III CLASSIFICATION:  <200     mg/dL   Desirable  799-760  mg/dL   Borderline High  >=759    mg/dL   High   TRIG 876 95/83/7990 0435   HDL 33 (L) 02/02/2008 0435   CHOLHDL 5.3 02/02/2008 0435   VLDL 25 02/02/2008 0435   LDLCALC (H) 02/02/2008 0435    117        Total Cholesterol/HDL:CHD Risk Coronary Heart Disease Risk Table                     Men   Women  1/2 Average Risk   3.4   3.3    Physical  Exam:    VS:  There were no vitals taken for this visit.    Wt Readings from Last 3 Encounters:  12/17/23 120 lb 12.8 oz (54.8 kg)  09/18/23 126 lb 3.2 oz (57.2 kg)  03/17/23 120 lb 3.2 oz (54.5 kg)     GEN: *** Well nourished, well developed in no acute distress HEENT: Normal NECK: No JVD; No carotid bruits LYMPHATICS: No lymphadenopathy CARDIAC: ***RRR, no murmurs, rubs, gallops RESPIRATORY:  Clear to auscultation without rales, wheezing or rhonchi  ABDOMEN: Soft, non-tender, non-distended MUSCULOSKELETAL:  No edema; No deformity  SKIN: Warm and dry NEUROLOGIC:  Alert and oriented x 3 PSYCHIATRIC:  Normal affect    Signed, Redell Leiter, MD  07/27/2024 7:35 AM    [facility]

## 2024-08-12 ENCOUNTER — Ambulatory Visit: Payer: Self-pay | Admitting: Cardiology

## 2024-08-14 NOTE — Telephone Encounter (Signed)
 Routing to EP scheduling team to get set up for next available with an APP. Patient is due for routine follow up in November.

## 2024-08-14 NOTE — Telephone Encounter (Signed)
 Call x1; attmepted to call patient to schedule - number not in service/lvmtcb w/ dtr [name] to schedule patient w/ EP APP for [name] [name] for 9 mo f/u (former [place] patient) & to address atrial arrhythmias and optimize BiV pacing on recent PPM transmission.

## 2024-08-17 NOTE — Telephone Encounter (Signed)
 Call x2; attmepted to call patient to schedule - number not in service/lvmtcb w/ dtr [name] to schedule patient w/ EP APP for [place] for 9 mo f/u (former [place] patient) & to address atrial arrhythmias and optimize BiV pacing on recent PPM transmission.

## 2024-08-22 NOTE — Telephone Encounter (Signed)
 Call x3; attmepted to call patient to schedule - number not in service/lvmtcb w/ dtr [name] to schedule patient w/ EP APP for [place] for 9 mo f/u (former [place] patient) & to address atrial arrhythmias and optimize BiV pacing on recent PPM transmission.

## 2024-08-24 ENCOUNTER — Encounter: Payer: Self-pay | Admitting: Emergency Medicine

## 2024-08-24 NOTE — Telephone Encounter (Signed)
 Certified letter sent after several attempts made to reach patient, were unsuccessful.

## 2024-09-01 NOTE — Telephone Encounter (Signed)
 Patient returned my call from the certified letter I sent to patient - she is scheduled to see Dr. Inocencio on 1/5 in the [facility] office for this follow up + to establish care. Delay in reaching patient was due to her having changed numbers, numbers in chart in have been updated.

## 2024-09-28 ENCOUNTER — Ambulatory Visit: Payer: Medicare Other

## 2024-09-28 DIAGNOSIS — I480 Paroxysmal atrial fibrillation: Secondary | ICD-10-CM | POA: Diagnosis not present

## 2024-09-29 LAB — CUP PACEART REMOTE DEVICE CHECK
Battery Remaining Longevity: 84 mo
Battery Voltage: 2.99 V
Brady Statistic AP VP Percent: 93.75 %
Brady Statistic AP VS Percent: 0.22 %
Brady Statistic AS VP Percent: 5.6 %
Brady Statistic AS VS Percent: 0.43 %
Brady Statistic RA Percent Paced: 94.16 %
Brady Statistic RV Percent Paced: 99.34 %
Date Time Interrogation Session: 20251211000419
Implantable Lead Connection Status: 753985
Implantable Lead Connection Status: 753985
Implantable Lead Connection Status: 753985
Implantable Lead Implant Date: 20090226
Implantable Lead Implant Date: 20090226
Implantable Lead Implant Date: 20241129
Implantable Lead Location: 753859
Implantable Lead Location: 753860
Implantable Lead Location: 753860
Implantable Lead Model: 3830
Implantable Lead Model: 5076
Implantable Lead Model: 5076
Implantable Pulse Generator Implant Date: 20241129
Lead Channel Impedance Value: 228 Ohm
Lead Channel Impedance Value: 285 Ohm
Lead Channel Impedance Value: 304 Ohm
Lead Channel Impedance Value: 323 Ohm
Lead Channel Impedance Value: 342 Ohm
Lead Channel Impedance Value: 361 Ohm
Lead Channel Impedance Value: 399 Ohm
Lead Channel Impedance Value: 418 Ohm
Lead Channel Impedance Value: 475 Ohm
Lead Channel Pacing Threshold Amplitude: 0.75 V
Lead Channel Pacing Threshold Amplitude: 0.875 V
Lead Channel Pacing Threshold Amplitude: 2 V
Lead Channel Pacing Threshold Pulse Width: 0.4 ms
Lead Channel Pacing Threshold Pulse Width: 0.4 ms
Lead Channel Pacing Threshold Pulse Width: 0.4 ms
Lead Channel Sensing Intrinsic Amplitude: 0.5 mV
Lead Channel Sensing Intrinsic Amplitude: 0.5 mV
Lead Channel Sensing Intrinsic Amplitude: 4.5 mV
Lead Channel Sensing Intrinsic Amplitude: 4.5 mV
Lead Channel Setting Pacing Amplitude: 1.5 V
Lead Channel Setting Pacing Amplitude: 1.5 V
Lead Channel Setting Pacing Amplitude: 2.5 V
Lead Channel Setting Pacing Pulse Width: 0.4 ms
Lead Channel Setting Pacing Pulse Width: 0.8 ms
Lead Channel Setting Sensing Sensitivity: 1.2 mV
Zone Setting Status: 755011
Zone Setting Status: 755011

## 2024-10-05 NOTE — Progress Notes (Signed)
 Remote PPM Transmission

## 2024-10-15 ENCOUNTER — Ambulatory Visit: Payer: Self-pay | Admitting: Cardiology

## 2024-10-22 NOTE — Progress Notes (Unsigned)
" °  Electrophysiology Office Note:   Date:  10/23/2024  ID:  [name] [name], DOB 1946-08-24, MRN [id number]  Primary Cardiologist: Redell Leiter, MD Primary Heart Failure: None Electrophysiologist: None      History of Present Illness:   [name] [name] is a 79 y.o. female with h/o atrial flutter post ablation, symptomatic bradycardia, chronic systolic and diastolic heart failure, CKD stage III, coronary artery disease post LAD stent, atrial fibrillation, aortic stenosis post TAVR, thoracic aortic aneurysm hypertension seen today for routine electrophysiology followup.   Discussed the use of AI scribe software for clinical note transcription with the patient, who gave verbal consent to proceed.  History of Present Illness [name] [name] is a 79 year old female who presents for a follow-up on her cardiac device.  She has a history of pacemaker implantation, initially placed in 2009. During the first pacemaker placement, she experienced a 'straight line' and was revived by the placement of the lead across her heart. She is satisfied with the care she received and was pleased to have the same physician involved in her recent procedure.  She inquires about the current pacemaker generator, which is an upgrade from the previous model.  She mentions a past hip replacement that has not healed properly and is reluctant to undergo another surgery.  she denies chest pain, palpitations, dyspnea, PND, orthopnea, nausea, vomiting, dizziness, syncope, edema, weight gain, or early satiety.   Review of systems complete and found to be negative unless listed in HPI.      EP Information / Studies Reviewed:    EKG is ordered today. Personal review as below.  EKG Interpretation Date/Time:  Monday October 23 2024 16:13:21 EST Ventricular Rate:  85 PR Interval:  138 QRS Duration:  172 QT Interval:  472 QTC Calculation: 561 R Axis:   112  Text Interpretation: AV dual-paced rhythm Abnormal ECG  When compared with ECG of 17-Dec-2023 14:25, No significant change was found Confirmed by Ronny Ruddell ([id number]) on 10/23/2024 4:20:12 PM   PPM Interrogation-  reviewed in detail today,  See PACEART report.  Device History: Medtronic dual-chamber with left bundle and right ventricular lead PPM implanted 12/15/2007 with device upgrade 09/17/2023 for Tachy-Brady syndrome  Risk Assessment/Calculations:           Physical Exam:   VS:  BP 118/74 (BP Location: Left Arm, Patient Position: Sitting, Cuff Size: Normal)   Pulse 85   Ht 4' 11 (1.499 m)   Wt 118 lb (53.5 kg)   SpO2 96%   BMI 23.83 kg/m    Wt Readings from Last 3 Encounters:  10/23/24 118 lb (53.5 kg)  12/17/23 120 lb 12.8 oz (54.8 kg)  09/18/23 126 lb 3.2 oz (57.2 kg)     GEN: Well nourished, well developed in no acute distress NECK: No JVD; No carotid bruits CARDIAC: Regular rate and rhythm, no murmurs, rubs, gallops RESPIRATORY:  Clear to auscultation without rales, wheezing or rhonchi  ABDOMEN: Soft, non-tender, non-distended EXTREMITIES:  No edema; No deformity   ASSESSMENT AND PLAN:    Tachy-Brady syndrome s/p Medtronic PPM  Normal PPM function See Pace Art report No changes today  2.  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  Ejection fraction is improved with left bundle area pacing.  3.  Valvular heart disease: Post mechanical MVR and TAVR.    Disposition:   Follow up with EP Team in 12 months  Signed, Akansha Wyche Gladis Norton, MD  "

## 2024-10-23 ENCOUNTER — Encounter: Payer: Self-pay | Admitting: Cardiology

## 2024-10-23 ENCOUNTER — Ambulatory Visit: Payer: Self-pay | Attending: Cardiology | Admitting: Cardiology

## 2024-10-23 VITALS — BP 118/74 | HR 85 | Ht 59.0 in | Wt 118.0 lb

## 2024-10-23 DIAGNOSIS — I495 Sick sinus syndrome: Secondary | ICD-10-CM

## 2024-10-23 DIAGNOSIS — Z950 Presence of cardiac pacemaker: Secondary | ICD-10-CM | POA: Diagnosis not present

## 2024-10-23 LAB — CUP PACEART INCLINIC DEVICE CHECK
Battery Remaining Longevity: 83 mo
Battery Voltage: 2.99 V
Brady Statistic AP VP Percent: 83.51 %
Brady Statistic AP VS Percent: 0.33 %
Brady Statistic AS VP Percent: 13.92 %
Brady Statistic AS VS Percent: 2.24 %
Brady Statistic RA Percent Paced: 83.14 %
Brady Statistic RV Percent Paced: 97.11 %
Date Time Interrogation Session: 20260105170400
Implantable Lead Connection Status: 753985
Implantable Lead Connection Status: 753985
Implantable Lead Connection Status: 753985
Implantable Lead Implant Date: 20090226
Implantable Lead Implant Date: 20090226
Implantable Lead Implant Date: 20241129
Implantable Lead Location: 753859
Implantable Lead Location: 753860
Implantable Lead Location: 753860
Implantable Lead Model: 3830
Implantable Lead Model: 5076
Implantable Lead Model: 5076
Implantable Pulse Generator Implant Date: 20241129
Lead Channel Impedance Value: 228 Ohm
Lead Channel Impedance Value: 285 Ohm
Lead Channel Impedance Value: 304 Ohm
Lead Channel Impedance Value: 323 Ohm
Lead Channel Impedance Value: 342 Ohm
Lead Channel Impedance Value: 361 Ohm
Lead Channel Impedance Value: 399 Ohm
Lead Channel Impedance Value: 418 Ohm
Lead Channel Impedance Value: 475 Ohm
Lead Channel Pacing Threshold Amplitude: 0.75 V
Lead Channel Pacing Threshold Amplitude: 0.875 V
Lead Channel Pacing Threshold Amplitude: 2.25 V
Lead Channel Pacing Threshold Pulse Width: 0.4 ms
Lead Channel Pacing Threshold Pulse Width: 0.4 ms
Lead Channel Pacing Threshold Pulse Width: 0.4 ms
Lead Channel Sensing Intrinsic Amplitude: 0.625 mV
Lead Channel Sensing Intrinsic Amplitude: 1.375 mV
Lead Channel Sensing Intrinsic Amplitude: 4.625 mV
Lead Channel Sensing Intrinsic Amplitude: 5.25 mV
Lead Channel Setting Pacing Amplitude: 1.5 V
Lead Channel Setting Pacing Amplitude: 1.5 V
Lead Channel Setting Pacing Amplitude: 2.5 V
Lead Channel Setting Pacing Pulse Width: 0.4 ms
Lead Channel Setting Pacing Pulse Width: 0.8 ms
Lead Channel Setting Sensing Sensitivity: 1.2 mV
Zone Setting Status: 755011
Zone Setting Status: 755011

## 2024-10-24 ENCOUNTER — Ambulatory Visit: Payer: Self-pay | Admitting: Cardiology

## 2024-10-26 ENCOUNTER — Other Ambulatory Visit (HOSPITAL_COMMUNITY): Payer: Self-pay
